# Patient Record
Sex: Male | Born: 1935 | Race: White | Hispanic: No | Marital: Married | Smoking: Former smoker
Health system: Southern US, Community
[De-identification: ages and names within clinical notes are randomized; demographics above are authoritative.]

## PROBLEM LIST (undated history)

## (undated) DIAGNOSIS — Z951 Presence of aortocoronary bypass graft: Secondary | ICD-10-CM

## (undated) DIAGNOSIS — I10 Essential (primary) hypertension: Secondary | ICD-10-CM

## (undated) DIAGNOSIS — E785 Hyperlipidemia, unspecified: Secondary | ICD-10-CM

## (undated) DIAGNOSIS — I2510 Atherosclerotic heart disease of native coronary artery without angina pectoris: Secondary | ICD-10-CM

## (undated) DIAGNOSIS — I4821 Permanent atrial fibrillation: Secondary | ICD-10-CM

## (undated) DIAGNOSIS — I350 Nonrheumatic aortic (valve) stenosis: Secondary | ICD-10-CM

## (undated) DIAGNOSIS — Z953 Presence of xenogenic heart valve: Principal | ICD-10-CM

## (undated) HISTORY — DX: Presence of aortocoronary bypass graft: Z95.1

## (undated) HISTORY — DX: Nonrheumatic aortic (valve) stenosis: I35.0

## (undated) HISTORY — DX: Atherosclerotic heart disease of native coronary artery without angina pectoris: I25.10

## (undated) HISTORY — DX: Permanent atrial fibrillation: I48.21

## (undated) HISTORY — DX: Hyperlipidemia, unspecified: E78.5

## (undated) HISTORY — DX: Essential (primary) hypertension: I10

## (undated) HISTORY — DX: Presence of xenogenic heart valve: Z95.3

---

## 2002-04-17 DIAGNOSIS — Z951 Presence of aortocoronary bypass graft: Secondary | ICD-10-CM

## 2002-04-17 DIAGNOSIS — I2510 Atherosclerotic heart disease of native coronary artery without angina pectoris: Secondary | ICD-10-CM

## 2002-04-17 HISTORY — DX: Atherosclerotic heart disease of native coronary artery without angina pectoris: I25.10

## 2002-04-17 HISTORY — DX: Presence of aortocoronary bypass graft: Z95.1

## 2002-12-11 ENCOUNTER — Encounter (INDEPENDENT_AMBULATORY_CARE_PROVIDER_SITE_OTHER): Payer: Self-pay | Admitting: Specialist

## 2002-12-11 ENCOUNTER — Encounter: Payer: Self-pay | Admitting: Emergency Medicine

## 2002-12-11 ENCOUNTER — Inpatient Hospital Stay (HOSPITAL_COMMUNITY): Admission: EM | Admit: 2002-12-11 | Discharge: 2002-12-26 | Payer: Self-pay | Admitting: *Deleted

## 2002-12-11 HISTORY — PX: CARDIAC CATHETERIZATION: SHX172

## 2002-12-15 ENCOUNTER — Encounter: Payer: Self-pay | Admitting: Cardiothoracic Surgery

## 2002-12-15 HISTORY — PX: CORONARY ARTERY BYPASS GRAFT: SHX141

## 2002-12-16 ENCOUNTER — Encounter: Payer: Self-pay | Admitting: Cardiothoracic Surgery

## 2002-12-17 ENCOUNTER — Encounter: Payer: Self-pay | Admitting: Cardiothoracic Surgery

## 2002-12-18 ENCOUNTER — Encounter: Payer: Self-pay | Admitting: Cardiothoracic Surgery

## 2003-02-16 ENCOUNTER — Encounter (HOSPITAL_COMMUNITY): Admission: RE | Admit: 2003-02-16 | Discharge: 2003-05-17 | Payer: Self-pay | Admitting: *Deleted

## 2003-03-14 ENCOUNTER — Emergency Department (HOSPITAL_COMMUNITY): Admission: EM | Admit: 2003-03-14 | Discharge: 2003-03-15 | Payer: Self-pay | Admitting: *Deleted

## 2004-12-12 ENCOUNTER — Emergency Department (HOSPITAL_COMMUNITY): Admission: EM | Admit: 2004-12-12 | Discharge: 2004-12-12 | Payer: Self-pay | Admitting: Emergency Medicine

## 2005-02-07 ENCOUNTER — Emergency Department (HOSPITAL_COMMUNITY): Admission: EM | Admit: 2005-02-07 | Discharge: 2005-02-07 | Payer: Self-pay | Admitting: Emergency Medicine

## 2009-01-03 ENCOUNTER — Emergency Department (HOSPITAL_COMMUNITY): Admission: EM | Admit: 2009-01-03 | Discharge: 2009-01-03 | Payer: Self-pay | Admitting: Emergency Medicine

## 2009-09-30 ENCOUNTER — Encounter: Admission: RE | Admit: 2009-09-30 | Discharge: 2009-09-30 | Payer: Self-pay | Admitting: Internal Medicine

## 2010-03-24 ENCOUNTER — Emergency Department (HOSPITAL_COMMUNITY): Admission: EM | Admit: 2010-03-24 | Discharge: 2009-08-25 | Payer: Self-pay | Admitting: Emergency Medicine

## 2010-07-05 LAB — POCT CARDIAC MARKERS
CKMB, poc: 1.8 ng/mL (ref 1.0–8.0)
CKMB, poc: 2.4 ng/mL (ref 1.0–8.0)
Myoglobin, poc: 75.8 ng/mL (ref 12–200)
Myoglobin, poc: 76.3 ng/mL (ref 12–200)
Troponin i, poc: <0.05 ng/mL (ref 0.00–0.09)
Troponin i, poc: <0.05 ng/mL (ref 0.00–0.09)

## 2010-07-05 LAB — DIFFERENTIAL
Basophils Absolute: 0 10*3/uL (ref 0.0–0.1)
Basophils Relative: 1 % (ref 0–1)
Eosinophils Absolute: 0.3 10*3/uL (ref 0.0–0.7)
Eosinophils Relative: 4 % (ref 0–5)
Lymphocytes Relative: 31 % (ref 12–46)
Lymphs Abs: 1.9 10*3/uL (ref 0.7–4.0)
Monocytes Absolute: 0.6 10*3/uL (ref 0.1–1.0)
Monocytes Relative: 9 % (ref 3–12)
Neutro Abs: 3.4 10*3/uL (ref 1.7–7.7)
Neutrophils Relative %: 55 % (ref 43–77)

## 2010-07-05 LAB — BASIC METABOLIC PANEL
BUN: 11 mg/dL (ref 6–23)
CO2: 27 mEq/L (ref 19–32)
Calcium: 9.3 mg/dL (ref 8.4–10.5)
Chloride: 91 mEq/L — ABNORMAL LOW (ref 96–112)
Creatinine, Ser: 0.89 mg/dL (ref 0.4–1.5)
GFR calc Af Amer: >60 mL/min (ref >60)
GFR calc non Af Amer: >60 mL/min (ref >60)
Glucose, Bld: 122 mg/dL — ABNORMAL HIGH (ref 70–99)
Potassium: 4.3 mEq/L (ref 3.5–5.1)
Sodium: 125 mEq/L — ABNORMAL LOW (ref 135–145)

## 2010-07-05 LAB — CBC
HCT: 38.5 % — ABNORMAL LOW (ref 39.0–52.0)
Hemoglobin: 13.1 g/dL (ref 13.0–17.0)
MCHC: 34.2 g/dL (ref 30.0–36.0)
MCV: 98.1 fL (ref 78.0–100.0)
Platelets: 160 10*3/uL (ref 150–400)
RBC: 3.93 MIL/uL — ABNORMAL LOW (ref 4.22–5.81)
RDW: 12.2 % (ref 11.5–15.5)
WBC: 6.3 10*3/uL (ref 4.0–10.5)

## 2010-07-05 LAB — PROTIME-INR
INR: 0.99 (ref 0.00–1.49)
Prothrombin Time: 13 seconds (ref 11.6–15.2)

## 2010-07-22 LAB — COMPREHENSIVE METABOLIC PANEL
ALT: 23 U/L (ref 0–53)
AST: 34 U/L (ref 0–37)
Albumin: 4.1 g/dL (ref 3.5–5.2)
Alkaline Phosphatase: 69 U/L (ref 39–117)
BUN: 8 mg/dL (ref 6–23)
CO2: 26 mEq/L (ref 19–32)
Calcium: 9.6 mg/dL (ref 8.4–10.5)
Chloride: 94 mEq/L — ABNORMAL LOW (ref 96–112)
Creatinine, Ser: 0.75 mg/dL (ref 0.4–1.5)
GFR calc Af Amer: >60 mL/min (ref >60)
GFR calc non Af Amer: >60 mL/min (ref >60)
Glucose, Bld: 113 mg/dL — ABNORMAL HIGH (ref 70–99)
Potassium: 4.1 mEq/L (ref 3.5–5.1)
Sodium: 128 mEq/L — ABNORMAL LOW (ref 135–145)
Total Bilirubin: 1.1 mg/dL (ref 0.3–1.2)
Total Protein: 7.1 g/dL (ref 6.0–8.3)

## 2010-07-22 LAB — CBC
HCT: 40.7 % (ref 39.0–52.0)
Hemoglobin: 14 g/dL (ref 13.0–17.0)
MCHC: 34.5 g/dL (ref 30.0–36.0)
MCV: 97.8 fL (ref 78.0–100.0)
Platelets: 189 10*3/uL (ref 150–400)
RBC: 4.16 MIL/uL — ABNORMAL LOW (ref 4.22–5.81)
RDW: 12.4 % (ref 11.5–15.5)
WBC: 8.8 10*3/uL (ref 4.0–10.5)

## 2010-07-22 LAB — POCT CARDIAC MARKERS
CKMB, poc: 4.8 ng/mL (ref 1.0–8.0)
CKMB, poc: 6.3 ng/mL (ref 1.0–8.0)
Myoglobin, poc: 114 ng/mL (ref 12–200)
Myoglobin, poc: 133 ng/mL (ref 12–200)
Troponin i, poc: <0.05 ng/mL (ref 0.00–0.09)
Troponin i, poc: <0.05 ng/mL (ref 0.00–0.09)

## 2010-07-22 LAB — URINALYSIS, ROUTINE W REFLEX MICROSCOPIC
Bilirubin Urine: NEGATIVE
Glucose, UA: NEGATIVE mg/dL
Hgb urine dipstick: NEGATIVE
Ketones, ur: NEGATIVE mg/dL
Nitrite: NEGATIVE
Protein, ur: NEGATIVE mg/dL
Specific Gravity, Urine: 1.004 — ABNORMAL LOW (ref 1.005–1.030)
Urobilinogen, UA: 0.2 mg/dL (ref 0.0–1.0)
pH: 7 (ref 5.0–8.0)

## 2010-07-22 LAB — DIFFERENTIAL
Basophils Absolute: 0 10*3/uL (ref 0.0–0.1)
Basophils Relative: 1 % (ref 0–1)
Eosinophils Absolute: 0.1 10*3/uL (ref 0.0–0.7)
Eosinophils Relative: 1 % (ref 0–5)
Lymphocytes Relative: 15 % (ref 12–46)
Lymphs Abs: 1.3 10*3/uL (ref 0.7–4.0)
Monocytes Absolute: 0.5 10*3/uL (ref 0.1–1.0)
Monocytes Relative: 6 % (ref 3–12)
Neutro Abs: 6.9 10*3/uL (ref 1.7–7.7)
Neutrophils Relative %: 78 % — ABNORMAL HIGH (ref 43–77)

## 2010-07-22 LAB — BRAIN NATRIURETIC PEPTIDE: Pro B Natriuretic peptide (BNP): 67 pg/mL (ref 0.0–100.0)

## 2010-09-02 NOTE — Op Note (Signed)
NAME:  Richard Simon, Richard Simon                              ACCOUNT NO.:  [id number]   MEDICAL RECORD NO.:  [id number]                   PATIENT TYPE:  INP   LOCATION:  2001                                 FACILITY:  [facility]   PHYSICIAN:  Gwenith Daily. Tyrone Sage, M.D.            DATE OF BIRTH:  January 12, 1930   DATE OF PROCEDURE:  12/15/2002  DATE OF DISCHARGE:                                 OPERATIVE REPORT   PREOPERATIVE DIAGNOSES:  Coronary occlusive disease.   POSTOPERATIVE DIAGNOSES:  Coronary occlusive disease.   OPERATION PERFORMED:  Coronary artery bypass grafting times four with a left  internal mammary artery to the left anterior descending coronary artery,  reversed saphenous vein graft to the diagonal coronary artery, sequential  reversed saphenous vein graft to the first obtuse marginal and distal  circumflex with right Endo vein harvesting.   SURGEON:  Gwenith Daily. Tyrone Sage, M.D.   ASSISTANT:  1. Salvatore Decent. Cornelius Moras, M.D.  2. Rowe Clack, P.A.-C.   ANESTHESIA:  General.   INDICATIONS FOR PROCEDURE:  The patient is a 77 year old male who has been  healthy until he had the onset of chest discomfort after heavy physical work  with pain that subsided and then returned. He sought medical attention.  EKG  showed ischemic changes and he was admitted through the emergency room.  Troponins were elevated.  The patient underwent cardiac catheterization by  Aram Candela. Tysinger, M.D., which demonstrated significant three-vessel disease  with a small nondominant right system that was totally occluded, dominant  left system with 70 to 80% stenosis of the first obtuse marginal and disease  in the distal circumflex which also supplied a small posterior descending  branch.  In addition, he had a complex lesion involving the LAD and diagonal  of greater than 80%.  Because of the patient's significant three-vessel  disease, coronary artery bypass grafting was recommended.  The patient  agreed and signed  informed consent.   DESCRIPTION OF PROCEDURE:  With Swann-Ganz and arterial line monitors in  place, the patient underwent general endotracheal anesthesia without  incident.  The skin of the chest and legs was prepped with Betadine and  draped in the usual sterile manner.  Using a [employer] Endo vein harvesting  system, vein was harvested from the right thigh and was of good quality and  caliber.  A median sternotomy was performed.  The left internal mammary  artery was dissected down as a pedicle graft.  The distal artery was divided  and had good free flow.  The pericardium was opened.  Overall ventricular  function appeared preserved.  The patient was systemically heparinized.  The  ascending aorta and the right atrium were cannulated in the aortic root.  A  bent cardioplegia needle was introduced into the ascending aorta.  The  patient was placed on cardiopulmonary bypass at 2.4L per minute per meter  squared.  Sites for  anastomosis were selected and dissected out of the  epicardium.  The patient's body temperature was cooled to 30 degrees.  An  aortic crossclamp was applied.  of cold blood potassium cardioplegia  was administered with rapid diastolic arrest of the heart.  Myocardial  septal temperature was monitored throughout the crossclamp period.   Attention was turned first to the obtuse marginal vessel which was opened  and admitted a 1.5 mm probe. The vessel was just barely 1.5 mm in size.  Using a diamond type side-to-side anastomosis was carried out.  The distal  extent of the same vein was then carried to the distal circumflex branch  which was the larger of the more distal branches. The posterior descending  was identified but was smaller.  Using a running 7-0 Prolene, distal  anastomosis was performed.  Attention was then turned to the diagonal  coronary artery which was a 1.5 mm vessel.  Using a running 7-0 Prolene,  distal anastomosis was performed with a segment of  reversed saphenous vein  graft.  Attention was then turned to the left anterior descending coronary  artery, which was approximately 1.3 to 1.4 mm size distally.  Using a  running 8-0 Prolene, the left internal mammary artery was anastomosed to a  left anterior descending coronary artery.  With release of the Edwards  bulldog on the mammary artery, there was appropriate rise in myocardial  septal temperature.  Aortic cross-clamp was removed.  Total cross-clamp time  was 64 minutes.  The patient required electrical defibrillation to return to  a sinus rhythm.  A partial occlusion clamp was placed on the ascending  aorta.  Two punch aortotomies were performed and each of the two vein grafts  were anastomosed to the ascending aorta.  Air was evacuated from the grafts  and the partial occlusion clamp was removed.  The sites of anastomosis were  inspected and were free of bleeding.  The patient was then ventilated and  weaned from cardiopulmonary bypass without difficulty.  He remained  hemodynamically stable, was decannulated in the usual fashion.  Protamine  sulfate was administered.  With the operative field hemostatic, two atrial  and two ventricular pacing wires were applied.  Graft markers were applied.  A left pleural tube and two mediastinal tubes were left in place.  Sternum  was closed with #6 stainless steel wire.  Fascia closed with interrupted 0  Vicryl, running 3-0 Vicryl in the subcutaneous tissues and 4-0 subcuticular  stitch in the skin edges.  Dry dressings were applied.  Sponge and needle  counts were reported as correct at the completion of the procedure.  The  patient tolerated the procedure without obvious complication and was  transferred to the surgical intensive care unit for further postoperative  care.                                                 Gwenith Daily Tyrone Sage, M.D.    Tyson Babinski  D:  12/16/2002  T:  12/17/2002  Job:  [id number]  cc:   Aram Candela. Aleen Campi,  M.D.  [address]  [place]  [place] [address]  Fax: [phone]

## 2010-09-02 NOTE — Discharge Summary (Signed)
NAME:  Richard Simon, Richard Simon                              ACCOUNT NO.:  [id number]   MEDICAL RECORD NO.:  [id number]                   PATIENT TYPE:  INP   LOCATION:  2001                                 FACILITY:  [facility]   PHYSICIAN:  Gwenith Daily. Tyrone Sage, M.D.            DATE OF BIRTH:  Jan 28, 1930   DATE OF ADMISSION:  12/11/2002  DATE OF DISCHARGE:  12/26/2002                                 DISCHARGE SUMMARY   HISTORY OF PRESENT ILLNESS:  This is a 77 year old male with a history of  hypertension and hypercholesterolemia, but no prior history of coronary  artery disease, who presented to his primary care physician's office after  having experienced and episode of midsternal chest tightness.  This event  followed a late evening heavy meal.  He has stated that it was accompanied  with shortness of breath, some diaphoresis, and lasted about an hour and  subsided.  After approximately one hour, the patient then felt back to  normal.  He presented to his primary care physician and was found to have  atrial fibrillation with a rapid rate and was subsequently transferred to  [facility]  He was pain-free, hemodynamically stable, and in no  acute distress on arrival.  EKG showed atrial fibrillation with rapid  ventricular response, no evidence of acute ST segment elevations, and no  evidence of a prior infarction.  The patient was found to have elevation in  his troponin I to 4.23 and CK to 819 with an MB band fraction of 42.7.  He  was started on a Cardizem drip and a cardiology consultation was obtained  with Dr. Aleen Campi.  He was felt to require admission for further evaluation  and treatment.   PAST MEDICAL HISTORY:  1. Hypertension.  2. Hypercholesterolemia.  3. No prior surgical history.   MEDICATIONS ON ADMISSION:  1. Atenolol 50 mg daily.  2. Prinivil 40 mg daily.  3. Lipitor 20 mg daily.  4. Aspirin 81 mg daily.  5. Hydrochlorothiazide 25 mg daily.  6. Vitamin E 400  international units daily.  7. B complex vitamins one daily.   ALLERGIES:  None known.   FAMILY HISTORY:  Please see the history and physical done at the time of  admission.   SOCIAL HISTORY:  Please see the history and physical done at the time of  admission.   REVIEW OF SYSTEMS:  Please see the history and physical done at the time of  admission.   PHYSICAL EXAMINATION:  Please see the history and physical done at the time  of admission.   HOSPITAL COURSE:  The patient was admitted on December 11, 2002.  He was taken  to the catheterization laboratory by Dr. Aleen Campi where he was found to have  severe three-vessel coronary artery disease, including a 99% right coronary  artery stenosis, a 99% obtuse marginal, a 90% mid circumflex, a 90% distal  circumflex, an 80% mid LAD, and a 50% distal LAD lesion.  The patient had a  left renal artery stenosis also of 70-80%.  The ejection fraction was noted  to be 70%.  Due to these findings, a surgical consultation was obtained with  Ramon Dredge B. Tyrone Sage, M.D., who evaluated the patient and studies and agreed  with recommendations to proceed with surgical revascularization.   PROCEDURE:  On December 15, 2002, the patient was taken to the operating room  where he underwent the following procedure:  Coronary artery bypass grafting  x 4.  The patient tolerated the procedure well and was taken to the surgical  intensive care unit in stable condition.   POSTOPERATIVE HOSPITAL COURSE:  The patient has done well.  He was extubated  neurologically intact.  All routine lines, monitors, and drainage devices  were discontinued in a standard fashion.  The patient did have a mild  postoperative anemia.  The primary difficulty postoperatively the patient  had was postoperative atrial fibrillation.  The patient did chemically  cardiovert to a normal sinus rhythm for a short time, however, did  eventually return to intermittent atrial fibrillation and this  prompted the  initiation of anticoagulation therapy.  The patient did subsequently  maintain an normal sinus rhythm at the time of discharge.  The patient  tolerated routine advancement in activities commiserative for level of  postoperative convalescence using cardiac rehabilitation phase 1 modalities.  The patient tolerated a gentle diuresis.  The patient's incisions were  healing well without signs of infection.  The patient was tolerating diet.  Oxygen had been weaned and maintained good saturations on room air.  Overall  the patient was deemed to be an acceptable candidate for discharge on  December 26, 2002.   CONDITION ON DISCHARGE:  Stable and improving.   MEDICATIONS ON DISCHARGE:  1. Coumadin 5 mg daily.  2. Coated aspirin 81 mg daily.  3. Amiodarone 200 mg twice daily.  4. Prinivil as preoperatively.  5. Lanoxin 0.25 mg daily.  6. Cardizem CD 360 mg daily.  7. Lipitor 20 mg daily.  8. Atenolol 50 mg daily for pain.  9. Tylox one or two every four to six hours as needed for pain.   INSTRUCTIONS:  The patient received written instructions in regard to  medications, activity, diet, wound care, and followup.   FOLLOWUP:  Dr. Aleen Campi in two weeks.  PT INR drawn on December 29, 2002,  at Dr. Adelene Idler office.  Additionally, Dr. Tyrone Sage will see the patient  on Thursday, January 16, 2003, at 11:15 a.m.   FINAL DIAGNOSES:  1. Severe coronary artery disease as described above.  2. Postoperative atrial fibrillation.  3. Hypertension.  4. Hyperlipidemia.      Rowe Clack, P.A.-C.                    Gwenith Daily Tyrone Sage, M.D.    Sherryll Burger  D:  03/24/2003  T:  03/25/2003  Job:  [id number]   cc:   Georgianne Fick, M.D.  [address]  [place]  [place] [address]  Fax: [phone]

## 2010-09-02 NOTE — H&P (Signed)
NAME:  Richard Simon, Richard Simon                              ACCOUNT NO.:  [id number]   MEDICAL RECORD NO.:  [id number]                   PATIENT TYPE:  INP   LOCATION:  2926                                 FACILITY:  [facility]   PHYSICIAN:  Hettie Holstein, D.O.                 DATE OF BIRTH:  12/17/1929   DATE OF ADMISSION:  12/11/2002  DATE OF DISCHARGE:                                HISTORY & PHYSICAL   ENCOMPASS HOSPITALIST:  Hettie Holstein, D.O.   PRIMARY CARE PHYSICIAN:  Georgianne Fick, M.D. with [facility]   CHIEF COMPLAINT:  Atrial fibrillation/chest pain.   HISTORY OF PRESENT ILLNESS:  This is a 77 year old pleasant Caucasian male  with a history of hypertension and hypercholesterolemia, with no prior known  history of coronary artery disease who presented to his primary care  physician's office today after having experienced midsternal chest tightness  this prior evening approximately 10 p.m. following an late evening heavy  meal.  He states that this was accompanied with some shortness of breath, a  little diaphoresis, lasted about an hour in duration, and subsided.  He  stated that after an hour he felt back to normal.  He had checked his blood  pressure secondary to some irregularity with regard to the blood pressure  reading.  His son prompted him to see his primary care physician this  morning.  At his primary care physician's office he was found to have atrial  fibrillation with a rapid rate and was subsequently transferred to [facility].  He was pain free, hemodynamically stable, and in no acute  distress on arrival.  EKG was performed, and in fact it was atrial  fibrillation with rapid ventricular rate, no evidence of acute ST segment  elevations, and no evidence of prior infarct was noted on the EKG.  In any  event, in the emergency department he was evaluated, cardiac enzymes  returned elevated troponin.  This was drawn at approximately 1 p.m.,  and  troponin-I was 4.23, and CK total was 819, and MB fraction was 42.7.  He was  found to have a rapid heart rate and he was started on a Cardizem drip by  the emergency department as well as heparin, and Dr. Aleen Campi from  [facility] was notified of the lab findings as well as  the EKG, and the patient was said to be transferred to a telemetry floor.   PAST MEDICAL HISTORY:  1. Hypertension.  2. Hypercholesterolemia.  3. States that he had not followed with a physician for many years up until     2000, when he began to see Dr. Nicholos Johns.   PAST SURGICAL HISTORY:  The patient denies.   MEDICATIONS:  1. Atenolol 50 mg p.o. q.d.  2. Prinivil 40 mg p.o. q.d.  3. Lipitor 20 mg p.o. q.d.  4. Aspirin  81 mg p.o. q.d.  5. Hydrochlorothiazide 25 mg p.o. q.d.  6. Vitamin E 400 IU q. day.  7. B complex vitamin q. day.   ALLERGIES:  No known drug allergies.   SOCIAL HISTORY:  He quit smoking over 20 years ago.  Lives with his wife.  Is very active and travels regularly.  Exercises.   FAMILY HISTORY:  Significant for a father with severe rheumatoid arthritis,  otherwise, no other family history that was pertinent.   REVIEW OF SYSTEMS:  GENERAL:  The patient denied any recent weight loss.  Denied fevers, chills, night sweats, or change in appetite.  HEENT:  Denied  any visual, auditory, olfactory, or visual disturbances.  CHEST:  The  patient denies any further palpitations.  CARDIOVASCULAR:  Noted in the  history of presenting illness, however, denied any dyspnea on exertion,  orthopnea, PND, lower extremity edema.  PULMONARY:  The patient denied any  history of asthma, history of lung disease, history of hemoptysis or cough  productive of sputum.  GASTROINTESTINAL:  The patient denied any nausea,  vomiting, diarrhea, hematemesis, hematochezia, melena, coffee ground emesis,  or abdominal complaints.  GENITOURINARY:  The patient denied any dysuria,  discharge.   Denied any history of prostatic hypertrophy.  HEMATOLOGIC:  The  patient denies any prior history of anemia or bleeding disorders.  INFECTIOUS DISEASE:  No fevers, no chills, no night sweats.  MUSCULOSKELETAL:  The patient denies joint aches, joint pains, joint  swelling, lower extremity swelling or pain, or back pain.   PHYSICAL EXAMINATION:  VITAL SIGNS:  The patient's blood pressure was 135/87  supine, his heart rate was irregular at 100 to 120.  Telemetry revealed  occasional runs of VT, though the patient was asymptomatic with these  episodes with blood pressure stable.  The patient was afebrile with an oral  temperature of 98.5.  GENERAL:  He appeared comfortable.  In no acute distress.  Well-developed  Caucasian male in no respiratory distress.  HEENT:  His head was normocephalic, atraumatic.  Extraocular movements were  intact.  Pupils equal and reactive to light.  No pallor.  The patient was  nonicteric.  NECK:  Supple, no tenderness, no palpable thyromegaly, no tracheal  deviation.  CARDIOVASCULAR:  Heart was irregular, no murmurs could be appreciated to  auscultation.  PULMONARY:  Faint crackles noted in the bases bilaterally.  No wheeze noted.  ABDOMEN:  Soft, nontender, no palpable organomegaly, no palpable tenderness.  EXTREMITIES:  No edema.  Peripheral pulses were strong.  NEUROLOGIC:  No focal neurologic deficits noted.   LABORATORY DATA:  WBC 9.9, hemoglobin 14.5, hematocrit 41.9, platelet count  of 199.  Sodium of 131, potassium 3.8, chloride 97, CO2 26, glucose 127, BUN  10, creatinine 0.9, calcium 9.5, albumin 3.9, AST 56, ALT 31, alk phos 71,  bilirubin total 1.1.  CK total of 819, CK-MB 42.7, troponin-I of 4.23.  EKG  once again revealed rapid atrial fibrillation with no evidence of ST segment  elevation.  Chest x-ray revealed no evidence of mediastinal widening and no  evidence of pulmonary edema.   IMPRESSION AND PLAN: 1. Acute coronary syndrome, perhaps  rate related with underlying coronary     artery disease.  Perhaps acute ischemic event.  We will place Mr. Gladish     on heparin.  Cardiac nomogram.  Start him on aspirin, beta blocker,     continue his ACE inhibitor, continue his Lipitor, and consult cardiology,     and monitor  him on a telemetry floor.  We are going to attempt to control     his rate with beta blockers, and if needed, a Cardizem drip until his     rate can be better controlled.  We will watch him closely     hemodynamically, and follow his serial cardiac enzymes.  2. Rapid atrial fibrillation.  As above.  We will attempt rate control and     anticoagulation for now.  We will increase his daily atenolol.  3. Hypertension.  The patient's blood pressure is controlled at this time.     We will continue his current regimen from home.  4. Hypercholesterolemia.  We will continue his statin.  5. Hyponatremia.  Repeat BMP in the morning, follow him clinically, and     perhaps fluid restrict if this continues to be persistent.  At present,     he is hemodynamically stable and chest pain free.  I have notified Dr.     Adelene Idler office and awaiting his input.                                                Hettie Holstein, D.O.    ESS/MEDQ  D:  12/11/2002  T:  12/12/2002  Job:  [id number]

## 2010-12-27 ENCOUNTER — Inpatient Hospital Stay (HOSPITAL_COMMUNITY)
Admission: EM | Admit: 2010-12-27 | Discharge: 2010-12-29 | DRG: 310 | Disposition: A | Discharge status: Home / Self Care | Payer: Medicare Other | Source: Ambulatory Visit | Attending: Cardiovascular Disease | Admitting: Cardiovascular Disease

## 2010-12-27 ENCOUNTER — Emergency Department (HOSPITAL_COMMUNITY): Payer: Medicare Other

## 2010-12-27 DIAGNOSIS — Z87891 Personal history of nicotine dependence: Secondary | ICD-10-CM

## 2010-12-27 DIAGNOSIS — I080 Rheumatic disorders of both mitral and aortic valves: Secondary | ICD-10-CM | POA: Diagnosis present

## 2010-12-27 DIAGNOSIS — Z7982 Long term (current) use of aspirin: Secondary | ICD-10-CM

## 2010-12-27 DIAGNOSIS — E785 Hyperlipidemia, unspecified: Secondary | ICD-10-CM | POA: Diagnosis present

## 2010-12-27 DIAGNOSIS — I2510 Atherosclerotic heart disease of native coronary artery without angina pectoris: Secondary | ICD-10-CM | POA: Diagnosis present

## 2010-12-27 DIAGNOSIS — I4891 Unspecified atrial fibrillation: Principal | ICD-10-CM | POA: Diagnosis present

## 2010-12-27 DIAGNOSIS — Z951 Presence of aortocoronary bypass graft: Secondary | ICD-10-CM

## 2010-12-27 DIAGNOSIS — I10 Essential (primary) hypertension: Secondary | ICD-10-CM | POA: Diagnosis present

## 2010-12-27 LAB — CBC
HCT: 40.2 % (ref 39.0–52.0)
Hemoglobin: 14.4 g/dL (ref 13.0–17.0)
MCH: 32.8 pg (ref 26.0–34.0)
MCHC: 35.8 g/dL (ref 30.0–36.0)
MCV: 91.6 fL (ref 78.0–100.0)
Platelets: 192 10*3/uL (ref 150–400)
RBC: 4.39 MIL/uL (ref 4.22–5.81)
RDW: 12.4 % (ref 11.5–15.5)
WBC: 8.4 10*3/uL (ref 4.0–10.5)

## 2010-12-27 LAB — DIFFERENTIAL
Basophils Absolute: 0.1 10*3/uL (ref 0.0–0.1)
Basophils Relative: 1 % (ref 0–1)
Eosinophils Absolute: 0.3 10*3/uL (ref 0.0–0.7)
Eosinophils Relative: 4 % (ref 0–5)
Lymphocytes Relative: 36 % (ref 12–46)
Lymphs Abs: 3 10*3/uL (ref 0.7–4.0)
Monocytes Absolute: 0.8 10*3/uL (ref 0.1–1.0)
Monocytes Relative: 9 % (ref 3–12)
Neutro Abs: 4.2 10*3/uL (ref 1.7–7.7)
Neutrophils Relative %: 51 % (ref 43–77)

## 2010-12-27 LAB — COMPREHENSIVE METABOLIC PANEL
ALT: 15 U/L (ref 0–53)
AST: 19 U/L (ref 0–37)
Albumin: 3.9 g/dL (ref 3.5–5.2)
Alkaline Phosphatase: 65 U/L (ref 39–117)
CO2: 26 mEq/L (ref 19–32)
Chloride: 94 mEq/L — ABNORMAL LOW (ref 96–112)
GFR calc non Af Amer: >60 mL/min (ref >60)
Potassium: 4.1 mEq/L (ref 3.5–5.1)
Total Bilirubin: 0.5 mg/dL (ref 0.3–1.2)

## 2010-12-27 LAB — APTT: aPTT: 35 seconds (ref 24–37)

## 2010-12-27 LAB — PROTIME-INR: INR: 1.05 (ref 0.00–1.49)

## 2010-12-28 LAB — URINALYSIS, ROUTINE W REFLEX MICROSCOPIC
Glucose, UA: NEGATIVE mg/dL
Hgb urine dipstick: NEGATIVE
Leukocytes, UA: NEGATIVE
Protein, ur: NEGATIVE mg/dL
Specific Gravity, Urine: 1.008 (ref 1.005–1.030)
pH: 7 (ref 5.0–8.0)

## 2010-12-28 LAB — LIPID PANEL
Cholesterol: 150 mg/dL (ref 0–200)
Total CHOL/HDL Ratio: 2.7 RATIO
Triglycerides: 45 mg/dL (ref <150)

## 2010-12-28 LAB — CBC
Hemoglobin: 13.6 g/dL (ref 13.0–17.0)
MCHC: 35.3 g/dL (ref 30.0–36.0)
RBC: 4.23 MIL/uL (ref 4.22–5.81)
WBC: 9.3 10*3/uL (ref 4.0–10.5)

## 2010-12-28 LAB — TSH: TSH: 1.997 u[IU]/mL (ref 0.350–4.500)

## 2010-12-28 LAB — MRSA PCR SCREENING: MRSA by PCR: NEGATIVE

## 2010-12-28 LAB — HEPARIN LEVEL (UNFRACTIONATED): Heparin Unfractionated: 0.35 IU/mL (ref 0.30–0.70)

## 2010-12-29 LAB — CBC
Hemoglobin: 12.7 g/dL — ABNORMAL LOW (ref 13.0–17.0)
MCV: 92 fL (ref 78.0–100.0)
Platelets: 179 10*3/uL (ref 150–400)
RBC: 4.01 MIL/uL — ABNORMAL LOW (ref 4.22–5.81)
WBC: 6.8 10*3/uL (ref 4.0–10.5)

## 2010-12-29 LAB — HEPARIN LEVEL (UNFRACTIONATED): Heparin Unfractionated: 0.42 IU/mL (ref 0.30–0.70)

## 2010-12-29 LAB — BASIC METABOLIC PANEL
BUN: 11 mg/dL (ref 6–23)
Chloride: 100 mEq/L (ref 96–112)
GFR calc Af Amer: >60 mL/min (ref >60)
Potassium: 4.1 mEq/L (ref 3.5–5.1)

## 2011-01-15 NOTE — Discharge Summary (Signed)
NAME:  Richard Simon, Richard Simon                  ACCOUNT NO.:  [id number]  MEDICAL RECORD NO.:  [id number]  LOCATION:  3702                         FACILITY:  [facility]  PHYSICIAN:  Nanetta Batty, M.D.   DATE OF BIRTH:  21-Nov-1929  DATE OF ADMISSION:  12/27/2010 DATE OF DISCHARGE:  12/29/2010                              DISCHARGE SUMMARY   DISCHARGE DIAGNOSES: 1. Atrial fibrillation with rapid ventricular response, now rate     controlled, atrial fibrillation with medications,and the patient     prefers not to undergo cardioversion or ablation at this time. 2. History of coronary artery disease with bypass grafting x4 in 2004. 3. Remote paroxysmal atrial fibrillation after Richard Simon bypass grafting. 4. Anticoagulation with Pradaxa. 5. Hypertension treated. 6. Dyslipidemia treated. 7. Grade 1 diastolic dysfunction on 2D echocardiogram. 8. Moderate aortic stenosis and moderate mitral regurgitation.  DISCHARGE CONDITION:  Improved and stable.  DISCHARGE MEDICATIONS:  See medication reconciliation sheet.  We added Pradaxa and Cardizem.  DISCHARGE INSTRUCTIONS: 1. Activity as tolerated.  Increase activity slowly. 2. Low-sodium heart-healthy diet. 3. Call if any bleeding or feeling faint. 4. Follow up with Dr. Herbie Baltimore at [facility], [phone] on January 16, 2011 and 4:15 p.m.  HOSPITAL COURSE:  An 77 year old gentleman who had previously been followed by Dr. Aleen Campi with bypass grafting in 2004 with LIMA to the LAD, vein graft to the diagonal, vein graft to the OM and circumflex sequentially.  He did have postop AFib at that time and he had been on Coumadin for a while, but that has been discontinued some time ago.  The patient came into the emergency room on December 27, 2010, because Richard Simon heart rate was high at times up to 130-140 that was intermittent and had been going on 4-5 days prior to admission.  In the emergency room, he was found to be in AFib with heart rate of  139.  IV diltiazem was given. He denied chest pain or shortness of breath.  Other history includes hypertension, dyslipidemia.  He was admitted to [facility].  Negative myocardial infarction.  Rate was controlled with IV Cardizem and by December 29, 2010, Richard Simon IV Cardizem was changed to p.o. Cardizem.  He will ambulate in the hallway if heart rate stable.  He can be discharged home.  He is having some slower heart rate to the combination of beta- blocker and Cardizem and the AFib.  Please note, the patient did not wish to be on Coumadin again.  The patient will need a stress test as an outpatient.  LABORATORY DATA:  Chemistry at discharge, sodium 135, potassium 4.1, chloride 100, CO2 of 25, glucose 115, BUN 11, creatinine 0.82. Hemoglobin 12.7, hematocrit 36.9, WBC 6.8, platelets 179.  Hemoglobin A1c 5.7.  TSH 1.997.  Magnesium level is 2.  Total cholesterol 150, triglycerides 45, HDL 56, LDL 85.  UA was clear and cardiac enzymes were negative with a CK 193, MB 5.3, and troponin I less than 0.30.  Also please note that Richard Simon LFTs were normal.  RADIOLOGY:  Chest x-ray, mild peribronchial thickening noted.  Lungs otherwise clear.  2D echo as previously reported.  EF  was 55-60% with a grade 1 diastolic dysfunction, moderate aortic stenosis with a valve area of 1.180 cm2.  By VTI, mitral valve had mild-to-moderate regurgitation.  Left atrium was moderately to severely dilated, and the right atrium was mildly to moderately dilated.  EKGs revealed AFib with rapid ventricular response, now rate controlled. Initial EKG actually has a heart rate of 157.  The patient will follow up as instructed.     Darcella Gasman. Annie Paras, N.P.   ______________________________ Nanetta Batty, M.D.    LRI/MEDQ  D:  12/29/2010  T:  12/29/2010  Job:  [id number]  cc:   Georgianne Fick, M.D.  Electronically Signed by Nada Boozer N.P. on 12/30/2010 04:13:55 PM Electronically Signed by Nanetta Batty M.D. on  01/15/2011 11:44:12 AM

## 2011-01-15 NOTE — H&P (Signed)
NAME:  Richard Simon, Richard Simon                  ACCOUNT NO.:  [id number]  MEDICAL RECORD NO.:  [id number]  LOCATION:  [facility]                         FACILITY:  [facility]  PHYSICIAN:  Nanetta Batty, M.D.   DATE OF BIRTH:  [birth year]  DATE OF ADMISSION:  12/27/2010 DATE OF DISCHARGE:                             HISTORY & PHYSICAL   CHIEF COMPLAINT:  Rapid heart rate.  HISTORY OF PRESENT ILLNESS:  Richard Simon is a 77 year old male who used to be followed by Dr. Aleen Campi.  He had bypass grafting in August 2004 x4 with LIMA to the LAD and SVG to diagonal, and SVG to the OM and circumflex sequentially.  He had postoperative paroxysmal atrial fibrillation.  He was on Coumadin for a while.  He was followed by Dr. Aleen Campi, for years.  He did see Dr. Allyson Sabal, last year and was set up to have a Myoview in the office, but never got this done.  He has not had chest pain or unusual shortness of breath.  He is noted his heart rate has been high at times, as high as 130-140.  This has been intermittent. This has been going on for the last 4-5 days.  Today, he noted his heart rate again was 139.  He came to the emergency room and is noted to be in rapid atrial fibrillation.  This improved with diltiazem.  He is not having chest pain or shortness of breath.  PAST MEDICAL HISTORY:  Remarkable for coronary artery disease as noted above.  He has treated hypertension..  He has a history of PAF after his bypass.  He has treated dyslipidemia.  He has had no major surgery besides this bypass.  CURRENT MEDICATIONS: 1. Amlodipine 5 mg a day, although he admits he has not been taking     this because of headaches. 2. Fish oil 1200 mg 3 times a day. 3. Crestor 5 mg daily. 4. Over-the-counter vitamins. 5. Atenolol 50 mg b.i.d. 6. Aspirin 81 mg a day. 7. Lisinopril 40 mg a day. 8. Lasix 20 mg a day.  ALLERGIES:  He has no known drug allergies.  SOCIAL HISTORY:  He is married, lives with wife and son, and they  are currently at the [place].  He quit smoking 30 years ago.  He does drink at least two glasses of wine at night.  FAMILY HISTORY:  Unremarkable for coronary artery disease, both his parents lived well into their 7s.  REVIEW OF SYSTEMS:  Essentially unremarkable except as noted above.  He had an ulcer as a teenager, but has never had GI bleeding or melena.  He has no history of thyroid disease.  PHYSICAL EXAMINATION:  VITAL SIGNS:  Blood pressure 129/86, pulse 113, and respirations 16. GENERAL:  He is a well-developed, well-nourished male in no acute distress. HEENT:  Normocephalic and atraumatic.  Extraocular movements intact. Sclerae nonicteric.  Lids and conjunctivae within normal limits. NECK:  Without JVD or bruit. CHEST:  Clear to auscultation and percussion. CARDIAC:  Regular rate and rhythm without obvious murmur, rub, or gallop.  Normal S1 and S2. ABDOMEN:  Nontender.  No hepatosplenomegaly. EXTREMITIES:  Without edema.  Distal pulses are intact  and no bruits noted. NEURO:  Grossly intact.  He is awake, alert and oriented, cooperative. Moves all extremities without obvious deficit. SKIN:  Cool and dry.  LABORATORY DATA:  Sodium 130, potassium 4.1, BUN 9, creatinine 0.84, INR 1.05.  CK is 193 with an MB of 5.3, troponin is negative.  Chest x-ray shows mild peribronchial thickening, otherwise clear lung fields.  EKG shows atrial fibrillation with rapid ventricular response and interventricular conduction delay, incomplete right bundle branch block.  IMPRESSION: 1. Atrial fibrillation with rapid ventricular response of unclear     duration. 2. Coronary artery disease with coronary artery bypass grafting x4 in     2004. 3. Remote PAF after his bypass. 4. Treated hypertension. 5. Treated dyslipidemia.  PLAN:  The patient will be admitted to telemetry, started on IV diltiazem.  It will be set up for a [employer].  We will also put him on heparin.     Abelino Derrick, P.A.   ______________________________ Nanetta Batty, M.D.    Lenard Lance  D:  12/27/2010  T:  12/27/2010  Job:  [id number]  cc:   Georgianne Fick, M.D.  Electronically Signed by Corine Shelter P.A. on 01/04/2011 02:10:44 PM Electronically Signed by Nanetta Batty M.D. on 01/15/2011 11:44:15 AM

## 2011-03-18 HISTORY — PX: OTHER SURGICAL HISTORY: SHX169

## 2012-09-11 ENCOUNTER — Telehealth: Payer: Self-pay | Admitting: Cardiology

## 2012-09-11 NOTE — Telephone Encounter (Signed)
Returned call and spoke w/ Carney Bern, pt's wife.  Informed samples at front desk.  Agreed to inform pt.

## 2012-09-11 NOTE — Telephone Encounter (Signed)
Would like some samples of Prodaxa 150mg  please!

## 2012-09-12 ENCOUNTER — Other Ambulatory Visit: Payer: Self-pay | Admitting: Cardiology

## 2012-09-12 NOTE — Telephone Encounter (Signed)
Refills sent to pharmacy. 

## 2012-11-05 ENCOUNTER — Other Ambulatory Visit: Payer: Self-pay | Admitting: Cardiology

## 2012-11-25 ENCOUNTER — Telehealth (HOSPITAL_COMMUNITY): Payer: Self-pay | Admitting: Cardiology

## 2012-11-25 DIAGNOSIS — I350 Nonrheumatic aortic (valve) stenosis: Secondary | ICD-10-CM

## 2012-11-25 NOTE — Telephone Encounter (Signed)
Per Dr Elissa Hefty office visit note from 04/25/12, Mr Truex needs an echo prior to next office visit in Sept or Oct.  I placed an order for an echo.  Samples of Pradaxa at front desk - 4 packs [id number] 6/15

## 2012-11-25 NOTE — Telephone Encounter (Signed)
Pt called and stated that Dr. Herbie Baltimore wanted to have an echo before his next visit. A recall is in the system for October. Should this patient have his echo before his visit or wait until Dr. Herbie Baltimore orders another one. Patient would also like some samples of Pradaxil to be left at the front desk. Please advise

## 2013-01-08 ENCOUNTER — Telehealth (HOSPITAL_COMMUNITY): Payer: Self-pay | Admitting: *Deleted

## 2013-01-13 ENCOUNTER — Ambulatory Visit (HOSPITAL_COMMUNITY): Payer: Medicare Other

## 2013-01-14 ENCOUNTER — Ambulatory Visit (HOSPITAL_COMMUNITY): Payer: Medicare Other

## 2013-01-17 ENCOUNTER — Telehealth: Payer: Self-pay | Admitting: Cardiology

## 2013-01-17 MED ORDER — DABIGATRAN ETEXILATE MESYLATE 150 MG PO CAPS: 150.0000 mg | ORAL_CAPSULE | Freq: Two times a day (BID) | ORAL | Status: DC

## 2013-01-17 NOTE — Telephone Encounter (Signed)
Called patient to inform samples will be left at front desk.  

## 2013-01-17 NOTE — Telephone Encounter (Signed)
Pt needs to get samples of Pradaxa if we have them. He said he is waiting for pt assistance and he is just about out of them.

## 2013-01-28 ENCOUNTER — Ambulatory Visit (HOSPITAL_COMMUNITY)
Admission: RE | Admit: 2013-01-28 | Discharge: 2013-01-28 | Disposition: A | Discharge status: Home / Self Care | Payer: Medicare Other | Source: Ambulatory Visit | Attending: Cardiology | Admitting: Cardiology

## 2013-01-28 DIAGNOSIS — I359 Nonrheumatic aortic valve disorder, unspecified: Secondary | ICD-10-CM

## 2013-01-28 DIAGNOSIS — I350 Nonrheumatic aortic (valve) stenosis: Secondary | ICD-10-CM

## 2013-01-28 NOTE — Progress Notes (Signed)
2D Echo Performed 01/28/2013    Sitara Cashwell, RCS  

## 2013-02-11 ENCOUNTER — Encounter: Payer: Self-pay | Admitting: Cardiology

## 2013-02-11 ENCOUNTER — Ambulatory Visit (INDEPENDENT_AMBULATORY_CARE_PROVIDER_SITE_OTHER): Payer: Medicare Other | Admitting: Cardiology

## 2013-02-11 VITALS — BP 138/78 | HR 86 | Ht 71.0 in | Wt 194.5 lb

## 2013-02-11 DIAGNOSIS — Z7901 Long term (current) use of anticoagulants: Secondary | ICD-10-CM

## 2013-02-11 DIAGNOSIS — I2510 Atherosclerotic heart disease of native coronary artery without angina pectoris: Secondary | ICD-10-CM

## 2013-02-11 DIAGNOSIS — I4891 Unspecified atrial fibrillation: Secondary | ICD-10-CM

## 2013-02-11 DIAGNOSIS — I359 Nonrheumatic aortic valve disorder, unspecified: Secondary | ICD-10-CM

## 2013-02-11 DIAGNOSIS — I350 Nonrheumatic aortic (valve) stenosis: Secondary | ICD-10-CM

## 2013-02-11 DIAGNOSIS — E785 Hyperlipidemia, unspecified: Secondary | ICD-10-CM

## 2013-02-11 DIAGNOSIS — Z951 Presence of aortocoronary bypass graft: Secondary | ICD-10-CM

## 2013-02-11 DIAGNOSIS — I10 Essential (primary) hypertension: Secondary | ICD-10-CM

## 2013-02-11 DIAGNOSIS — I4821 Permanent atrial fibrillation: Secondary | ICD-10-CM

## 2013-02-11 MED ORDER — RIVAROXABAN 20 MG PO TABS: 20.0000 mg | ORAL_TABLET | Freq: Every day | ORAL | Status: DC

## 2013-02-11 NOTE — Patient Instructions (Signed)
CHANGES PRADAXA TO XARELTO after you complete the bottle.  CONTACT 1 [phone] PATIENT ASSISTANCE XARELTO  Your physician wants you to follow-up in 6 MONTH DR Herbie Baltimore.  You will receive a reminder letter in the mail two months in advance. If you don't receive a letter, please call our office to schedule the follow-up appointment.

## 2013-02-11 NOTE — Progress Notes (Signed)
PATIENT: Richard Simon MRN: [id number]  DOB: May 30, 1929   DOV:02/13/2013 PCP: Georgianne Fick, MD  Clinic Note: Chief Complaint  Patient presents with  . Annual Exam    C/o occas. shortness of breath-thinks it is due to medications,    HPI: Richard Simon is a 79 y.o. male with a PMH below who presents today for 9 month followup. He is a history of coronary disease status post CABG in 2004 with no further evaluation other than a Myoview done in 2012 negative for ischemia. He does have aortic stenosis with regurgitation and noted to be on the severe side of moderate during last evaluation but no significant change from 2013 to 2014. He also is essentially persistent chronic atrial fibrillation is well rate controlled and is on Pradaxa.  Interval History: He presents today, again feeling quite well without any major complaints. He says he is very active walking around doing things in the yard doing other chores. He does walk but not routinely. He really denies any sensation of chest tightness or pressure with rest or exertion. He says occasionally it with exertion he will get a little short of breath, but nothing that concerns him enough to mention. He denies any PND, orthopnea or edema. He also denies any sensation of palpitations or rapid heartbeats. He says sometimes when he walks or does some exercise he will note his heart is "going faster than usual and then he will noted being irregular ". However he denies any sensation of lightheadedness, dizziness or wooziness. No syncope/near-syncope. No TIA or amaurosis fugax symptoms. He does have mild nausea and GI discomfort, but denies any melena, hematochezia or hematuria. No nosebleeds. He also denies any claudication symptoms.  Past Medical History  Diagnosis Date  . CAD (coronary artery disease) 2004    S/P 4-vessel CABG in 2004  . S/P CABG x 4 2004    Post CABG  . Aortic stenosis, moderate     Echo 01/28/2013: Moderately calcified aortic valve  with reduced mobility of right cusp. Moderate to severe stenosis. Mean gradient 25 mm recon peak gradient 39 mmHg. Valve area estimated at 0.79-0.83 cm. Moderate MR. Normal LV size and function EF 55-60%. Elevated LAP. Mild pulmonary attention. Mild moderately dilated left atrium.  . Atrial fibrillation, permanent     Rate controlled. Anticoagulation with Pradaxa.  . Hypertension      very well controlled  . Dyslipidemia, goal LDL below 70     Prior Cardiac Evaluation and Past Surgical History: Past Surgical History  Procedure Laterality Date  . Cardiac catheterization  12/11/2002    Recommendation-CABG; 80% mid LAD, 70% distal LAD. Mid circumflex 95%, OM1 95%. AV groove circumflex 95%. Proximal RCA 100%  . Coronary artery bypass graft  12/15/2002    LIMA-LAD, SVG-diagonal, SVG-OM1-OM2 sequential  . Steffanie Dunn  December 2012    Fixed basal inferior diaphragmatic attenuation versusinfarct, no ischemia  . Transthoracic echocardiogram  September 2013    Normal EF. > 55%; moderate aortic valve stenosis with mild AI. Gradient: Peak 40 mmHg, mean 24 military. Moderate LA dilation. Moderate TR with RV pressure 30-40 mmHg. Mild MR.  . Transthoracic eho  01/28/2013    Moderate-severe left ear with mild AI. Peak and mean gradients no significant change: 39 mmHg and 25 mmHg;; mild LV dilation with EF 5560%. Elevated LAP. Moderate MR. Mild to moderate LA dilation. Mildly elevated RV pressure.    No Known Allergies  Current Outpatient Prescriptions  Medication Sig Dispense Refill  . aspirin  EC 81 MG tablet Take 81 mg by mouth daily.      Marland Kitchen atenolol (TENORMIN) 50 MG tablet Take 50 mg by mouth daily.      Marland Kitchen diltiazem (CARDIZEM CD) 240 MG 24 hr capsule take 1 capsule by mouth once daily  30 capsule  6  . lisinopril (PRINIVIL,ZESTRIL) 40 MG tablet Take 20 mg by mouth daily.      . Omega-3 Fatty Acids (FISH OIL) 1200 MG CAPS Take 1,200 mg by mouth 2 (two) times daily.      . rosuvastatin (CRESTOR)  5 MG tablet Take 5 mg by mouth daily.      . Rivaroxaban (XARELTO) 20 MG TABS tablet Take 1 tablet (20 mg total) by mouth daily.  30 tablet  11   No current facility-administered medications for this visit.   At the time of this is a the patient was on Pradaxa as opposed to Xarelto.   History   Social History Narrative   Married. Father of 5, grandfather 73, great-grandfather 3.   Very active, but without routine exercise. Does yard work and intermittent walking, but nothing routine.   Does not smoke. Occasional alcohol.   ROS: A comprehensive Review of Systems - Negative except Any pertinent symptoms noted above as well as noncardiac symptoms noted below. Gastrointestinal ROS: positive for - Nausea without true vomiting. He does have some mild constipation but is stable. Musculoskeletal ROS: Diffuse musculoskeletal joint pains from arthritis.  PHYSICAL EXAM BP 138/78  Pulse 86  Ht 5\' 11"  (1.803 m)  Wt 194 lb 8 oz (88.225 kg)  BMI 27.14 kg/m2 General appearance: alert, cooperative, appears stated age, no distress and Very well-nourished and well-groomed. Answers questions appropriately. Normal mood and affect. Neck: no adenopathy, soft right carotid bruit -- versus referred aortic murmur., no JVD and supple, symmetrical, trachea midline Lungs: clear to auscultation bilaterally, normal percussion bilaterally and Nonlabored temperature movement Heart: Irregularly irregular rate/rhythm, normal S1 with a soft S2. No S4 gallop. 2-3/6 crescendo-decrescendo mid peaking SEM at RUSB radiating to carotids. 1/6 DM heard at RUSB. Also a 1-to/6 HSM at apex. Nondisplaced PMI. Abdomen: soft, non-tender; bowel sounds normal; no masses,  no organomegaly Extremities: extremities normal, atraumatic, no cyanosis or edema Pulses: 2+ and symmetric Neurologic: Grossly normal  YQM:VHQIONGEX today: Yes Rate: 86 , Rhythm: A. fib, nonspecific ST-T changes,  Recent Labs: Recently checked by PCP who is  following the dyslipidemia.  ASSESSMENT / PLAN: Aortic stenosis, moderate On reviewing his echo D. gradients do not appear to be that much changed. There is a apparent slight increased amount of calcification of the valve leaflets comment however the gradients have not changed.  Would continue to follow every year or so to ensure that is not progressive in faster. He is not having any real symptoms to suggest symptomatic aortic stenosis. The aortic root is also calcified.  Plan: Continue to follow annual echocardiogram as there is a change in symptoms. I discussed the signs and  Symptoms that she should be on lookout for related to severe aortic stenosis.  CAD (coronary artery disease) Status post CABG roughly 10 years ago with no active anginal type symptoms. Most recent Myoview was in the setting of chest discomfort and was negative for ischemia and fixed diaphragmatic attenuation. Plan: Continue aspirin plus beta blocker and ACE inhibitor and statin.  Atrial fibrillation, permanent Relatively well controlled. He has a chart that shows the blood pressures and heart rates over the last couple months, and pharynx  are all ranging in the 70s to 90s at home. He is on calcium channel blockers beta blocker.  He is noting some symptoms of nausea, that he thinks may be related to his Pradaxa. He is quite happy not having any warfarin, but was wondering if there is another option. This he is hurt Xarelto, from family member and is happy with the constant once daily dosing as opposed to twice a day dosing.  Plan: Continue current rate control agents with beta blocker and calcium channel blocker. Convert from Pradaxa to Xarelto.  Current use of long term anticoagulation We did discuss the options of a NOAC. He would like to go with once daily to Xarelto. We will provide him samples and the co-pay chart. Hopefully this will qualify him for this year. If not we have given him the contact number for the  pharmaceutical company to obtain Eliquis via patient assistance.  Hypertension Very well-controlled. He brings with him a chart showing his blood pressure ranges anywhere from the high 110s to maybe occasionally in the 140 systolic with most with diastolic things 70-90. This is over last several months.  Dyslipidemia, goal LDL below 70 Currently on Crestor 5 mg. At this point we do not have any routine labs have been checked recently. Would continue Crestor current dose now. This is being followed by his primary physician.   Orders Placed This Encounter  Procedures  . EKG 12-Lead   Meds ordered this encounter  Medications  . atenolol (TENORMIN) 50 MG tablet    Sig: Take 50 mg by mouth daily.  Marland Kitchen lisinopril (PRINIVIL,ZESTRIL) 40 MG tablet    Sig: Take 20 mg by mouth daily.  . rosuvastatin (CRESTOR) 5 MG tablet    Sig: Take 5 mg by mouth daily.  Marland Kitchen aspirin EC 81 MG tablet    Sig: Take 81 mg by mouth daily.  . Omega-3 Fatty Acids (FISH OIL) 1200 MG CAPS    Sig: Take 1,200 mg by mouth 2 (two) times daily.  . Rivaroxaban (XARELTO) 20 MG TABS tablet    Sig: Take 1 tablet (20 mg total) by mouth daily.    Dispense:  30 tablet    Refill:  11    Followup: 6 months; and order echo at that time   DAVID W. Herbie Baltimore, M.D., M.S. [facility] [address] [place], [place]  [address]  [phone] Pager # [phone]

## 2013-02-12 ENCOUNTER — Telehealth: Payer: Self-pay | Admitting: Cardiology

## 2013-02-12 NOTE — Telephone Encounter (Signed)
Please call-need to give Richard Simon some information that she needs to relay to Dr Herbie Baltimore.

## 2013-02-12 NOTE — Telephone Encounter (Signed)
LMTCB

## 2013-02-13 ENCOUNTER — Encounter: Payer: Self-pay | Admitting: Cardiology

## 2013-02-13 DIAGNOSIS — Z7901 Long term (current) use of anticoagulants: Secondary | ICD-10-CM | POA: Insufficient documentation

## 2013-02-13 DIAGNOSIS — E785 Hyperlipidemia, unspecified: Secondary | ICD-10-CM | POA: Insufficient documentation

## 2013-02-13 DIAGNOSIS — I350 Nonrheumatic aortic (valve) stenosis: Secondary | ICD-10-CM | POA: Insufficient documentation

## 2013-02-13 DIAGNOSIS — I10 Essential (primary) hypertension: Secondary | ICD-10-CM | POA: Insufficient documentation

## 2013-02-13 DIAGNOSIS — I4821 Permanent atrial fibrillation: Secondary | ICD-10-CM | POA: Insufficient documentation

## 2013-02-13 NOTE — Assessment & Plan Note (Signed)
Currently on Crestor 5 mg. At this point we do not have any routine labs have been checked recently. Would continue Crestor current dose now. This is being followed by his primary physician.

## 2013-02-13 NOTE — Assessment & Plan Note (Signed)
Very well-controlled. He brings with him a chart showing his blood pressure ranges anywhere from the high 110s to maybe occasionally in the 140 systolic with most with diastolic things 70-90. This is over last several months.

## 2013-02-13 NOTE — Assessment & Plan Note (Signed)
Status post CABG roughly 10 years ago with no active anginal type symptoms. Most recent Myoview was in the setting of chest discomfort and was negative for ischemia and fixed diaphragmatic attenuation. Plan: Continue aspirin plus beta blocker and ACE inhibitor and statin.

## 2013-02-13 NOTE — Assessment & Plan Note (Signed)
We did discuss the options of a NOAC. He would like to go with once daily to Xarelto. We will provide him samples and the co-pay chart. Hopefully this will qualify him for this year. If not we have given him the contact number for the pharmaceutical company to obtain Eliquis via patient assistance.

## 2013-02-13 NOTE — Assessment & Plan Note (Signed)
Relatively well controlled. He has a chart that shows the blood pressures and heart rates over the last couple months, and pharynx are all ranging in the 70s to 90s at home. He is on calcium channel blockers beta blocker.  He is noting some symptoms of nausea, that he thinks may be related to his Pradaxa. He is quite happy not having any warfarin, but was wondering if there is another option. This he is hurt Xarelto, from family member and is happy with the constant once daily dosing as opposed to twice a day dosing.  Plan: Continue current rate control agents with beta blocker and calcium channel blocker. Convert from Pradaxa to Xarelto.

## 2013-02-13 NOTE — Assessment & Plan Note (Addendum)
On reviewing his echo D. gradients do not appear to be that much changed. There is a apparent slight increased amount of calcification of the valve leaflets comment however the gradients have not changed.  Would continue to follow every year or so to ensure that is not progressive in faster. He is not having any real symptoms to suggest symptomatic aortic stenosis. The aortic root is also calcified.  Plan: Continue to follow annual echocardiogram as there is a change in symptoms. I discussed the signs and  Symptoms that she should be on lookout for related to severe aortic stenosis.

## 2013-02-18 ENCOUNTER — Telehealth: Payer: Self-pay | Admitting: *Deleted

## 2013-02-18 NOTE — Telephone Encounter (Signed)
RN called [employer]  for prior authorization request for  Xarelto. Spoke to representative- question answered - DX- afib 427.1 -no valvular issues.  Medication was approved. Notified Pharmacist at [facility] on [address]

## 2013-02-20 ENCOUNTER — Telehealth: Payer: Self-pay | Admitting: Cardiology

## 2013-02-20 NOTE — Telephone Encounter (Signed)
Pt called and said [employer] said they need Dr Herbie Baltimore dea number for his Crestor prescription  please. Please fax [phone].

## 2013-02-20 NOTE — Telephone Encounter (Signed)
DEA license update faxed to [facility]

## 2013-02-20 NOTE — Telephone Encounter (Signed)
Message forwarded to Jasmine December, RN/Kristin, PharmD.

## 2013-02-24 ENCOUNTER — Telehealth: Payer: Self-pay | Admitting: Pharmacist Clinician (PhC)/ Clinical Pharmacy Specialist

## 2013-02-24 NOTE — Telephone Encounter (Signed)
Pt came into office last week to get MD signature for Patient Assistance program for Xarelto.  Dr. Herbie Baltimore was not in office at that time.  Form was filled out and Dr. Herbie Baltimore signed on 11-7.  Pt notified onn 11/10 that paperwork was ready to be picked up.

## 2013-02-25 ENCOUNTER — Encounter: Payer: Self-pay | Admitting: Cardiology

## 2013-02-27 ENCOUNTER — Telehealth: Payer: Self-pay | Admitting: Cardiology

## 2013-02-27 NOTE — Telephone Encounter (Signed)
Message forwarded to K. Alvstad, PharmD/Sharon, RN.

## 2013-02-27 NOTE — Telephone Encounter (Signed)
[employer] said they never received Dr Herbie Baltimore DEA number. They said to fax it to this number [phone]>please do this today this is for his Crestor.

## 2013-02-27 NOTE — Telephone Encounter (Signed)
DEA license re-faxed to [facility]

## 2013-03-03 ENCOUNTER — Telehealth: Payer: Self-pay | Admitting: Pharmacist Clinician (PhC)/ Clinical Pharmacy Specialist

## 2013-03-03 NOTE — Telephone Encounter (Signed)
Pt dropped off form for [employer] patient assistance for Crestor 5mg .  Form signed by Dr. Herbie Baltimore.  Pt called to pick up form.

## 2013-04-14 ENCOUNTER — Telehealth: Payer: Self-pay | Admitting: Cardiology

## 2013-04-14 NOTE — Telephone Encounter (Signed)
Would like to speak to [facility] about his pradaxa and xarelto .Marland Kitchen Please call    Thanks

## 2013-04-15 MED ORDER — DABIGATRAN ETEXILATE MESYLATE 150 MG PO CAPS: 150.0000 mg | ORAL_CAPSULE | Freq: Two times a day (BID) | ORAL | Status: DC

## 2013-04-15 NOTE — Telephone Encounter (Signed)
Samples are placed at front desk

## 2013-04-15 NOTE — Telephone Encounter (Signed)
Spoke to patient. Informed him of the instruction of taking the samples of XARELTO then restart Pradaxa . Patient verbalized understanding. He is also  aware that samples of Pradaxa are available for him to pick up from the office

## 2013-04-15 NOTE — Telephone Encounter (Signed)
Spoke to patient.  Richard Simon states he received a letter from the patient assistance program for Xarelto stating that he was not approved for the program. Patient states he wants to continue with Pradaxa. Patient states he has been taking Pradaxa along, he never started with samples of Xarelto because he was awaitng on  the decision from the medication assistance program. Richard Crunkleton wanted to bring back the samples of Xarelto to the office.  RN informed patient that samples could not be returned,but will discuss with Dr Wendi Snipes ,Pharmacist.about using the samples of XARELTO then restarting  Pradaxa again.Patient verbalized understanding

## 2013-04-15 NOTE — Telephone Encounter (Signed)
Spoke to F[place]. Informed her of patient decision to continue with PRADAXA. Patient states it will not help him monetary to switch medication.RN asked if patient could use samples of Xarelto.  Per Belenda Cruise , patient can use Pradaxa that evening and then next day start Xarelto the next evening,after completing the samples restart the Pradaxa the next morning.

## 2013-07-01 ENCOUNTER — Other Ambulatory Visit: Payer: Self-pay | Admitting: *Deleted

## 2013-07-01 MED ORDER — DABIGATRAN ETEXILATE MESYLATE 150 MG PO CAPS: 150.0000 mg | ORAL_CAPSULE | Freq: Two times a day (BID) | ORAL | Status: DC

## 2013-07-01 NOTE — Telephone Encounter (Signed)
Per Donovan Kail Requesting samples of Pradaxa.  Samples given to Laretta Alstrom at front desk to give to pt.

## 2013-07-10 ENCOUNTER — Telehealth: Payer: Self-pay | Admitting: Cardiology

## 2013-07-10 NOTE — Telephone Encounter (Signed)
Richard Simon stopped by today inquiring as to the status of his Pradaxa application to be completed by Dr. Ellyn Hack.  Please call him back at [phone] on the status. ST

## 2013-07-28 ENCOUNTER — Telehealth: Payer: Self-pay | Admitting: Cardiology

## 2013-07-28 MED ORDER — DABIGATRAN ETEXILATE MESYLATE 150 MG PO CAPS: 150.0000 mg | ORAL_CAPSULE | Freq: Two times a day (BID) | ORAL | Status: DC

## 2013-07-28 NOTE — Telephone Encounter (Signed)
Pt is herein waiting room requesting a prescription and an application rx assist.

## 2013-07-28 NOTE — Telephone Encounter (Signed)
Pt given rx for pradaxa 150mg  # 180 +3 refills

## 2013-07-28 NOTE — Telephone Encounter (Signed)
Please call regarding a prescription and an application for prescription asst. Please call  Thanks

## 2013-07-28 NOTE — Telephone Encounter (Signed)
Message forwarded to Tommy Medal, PharmD.

## 2013-07-31 ENCOUNTER — Other Ambulatory Visit: Payer: Self-pay | Admitting: Cardiology

## 2013-07-31 NOTE — Telephone Encounter (Signed)
Rx was sent to pharmacy electronically. 

## 2013-08-01 ENCOUNTER — Telehealth: Payer: Self-pay | Admitting: Cardiology

## 2013-08-01 NOTE — Telephone Encounter (Signed)
Wants to know if it is all right to process his Pradaxa 180 Mg #90 Prescription? The cover sheet did not come through with your name or phone number on it.

## 2013-08-04 NOTE — Telephone Encounter (Signed)
LMTCB

## 2013-08-04 NOTE — Telephone Encounter (Signed)
[facility] called back. Clarified medication is OK to fill. They think patient (?) may have faxed script in.Marland Kitchen Unsure if came from our office. RN authorized refill/script per documentation of refill on 4.13

## 2013-12-15 ENCOUNTER — Telehealth: Payer: Self-pay | Admitting: Cardiology

## 2013-12-15 MED ORDER — DABIGATRAN ETEXILATE MESYLATE 150 MG PO CAPS: 150.0000 mg | ORAL_CAPSULE | Freq: Two times a day (BID) | ORAL | Status: DC

## 2013-12-15 NOTE — Telephone Encounter (Signed)
Pt is in [place] his Pradaxa. Would you please call in #60 for his Pradaxa,you can only get it with #60.

## 2013-12-15 NOTE — Telephone Encounter (Signed)
Rx was sent to pharmacy electronically. 

## 2014-01-12 ENCOUNTER — Telehealth: Payer: Self-pay | Admitting: Cardiology

## 2014-01-12 NOTE — Telephone Encounter (Signed)
Pt called in stating that his medication will be switch to [facility] mail order pharmacy...FYI . Pt was told that he could not have any medications refilled until Dr. Martinique gives the ok. Please call to verify information  Thanks

## 2014-01-13 ENCOUNTER — Other Ambulatory Visit: Payer: Self-pay

## 2014-01-13 MED ORDER — DILTIAZEM HCL ER COATED BEADS 240 MG PO CP24: 240.0000 mg | ORAL_CAPSULE | Freq: Every day | ORAL | Status: DC

## 2014-01-13 NOTE — Telephone Encounter (Signed)
Rx was sent to pharmacy electronically. 

## 2014-01-14 NOTE — Telephone Encounter (Signed)
Spoke to patient . He states that he would like to switch to [facility] mail order  patient request  RN contact company - [phone]    --[employer] [id number]  Spoke to representative - just need a RX for DILTIAZEM (CARTIA XT) 240 MG QD #90 x 0 refills Information given OTHER MEDICATIONS  WERE FILLED BY PCP  Appointment made 03/16/14 with Dr Ellyn Hack.   PATIENT AWARE

## 2014-01-15 ENCOUNTER — Other Ambulatory Visit: Payer: Self-pay | Admitting: Cardiology

## 2014-01-15 NOTE — Telephone Encounter (Signed)
Rx was sent to pharmacy electronically. 

## 2014-03-10 ENCOUNTER — Other Ambulatory Visit: Payer: Self-pay | Admitting: *Deleted

## 2014-03-10 MED ORDER — DILTIAZEM HCL ER COATED BEADS 240 MG PO CP24: 240.0000 mg | ORAL_CAPSULE | Freq: Every day | ORAL | Status: DC

## 2014-03-10 NOTE — Telephone Encounter (Signed)
Rx was sent to pharmacy electronically. 

## 2014-03-16 ENCOUNTER — Encounter: Payer: Self-pay | Admitting: Cardiology

## 2014-03-16 ENCOUNTER — Ambulatory Visit (INDEPENDENT_AMBULATORY_CARE_PROVIDER_SITE_OTHER): Payer: 59 | Admitting: Cardiology

## 2014-03-16 VITALS — BP 138/70 | HR 81 | Ht 71.0 in | Wt 193.3 lb

## 2014-03-16 DIAGNOSIS — I4821 Permanent atrial fibrillation: Secondary | ICD-10-CM

## 2014-03-16 DIAGNOSIS — Z7901 Long term (current) use of anticoagulants: Secondary | ICD-10-CM

## 2014-03-16 DIAGNOSIS — I482 Chronic atrial fibrillation: Secondary | ICD-10-CM

## 2014-03-16 DIAGNOSIS — I2510 Atherosclerotic heart disease of native coronary artery without angina pectoris: Secondary | ICD-10-CM

## 2014-03-16 DIAGNOSIS — I10 Essential (primary) hypertension: Secondary | ICD-10-CM

## 2014-03-16 DIAGNOSIS — I350 Nonrheumatic aortic (valve) stenosis: Secondary | ICD-10-CM

## 2014-03-16 DIAGNOSIS — E785 Hyperlipidemia, unspecified: Secondary | ICD-10-CM

## 2014-03-16 MED ORDER — ROSUVASTATIN CALCIUM 10 MG PO TABS: 10.0000 mg | ORAL_TABLET | Freq: Every day | ORAL | Status: DC

## 2014-03-16 MED ORDER — DILTIAZEM HCL ER COATED BEADS 240 MG PO CP24: 240.0000 mg | ORAL_CAPSULE | Freq: Every day | ORAL | Status: DC

## 2014-03-16 NOTE — Patient Instructions (Addendum)
MEDICATIONS WERE REORDERED DILTIAZEM AND CRESTOR  Start taking lisinopril at bedtime.    Your physician has requested that you have an echocardiogram. Echocardiography is a painless test that uses sound waves to create images of your heart. It provides your doctor with information about the size and shape of your heart and how well your heart's chambers and valves are working. This procedure takes approximately one hour. There are no restrictions for this procedure.  Your physician wants you to follow-up in 3 month Dr Ellyn Hack.  You will receive a reminder letter in the mail two months in advance. If you don't receive a letter, please call our office to schedule the follow-up appointment.

## 2014-03-18 ENCOUNTER — Encounter: Payer: Self-pay | Admitting: Cardiology

## 2014-03-18 NOTE — Assessment & Plan Note (Signed)
He has had a couple stable Echo's in 2013 & 2014.  No real change in the murmur. He should have an echocardiogram prior to his upcoming followup. The plan was for her to be done before this visit. I will see him back in 3 months we can discuss the results of his echo. He is still relatively asymptomatic.  We discussed concerning symptoms for aortic stenosis.

## 2014-03-18 NOTE — Assessment & Plan Note (Addendum)
We plan to switch him to Xarelto, but he continued to stay on Pradaxa. He apparently had financial issues with Xarelto.

## 2014-03-18 NOTE — Assessment & Plan Note (Addendum)
No activating the symptoms he should be due for a Myoview followup next year, since her last one was in 2012. Continue aspirin, beta blocker, ACE inhibitor and statin. He is also on calcium channel blocker for antianginal effect as well as rate control.  Continue daily exercise -- Walking at least a mile a day. He also does lots of yard work.

## 2014-03-18 NOTE — Progress Notes (Signed)
PATIENT: Richard Simon MRN: [id number]  DOB: 1930/02/02   DOV:03/18/2014 PCP: Merrilee Seashore, MD  Clinic Note: Chief Complaint  Patient presents with  . Follow-up     13 mth visit. pt denies chest pain and swelling. pt states that he does experience sob at times.   HPI: Richard Simon is a 79 y.o. male with a PMH below who presents today for 12 month followup. He is a history of coronary disease status post CABG in 2004 & negative Myoview in 2012, ~ Moderate Aortic Stenosis, and chronic/peristent rate controlled atrial fibrillation on Pradaxa.   Interval History:  Richard Simon presents feeling relatively well without any major cardiac complaints. He has occasional dizziness is positional in nature today time after taking his morning medications. In the past we had reduced his ACE inhibitor dose from 40 mg 20 mg to allow for some mild is hypertension. He has not had any syncope or near-syncope, TIA or amaurosis fugax symptoms. He continues to be relatively asymptomatic with his atrial fibrillation with the exception of occasionally when he lies down at night and his heart rate is fast, he will feel it. He doesn't have any resting or exertional chest tightness or pressure. He walks about a mile a day and average pace and denies any exertional chest tightness/pressure or dyspnea.  He doesn't occasional mild lower extremity edema but denies any PND or orthopnea. He is doing well the Pradaxa, maybe has occasional mild nausea associated with it, but no significant symptoms. No melena, hematochezia, hematuria or epistaxis. No myalgias or arthralgias on his Crestor.  Past Medical History  Diagnosis Date  . CAD (coronary artery disease) 2004    S/P 4-vessel CABG in 2004  . S/P CABG x 4 2004    Post CABG  . Aortic stenosis, moderate     Echo 01/28/2013: Moderately calcified aortic valve with reduced mobility of right cusp. Moderate to severe stenosis. Mean gradient 25 mm recon peak gradient 39 mmHg. Valve area  estimated at 0.79-0.83 cm. Moderate MR. Normal LV size and function EF 55-60%. Elevated LAP. Mild pulmonary attention. Mild moderately dilated left atrium.  . Atrial fibrillation, permanent     Rate controlled. Anticoagulation with Pradaxa.  . Hypertension      very well controlled  . Dyslipidemia, goal LDL below 70     Prior Cardiac Evaluation and Past Surgical History: Past Surgical History  Procedure Laterality Date  . Cardiac catheterization  12/11/2002    Recommendation-CABG; 80% mid LAD, 70% distal LAD. Mid circumflex 95%, OM1 95%. AV groove circumflex 95%. Proximal RCA 100%  . Coronary artery bypass graft  12/15/2002    LIMA-LAD, SVG-diagonal, SVG-OM1-OM2 sequential  . Leane Call  December 2012    Fixed basal inferior diaphragmatic attenuation versusinfarct, no ischemia  . Transthoracic echocardiogram  September 2013    Normal EF. > 55%; moderate aortic valve stenosis with mild AI. Gradient: Peak 40 mmHg, mean 24 military. Moderate LA dilation. Moderate TR with RV pressure 30-40 mmHg. Mild MR.  . Transthoracic eho  01/28/2013    Moderate-severe left ear with mild AI. Peak and mean gradients no significant change: 39 mmHg and 25 mmHg;; mild LV dilation with EF 5560%. Elevated LAP. Moderate MR. Mild to moderate LA dilation. Mildly elevated RV pressure.   No Known Allergies  Current Outpatient Prescriptions  Medication Sig Dispense Refill  . aspirin EC 81 MG tablet Take 81 mg by mouth daily.    Marland Kitchen atenolol (TENORMIN) 50 MG tablet Take 50 mg by  mouth daily.    . dabigatran (PRADAXA) 150 MG CAPS capsule Take 1 capsule (150 mg total) by mouth 2 (two) times daily. 60 capsule 0  . diltiazem (CARDIZEM CD) 240 MG 24 hr capsule Take 1 capsule (240 mg total) by mouth daily. 90 capsule 3  . lisinopril (PRINIVIL,ZESTRIL) 40 MG tablet Take 20 mg by mouth daily.    . Omega-3 Fatty Acids (FISH OIL) 1200 MG CAPS Take 1,200 mg by mouth 2 (two) times daily.    . rosuvastatin (CRESTOR) 10 MG  tablet Take 1 tablet (10 mg total) by mouth daily. 90 tablet 3   No current facility-administered medications for this visit.   At the time of this is a the patient was on Pradaxa as opposed to Xarelto.   History   Social History Narrative   Married. Father of 26, grandfather 42, great-grandfather 3.   Very active, but without routine exercise. Does yard work and intermittent walking, but nothing routine.   Does not smoke. Occasional alcohol.   ROS: A comprehensive Review of Systems - was performed Review of Systems  HENT: Negative for congestion and nosebleeds.   Respiratory: Negative for shortness of breath and wheezing.   Cardiovascular: Negative for claudication.  Gastrointestinal: Positive for nausea and constipation. Negative for vomiting, blood in stool and melena.  Genitourinary: Negative.  Negative for hematuria.  Musculoskeletal: Positive for joint pain.       Right hip and knee pain that occasionally hurts and if he is walking up an incline.  Neurological: Positive for dizziness. Negative for seizures and weakness.  Endo/Heme/Allergies: Does not bruise/bleed easily.  Psychiatric/Behavioral: Negative for depression and memory loss. The patient is not nervous/anxious and does not have insomnia.   All other systems reviewed and are negative.   PHYSICAL EXAM BP 138/70 mmHg  Pulse 81  Ht 5\' 11"  (1.803 m)  Wt 193 lb 4.8 oz (87.68 kg)  BMI 26.97 kg/m2 General appearance: alert, cooperative, appears stated age, no distress and Very well-nourished and well-groomed. Answers questions appropriately. Normal mood and affect. Neck: no adenopathy, soft right carotid bruit -- versus referred aortic murmur., no JVD and supple, symmetrical, trachea midline Lungs:CTAB, normal percussion bilaterally and Nonlabored temperature movement Heart: Irregularly irregular rate/rhythm, normal S1 with a soft S2. No S4 gallop. 2-3/6 crescendo-decrescendo mid peaking SEM at RUSB --> Carotids, 1/6 DM  heard at RUSB & 1-to/6 HSM at apex. Nondisplaced PMI. Abdomen: soft, non-tender; bowel sounds normal; no masses,  no organomegaly Extremities: extremities normal, atraumatic, no cyanosis or edema Pulses: 2+ and symmetric Neurologic: Grossly normal  KDT:OIZTIWPYK today: Yes Rate: 86 , Rhythm: A. fib, nonspecific ST-T changes,  Recent Labs: Recently checked by PCP who is following the dyslipidemia.  ASSESSMENT / PLAN: Multivessel Native CAD - s/p CABGx4: LIMA to LAD, SVG-Diag, seqSVG-OM1-OM2 No activating the symptoms he should be due for a Myoview followup next year, since her last one was in 2012. Continue aspirin, beta blocker, ACE inhibitor and statin. He is also on calcium channel blocker for antianginal effect as well as rate control.  Continue daily exercise -- Walking at least a mile a day. He also does lots of yard work.  Aortic stenosis, moderate He has had a couple stable Echo's in 2013 & 2014.  No real change in the murmur. He should have an echocardiogram prior to his upcoming followup. The plan was for her to be done before this visit. I will see him back in 3 months we can discuss the results  of his echo. He is still relatively asymptomatic.  We discussed concerning symptoms for aortic stenosis.  Atrial fibrillation, permanent Rate controlled on beta blocker and calcium channel blocker. Despite his mild nausea but Pradaxa, he seems to be doing quite well and no bleeding issues. For now I will simply continue with Pradaxa unless his nausea gets worse. In that case I would probably switch him to either Xarelto or Eliquis.  Current use of long term anticoagulation We plan to switch him to Xarelto, but he continued to stay on Pradaxa. He apparently had financial issues with Xarelto.  Dyslipidemia, goal LDL below 70 On Crestor. Labs are monitored by PCP. He reports that the levels seem to be well controlled.  Essential hypertension Stable blood pressure on beta blocker,  calcium channel blocker and low-dose ACE inhibitor. He says that his blood pressure usually averages about what we have today.  The morning, I asked him to take his lisinopril dose at bedtime.    Orders Placed This Encounter  Procedures  . EKG 12-Lead  . 2D Echocardiogram without contrast    Standing Status: Future     Number of Occurrences:      Standing Expiration Date: 03/16/2015    Order Specific Question:  Type of Echo    Answer:  Complete    Order Specific Question:  Where should this test be performed    Answer:  [facility]    Order Specific Question:  Reason for exam-Echo    Answer:  Aortic Valve Disorder 424.1 / I35.9   Meds ordered this encounter  Medications  . diltiazem (CARDIZEM CD) 240 MG 24 hr capsule    Sig: Take 1 capsule (240 mg total) by mouth daily.    Dispense:  90 capsule    Refill:  3  . rosuvastatin (CRESTOR) 10 MG tablet    Sig: Take 1 tablet (10 mg total) by mouth daily.    Dispense:  90 tablet    Refill:  3   Followup: 3 months; with Echo prior to f/u    Leonie Man, M.D., M.S. Interventional Cardiologist   Pager # [phone]

## 2014-03-18 NOTE — Assessment & Plan Note (Addendum)
Stable blood pressure on beta blocker, calcium channel blocker and low-dose ACE inhibitor. He says that his blood pressure usually averages about what we have today.  The morning, I asked him to take his lisinopril dose at bedtime.

## 2014-03-18 NOTE — Assessment & Plan Note (Signed)
Rate controlled on beta blocker and calcium channel blocker. Despite his mild nausea but Pradaxa, he seems to be doing quite well and no bleeding issues. For now I will simply continue with Pradaxa unless his nausea gets worse. In that case I would probably switch him to either Xarelto or Eliquis.

## 2014-03-18 NOTE — Assessment & Plan Note (Signed)
On Crestor. Labs are monitored by PCP. He reports that the levels seem to be well controlled.

## 2014-03-20 ENCOUNTER — Other Ambulatory Visit: Payer: Self-pay | Admitting: *Deleted

## 2014-03-20 MED ORDER — ROSUVASTATIN CALCIUM 10 MG PO TABS: 10.0000 mg | ORAL_TABLET | Freq: Every day | ORAL | Status: DC

## 2014-03-20 NOTE — Telephone Encounter (Signed)
Need prescription for free medication from [facility] and [facility]

## 2014-03-23 ENCOUNTER — Telehealth: Payer: Self-pay

## 2014-03-23 ENCOUNTER — Other Ambulatory Visit: Payer: Self-pay | Admitting: *Deleted

## 2014-03-23 DIAGNOSIS — I10 Essential (primary) hypertension: Secondary | ICD-10-CM

## 2014-03-23 DIAGNOSIS — I4821 Permanent atrial fibrillation: Secondary | ICD-10-CM

## 2014-03-23 MED ORDER — DILTIAZEM HCL ER COATED BEADS 240 MG PO CP24: 240.0000 mg | ORAL_CAPSULE | Freq: Every day | ORAL | Status: DC

## 2014-03-23 NOTE — Telephone Encounter (Signed)
Prescription for Crestor 10 mg faxed to [facility] at [place].

## 2014-04-21 ENCOUNTER — Ambulatory Visit (HOSPITAL_COMMUNITY): Payer: 59

## 2014-05-18 ENCOUNTER — Other Ambulatory Visit: Payer: Self-pay | Admitting: Cardiology

## 2014-05-18 ENCOUNTER — Telehealth: Payer: Self-pay | Admitting: Cardiology

## 2014-05-18 DIAGNOSIS — I10 Essential (primary) hypertension: Secondary | ICD-10-CM

## 2014-05-18 DIAGNOSIS — I4821 Permanent atrial fibrillation: Secondary | ICD-10-CM

## 2014-05-18 MED ORDER — LISINOPRIL 40 MG PO TABS: 20.0000 mg | ORAL_TABLET | Freq: Every day | ORAL | Status: DC

## 2014-05-18 MED ORDER — DILTIAZEM HCL ER COATED BEADS 240 MG PO CP24: 240.0000 mg | ORAL_CAPSULE | Freq: Every day | ORAL | Status: DC

## 2014-05-18 NOTE — Telephone Encounter (Signed)
Rx refill sent to patient pharmacy   

## 2014-05-18 NOTE — Telephone Encounter (Signed)
°  1. Which medications need to be refilled? Lisinopril 40mg   2. Which pharmacy is medication to be sent to?[facility]   3. Do they need a 30 day or 90 day supply? 90 day  4. Would they like a call back once the medication has been sent to the pharmacy? NO  *THE ID# IS [id number]

## 2014-05-18 NOTE — Telephone Encounter (Signed)
Rx(s) sent to pharmacy electronically.  

## 2014-05-18 NOTE — Telephone Encounter (Signed)
°  1. Which medications need to be refilled? Diltizem   2. Which pharmacy is medication to be sent to? [facility]  3. Do they need a 30 day or 90 day supply? 90  4. Would they like a call back once the medication has been sent to the pharmacy? No  *A NEW PRESCRIPTION IS NEEDING TO BE SENT IN* PT STATED THAT THE PHARMACY NEEDED HIS NAME, SHIPPING ADDRESS,AND ID#[id number]

## 2014-05-27 ENCOUNTER — Ambulatory Visit (HOSPITAL_COMMUNITY): Payer: 59

## 2014-06-16 HISTORY — PX: TRANSTHORACIC ECHOCARDIOGRAM: SHX275

## 2014-06-18 ENCOUNTER — Telehealth: Payer: Self-pay | Admitting: Cardiology

## 2014-06-18 NOTE — Telephone Encounter (Signed)
PATIENT CALLED STATES,HE SPOKE TO  MEDICATION COMPANY FOR PRADAXA- his annual medication form is needed to be completed He was letting us know to expect paperwork was being faxed. RN informed patient has no received faxed as of yet. Will contact him when received. Patient verbalized understanding.

## 2014-06-19 ENCOUNTER — Telehealth: Payer: Self-pay | Admitting: *Deleted

## 2014-06-19 MED ORDER — DABIGATRAN ETEXILATE MESYLATE 150 MG PO CAPS: 150.0000 mg | ORAL_CAPSULE | Freq: Two times a day (BID) | ORAL | Status: DC

## 2014-06-19 NOTE — Telephone Encounter (Signed)
Patient walked in to office   he wanted to know if fax has come in from patient assistance for pradaxa RN informed patient - no Patient wanted to know if samples are available RN gave patient (4) samples pak Patient states he will contact the company.

## 2014-06-19 NOTE — Telephone Encounter (Signed)
Pt calling to see if you received the fax?

## 2014-06-19 NOTE — Telephone Encounter (Signed)
Spoke to wife  Please informed patient , RN has not received patient's drug assistance program for Pradaxa. She states she will give him this message.

## 2014-06-24 ENCOUNTER — Encounter: Payer: Self-pay | Admitting: Cardiology

## 2014-06-24 ENCOUNTER — Ambulatory Visit (HOSPITAL_COMMUNITY)
Admission: RE | Admit: 2014-06-24 | Discharge: 2014-06-24 | Disposition: A | Discharge status: Home / Self Care | Payer: Commercial Managed Care - HMO | Source: Ambulatory Visit | Attending: Internal Medicine | Admitting: Internal Medicine

## 2014-06-24 DIAGNOSIS — I350 Nonrheumatic aortic (valve) stenosis: Secondary | ICD-10-CM | POA: Diagnosis not present

## 2014-06-24 DIAGNOSIS — I2510 Atherosclerotic heart disease of native coronary artery without angina pectoris: Secondary | ICD-10-CM | POA: Insufficient documentation

## 2014-06-24 DIAGNOSIS — I10 Essential (primary) hypertension: Secondary | ICD-10-CM | POA: Diagnosis not present

## 2014-06-24 DIAGNOSIS — I4821 Permanent atrial fibrillation: Secondary | ICD-10-CM

## 2014-06-24 DIAGNOSIS — I482 Chronic atrial fibrillation: Secondary | ICD-10-CM | POA: Diagnosis not present

## 2014-06-24 NOTE — Progress Notes (Signed)
2D Echocardiogram Complete.  06/24/2014   Deliah Boston, RDCS   Preliminary Technician Findings:  Aortic Valve Stenosis as seen prior.  Aortic Valve velocities are difficult to obtain with Pedoff probe, due to body habitus.

## 2014-06-25 ENCOUNTER — Telehealth: Payer: Self-pay | Admitting: *Deleted

## 2014-06-25 MED ORDER — DABIGATRAN ETEXILATE MESYLATE 150 MG PO CAPS: 150.0000 mg | ORAL_CAPSULE | Freq: Two times a day (BID) | ORAL | Status: DC

## 2014-06-25 NOTE — Telephone Encounter (Signed)
Spoke to patient. Result given . Verbalized understanding Also informed patient , have not received patient assistance form from [employer]--Pradaxa Patient states he will contact the company again.

## 2014-06-25 NOTE — Telephone Encounter (Signed)
-----   Message from Leonie Man, MD sent at 06/24/2014  6:32 PM EST ----- Overall, still remains is moderate-severe aortic stenosis. There has been a slight progression, but not yet to the point Of Severe. Normal left ventricular function.  Other findings are stable.  Leonie Man, MD  Please forward to PCP

## 2014-06-25 NOTE — Telephone Encounter (Signed)
INFORMED PATIENT RECEIVED PATIENT ASSISTANCE FORM.  PRESCRIPTION PRINTED FOR SIGNATURE FOR MAILING TO [facility]

## 2014-06-29 ENCOUNTER — Telehealth: Payer: Self-pay | Admitting: *Deleted

## 2014-06-29 NOTE — Telephone Encounter (Signed)
Spoke to patient Informed patient patient assistance form for Pradaxa is ready for pick up. Information filled out from office standpoint, awaiting for patient's information. Patient states he will pick up information.

## 2014-06-30 DIAGNOSIS — Z1389 Encounter for screening for other disorder: Secondary | ICD-10-CM | POA: Diagnosis not present

## 2014-06-30 DIAGNOSIS — Z0000 Encounter for general adult medical examination without abnormal findings: Secondary | ICD-10-CM | POA: Diagnosis not present

## 2014-06-30 DIAGNOSIS — E782 Mixed hyperlipidemia: Secondary | ICD-10-CM | POA: Diagnosis not present

## 2014-06-30 DIAGNOSIS — I10 Essential (primary) hypertension: Secondary | ICD-10-CM | POA: Diagnosis not present

## 2014-07-03 ENCOUNTER — Telehealth: Payer: Self-pay | Admitting: *Deleted

## 2014-07-03 NOTE — Telephone Encounter (Signed)
OPEN ERROR

## 2014-07-06 ENCOUNTER — Ambulatory Visit: Payer: 59 | Admitting: Cardiology

## 2014-07-07 DIAGNOSIS — I481 Persistent atrial fibrillation: Secondary | ICD-10-CM | POA: Diagnosis not present

## 2014-07-07 DIAGNOSIS — I2510 Atherosclerotic heart disease of native coronary artery without angina pectoris: Secondary | ICD-10-CM | POA: Diagnosis not present

## 2014-07-07 DIAGNOSIS — M150 Primary generalized (osteo)arthritis: Secondary | ICD-10-CM | POA: Diagnosis not present

## 2014-07-07 DIAGNOSIS — Z0000 Encounter for general adult medical examination without abnormal findings: Secondary | ICD-10-CM | POA: Diagnosis not present

## 2014-07-07 DIAGNOSIS — E782 Mixed hyperlipidemia: Secondary | ICD-10-CM | POA: Diagnosis not present

## 2014-07-07 DIAGNOSIS — R42 Dizziness and giddiness: Secondary | ICD-10-CM | POA: Diagnosis not present

## 2014-07-07 DIAGNOSIS — I10 Essential (primary) hypertension: Secondary | ICD-10-CM | POA: Diagnosis not present

## 2014-07-28 DIAGNOSIS — H5210 Myopia, unspecified eye: Secondary | ICD-10-CM | POA: Diagnosis not present

## 2014-07-28 DIAGNOSIS — H1852 Epithelial (juvenile) corneal dystrophy: Secondary | ICD-10-CM | POA: Diagnosis not present

## 2014-07-28 DIAGNOSIS — Z0100 Encounter for examination of eyes and vision without abnormal findings: Secondary | ICD-10-CM | POA: Diagnosis not present

## 2014-08-17 ENCOUNTER — Telehealth: Payer: Self-pay | Admitting: Cardiology

## 2014-08-18 NOTE — Telephone Encounter (Signed)
Close encounter 

## 2014-08-20 ENCOUNTER — Ambulatory Visit (INDEPENDENT_AMBULATORY_CARE_PROVIDER_SITE_OTHER): Payer: Commercial Managed Care - HMO | Admitting: Cardiology

## 2014-08-20 ENCOUNTER — Encounter: Payer: Self-pay | Admitting: Cardiology

## 2014-08-20 VITALS — BP 126/72 | HR 72 | Ht 71.0 in | Wt 195.5 lb

## 2014-08-20 DIAGNOSIS — R5383 Other fatigue: Secondary | ICD-10-CM

## 2014-08-20 DIAGNOSIS — I4821 Permanent atrial fibrillation: Secondary | ICD-10-CM

## 2014-08-20 DIAGNOSIS — I350 Nonrheumatic aortic (valve) stenosis: Secondary | ICD-10-CM | POA: Diagnosis not present

## 2014-08-20 DIAGNOSIS — I482 Chronic atrial fibrillation: Secondary | ICD-10-CM

## 2014-08-20 DIAGNOSIS — I2510 Atherosclerotic heart disease of native coronary artery without angina pectoris: Secondary | ICD-10-CM | POA: Diagnosis not present

## 2014-08-20 DIAGNOSIS — Z7901 Long term (current) use of anticoagulants: Secondary | ICD-10-CM

## 2014-08-20 DIAGNOSIS — E785 Hyperlipidemia, unspecified: Secondary | ICD-10-CM

## 2014-08-20 DIAGNOSIS — I10 Essential (primary) hypertension: Secondary | ICD-10-CM

## 2014-08-20 NOTE — Progress Notes (Signed)
PATIENT: Richard Simon MRN: [id number]  DOB: 1930-02-03   DOV:08/22/2014 PCP: Merrilee Seashore, MD  Clinic Note: Chief Complaint  Patient presents with  . Follow-up    dizzy after taking medication the morning, would like to know if dosage can be lowered.no chest discomfort or leg pain or swelling  . Coronary Artery Disease    Status post four-vessel CABG.  Marland Kitchen Atrial Fibrillation  . Aortic Stenosis    Moderate to severe   HPI: Richard Simon is a 81 y.o. male with a PMH below who presents today for 12 month followup. He is a history of coronary disease status post CABG in 2004 & negative Myoview in 2012, ~ Moderate Aortic Stenosis, and chronic/peristent rate controlled atrial fibrillation on Pradaxa.   Last clinic visit was November 2015, he has had an echocardiogram to reassess aortic stenosis since then (reviewed below) - slight progression. He noted positional/orthostatic dizziness -reduced ACE inhibitor to 20 mg.  Interval History:  Richard Simon presents feeling relatively well without any major cardiac complaints. No Symptoms of rapid or irregular heart beats.  Every so often notes a "nervousness" sensation - especially in his hands, but denies any rapid irregular heartbeat sensations. He still does have occasional positional/orthostatic dizziness, but denies any syncope or near syncope. He tries to walk about a mile a day for exercise and continues to be asymptomatic from an atrial fibrillation standpoint. He denies any significant episodes of rapid heartbeats or palpitations. Mild occasional lower extremity edema that improves with leg elevation.    the remainder of his Cardiovascular ROS is as follows : no chest pain or dyspnea on exertion positive for - Orthostatic dizziness and exercise fatigue. negative for - edema, loss of consciousness, orthopnea, palpitations, paroxysmal nocturnal dyspnea, rapid heart rate, shortness of breath or Near-syncope, TIA/amaurosis fugax. Melena, hematochezia, hematuria,  epistaxis. Myalgias/arthralgias from statin. He tolerating Pradaxa without any nausea or significant bleeding. He brings an extensive blood pressure diary that shows most low pressures range from 110 to the 130s with some ranging into the 100-110 range and some intermittent 140 range. The most part very well controlled. Average blood pressure is 127/76 with a heart rate of 83 bpm.   Past Medical History  Diagnosis Date  . CAD (coronary artery disease) 2004    S/P 4-vessel CABG in 2004  . S/P CABG x 4 2004    Post CABG  . Moderate to severe aortic stenosis 01/2013; 06/2014    Echo Eval: a) 01/2013: Mod calcified AoV w/ reduced mobility of R cusp. Mod-Severe stenosis. Mn-Pk gradient 25 mmHg--39 mmHg. AVA ~ 0.79-0.83 cm. Moderate MR. Normal LV size and function EF 55-60%. Elevated LAP. Mild pulmonary attention. Mild moderately dilated left atrium.;; b) 06/2014: EF 60-65%. Mod-Severe AS, AVA~0.69, Mn-Pk gradient 32 mmHg -- 55 mmHg, severe LA dilation, moderate-severe RA d  . Atrial fibrillation, permanent     Rate controlled. Anticoagulation with Pradaxa.  . Hypertension      very well controlled  . Dyslipidemia, goal LDL below 70     Prior Cardiac Evaluation and Past Surgical History: Past Surgical History  Procedure Laterality Date  . Cardiac catheterization  12/11/2002    Recommendation-CABG; 80% mid LAD, 70% distal LAD. Mid circumflex 95%, OM1 95%. AV groove circumflex 95%. Proximal RCA 100%  . Coronary artery bypass graft  12/15/2002    LIMA-LAD, SVG-diagonal, SVG-OM1-OM2 sequential  . Leane Call  December 2012    Fixed basal inferior diaphragmatic attenuation versusinfarct, no ischemia  . Transthoracic echocardiogram  September 2013    Normal EF. > 55%; moderate aortic valve stenosis with mild AI. Gradient: Peak 40 mmHg, mean 24 military. Moderate LA dilation. Moderate TR with RV pressure 30-40 mmHg. Mild MR.  . Transthoracic echocardiogram  October 2014    Moderate-severe AS with  mild AI. Peak and mean gradients no significant change: 39 mmHg and 25 mmHg;; mild LV dilation with EF 55-60%. Elevated LAP. Moderate MR. Mild to moderate LA dilation. Mildly elevated RV pressure.  . Transthoracic echocardiogram  March 2016    EF 60-65%. Mod-Severe AS, AVA~0.69, Mn-Pk gradient 32 mmHg -- 55 mmHg, severe LA dilation, moderate-severe RA dilation: Progression of disease   No Known Allergies  Current Outpatient Prescriptions  Medication Sig Dispense Refill  . aspirin EC 81 MG tablet Take 81 mg by mouth daily.    Marland Kitchen atenolol (TENORMIN) 50 MG tablet Take 50 mg by mouth daily.    . dabigatran (PRADAXA) 150 MG CAPS capsule Take 1 capsule (150 mg total) by mouth 2 (two) times daily. 180 capsule 3  . lisinopril (PRINIVIL,ZESTRIL) 40 MG tablet Take 0.5 tablets (20 mg total) by mouth daily. 45 tablet 2  . Omega-3 Fatty Acids (FISH OIL) 1200 MG CAPS Take 1,200 mg by mouth 2 (two) times daily.    . rosuvastatin (CRESTOR) 10 MG tablet Take 1 tablet (10 mg total) by mouth daily. 90 tablet 3  . Acetaminophen 500 MG coapsule Take 1 tablet by mouth daily as needed.    . diltiazem (CARDIZEM CD) 240 MG 24 hr capsule Take 1 capsule (240 mg total) by mouth daily. 90 capsule 3  . Incontinence Supply Disposable (BLADDER CONTROL PADS EXTRA) MISC      No current facility-administered medications for this visit.    History   Social History Narrative   Married. Father of 58, grandfather 91, great-grandfather 3.   Very active, but without routine exercise. Does yard work and intermittent walking, but nothing routine.   Does not smoke. Occasional alcohol.   ROS: A comprehensive Review of Systems - was performed Review of Systems  Constitutional: Positive for malaise/fatigue (At the end of the day feels tired).  Respiratory: Positive for shortness of breath (every now & then has spells.  also with allergies).   Cardiovascular: Negative for claudication and leg swelling.  Gastrointestinal: Positive for  nausea (some AM nausea - improved with Ginger root). Negative for blood in stool and melena.  Genitourinary: Negative for flank pain.  Musculoskeletal: Positive for joint pain (tired or stretched sensation behind knees).  All other systems reviewed and are negative.  PHYSICAL EXAM BP 126/72 mmHg  Pulse 72  Ht 5\' 11"  (1.803 m)  Wt 88.678 kg (195 lb 8 oz)  BMI 27.28 kg/m2 General appearance: alert, cooperative, appears stated age, no distress and Very well-nourished and well-groomed. Answers questions appropriately. Normal mood and affect. Neck: no adenopathy, soft right carotid bruit -- versus referred aortic murmur., no JVD and supple, symmetrical, trachea midline Lungs:CTAB, normal percussion bilaterally and Nonlabored temperature movement Heart: Irregularly irregular rate/rhythm, normal S1 with a soft S2. No S4 gallop. 2-3/6 crescendo-decrescendo high pitch mid peaking SEM at RUSB --> Carotids, 1/6 DM heard at RUSB & 1-to/6 HSM at apex. Nondisplaced PMI. Abdomen: soft, non-tender; bowel sounds normal; no masses,  no organomegaly Extremities: extremities normal, atraumatic, no cyanosis or edema Pulses: 2+ and symmetric Neurologic: Grossly normal  YSA:[id number] today: Yes Rate: 72, Rhythm: A. fib, nonspecific ST-T changes,  Recent Labs: Recently checked by PCP who is  following the dyslipidemia.  ASSESSMENT / PLAN Problem List Items Addressed This Visit    Aortic stenosis, moderate (Chronic)    After being stable for 2 years, we waited little bit longer and there has been progression of disease. Will recheck prior to the second follow-up visit next year. The murmur doesn't seem that much different but there is a slight more delay in carotid upstroke and later peak. Remains asymptomatic - continue to follow for now. May be a difficult patient to consider open aortic valve replacement with his history of CABG. Discussed concerning symptoms for aortic stenosis including angina, heart  failure and syncope/near syncope. I will recheck him for symptoms of 6 months and recheck echocardiogram in the spring of next year unless symptoms worsen. By that time I think we may be approaching severe AS.      Relevant Orders   EKG 12-Lead (Completed)   Atrial fibrillation, permanent (Chronic)    Rate control with beta blocker and calcium channel blocker. Anticoagulated with Pradaxa. No bleeding issues. No significant nausea.      Relevant Orders   EKG 12-Lead (Completed)   Current use of long term anticoagulation (Chronic)    On Pradaxa. Tolerating well.      Dyslipidemia, goal LDL below 70 (Chronic)    On Crestor, labs monitored by PCP.      Relevant Orders   EKG 12-Lead (Completed)   Essential hypertension (Chronic)   Relevant Orders   EKG 12-Lead (Completed)   Fatigue due to treatment (Chronic)    He takes his beta blocker in the morning and think that may be leading to some fatigue. We'll switch calcium channel blocker to evening a beta blocker the morning hopefully this will take advantage of the natural sedation effect of the beta blocker at nighttime as opposed to the day. Some of this may just be related to age. We discussed sleep etiquette      Multivessel Native CAD - s/p CABGx4: LIMA to LAD, SVG-Diag, seqSVG-OM1-OM2 - Primary (Chronic)    No active symptoms with daily walking and yard work. Last Myoview was in 2012. With it being as asymptomatic as he is, I will hold off on rechecking a Myoview until next year's echo, since it didn't valve has become worse we may be looking to do right and left heart catheterization with grafts and therefore Myoview would not be necessary.  He is on a beta blocker, and ACE inhibitor along with calcium channel blocker. Taking aspirin and statin.      Relevant Orders   EKG 12-Lead (Completed)      Followup: 6 months Echo in 1 yr.   Leonie Man, M.D., M.S. Interventional Cardiologist   Pager #  [phone]

## 2014-08-20 NOTE — Patient Instructions (Signed)
SWITCH TAKING ATENOLOL AT BEDTIME AND TAKE DILTIAZEM IN THE MORNING. START TOMORROW WITH MEDICATIONS - HOLD DILTIAZEM NIGHT.  CHECK BLOOD PRESSURE IN AFTERNOON IF BLOOD PRESSURE IS LESS THAN 110  ( TOP NUMBER)-DO NOT TAKE LISINOPRIL THAT EVENING.  Your physician wants you to follow-up in [place].  You will receive a reminder letter in the mail two months in advance. If you don't receive a letter, please call our office to schedule the follow-up appointment.

## 2014-08-22 ENCOUNTER — Encounter: Payer: Self-pay | Admitting: Cardiology

## 2014-08-22 DIAGNOSIS — R5383 Other fatigue: Secondary | ICD-10-CM | POA: Insufficient documentation

## 2014-08-22 NOTE — Assessment & Plan Note (Signed)
After being stable for 2 years, we waited little bit longer and there has been progression of disease. Will recheck prior to the second follow-up visit next year. The murmur doesn't seem that much different but there is a slight more delay in carotid upstroke and later peak. Remains asymptomatic - continue to follow for now. May be a difficult patient to consider open aortic valve replacement with his history of CABG. Discussed concerning symptoms for aortic stenosis including angina, heart failure and syncope/near syncope. I will recheck him for symptoms of 6 months and recheck echocardiogram in the spring of next year unless symptoms worsen. By that time I think we may be approaching severe AS.

## 2014-08-22 NOTE — Assessment & Plan Note (Addendum)
No active symptoms with daily walking and yard work. Last Myoview was in 2012. With it being as asymptomatic as he is, I will hold off on rechecking a Myoview until next year's echo, since it didn't valve has become worse we may be looking to do right and left heart catheterization with grafts and therefore Myoview would not be necessary.  He is on a beta blocker, and ACE inhibitor along with calcium channel blocker. Taking aspirin and statin.

## 2014-08-22 NOTE — Assessment & Plan Note (Signed)
He takes his beta blocker in the morning and think that may be leading to some fatigue. We'll switch calcium channel blocker to evening a beta blocker the morning hopefully this will take advantage of the natural sedation effect of the beta blocker at nighttime as opposed to the day. Some of this may just be related to age. We discussed sleep etiquette

## 2014-08-22 NOTE — Assessment & Plan Note (Signed)
On Pradaxa. Tolerating well.

## 2014-08-22 NOTE — Assessment & Plan Note (Signed)
On Crestor, labs monitored by PCP.

## 2014-08-22 NOTE — Assessment & Plan Note (Signed)
Rate control with beta blocker and calcium channel blocker. Anticoagulated with Pradaxa. No bleeding issues. No significant nausea.

## 2015-03-25 ENCOUNTER — Other Ambulatory Visit: Payer: Self-pay | Admitting: *Deleted

## 2015-03-25 MED ORDER — DABIGATRAN ETEXILATE MESYLATE 150 MG PO CAPS
150.0000 mg | ORAL_CAPSULE | Freq: Two times a day (BID) | ORAL | Status: DC
Start: 2015-03-25 — End: 2015-04-20

## 2015-03-29 ENCOUNTER — Telehealth: Payer: Self-pay | Admitting: Cardiology

## 2015-03-29 NOTE — Telephone Encounter (Signed)
Please call, concerning his Pradaxa.

## 2015-03-29 NOTE — Telephone Encounter (Signed)
spoke with patient Patient would like to bring patient assistance form to be filled out for [facility] patient aware will be about 2 weeks for information to be completed  Patient states he will stop office this week.

## 2015-04-20 ENCOUNTER — Telehealth: Payer: Self-pay | Admitting: Cardiology

## 2015-04-20 MED ORDER — DABIGATRAN ETEXILATE MESYLATE 150 MG PO CAPS: 150.0000 mg | ORAL_CAPSULE | Freq: Two times a day (BID) | ORAL | Status: DC

## 2015-04-20 NOTE — Telephone Encounter (Signed)
Returned call to patient.He stated he needed a 90 day pradaxa prescription to mail with his application.Advised Dr.Jordan in office 04/21/15, I will leave prescription at front desk.

## 2015-04-20 NOTE — Telephone Encounter (Signed)
Pt called in wanting to speak with Richard Simon about his application that needed to be filled out for his Pradaxa. He says that he received the application but did not receive the prescription. Please f/u with him  Thanks

## 2015-04-20 NOTE — Addendum Note (Signed)
Addended by: Golden Hurter D on: 04/20/2015 10:21 AM   Modules accepted: Orders

## 2015-04-20 NOTE — Telephone Encounter (Signed)
Returned call to patient.Advised he is Dr.Harding's patient.Dr.Harding will be in office 04/22/15.I will give pradaxa prescription to [facility] for a signature.Advised will leave prescription at front desk for pickup.

## 2015-04-23 ENCOUNTER — Telehealth: Payer: Self-pay | Admitting: Cardiology

## 2015-04-23 NOTE — Telephone Encounter (Signed)
Richard Simon at 04/20/2015 9:41 AM     Status: Signed       Expand All Collapse All   Pt called in wanting to speak with [name] about his application that needed to be filled out for his Pradaxa. He says that he received the application but did not receive the prescription. Please f/u with him       According to previous telephone note patient needs pradaxa prescription.  I am forwarding this to Dr Allison Quarry nurse to follow up.

## 2015-04-23 NOTE — Telephone Encounter (Signed)
Pt wants to know if his prescription for his Pradaxa is ready?

## 2015-04-23 NOTE — Telephone Encounter (Signed)
Notified patient ,prescription is ready for pick-up

## 2015-05-25 ENCOUNTER — Encounter: Payer: Self-pay | Admitting: Cardiology

## 2015-05-25 ENCOUNTER — Ambulatory Visit (INDEPENDENT_AMBULATORY_CARE_PROVIDER_SITE_OTHER): Payer: Commercial Managed Care - HMO | Admitting: Cardiology

## 2015-05-25 VITALS — BP 132/76 | HR 96 | Ht 71.0 in | Wt 188.7 lb

## 2015-05-25 DIAGNOSIS — I482 Chronic atrial fibrillation: Secondary | ICD-10-CM | POA: Diagnosis not present

## 2015-05-25 DIAGNOSIS — E785 Hyperlipidemia, unspecified: Secondary | ICD-10-CM | POA: Diagnosis not present

## 2015-05-25 DIAGNOSIS — I4821 Permanent atrial fibrillation: Secondary | ICD-10-CM

## 2015-05-25 DIAGNOSIS — I350 Nonrheumatic aortic (valve) stenosis: Secondary | ICD-10-CM | POA: Diagnosis not present

## 2015-05-25 DIAGNOSIS — I2510 Atherosclerotic heart disease of native coronary artery without angina pectoris: Secondary | ICD-10-CM | POA: Diagnosis not present

## 2015-05-25 DIAGNOSIS — R5383 Other fatigue: Secondary | ICD-10-CM

## 2015-05-25 DIAGNOSIS — I10 Essential (primary) hypertension: Secondary | ICD-10-CM

## 2015-05-25 NOTE — Progress Notes (Signed)
PCP: Merrilee Seashore, MD  Clinic Note: Chief Complaint  Patient presents with  . Follow-up    10 month// pt c/o occasional tingling in chest, lightheadedness in the afternoon after taking meds  . Coronary Artery Disease  . Aortic Stenosis  . Atrial Fibrillation    Chronic/persistent    HPI: Richard Simon is a 82 y.o. male with a PMH below who presents today for 9 month f/u of CAD-CABG (2004, Negative Myoview 2012), Mod-Severe AS, Chronic/persistent Afib - rate controlled & on Pradaxa.Richard Simon was last seen in May 2016-- was doing well.    Recent Hospitalizations: None  Studies Reviewed: Last Echo was March 2016 -- plan was to f/u this year & hold off oc Myoview until we determine progression of stenosis.  Interval History: Richard Simon presents today doing relatively well. He still walks about a mile every day. He says he has an occasional "stinging sensation in his chest that is, tingling feeling ". It usually when he is tired in the afternoon that he notes this. But is not associated with exertion. Was does not notice having rapid or irregular heartbeat/palpitations. He still has some lightheadedness and with a dizziness at the end of the day when he is tired, has not had syncope or near syncope. With his walking, he denies any exertional dyspnea or chest tightness/pressure as compared to his baseline. No PND, orthopnea or edema.  He notes his heart rate is usually relatively well-controlled in the 82-100 range at rest. He can increase it in the 130s with exertion. No bleeding complications from Pradaxa. Tolerating relatively well without difficulty. No fatigue or significant myalgias.  He is due to have his labs checked next month by his PCP.  No syncope/near syncope, or TIA/amaurosis fugax symptoms. No melena, hematochezia, hematuria, or epstaxis. No claudication.  ROS: A comprehensive was performed. Review of Systems  Constitutional: Negative for malaise/fatigue.  HENT:  Negative for congestion.   Eyes: Negative for blurred vision.  Respiratory: Negative for cough and shortness of breath.   Cardiovascular: Negative.        Per history of present illness.  Gastrointestinal: Negative for heartburn, abdominal pain and constipation.  Genitourinary: Negative for dysuria.  Musculoskeletal: Positive for joint pain (Mild arthralgias.). Negative for myalgias.  Neurological: Positive for dizziness (Positional). Negative for headaches.  Endo/Heme/Allergies: Does not bruise/bleed easily.  Psychiatric/Behavioral: Negative.   All other systems reviewed and are negative.    Past Medical History  Diagnosis Date  . CAD (coronary artery disease) 2004    S/P 4-vessel CABG in 2004  . S/P CABG x 4 2004    Post CABG  . Moderate to severe aortic stenosis 01/2013; 06/2014    Echo Eval: a) 01/2013: Mod calcified AoV w/ reduced mobility of R cusp. Mod-Severe stenosis. Mn-Pk gradient 25 mmHg--39 mmHg. AVA ~ 0.79-0.83 cm. Moderate MR. Normal LV size and function EF 55-60%. Elevated LAP. Mild pulmonary attention. Mild moderately dilated left atrium.;; b) 06/2014: EF 60-65%. Mod-Severe AS, AVA~0.69, Mn-Pk gradient 32 mmHg -- 55 mmHg, severe LA dilation, moderate-severe RA d  . Atrial fibrillation, permanent ([facility])     Rate controlled. Anticoagulation with Pradaxa.  . Hypertension      very well controlled  . Dyslipidemia, goal LDL below 70     Past Surgical History  Procedure Laterality Date  . Cardiac catheterization  12/11/2002    Recommendation-CABG; 80% mid LAD, 70% distal LAD. Mid circumflex 95%, OM1 95%. AV groove circumflex 95%. Proximal RCA 100%  .  Coronary artery bypass graft  12/15/2002    LIMA-LAD, SVG-diagonal, SVG-OM1-OM2 sequential  . Richard Simon  December 2012    Fixed basal inferior diaphragmatic attenuation versusinfarct, no ischemia  . Transthoracic echocardiogram  September 2013    Normal EF. > 55%; moderate aortic valve stenosis with mild AI. Gradient:  Peak 40 mmHg, mean 24 military. Moderate LA dilation. Moderate TR with RV pressure 30-40 mmHg. Mild MR.  . Transthoracic echocardiogram  October 2014    Moderate-severe AS with mild AI. Peak and mean gradients no significant change: 39 mmHg and 25 mmHg;; mild LV dilation with EF 55-60%. Elevated LAP. Moderate MR. Mild to moderate LA dilation. Mildly elevated RV pressure.  . Transthoracic echocardiogram  March 2016    EF 60-65%. Mod-Severe AS, AVA~0.69, Mn-Pk gradient 32 mmHg -- 55 mmHg, severe LA dilation, moderate-severe RA dilation: Progression of disease   Prior to Admission medications   Medication Sig Start Date End Date Taking? Authorizing Provider  Acetaminophen 500 MG coapsule Take 1 tablet by mouth daily as needed. 05/18/14  Yes Historical Provider, MD  aspirin EC 81 MG tablet Take 81 mg by mouth daily.   Yes Historical Provider, MD  atenolol (TENORMIN) 50 MG tablet Take 50 mg by mouth daily. 01/08/13  Yes Historical Provider, MD  co-enzyme Q-10 30 MG capsule Take 1 capsule by mouth daily. 04/28/15  Yes Historical Provider, MD  dabigatran (PRADAXA) 150 MG CAPS capsule Take 1 capsule (150 mg total) by mouth 2 (two) times daily. 04/20/15  Yes Leonie Man, MD  diltiazem (CARDIZEM CD) 240 MG 24 hr capsule Take 1 capsule (240 mg total) by mouth daily. 05/18/14  Yes Leonie Man, MD  folic acid (FOLVITE) Q000111Q MCG tablet Take 1 tablet by mouth daily. 03/02/15  Yes Historical Provider, MD  Incontinence Supply Disposable (BLADDER CONTROL PADS EXTRA) [place]  08/03/14  Yes Historical Provider, MD  lisinopril (PRINIVIL,ZESTRIL) 10 MG tablet Take 1 tablet by mouth at bedtime. 04/24/15  Yes Historical Provider, MD  Omega-3 Fatty Acids (FISH OIL) 1200 MG CAPS Take 1,200 mg by mouth 2 (two) times daily.   Yes Historical Provider, MD  rosuvastatin (CRESTOR) 10 MG tablet Take 1 tablet (10 mg total) by mouth daily. 03/20/14  Yes Leonie Man, MD    No Known Allergies   Social History   Social History  .  Marital Status: Married    Spouse Name: N/A  . Number of Children: N/A  . Years of Education: N/A   Social History Main Topics  . Smoking status: Former Smoker    Quit date: 02/12/1975  . Smokeless tobacco: Never Used  . Alcohol Use: 8.4 oz/week    14 Glasses of wine per week  . Drug Use: None  . Sexual Activity: Not Asked   Other Topics Concern  . None   Social History Narrative   Married. Father of 61, grandfather 74, great-grandfather 3.   Very active, but without routine exercise. Does yard work and intermittent walking, but nothing routine.   Does not smoke. Occasional alcohol.   History reviewed. No pertinent family history.   Wt Readings from Last 3 Encounters:  05/25/15 188 lb 11.2 oz (85.594 kg)  08/20/14 195 lb 8 oz (88.678 kg)  03/16/14 193 lb 4.8 oz (87.68 kg)    PHYSICAL EXAM BP 132/76 mmHg  Pulse 96  Ht 5\' 11"  (1.803 m)  Wt 188 lb 11.2 oz (85.594 kg)  BMI 26.33 kg/m2 General appearance: alert, cooperative, appears stated  age, no distress and Very well-nourished and well-groomed. Answers questions appropriately. Normal mood and affect. Neck: no adenopathy, soft right carotid bruit -- versus referred aortic murmur., no JVD and supple, symmetrical, trachea midline Lungs:CTAB, normal percussion bilaterally and Nonlabored temperature movement Heart: Irregularly irregular rate/rhythm, normal S1 with a soft S2. No S4 gallop. 2-3/6 crescendo-decrescendo high pitch mid peaking SEM at RUSB --> Carotids, 1/6 DM heard at RUSB & 1-2/6 HSM at apex. Nondisplaced PMI. Abdomen: soft, non-tender; bowel sounds normal; no masses, no organomegaly Extremities: extremities normal, atraumatic, no cyanosis or edema Pulses: 2+ and symmetric Neurologic: Grossly normal    Adult ECG Report  Rate: 96 ;  Rhythm: atrial fibrillation and Somewhat acute T waves noted. Otherwise normal axis, durations.;   Narrative Interpretation: Rate is increased but otherwise stable EKG.   Other  studies Reviewed: Additional studies/ records that were reviewed today include:  Recent Labs:  No recent labs.   ASSESSMENT / PLAN: Problem List Items Addressed This Visit    Multivessel Native CAD - s/p CABGx4: LIMA to LAD, SVG-Diag, seqSVG-OM1-OM2 (Chronic)    He continues to do well without any active anginal symptoms. He is on diltiazem and beta blocker for rate control. Also deriving benefit from an antianginal standpoint. He is on low-dose statin along with aspirin.  The original plan was to hold off on stress test, however it appears it is valve is not progressing that rapidly, and no symptoms. I would like to get a screening Myoview prior to his next follow-up visit in November. That'll be 5 years from his last stress test. There was a fixed basal inferior defect that was thought to be either infarct versus diaphragmatic attenuation but no ischemia.      Relevant Medications   lisinopril (PRINIVIL,ZESTRIL) 10 MG tablet   Other Relevant Orders   EKG 12-Lead   ECHOCARDIOGRAM COMPLETE   Myocardial Perfusion Imaging   Fatigue due to treatment (Chronic)    Less notable. We discussed having switched his beta blocker to the evening leaving calcium channel blocker in the morning.       Essential hypertension (Chronic)    Blood pressure well controlled today on current medications. Taking lisinopril at bedtime. Calcium channel blocker and beta blocker at opposite times of the day.      Relevant Medications   lisinopril (PRINIVIL,ZESTRIL) 10 MG tablet   Other Relevant Orders   EKG 12-Lead   ECHOCARDIOGRAM COMPLETE   Myocardial Perfusion Imaging   Dyslipidemia, goal LDL below 70 (Chronic)    Labs are monitored by PCP for recent check. I have not seen any recent labs, and when asked this is PCP please send them. He is on stable dose of Crestor.      Relevant Medications   lisinopril (PRINIVIL,ZESTRIL) 10 MG tablet   Other Relevant Orders   EKG 12-Lead   ECHOCARDIOGRAM COMPLETE     Myocardial Perfusion Imaging   Atrial fibrillation, permanent ([facility]) (Chronic)    Remains on beta blocker and calcium channel blocker for rate control. Rate is not as well-controlled today as it was last time. We can potentially increase diltiazem if necessary. This patients CHA2DS2-VASc Score and unadjusted Ischemic Stroke Rate (% per year) is equal to 4.8 % stroke rate/year from a score of 4 (age 82, hypertension, CAD) -- anticoagulated with Pradaxa. No bleeding.      Relevant Medications   lisinopril (PRINIVIL,ZESTRIL) 10 MG tablet   Other Relevant Orders   EKG 12-Lead   ECHOCARDIOGRAM COMPLETE  Myocardial Perfusion Imaging   Aortic stenosis, moderate - Primary (Chronic)    Does appear that his echo has gotten steadily worse. At this point. Follow-up annually. He will be due in March of this year. The murmur does not seem to be that mild and the carotid upstroke is not significantly delayed.  Again we reviewed concerning symptoms for AS including angina, heart failure and syncope/near syncope. With his prior CABG, he may be a candidate for TAVR versus repeat open procedure.      Relevant Medications   lisinopril (PRINIVIL,ZESTRIL) 10 MG tablet   Other Relevant Orders   EKG 12-Lead   ECHOCARDIOGRAM COMPLETE   Myocardial Perfusion Imaging      Current medicines are reviewed at length with the patient today. (+/- concerns) none  The following changes have been made: none   Studies Ordered:   Orders Placed This Encounter  Procedures  . Myocardial Perfusion Imaging  . EKG 12-Lead  . ECHOCARDIOGRAM COMPLETE    ROV Nov 2017 -- after Screening Myoview.    Leonie Man, M.D., M.S. Interventional Cardiologist   Pager # [phone] Phone # [phone] [address]. [place], [place] [address]

## 2015-05-25 NOTE — Patient Instructions (Signed)
SCHEDULE AFTER 06/24/15--Your physician has requested that you have an echocardiogram [facility]. Echocardiography is a painless test that uses sound waves to create images of your heart. It provides your doctor with information about the size and shape of your heart and how well your heart's chambers and valves are working. This procedure takes approximately one hour. There are no restrictions for this procedure.   SCHEDULE FOR NOV 2017--Your physician has requested that you have en exercise stress myoview. For further information please visit [email or web address]. Please follow instruction sheet, as given.  NO CHANGE WITH CURRENT MEDICATIONS    Your physician wants you to follow-up in  NOV 2017 WITH DR HARDING (AFTER STRESS TEST) - 30 MIN APPT.   You will receive a reminder letter in the mail two months in advance. If you don't receive a letter, please call our office to schedule the follow-up appointment.   If you need a refill on your cardiac medications before your next appointment, please call your pharmacy.

## 2015-05-27 ENCOUNTER — Encounter: Payer: Self-pay | Admitting: Cardiology

## 2015-05-27 NOTE — Assessment & Plan Note (Signed)
He continues to do well without any active anginal symptoms. He is on diltiazem and beta blocker for rate control. Also deriving benefit from an antianginal standpoint. He is on low-dose statin along with aspirin.  The original plan was to hold off on stress test, however it appears it is valve is not progressing that rapidly, and no symptoms. I would like to get a screening Myoview prior to his next follow-up visit in November. That'll be 5 years from his last stress test. There was a fixed basal inferior defect that was thought to be either infarct versus diaphragmatic attenuation but no ischemia.

## 2015-05-27 NOTE — Assessment & Plan Note (Addendum)
Less notable. We discussed having switched his beta blocker to the evening leaving calcium channel blocker in the morning.

## 2015-05-27 NOTE — Assessment & Plan Note (Signed)
Labs are monitored by PCP for recent check. I have not seen any recent labs, and when asked this is PCP please send them. He is on stable dose of Crestor.

## 2015-05-27 NOTE — Assessment & Plan Note (Signed)
Blood pressure well controlled today on current medications. Taking lisinopril at bedtime. Calcium channel blocker and beta blocker at opposite times of the day.

## 2015-05-27 NOTE — Assessment & Plan Note (Signed)
Does appear that his echo has gotten steadily worse. At this point. Follow-up annually. He will be due in March of this year. The murmur does not seem to be that mild and the carotid upstroke is not significantly delayed.  Again we reviewed concerning symptoms for AS including angina, heart failure and syncope/near syncope. With his prior CABG, he may be a candidate for TAVR versus repeat open procedure.

## 2015-05-27 NOTE — Assessment & Plan Note (Addendum)
Remains on beta blocker and calcium channel blocker for rate control. Rate is not as well-controlled today as it was last time. We can potentially increase diltiazem if necessary. This patients CHA2DS2-VASc Score and unadjusted Ischemic Stroke Rate (% per year) is equal to 4.8 % stroke rate/year from a score of 4 (age 82, hypertension, CAD) -- anticoagulated with Pradaxa. No bleeding.

## 2015-05-31 ENCOUNTER — Telehealth: Payer: Self-pay | Admitting: Cardiology

## 2015-05-31 ENCOUNTER — Other Ambulatory Visit: Payer: Self-pay | Admitting: *Deleted

## 2015-05-31 MED ORDER — ATORVASTATIN CALCIUM 10 MG PO TABS: 10.0000 mg | ORAL_TABLET | Freq: Every day | ORAL | Status: DC

## 2015-05-31 NOTE — Telephone Encounter (Signed)
Pt calling wanting to inform Sharon,RN,  Dr. Allison Quarry assistant, that pt's pharmacy is faxing a request for a refill of rosuvastatin 10 mg tablet, from [facility] and pt would like for Ivin Booty to give him a call back. Please advise

## 2015-05-31 NOTE — Telephone Encounter (Signed)
SPOKE TO WIFE  SHE STATES PATIENT WILL NOT BE BACK UNTIL AFTER 6 PM TODAY. RN INFORMED HER WILL CALL BACK, on Wednesday, will be out of office toorrow SHE VERBALIZED UNDERSTANDING

## 2015-06-02 NOTE — Telephone Encounter (Signed)
Left message to call back  

## 2015-06-04 MED ORDER — ROSUVASTATIN CALCIUM 10 MG PO TABS: 10.0000 mg | ORAL_TABLET | Freq: Every day | ORAL | Status: DC

## 2015-06-04 NOTE — Telephone Encounter (Signed)
Patient request refill of generic crestor be sent to [facility]  E-SENT NEW PRESCRIPTION  90 SUPPLY WITH X 3 REFILLS PATIENT AWARE.

## 2015-06-10 ENCOUNTER — Telehealth: Payer: Self-pay | Admitting: *Deleted

## 2015-06-10 NOTE — Telephone Encounter (Signed)
[facility] pharmacy left a voicemail on the refill line stating that the patient has an active rx for atorvastatin 10mg  and rosuvastatin 10mg . They need a call back at [phone] or a fax to [phone] to give clarification.

## 2015-06-14 NOTE — Telephone Encounter (Signed)
Spoke to representative [employer] tech Discontinue atorvastatin 10 mg . Patient is taking Rosuvastatin 10 mg  Caryl Pina states information has been taken care of 07/08/15.

## 2015-06-16 ENCOUNTER — Other Ambulatory Visit: Payer: Self-pay | Admitting: Cardiology

## 2015-06-16 NOTE — Telephone Encounter (Signed)
REFILL 

## 2015-07-20 ENCOUNTER — Ambulatory Visit (HOSPITAL_COMMUNITY): Payer: Commercial Managed Care - HMO | Attending: Cardiology

## 2015-07-20 DIAGNOSIS — I350 Nonrheumatic aortic (valve) stenosis: Secondary | ICD-10-CM | POA: Diagnosis not present

## 2015-07-20 DIAGNOSIS — E785 Hyperlipidemia, unspecified: Secondary | ICD-10-CM | POA: Diagnosis not present

## 2015-07-20 DIAGNOSIS — I352 Nonrheumatic aortic (valve) stenosis with insufficiency: Secondary | ICD-10-CM | POA: Insufficient documentation

## 2015-07-20 DIAGNOSIS — I2510 Atherosclerotic heart disease of native coronary artery without angina pectoris: Secondary | ICD-10-CM | POA: Insufficient documentation

## 2015-07-20 DIAGNOSIS — I340 Nonrheumatic mitral (valve) insufficiency: Secondary | ICD-10-CM | POA: Diagnosis not present

## 2015-07-20 DIAGNOSIS — I119 Hypertensive heart disease without heart failure: Secondary | ICD-10-CM | POA: Insufficient documentation

## 2015-07-20 DIAGNOSIS — I482 Chronic atrial fibrillation: Secondary | ICD-10-CM

## 2015-07-20 DIAGNOSIS — I10 Essential (primary) hypertension: Secondary | ICD-10-CM

## 2015-07-21 NOTE — Progress Notes (Signed)
Quick Note:  Echo results showed that the EF has stayed the same. The pump function looks the same. However the aortic valve has become more stenotic than before. He has gone from moderate-severe stenosis to severe stenosis. The peak gradient is gone from 32 up to 43 mmHg pre-disposed in the severe category.  At this point, we need to be monitoring for symptoms that are consistent with possible angina symptoms, heart failure symptoms or near syncope/syncope type symptoms.  I think he is still having follow-up in November. Is not having no symptoms. Status screening Myoview at that time. However maybe we can hold off on the Myoview until after I see him back. The reason for this is that if he is having some symptoms, I would prefer to go with a heart catheterization for pre-aVR evaluation.  Leonie Man, MD    ______

## 2015-07-26 ENCOUNTER — Telehealth: Payer: Self-pay | Admitting: *Deleted

## 2015-07-26 NOTE — Telephone Encounter (Signed)
Spoke to patient. Result given . Verbalized understanding Aware to look out for any reoccurring symptoms before Nov APPOINTMENT - will call for an appointment sooner if needed.

## 2015-07-26 NOTE — Telephone Encounter (Signed)
-----   Message from Leonie Man, MD sent at 07/21/2015 11:02 PM EDT ----- Echo results showed that the EF has stayed the same. The pump function looks the same. However the aortic valve has become more stenotic than before. He has gone from moderate-severe stenosis to severe stenosis. The peak gradient is gone from 32 up to 43 mmHg pre-disposed in the severe category.  At this point, we need to be monitoring for symptoms that are consistent with possible angina symptoms, heart failure symptoms or near syncope/syncope type symptoms.   I think he is still having follow-up in November. Is not having no symptoms. Status screening Myoview at that time. However maybe we can hold off on the Myoview until after I see him back. The reason for this is that if he is having some symptoms, I would prefer to go with a heart catheterization for pre-aVR evaluation.  Leonie Man, MD

## 2015-08-04 DIAGNOSIS — I10 Essential (primary) hypertension: Secondary | ICD-10-CM | POA: Diagnosis not present

## 2015-08-04 DIAGNOSIS — E782 Mixed hyperlipidemia: Secondary | ICD-10-CM | POA: Diagnosis not present

## 2015-08-11 DIAGNOSIS — I481 Persistent atrial fibrillation: Secondary | ICD-10-CM | POA: Diagnosis not present

## 2015-08-11 DIAGNOSIS — I10 Essential (primary) hypertension: Secondary | ICD-10-CM | POA: Diagnosis not present

## 2015-08-11 DIAGNOSIS — I2510 Atherosclerotic heart disease of native coronary artery without angina pectoris: Secondary | ICD-10-CM | POA: Diagnosis not present

## 2015-08-11 DIAGNOSIS — E782 Mixed hyperlipidemia: Secondary | ICD-10-CM | POA: Diagnosis not present

## 2016-02-07 ENCOUNTER — Other Ambulatory Visit: Payer: Self-pay | Admitting: Cardiology

## 2016-02-07 NOTE — Telephone Encounter (Signed)
Rx request sent to pharmacy.  

## 2016-03-02 ENCOUNTER — Inpatient Hospital Stay (HOSPITAL_COMMUNITY): Admission: RE | Admit: 2016-03-02 | Payer: Commercial Managed Care - HMO | Source: Ambulatory Visit

## 2016-03-15 ENCOUNTER — Telehealth: Payer: Self-pay | Admitting: Cardiology

## 2016-03-15 ENCOUNTER — Other Ambulatory Visit: Payer: Self-pay | Admitting: Cardiology

## 2016-03-15 NOTE — Telephone Encounter (Signed)
New message   Pt is returning call to [facility]  Please call back

## 2016-04-27 ENCOUNTER — Telehealth: Payer: Self-pay | Admitting: Cardiology

## 2016-04-27 NOTE — Telephone Encounter (Signed)
Attempt to return call, unable to reach, continues to ring without going to VM.

## 2016-04-27 NOTE — Telephone Encounter (Signed)
New Message  Pt voiced he was wanting to speak to nurse and wondering if nurse can give him a call.  Pt voiced no SOB or CP.  Please f/u

## 2016-04-28 ENCOUNTER — Encounter (HOSPITAL_COMMUNITY): Payer: Self-pay | Admitting: Cardiology

## 2016-04-28 ENCOUNTER — Telehealth (HOSPITAL_COMMUNITY): Payer: Self-pay | Admitting: Cardiology

## 2016-04-28 MED ORDER — DABIGATRAN ETEXILATE MESYLATE 150 MG PO CAPS: 150.0000 mg | ORAL_CAPSULE | Freq: Two times a day (BID) | ORAL | 0 refills | Status: DC

## 2016-04-28 NOTE — Telephone Encounter (Signed)
Returning your call. °

## 2016-04-28 NOTE — Telephone Encounter (Signed)
SPOKE TO PATIENT  INFORMED PATIENT PATIENT ASSISTANCE HAS NOT BEEN COMPLETED - DUE TO THE DOCTOR NOT IN OFFICE UNTIL NEXT WEEK.   PATIENT STATES HE ONLY HAS 2 WEEKS LEFT OF MEDICATIONS  OFFERED SAVING CARD - ONE MONTH FREE - -INFORMATION GIVEN TO PATIENT TO CONTACT SAVING CARD  1-800 # TO ACTIVATE CARD. WILL CONTACT  PHARMACY LATER TO SEE IF IT IS AVAILABLE PATIENT VERBALIZED UNDERSTANDING.

## 2016-04-28 NOTE — Telephone Encounter (Signed)
Spoke to patient -  Patient states he activated card. If needed will come an pick up card before [facility] obtain id # for pradaxa  patient verbalized understanding. Will contact when patient assistance is mailed

## 2016-04-28 NOTE — Telephone Encounter (Signed)
Pt was calling back to speak with the nurse about his patient assistance for his Pradaxa. Please f/u with pt.

## 2016-04-28 NOTE — Telephone Encounter (Signed)
FOR OUR INFORMATION  THANKS

## 2016-04-28 NOTE — Telephone Encounter (Signed)
Pt cancelled his appt on 11/16 due to a scheduling conflict.  04/28/16 I called the patient and spoke with his and he voiced that he has been tied up with his wife because she has been sick and he voiced that he would call back later in February to get rescheduled. I will go ahead and reschedule his in early March and send him a letter notifying him of this appt and he needs to reschedule he can cal back to reschedule at that time.

## 2016-05-02 MED ORDER — DABIGATRAN ETEXILATE MESYLATE 150 MG PO CAPS: 150.0000 mg | ORAL_CAPSULE | Freq: Two times a day (BID) | ORAL | 3 refills | Status: DC

## 2016-05-05 ENCOUNTER — Other Ambulatory Visit: Payer: Self-pay | Admitting: *Deleted

## 2016-05-05 DIAGNOSIS — I2510 Atherosclerotic heart disease of native coronary artery without angina pectoris: Secondary | ICD-10-CM

## 2016-05-05 DIAGNOSIS — E785 Hyperlipidemia, unspecified: Secondary | ICD-10-CM

## 2016-05-05 DIAGNOSIS — I350 Nonrheumatic aortic (valve) stenosis: Secondary | ICD-10-CM

## 2016-05-05 DIAGNOSIS — I4821 Permanent atrial fibrillation: Secondary | ICD-10-CM

## 2016-05-05 DIAGNOSIS — I10 Essential (primary) hypertension: Secondary | ICD-10-CM

## 2016-05-05 NOTE — Progress Notes (Unsigned)
NEW ORDER  , THE PREVIOUS EXPIRED

## 2016-05-10 NOTE — Telephone Encounter (Signed)
patient aware

## 2016-05-10 NOTE — Telephone Encounter (Signed)
Patient assistance information for Pradaxa faxed to company on 05/09/16

## 2016-05-31 ENCOUNTER — Telehealth: Payer: Self-pay | Admitting: Cardiology

## 2016-05-31 NOTE — Telephone Encounter (Signed)
Spoke to patient Late entry   patient brought form to be faxed to patient drug  assistance program for pradaxa.  Informed patient information was faxed and confirmed received.

## 2016-06-07 ENCOUNTER — Encounter (HOSPITAL_COMMUNITY): Payer: Self-pay

## 2016-06-07 ENCOUNTER — Inpatient Hospital Stay (HOSPITAL_COMMUNITY)
Admission: EM | Admit: 2016-06-07 | Discharge: 2016-06-27 | DRG: 266 | Disposition: A | Discharge status: Skilled Nursing Facility | Payer: Medicare HMO | Attending: Cardiovascular Disease | Admitting: Cardiovascular Disease

## 2016-06-07 ENCOUNTER — Emergency Department (HOSPITAL_COMMUNITY): Payer: Medicare HMO

## 2016-06-07 DIAGNOSIS — Z23 Encounter for immunization: Secondary | ICD-10-CM | POA: Diagnosis not present

## 2016-06-07 DIAGNOSIS — I10 Essential (primary) hypertension: Secondary | ICD-10-CM | POA: Diagnosis present

## 2016-06-07 DIAGNOSIS — G9340 Encephalopathy, unspecified: Secondary | ICD-10-CM | POA: Diagnosis present

## 2016-06-07 DIAGNOSIS — I2729 Other secondary pulmonary hypertension: Secondary | ICD-10-CM | POA: Diagnosis not present

## 2016-06-07 DIAGNOSIS — Z01818 Encounter for other preprocedural examination: Secondary | ICD-10-CM | POA: Diagnosis not present

## 2016-06-07 DIAGNOSIS — K5900 Constipation, unspecified: Secondary | ICD-10-CM | POA: Diagnosis not present

## 2016-06-07 DIAGNOSIS — K029 Dental caries, unspecified: Secondary | ICD-10-CM | POA: Diagnosis present

## 2016-06-07 DIAGNOSIS — I110 Hypertensive heart disease with heart failure: Secondary | ICD-10-CM | POA: Diagnosis present

## 2016-06-07 DIAGNOSIS — K083 Retained dental root: Secondary | ICD-10-CM | POA: Diagnosis not present

## 2016-06-07 DIAGNOSIS — Z006 Encounter for examination for normal comparison and control in clinical research program: Secondary | ICD-10-CM | POA: Diagnosis not present

## 2016-06-07 DIAGNOSIS — Z951 Presence of aortocoronary bypass graft: Secondary | ICD-10-CM | POA: Diagnosis not present

## 2016-06-07 DIAGNOSIS — M6281 Muscle weakness (generalized): Secondary | ICD-10-CM | POA: Diagnosis not present

## 2016-06-07 DIAGNOSIS — I2510 Atherosclerotic heart disease of native coronary artery without angina pectoris: Secondary | ICD-10-CM | POA: Diagnosis present

## 2016-06-07 DIAGNOSIS — J189 Pneumonia, unspecified organism: Secondary | ICD-10-CM | POA: Diagnosis not present

## 2016-06-07 DIAGNOSIS — I509 Heart failure, unspecified: Secondary | ICD-10-CM

## 2016-06-07 DIAGNOSIS — Z7982 Long term (current) use of aspirin: Secondary | ICD-10-CM | POA: Diagnosis not present

## 2016-06-07 DIAGNOSIS — R0600 Dyspnea, unspecified: Secondary | ICD-10-CM

## 2016-06-07 DIAGNOSIS — I5032 Chronic diastolic (congestive) heart failure: Secondary | ICD-10-CM | POA: Diagnosis not present

## 2016-06-07 DIAGNOSIS — I350 Nonrheumatic aortic (valve) stenosis: Secondary | ICD-10-CM | POA: Diagnosis not present

## 2016-06-07 DIAGNOSIS — Z954 Presence of other heart-valve replacement: Secondary | ICD-10-CM | POA: Diagnosis not present

## 2016-06-07 DIAGNOSIS — K0530 Chronic periodontitis, unspecified: Secondary | ICD-10-CM | POA: Diagnosis present

## 2016-06-07 DIAGNOSIS — I5040 Unspecified combined systolic (congestive) and diastolic (congestive) heart failure: Secondary | ICD-10-CM | POA: Diagnosis not present

## 2016-06-07 DIAGNOSIS — R278 Other lack of coordination: Secondary | ICD-10-CM | POA: Diagnosis not present

## 2016-06-07 DIAGNOSIS — I5043 Acute on chronic combined systolic (congestive) and diastolic (congestive) heart failure: Secondary | ICD-10-CM | POA: Diagnosis not present

## 2016-06-07 DIAGNOSIS — Z79899 Other long term (current) drug therapy: Secondary | ICD-10-CM | POA: Diagnosis not present

## 2016-06-07 DIAGNOSIS — I2583 Coronary atherosclerosis due to lipid rich plaque: Secondary | ICD-10-CM | POA: Diagnosis not present

## 2016-06-07 DIAGNOSIS — Z6824 Body mass index (BMI) 24.0-24.9, adult: Secondary | ICD-10-CM | POA: Diagnosis not present

## 2016-06-07 DIAGNOSIS — E43 Unspecified severe protein-calorie malnutrition: Secondary | ICD-10-CM | POA: Diagnosis not present

## 2016-06-07 DIAGNOSIS — R079 Chest pain, unspecified: Secondary | ICD-10-CM | POA: Diagnosis present

## 2016-06-07 DIAGNOSIS — J90 Pleural effusion, not elsewhere classified: Secondary | ICD-10-CM | POA: Diagnosis not present

## 2016-06-07 DIAGNOSIS — E861 Hypovolemia: Secondary | ICD-10-CM | POA: Diagnosis present

## 2016-06-07 DIAGNOSIS — R0601 Orthopnea: Secondary | ICD-10-CM | POA: Diagnosis not present

## 2016-06-07 DIAGNOSIS — I442 Atrioventricular block, complete: Secondary | ICD-10-CM | POA: Diagnosis not present

## 2016-06-07 DIAGNOSIS — R0602 Shortness of breath: Secondary | ICD-10-CM | POA: Diagnosis not present

## 2016-06-07 DIAGNOSIS — I4891 Unspecified atrial fibrillation: Secondary | ICD-10-CM | POA: Diagnosis not present

## 2016-06-07 DIAGNOSIS — E785 Hyperlipidemia, unspecified: Secondary | ICD-10-CM | POA: Diagnosis present

## 2016-06-07 DIAGNOSIS — K036 Deposits [accretions] on teeth: Secondary | ICD-10-CM | POA: Diagnosis not present

## 2016-06-07 DIAGNOSIS — I480 Paroxysmal atrial fibrillation: Secondary | ICD-10-CM | POA: Diagnosis not present

## 2016-06-07 DIAGNOSIS — Z01810 Encounter for preprocedural cardiovascular examination: Secondary | ICD-10-CM | POA: Diagnosis not present

## 2016-06-07 DIAGNOSIS — Z87891 Personal history of nicotine dependence: Secondary | ICD-10-CM

## 2016-06-07 DIAGNOSIS — Z592 Discord with neighbors, lodgers and landlord: Secondary | ICD-10-CM | POA: Diagnosis not present

## 2016-06-07 DIAGNOSIS — E871 Hypo-osmolality and hyponatremia: Secondary | ICD-10-CM | POA: Diagnosis present

## 2016-06-07 DIAGNOSIS — I499 Cardiac arrhythmia, unspecified: Secondary | ICD-10-CM | POA: Diagnosis not present

## 2016-06-07 DIAGNOSIS — I482 Chronic atrial fibrillation: Secondary | ICD-10-CM | POA: Diagnosis not present

## 2016-06-07 DIAGNOSIS — D62 Acute posthemorrhagic anemia: Secondary | ICD-10-CM | POA: Diagnosis not present

## 2016-06-07 DIAGNOSIS — I481 Persistent atrial fibrillation: Secondary | ICD-10-CM | POA: Diagnosis present

## 2016-06-07 DIAGNOSIS — R2689 Other abnormalities of gait and mobility: Secondary | ICD-10-CM | POA: Diagnosis not present

## 2016-06-07 DIAGNOSIS — E860 Dehydration: Secondary | ICD-10-CM | POA: Diagnosis present

## 2016-06-07 DIAGNOSIS — I4821 Permanent atrial fibrillation: Secondary | ICD-10-CM | POA: Diagnosis present

## 2016-06-07 DIAGNOSIS — R0902 Hypoxemia: Secondary | ICD-10-CM | POA: Diagnosis not present

## 2016-06-07 LAB — BASIC METABOLIC PANEL
ANION GAP: 7 (ref 5–15)
Anion gap: 10 (ref 5–15)
Anion gap: 9 (ref 5–15)
Anion gap: 7 (ref 5–15)
BUN: 10 mg/dL (ref 6–20)
BUN: 14 mg/dL (ref 6–20)
BUN: 8 mg/dL (ref 6–20)
BUN: 9 mg/dL (ref 6–20)
CALCIUM: 8.8 mg/dL — AB (ref 8.9–10.3)
CALCIUM: 8.7 mg/dL — AB (ref 8.9–10.3)
CALCIUM: 8.8 mg/dL — AB (ref 8.9–10.3)
CHLORIDE: 89 mmol/L — AB (ref 101–111)
CO2: 20 mmol/L — AB (ref 22–32)
CO2: 21 mmol/L — AB (ref 22–32)
CO2: 23 mmol/L (ref 22–32)
CO2: 25 mmol/L (ref 22–32)
CREATININE: 0.64 mg/dL (ref 0.61–1.24)
CREATININE: 0.7 mg/dL (ref 0.61–1.24)
CREATININE: 0.73 mg/dL (ref 0.61–1.24)
Calcium: 9 mg/dL (ref 8.9–10.3)
Chloride: 88 mmol/L — ABNORMAL LOW (ref 101–111)
Chloride: 93 mmol/L — ABNORMAL LOW (ref 101–111)
Chloride: 92 mmol/L — ABNORMAL LOW (ref 101–111)
Creatinine, Ser: 0.71 mg/dL (ref 0.61–1.24)
GFR calc Af Amer: >60 mL/min (ref >60)
GFR calc Af Amer: >60 mL/min (ref >60)
GFR calc Af Amer: >60 mL/min (ref >60)
GFR calc non Af Amer: >60 mL/min (ref >60)
GLUCOSE: 134 mg/dL — AB (ref 65–99)
GLUCOSE: 146 mg/dL — AB (ref 65–99)
GLUCOSE: 169 mg/dL — AB (ref 65–99)
Glucose, Bld: 113 mg/dL — ABNORMAL HIGH (ref 65–99)
POTASSIUM: 4.4 mmol/L (ref 3.5–5.1)
POTASSIUM: 4.6 mmol/L (ref 3.5–5.1)
Potassium: 4.3 mmol/L (ref 3.5–5.1)
Potassium: 4.1 mmol/L (ref 3.5–5.1)
SODIUM: 119 mmol/L — AB (ref 135–145)
SODIUM: 122 mmol/L — AB (ref 135–145)
Sodium: 120 mmol/L — ABNORMAL LOW (ref 135–145)
Sodium: 123 mmol/L — ABNORMAL LOW (ref 135–145)

## 2016-06-07 LAB — EXPECTORATED SPUTUM ASSESSMENT W GRAM STAIN, RFLX TO RESP C

## 2016-06-07 LAB — CBC
HEMATOCRIT: 32.5 % — AB (ref 39.0–52.0)
Hemoglobin: 11.8 g/dL — ABNORMAL LOW (ref 13.0–17.0)
MCH: 33.3 pg (ref 26.0–34.0)
MCHC: 36.3 g/dL — ABNORMAL HIGH (ref 30.0–36.0)
MCV: 91.8 fL (ref 78.0–100.0)
Platelets: 187 10*3/uL (ref 150–400)
RBC: 3.54 MIL/uL — ABNORMAL LOW (ref 4.22–5.81)
RDW: 12 % (ref 11.5–15.5)
WBC: 11.6 10*3/uL — AB (ref 4.0–10.5)

## 2016-06-07 LAB — I-STAT TROPONIN, ED: Troponin i, poc: 0.01 ng/mL (ref 0.00–0.08)

## 2016-06-07 LAB — OSMOLALITY: Osmolality: 258 mOsm/kg — ABNORMAL LOW (ref 275–295)

## 2016-06-07 LAB — SODIUM, URINE, RANDOM: Sodium, Ur: <10 mmol/L

## 2016-06-07 LAB — OSMOLALITY, URINE: Osmolality, Ur: 220 mOsm/kg — ABNORMAL LOW (ref 300–900)

## 2016-06-07 LAB — STREP PNEUMONIAE URINARY ANTIGEN: STREP PNEUMO URINARY ANTIGEN: NEGATIVE

## 2016-06-07 LAB — INFLUENZA PANEL BY PCR (TYPE A & B)
INFLAPCR: NEGATIVE
INFLBPCR: NEGATIVE

## 2016-06-07 LAB — BRAIN NATRIURETIC PEPTIDE: B NATRIURETIC PEPTIDE 5: 277.1 pg/mL — AB (ref 0.0–100.0)

## 2016-06-07 LAB — EXPECTORATED SPUTUM ASSESSMENT W REFEX TO RESP CULTURE

## 2016-06-07 LAB — MRSA PCR SCREENING: MRSA by PCR: NEGATIVE

## 2016-06-07 LAB — TROPONIN I
Troponin I: <0.03 ng/mL (ref <0.03)
Troponin I: <0.03 ng/mL (ref <0.03)

## 2016-06-07 MED ORDER — DEXTROSE 5 % IV SOLN
500.0000 mg | INTRAVENOUS | Status: AC
  Administered 2016-06-08 – 2016-06-14 (×7): 500 mg via INTRAVENOUS
  Filled 2016-06-07 (×7): qty 500

## 2016-06-07 MED ORDER — SODIUM CHLORIDE 0.9% FLUSH
3.0000 mL | Freq: Two times a day (BID) | INTRAVENOUS | Status: DC
  Administered 2016-06-08 – 2016-06-21 (×21): 3 mL via INTRAVENOUS

## 2016-06-07 MED ORDER — ROSUVASTATIN CALCIUM 10 MG PO TABS
10.0000 mg | ORAL_TABLET | Freq: Every day | ORAL | Status: DC
  Administered 2016-06-07 – 2016-06-27 (×19): 10 mg via ORAL
  Filled 2016-06-07 (×19): qty 1

## 2016-06-07 MED ORDER — DEXTROSE 5 % IV SOLN
1.0000 g | INTRAVENOUS | Status: AC
  Administered 2016-06-08 – 2016-06-14 (×7): 1 g via INTRAVENOUS
  Filled 2016-06-07 (×7): qty 10

## 2016-06-07 MED ORDER — ONDANSETRON HCL 4 MG PO TABS: 4.0000 mg | ORAL_TABLET | Freq: Four times a day (QID) | ORAL | Status: DC | PRN

## 2016-06-07 MED ORDER — LISINOPRIL 10 MG PO TABS
10.0000 mg | ORAL_TABLET | Freq: Every day | ORAL | Status: DC
  Administered 2016-06-07 – 2016-06-08 (×2): 10 mg via ORAL
  Filled 2016-06-07 (×2): qty 1

## 2016-06-07 MED ORDER — ENOXAPARIN SODIUM 40 MG/0.4ML ~~LOC~~ SOLN: 40.0000 mg | SUBCUTANEOUS | Status: DC

## 2016-06-07 MED ORDER — ACETAMINOPHEN 325 MG PO TABS
650.0000 mg | ORAL_TABLET | Freq: Four times a day (QID) | ORAL | Status: DC | PRN
  Administered 2016-06-07 – 2016-06-19 (×10): 650 mg via ORAL
  Administered 2016-06-20: 325 mg via ORAL
  Administered 2016-06-21 – 2016-06-23 (×4): 650 mg via ORAL
  Administered 2016-06-24: 325 mg via ORAL
  Administered 2016-06-24 – 2016-06-27 (×7): 650 mg via ORAL
  Filled 2016-06-07 (×22): qty 2

## 2016-06-07 MED ORDER — ASPIRIN EC 81 MG PO TBEC
81.0000 mg | DELAYED_RELEASE_TABLET | Freq: Every day | ORAL | Status: DC
  Administered 2016-06-07 – 2016-06-27 (×19): 81 mg via ORAL
  Filled 2016-06-07 (×19): qty 1

## 2016-06-07 MED ORDER — HYDROCODONE-ACETAMINOPHEN 5-325 MG PO TABS
1.0000 | ORAL_TABLET | ORAL | Status: DC | PRN
  Administered 2016-06-08 (×2): 2 via ORAL
  Administered 2016-06-09 – 2016-06-10 (×3): 1 via ORAL
  Administered 2016-06-11 – 2016-06-23 (×9): 2 via ORAL
  Administered 2016-06-23: 1 via ORAL
  Filled 2016-06-07: qty 1
  Filled 2016-06-07 (×5): qty 2
  Filled 2016-06-07: qty 1
  Filled 2016-06-07 (×6): qty 2
  Filled 2016-06-07 (×2): qty 1

## 2016-06-07 MED ORDER — ATENOLOL 25 MG PO TABS
50.0000 mg | ORAL_TABLET | Freq: Every day | ORAL | Status: DC
  Administered 2016-06-07 – 2016-06-15 (×9): 50 mg via ORAL
  Filled 2016-06-07: qty 2
  Filled 2016-06-07: qty 1
  Filled 2016-06-07 (×8): qty 2

## 2016-06-07 MED ORDER — DABIGATRAN ETEXILATE MESYLATE 150 MG PO CAPS
150.0000 mg | ORAL_CAPSULE | Freq: Two times a day (BID) | ORAL | Status: AC
  Administered 2016-06-07 – 2016-06-09 (×6): 150 mg via ORAL
  Filled 2016-06-07 (×6): qty 1

## 2016-06-07 MED ORDER — PNEUMOCOCCAL VAC POLYVALENT 25 MCG/0.5ML IJ INJ
0.5000 mL | INJECTION | INTRAMUSCULAR | Status: AC
  Administered 2016-06-09: 0.5 mL via INTRAMUSCULAR
  Filled 2016-06-07 (×2): qty 0.5

## 2016-06-07 MED ORDER — INFLUENZA VAC SPLIT QUAD 0.5 ML IM SUSY
0.5000 mL | PREFILLED_SYRINGE | INTRAMUSCULAR | Status: AC
  Administered 2016-06-09: 0.5 mL via INTRAMUSCULAR
  Filled 2016-06-07: qty 0.5

## 2016-06-07 MED ORDER — DILTIAZEM HCL ER COATED BEADS 240 MG PO CP24
240.0000 mg | ORAL_CAPSULE | Freq: Every day | ORAL | Status: DC
  Administered 2016-06-07 – 2016-06-27 (×19): 240 mg via ORAL
  Filled 2016-06-07 (×19): qty 1

## 2016-06-07 MED ORDER — SODIUM CHLORIDE 0.9 % IV BOLUS (SEPSIS)
500.0000 mL | Freq: Once | INTRAVENOUS | Status: AC
  Administered 2016-06-07: 500 mL via INTRAVENOUS

## 2016-06-07 MED ORDER — DEXTROSE 5 % IV SOLN
500.0000 mg | Freq: Once | INTRAVENOUS | Status: AC
  Administered 2016-06-07: 500 mg via INTRAVENOUS
  Filled 2016-06-07: qty 500

## 2016-06-07 MED ORDER — DEXTROSE 5 % IV SOLN
1.0000 g | Freq: Once | INTRAVENOUS | Status: AC
  Administered 2016-06-07: 1 g via INTRAVENOUS
  Filled 2016-06-07: qty 10

## 2016-06-07 MED ORDER — SODIUM CHLORIDE 0.9 % IV SOLN: INTRAVENOUS | Status: DC

## 2016-06-07 MED ORDER — KETOROLAC TROMETHAMINE 15 MG/ML IJ SOLN
15.0000 mg | Freq: Once | INTRAMUSCULAR | Status: AC
  Administered 2016-06-08: 15 mg via INTRAVENOUS
  Filled 2016-06-07: qty 1

## 2016-06-07 MED ORDER — SODIUM CHLORIDE 0.9 % IV SOLN
INTRAVENOUS | Status: AC
  Administered 2016-06-07: 50 mL/h via INTRAVENOUS

## 2016-06-07 MED ORDER — ONDANSETRON HCL 4 MG/2ML IJ SOLN
4.0000 mg | Freq: Four times a day (QID) | INTRAMUSCULAR | Status: DC | PRN
  Administered 2016-06-10: 4 mg via INTRAVENOUS
  Filled 2016-06-07: qty 2

## 2016-06-07 MED ORDER — ONDANSETRON HCL 4 MG/2ML IJ SOLN
4.0000 mg | Freq: Once | INTRAMUSCULAR | Status: AC
  Administered 2016-06-07: 4 mg via INTRAVENOUS
  Filled 2016-06-07: qty 2

## 2016-06-07 NOTE — ED Triage Notes (Signed)
Pt comes from home via [facility] for CP/SOB, pain has went on and off for the past week, radiation to back, 2/10 pain, SOB with crackles in lower lobes. Afib on EKG, some nausea, PTA received 324 ASA, 4 mg zofran, one nitro

## 2016-06-07 NOTE — Consult Note (Signed)
Cardiology Consult    Patient ID: Richard Simon MRN: [id number], DOB/AGE: 17-Feb-1930   Admit date: 06/07/2016 Date of Consult: 06/07/2016  Primary Physician: Merrilee Seashore, MD Primary Cardiologist: Ellyn Hack Requesting Provider: Cruzita Lederer Reason for Consultation: Chest pain/Dyspnea  Patient Profile    83 yo male with PMH of CAD s/p CABG. Mod-Severe AS, per AFib, HTN, and HL who presented with chest pain, and dyspnea.   Past Medical History   Past Medical History:  Diagnosis Date  . Atrial fibrillation, permanent ([place])    Rate controlled. Anticoagulation with Pradaxa.  Marland Kitchen CAD (coronary artery disease) 2004   S/P 4-vessel CABG in 2004  . Dyslipidemia, goal LDL below 70   . Hypertension     very well controlled  . Moderate to severe aortic stenosis 01/2013; 06/2014   Echo Eval: a) 01/2013: Mod calcified AoV w/ reduced mobility of R cusp. Mod-Severe stenosis. Mn-Pk gradient 25 mmHg--39 mmHg. AVA ~ 0.79-0.83 cm. Moderate MR. Normal LV size and function EF 55-60%. Elevated LAP. Mild pulmonary attention. Mild moderately dilated left atrium.;; b) 06/2014: EF 60-65%. Mod-Severe AS, AVA~0.69, Mn-Pk gradient 32 mmHg -- 55 mmHg, severe LA dilation, moderate-severe RA d  . S/P CABG x 4 2004   Post CABG    Past Surgical History:  Procedure Laterality Date  . CARDIAC CATHETERIZATION  12/11/2002   Recommendation-CABG; 80% mid LAD, 70% distal LAD. Mid circumflex 95%, OM1 95%. AV groove circumflex 95%. Proximal RCA 100%  . CORONARY ARTERY BYPASS GRAFT  12/15/2002   LIMA-LAD, SVG-diagonal, SVG-OM1-OM2 sequential  Carlton Adam MYOVIEW  December 2012   Fixed basal inferior diaphragmatic attenuation versusinfarct, no ischemia  . TRANSTHORACIC ECHOCARDIOGRAM  September 2013   Normal EF. > 55%; moderate aortic valve stenosis with mild AI. Gradient: Peak 40 mmHg, mean 24 military. Moderate LA dilation. Moderate TR with RV pressure 30-40 mmHg. Mild MR.  . TRANSTHORACIC ECHOCARDIOGRAM  October 2014   Moderate-severe AS with mild AI. Peak and mean gradients no significant change: 39 mmHg and 25 mmHg;; mild LV dilation with EF 55-60%. Elevated LAP. Moderate MR. Mild to moderate LA dilation. Mildly elevated RV pressure.  . TRANSTHORACIC ECHOCARDIOGRAM  March 2016   EF 60-65%. Mod-Severe AS, AVA~0.69, Mn-Pk gradient 32 mmHg -- 55 mmHg, severe LA dilation, moderate-severe RA dilation: Progression of disease     Allergies  No Known Allergies  History of Present Illness    [name] is a 83 yo male with PMH of CAD s/p CABG. Mod-Severe AS, per AFib, HTN, and HL. He had CABG in 2004, and negative myoview in 2012. Currently followed by Dr. Ellyn Hack and was last seen in the office on 2/17. At this visit he was doing well without any active anginal symptoms. His AF was continued on dilt and atenolol, with Pradaxa for [place]. There was concern that his AS had worsened, and plan was to obtain a screening myoview prior to his routine follow up as he was asymptomatic. Reports he planned to follow through with this but his wife fell recently and broke her hip. He has been caring for her.  Has been in his usual state of health until about a week ago. Reports progressive dyspnea along with orthopnea and generalized fatigue. Last night developed some chest tightness with radiation to the upper back but felt more so like he could not catch his breath. Denied any palpitations, swelling, dizziness, nausea or vomiting.  In the ED CXR was concerning for infectious process in the upper lobes, no edema. Labs showed  Na+ 119, BNP 277, Hgb 11.8, Trop negx3. He was started on antibiotics for CAP. Also given gentle IV hydration for hyponatremia. Cardiology was consulted in regards to his reports of chest pain and AS.   Inpatient Medications    . aspirin EC  81 mg Oral Daily  . atenolol  50 mg Oral Daily  . [START ON 06/08/2016] azithromycin  500 mg Intravenous Q24H  . [START ON 06/08/2016] cefTRIAXone (ROCEPHIN)  IV  1 g  Intravenous Q24H  . dabigatran  150 mg Oral BID  . diltiazem  240 mg Oral Daily  . lisinopril  10 mg Oral QHS  . rosuvastatin  10 mg Oral Daily  . sodium chloride flush  3 mL Intravenous Q12H    Family History    Family History  Problem Relation Age of Onset  . Hip fracture Mother     Social History    Social History   Social History  . Marital status: Married    Spouse name: N/A  . Number of children: N/A  . Years of education: N/A   Occupational History  . Not on file.   Social History Main Topics  . Smoking status: Former Smoker    Quit date: 02/12/1975  . Smokeless tobacco: Never Used  . Alcohol use 8.4 oz/week    14 Glasses of wine per week  . Drug use: Unknown  . Sexual activity: Not on file   Other Topics Concern  . Not on file   Social History Narrative   Married. Father of 86, grandfather 21, great-grandfather 3.   Very active, but without routine exercise. Does yard work and intermittent walking, but nothing routine.   Does not smoke. Occasional alcohol.     Review of Systems    General:  No chills, fever, night sweats or weight changes.  Cardiovascular:  ++ chest pain, dyspnea on exertion, edema, ++ orthopnea, palpitations, paroxysmal nocturnal dyspnea. Dermatological: No rash, lesions/masses Respiratory: ++ cough,  ++dyspnea Urologic: No hematuria, dysuria Abdominal:   No nausea, vomiting, diarrhea, bright red blood per rectum, melena, or hematemesis Neurologic:  No visual changes, ++ wkns, changes in mental status. All other systems reviewed and are otherwise negative except as noted above.  Physical Exam    Blood pressure 119/76, pulse 66, temperature 98.5 F (36.9 C), temperature source Oral, resp. rate (!) 37, height 5\' 9"  (1.753 m), weight 189 lb (85.7 kg), SpO2 96 %.  General: Pleasant older WM, NAD, wearing [place].  Psych: Normal affect. Neuro: Alert and oriented X 3. Moves all extremities spontaneously. HEENT: Normal  Neck: Supple without  bruits or JVD. Lungs:  Resp regular and unlabored, CTA. Heart: Irreg Irreg no s3, s4, 3/4 systolic murmur. Abdomen: Soft, non-tender, non-distended, BS + x 4.  Extremities: No clubbing, cyanosis or edema. DP/PT/Radials 2+ and equal bilaterally.  Labs    Troponin [facility] of Care Test)  Recent Labs  06/07/16 0049  TROPIPOC 0.01    Recent Labs  06/07/16 0723 06/07/16 1357  TROPONINI <0.03 <0.03   Lab Results  Component Value Date   WBC 11.6 (H) 06/07/2016   HGB 11.8 (L) 06/07/2016   HCT 32.5 (L) 06/07/2016   MCV 91.8 06/07/2016   PLT 187 06/07/2016    Recent Labs Lab 06/07/16 1357  NA 123*  K 4.1  CL 93*  CO2 21*  BUN 8  CREATININE 0.64  CALCIUM 8.7*  GLUCOSE 169*   Lab Results  Component Value Date   CHOL 150 12/28/2010  HDL 56 12/28/2010   LDLCALC 85 12/28/2010   TRIG 45 12/28/2010   No results found for: [facility]   Radiology Studies    Dg Chest 2 View  Result Date: 06/07/2016 CLINICAL DATA:  Chest pain with shortness of breath EXAM: CHEST  2 VIEW COMPARISON:  12/27/2010 FINDINGS: Post sternotomy changes. Mild hyperinflation. Diffuse coarsening of the lung interstitium, suspect chronic change. Hazy opacity within the bilateral upper lobes suspicious for superimposed infection or inflammatory process. Tiny bilateral effusions. Mild cardiomegaly. No pneumothorax. Degenerative changes of the spine. IMPRESSION: 1. Diffuse coarsening of the lung interstitium, suspect chronic interstitial changes. Hazy opacities are present within the bilateral upper lobes and are suspicious for superimposed infectious or inflammatory process. 2. Mild cardiomegaly Electronically Signed   By: Donavan Foil M.D.   On: 06/07/2016 01:16    ECG & Cardiac Imaging    EKG: AF rate 94  Echo: 4/17  Study Conclusions  - Left ventricle: The cavity size was normal. Wall thickness was   increased in a pattern of mild LVH. Systolic function was normal.   The estimated ejection fraction  was in the range of 55% to 60%.   Wall motion was normal; there were no regional wall motion   abnormalities. - Aortic valve: Valve mobility was restricted. There was severe   stenosis. There was moderate regurgitation. Peak velocity (S):   451 cm/s. Mean gradient (S): 43 mm Hg. - Mitral valve: Moderately calcified annulus. There was mild   regurgitation. - Left atrium: The atrium was moderately dilated. - Pulmonary arteries: Systolic pressure was mildly increased. PA   peak pressure: 32 mm Hg (S).  Impressions:  - Mean gradient 94mmHg from prior echocardiogram. Aortic stenosis   has increased.  Assessment & Plan    83 yo male with PMH of CAD s/p CABG. Mod-Severe AS, per AFib, HTN, and HL who presented with chest pain, and dyspnea.  1. Atypical Chest pain: States he had some tightness in his chest last evening while at home, but described this more as he could not catch his breath. Also had some upper back pain. Symptoms not similar to those he experienced with previous cardiac events. EKG nonacute, showing AF rate controlled. Denies any anginal episodes with usual activity. He has ruled out with enzymes.   2. Mod-Severe AS: Last echo showed a mean gradient of 67mmHg, EF 55-60%. Reportedly asymptomatic at home prior to this admission. He is currently being followed by Dr. Ellyn Hack regarding timing of possible AVR. -- repeat echo pending.   3. Persistent AF: Rate controlled on BB and Dilt. On Pradaxa for [place].  -- This patients CHA2DS2-VASc Score and unadjusted Ischemic Stroke Rate (% per year) is equal to 2.2 % stroke rate/year from a score of 3  4. HTN: Controlled  5. Dyspnea: Being treated for possible CAP. BNP mildly elevated but does not appear volume overloaded, and no edema on CXR.   Barnet Pall, NP-C Pager [phone] 06/07/2016, 2:53 PM   Attending Note:   The patient was seen and examined.  Agree with assessment and plan as noted above.  Changes made to  the above note as needed.  Patient seen and independently examined with Reino Bellis, NP.   We discussed all aspects of the encounter. I agree with the assessment and plan as stated above.  1. Chest tightness / dyspnea: Related to his pneumonia , troponins are negative   would not pursue any further eval at this time He will need a  cath piror to his AV surgery anyway ( which will be done in the next several months   2. Aortic stenosis:   Was severe by last echo Repeat echo is ordered.  No further work up here in the hospital  He needs to get over this pneumonia and then be seen By Dr Ellyn Hack to arrange for cath and AV surgery as OP    I have spent a total of 40 minutes with patient reviewing hospital  notes , telemetry, EKGs, labs and examining patient as well as establishing an assessment and plan that was discussed with the patient. > 50% of time was spent in direct patient care.   Thayer Headings, Brooke Bonito., MD, [facility] 06/07/2016, 4:05 PM [address]  [place] Pager [phone]

## 2016-06-07 NOTE — ED Notes (Signed)
119 sodium level called by lab

## 2016-06-07 NOTE — H&P (Signed)
History and Physical    Richard Simon [id number] DOB: [birth year] DOA: 06/07/2016  PCP: Merrilee Seashore, MD   Patient coming from: Home.  Chief Complaint: Chest pressure and of breath.  HPI: Richard Simon is a 83 y.o. male with medical history significant of atrial fibrillation on Pradaxa, CAD, CABG, hyperlipidemia, hypertension, moderate to severe aortic stenosis is coming to the emergency department with complaints of on/off chest pressure, radiates to his back, improved by rest for the past week associated with dyspnea, orthopnea and fatigue. He also complains of nausea, no emesis, positive diarrhea, cough of productive yellowish sputum, decreased sleep and appetite. He denies rhinorrhea, sore throat, pleuritic chest pain, abdominal pain, melena, hematochezia, dysuria or frequency.  ED Course: The patient received supplemental oxygen, ceftriaxone and azithromycin. EKG was atrial fibrillation with ventricular rate at 94 BPM. First troponin was normal, BNP 277 pg/mL. WBC 11.6, hemoglobin 11.8 g/dL and platelets 187. Sodium was 119, potassium 4.6, chloride 89, and bicarbonate 20 mmol/L. BUN 14, creatinine 0.73 and glucose 146 mg/dL.  Imaging: Chest radiographs of interstitial changes suspicious for infectious or inflammatory etiology. There were bilateral upper lobes hazy opacities. Mild cardiomegaly. Please see full radiology report for further detail.  Review of Systems: As per HPI otherwise 10 point review of systems negative.    Past Medical History:  Diagnosis Date  . Atrial fibrillation, permanent ([place])    Rate controlled. Anticoagulation with Pradaxa.  Marland Kitchen CAD (coronary artery disease) 2004   S/P 4-vessel CABG in 2004  . Dyslipidemia, goal LDL below 70   . Hypertension     very well controlled  . Moderate to severe aortic stenosis 01/2013; 06/2014   Echo Eval: a) 01/2013: Mod calcified AoV w/ reduced mobility of R cusp. Mod-Severe stenosis. Mn-Pk gradient 25 mmHg--39 mmHg. AVA ~  0.79-0.83 cm. Moderate MR. Normal LV size and function EF 55-60%. Elevated LAP. Mild pulmonary attention. Mild moderately dilated left atrium.;; b) 06/2014: EF 60-65%. Mod-Severe AS, AVA~0.69, Mn-Pk gradient 32 mmHg -- 55 mmHg, severe LA dilation, moderate-severe RA d  . S/P CABG x 4 2004   Post CABG    Past Surgical History:  Procedure Laterality Date  . CARDIAC CATHETERIZATION  12/11/2002   Recommendation-CABG; 80% mid LAD, 70% distal LAD. Mid circumflex 95%, OM1 95%. AV groove circumflex 95%. Proximal RCA 100%  . CORONARY ARTERY BYPASS GRAFT  12/15/2002   LIMA-LAD, SVG-diagonal, SVG-OM1-OM2 sequential  Carlton Adam MYOVIEW  December 2012   Fixed basal inferior diaphragmatic attenuation versusinfarct, no ischemia  . TRANSTHORACIC ECHOCARDIOGRAM  September 2013   Normal EF. > 55%; moderate aortic valve stenosis with mild AI. Gradient: Peak 40 mmHg, mean 24 military. Moderate LA dilation. Moderate TR with RV pressure 30-40 mmHg. Mild MR.  . TRANSTHORACIC ECHOCARDIOGRAM  October 2014   Moderate-severe AS with mild AI. Peak and mean gradients no significant change: 39 mmHg and 25 mmHg;; mild LV dilation with EF 55-60%. Elevated LAP. Moderate MR. Mild to moderate LA dilation. Mildly elevated RV pressure.  . TRANSTHORACIC ECHOCARDIOGRAM  March 2016   EF 60-65%. Mod-Severe AS, AVA~0.69, Mn-Pk gradient 32 mmHg -- 55 mmHg, severe LA dilation, moderate-severe RA dilation: Progression of disease     reports that he quit smoking about 41 years ago. He has never used smokeless tobacco. He reports that he drinks about 8.4 oz of alcohol per week . His drug history is not on file.  No Known Allergies  Family History  Problem Relation Age of Onset  . Hip fracture Mother  Prior to Admission medications   Medication Sig Start Date End Date Taking? Authorizing Provider  Acetaminophen 500 MG coapsule Take 1 tablet by mouth every 6 (six) hours as needed for pain.  05/18/14  Yes Historical Provider, MD    aspirin EC 81 MG tablet Take 81 mg by mouth daily.   Yes Historical Provider, MD  atenolol (TENORMIN) 50 MG tablet Take 50 mg by mouth daily. 01/08/13  Yes Historical Provider, MD  CARTIA XT 240 MG 24 hr capsule TAKE 1 CAPSULE EVERY DAY 02/07/16  Yes Leonie Man, MD  co-enzyme Q-10 30 MG capsule Take 1 capsule by mouth daily. 04/28/15  Yes Historical Provider, MD  dabigatran (PRADAXA) 150 MG CAPS capsule Take 1 capsule (150 mg total) by mouth 2 (two) times daily. 05/02/16  Yes Leonie Man, MD  lisinopril (PRINIVIL,ZESTRIL) 10 MG tablet Take 1 tablet by mouth at bedtime. 04/24/15  Yes Historical Provider, MD  Omega-3 Fatty Acids (FISH OIL) 1200 MG CAPS Take 1,200 mg by mouth 2 (two) times daily.   Yes Historical Provider, MD  rosuvastatin (CRESTOR) 10 MG tablet TAKE 1 TABLET (10 MG TOTAL) BY MOUTH DAILY. 03/16/16  Yes Leonie Man, MD    Physical Exam:  Constitutional: NAD, calm, comfortable Vitals:   06/07/16 0045 06/07/16 0130 06/07/16 0200 06/07/16 0230  BP: 124/81 113/79 113/76 117/67  Pulse: 72 109 76 73  Resp: 21 22 23 18   Temp:      TempSrc:      SpO2: 93% 94% 97% 95%  Weight:      Height:       Eyes: PERRL, lids and conjunctivae normal ENMT: Mucous membranes are moist. Posterior pharynx clear of any exudate or lesions. Neck: normal, supple, no masses, no thyromegaly Respiratory: Bibasilar rales. No wheezing or rhonchi.. Normal respiratory effort. No accessory muscle use.  Cardiovascular: Irregularly irregular, positive SEM, no rubs / gallops. Trace extremity edema. 2+ pedal pulses. No carotid bruits.  Abdomen: Bowel sounds positive. Soft, no tenderness, no masses palpated. No hepatosplenomegaly.  Musculoskeletal: no clubbing / cyanosis. No joint deformity upper and lower extremities. Good ROM, no contractures. Normal muscle tone.  Skin: no rashes, lesions, ulcers on limited skin exam. Neurologic: CN 2-12 grossly intact. Sensation intact, DTR normal. Strength 5/5 in all  4.  Psychiatric: Normal judgment and insight. Alert and oriented x 3. Normal mood.    Labs on Admission: I have personally reviewed following labs and imaging studies  CBC:  Recent Labs Lab 06/07/16 0044  WBC 11.6*  HGB 11.8*  HCT 32.5*  MCV 91.8  PLT [id number]   Basic Metabolic Panel:  Recent Labs Lab 06/07/16 0044  NA 119*  K 4.6  CL 89*  CO2 20*  GLUCOSE 146*  BUN 14  CREATININE 0.73  CALCIUM 9.0   GFR: Estimated Creatinine Clearance: 74.6 mL/min (by C-G formula based on SCr of 0.73 mg/dL). Liver Function Tests: No results for input(s): AST, ALT, ALKPHOS, BILITOT, PROT, ALBUMIN in the last 168 hours. No results for input(s): LIPASE, AMYLASE in the last 168 hours. No results for input(s): AMMONIA in the last 168 hours. Coagulation Profile: No results for input(s): INR, PROTIME in the last 168 hours. Cardiac Enzymes: No results for input(s): CKTOTAL, CKMB, CKMBINDEX, TROPONINI in the last 168 hours. BNP (last 3 results) No results for input(s): PROBNP in the last 8760 hours. HbA1C: No results for input(s): HGBA1C in the last 72 hours. CBG: No results for input(s): GLUCAP in the last 168  hours. Lipid Profile: No results for input(s): CHOL, HDL, LDLCALC, TRIG, CHOLHDL, LDLDIRECT in the last 72 hours. Thyroid Function Tests: No results for input(s): TSH, T4TOTAL, FREET4, T3FREE, THYROIDAB in the last 72 hours. Anemia Panel: No results for input(s): VITAMINB12, FOLATE, FERRITIN, TIBC, IRON, RETICCTPCT in the last 72 hours. Urine analysis:    Component Value Date/Time   COLORURINE YELLOW 12/28/2010 0050   APPEARANCEUR CLEAR 12/28/2010 0050   LABSPEC 1.008 12/28/2010 0050   PHURINE 7.0 12/28/2010 0050   GLUCOSEU NEGATIVE 12/28/2010 0050   HGBUR NEGATIVE 12/28/2010 0050   BILIRUBINUR NEGATIVE 12/28/2010 0050   KETONESUR NEGATIVE 12/28/2010 0050   PROTEINUR NEGATIVE 12/28/2010 0050   UROBILINOGEN 0.2 12/28/2010 0050   NITRITE NEGATIVE 12/28/2010 0050    LEUKOCYTESUR NEGATIVE 12/28/2010 0050    Radiological Exams on Admission: Dg Chest 2 View  Result Date: 06/07/2016 CLINICAL DATA:  Chest pain with shortness of breath EXAM: CHEST  2 VIEW COMPARISON:  12/27/2010 FINDINGS: Post sternotomy changes. Mild hyperinflation. Diffuse coarsening of the lung interstitium, suspect chronic change. Hazy opacity within the bilateral upper lobes suspicious for superimposed infection or inflammatory process. Tiny bilateral effusions. Mild cardiomegaly. No pneumothorax. Degenerative changes of the spine. IMPRESSION: 1. Diffuse coarsening of the lung interstitium, suspect chronic interstitial changes. Hazy opacities are present within the bilateral upper lobes and are suspicious for superimposed infectious or inflammatory process. 2. Mild cardiomegaly Electronically Signed   By: Donavan Foil M.D.   On: 06/07/2016 01:16   Echocardiogram 07/20/2015 ------------------------------------------------------------------- LV EF: 55% -   60%  ------------------------------------------------------------------- Indications:      (I35.0).  ------------------------------------------------------------------- History:   PMH:  Acquired from the patient and from the patient&'s chart.  Atrial fibrillation.  Coronary artery disease.  Aortic stenosis.  Risk factors:  Hypertension. Dyslipidemia.  ------------------------------------------------------------------- Study Conclusions  - Left ventricle: The cavity size was normal. Wall thickness was   increased in a pattern of mild LVH. Systolic function was normal.   The estimated ejection fraction was in the range of 55% to 60%.   Wall motion was normal; there were no regional wall motion   abnormalities. - Aortic valve: Valve mobility was restricted. There was severe   stenosis. There was moderate regurgitation. Peak velocity (S):   451 cm/s. Mean gradient (S): 43 mm Hg. - Mitral valve: Moderately calcified annulus. There  was mild   regurgitation. - Left atrium: The atrium was moderately dilated. - Pulmonary arteries: Systolic pressure was mildly increased. PA   peak pressure: 32 mm Hg (S).  Impressions:  - Mean gradient 58mmHg from prior echocardiogram. Aortic stenosis   has increased.  EKG: Independently reviewed Vent. rate 94 BPM PR interval * ms QRS duration 108 ms QT/QTc 372/466 ms P-R-T axes * 92 60 Atrial fibrillation Right axis deviation Baseline wander in lead(s) V3  Assessment/Plan Principal Problem:   Hyponatremia Admit to telemetry unit/inpatient. Per patient, he has a previous history of hyponatremia as well as his mother. He is not taking diuretics. He denies potomania. Gentle and time limited IV hydration with NS. Check serum and urine osmolalities. Consider nephrology evaluation.  Active Problems:   Chest pain Cardiac monitoring.. Trend troponin levels. Check echocardiogram in a.m.    CAD (coronary artery disease) Continue atenolol 50 mg by mouth daily. Continue Crestor 10 mg by mouth daily. Continue aspirin 81 mg by mouth daily.    Aortic stenosis, moderate May be the cause of dyspnea. Echocardiogram in a.m. Consider cardiology evaluation.    Essential hypertension Continue atenolol  50 mg by mouth daily. Continue lisinopril 10 mg by mouth daily. Monitor blood pressure.    Dyslipidemia, goal LDL below 70 Continue Crestor 10 mg by mouth daily. Monitor LFTs and fasting lipid as an outpatient.    Atrial fibrillation, permanent (HCC) CHA2DS2-VASc Score of at least 4. Continue Pradaxa. Continue atenolol for rate control.       DVT prophylaxis: On chronic anticoagulation. Code Status: Full code. Family Communication:  Disposition Plan: Admit for IV antibiotic therapy. Consults called:  Admission status: Inpatient/telemetry.   Reubin Milan MD [facility] Pager [phone].  If 7PM-7AM, please contact  night-coverage [email or web address] Password [email or web address]  06/07/2016, 4:20 AM

## 2016-06-07 NOTE — Progress Notes (Addendum)
Patient seen and examined this morning, admitted overnight by Dr. Olevia Bowens.  H&P reviewed, agree with the A/P  In brief, pleasant 83 year old male with history of A. fib on chronic anticoagulation with Pradaxa, coronary artery disease status post CABG, hyperlipidemia, hypertension, severe AS here in the hospital with chest pressure.    Hyponatremia -He is not taking diuretics. He denies potomania. -Probably mildly dehydrated, continue gentle fluids  Chest pain -2D echo is pending this morning, consulted cardiology given concern for progressive aortic stenosis  ?CAP -Continue antibiotics today  CAD (coronary artery disease) -Continue home medications  Aortic stenosis, moderate >> severe -Updated 2D echo pending, cards to evaluate  Essential hypertension -Medications  Dyslipidemia, goal LDL below 70 - Continue Crestor  Atrial fibrillation, permanent (HCC) - CHA2DS2-VASc Score of at least 4. - Continue Pradaxa. - Continue atenolol for rate control    Costin M. Cruzita Lederer, MD [facility] [phone]  If 7PM-7AM, please contact night-coverage [email or web address] Password [facility]  05/26/2016, 5:37 AM

## 2016-06-07 NOTE — Progress Notes (Signed)
Patient complaining of 8/10 chronic low back pain.  Tried tylenol earlier, didn't really help per patient.  Patient has been using heating pads throughout this shift.  Patient would like to try something else for pain if able to.  [facility] p[facility].

## 2016-06-07 NOTE — ED Notes (Signed)
Attempted report 

## 2016-06-07 NOTE — ED Notes (Signed)
Lunch ordered 

## 2016-06-07 NOTE — ED Provider Notes (Signed)
[facility] DEPT Provider Note   CSN: [id number] Arrival date & time: 06/07/16  0030   By signing my name below, I, Arianna Nassar and Margit Banda, attest that this documentation has been prepared under the direction and in the presence of Merryl Hacker, MD.  Electronically Signed: Julien Nordmann, ED Scribe. 06/07/16. 1:07 AM.   History   Chief Complaint Chief Complaint  Patient presents with  . Chest Pain  . Shortness of Breath    HPI HPI Comments: Richard Simon is a 83 y.o. male who has a PMHx of Afib, CAD, CABG x 4, HTN and DLD presents to the Emergency Department by EMS complaining of intermittent, moderate SOB for the past week. He reports associated nausea, diarrhea, cough and lack of appetite. He expresses that his shortness of breath is worse when laying down and he expresses that he has to prop himself up with pillows for mild relief. Pt has hx of cardiac catheterization in 2004. He is not on any home O2 at baseline. Pt is taking Pradaxa and is compliant. He is followed by his Cardiologist, Dr. Ellyn Hack. Pt denies CP, leg swelling, and vomiting.   Of note, last echocardiogram with an EF of 55%. He was noted to have worsening aortic stenosis.    The history is provided by the patient. No language interpreter was used.    Past Medical History:  Diagnosis Date  . Atrial fibrillation, permanent ([place])    Rate controlled. Anticoagulation with Pradaxa.  Marland Kitchen CAD (coronary artery disease) 2004   S/P 4-vessel CABG in 2004  . Dyslipidemia, goal LDL below 70   . Hypertension     very well controlled  . Moderate to severe aortic stenosis 01/2013; 06/2014   Echo Eval: a) 01/2013: Mod calcified AoV w/ reduced mobility of R cusp. Mod-Severe stenosis. Mn-Pk gradient 25 mmHg--39 mmHg. AVA ~ 0.79-0.83 cm. Moderate MR. Normal LV size and function EF 55-60%. Elevated LAP. Mild pulmonary attention. Mild moderately dilated left atrium.;; b) 06/2014: EF 60-65%. Mod-Severe AS, AVA~0.69,  Mn-Pk gradient 32 mmHg -- 55 mmHg, severe LA dilation, moderate-severe RA d  . S/P CABG x 4 2004   Post CABG    Patient Active Problem List   Diagnosis Date Noted  . Fatigue due to treatment 08/22/2014  . Current use of long term anticoagulation 02/13/2013  . Aortic stenosis, moderate   . Essential hypertension   . Dyslipidemia, goal LDL below 70   . Atrial fibrillation, permanent ([place])   . Multivessel Native CAD - s/p CABGx4: LIMA to LAD, SVG-Diag, seqSVG-OM1-OM2 12/11/2002    Past Surgical History:  Procedure Laterality Date  . CARDIAC CATHETERIZATION  12/11/2002   Recommendation-CABG; 80% mid LAD, 70% distal LAD. Mid circumflex 95%, OM1 95%. AV groove circumflex 95%. Proximal RCA 100%  . CORONARY ARTERY BYPASS GRAFT  12/15/2002   LIMA-LAD, SVG-diagonal, SVG-OM1-OM2 sequential  Carlton Adam MYOVIEW  December 2012   Fixed basal inferior diaphragmatic attenuation versusinfarct, no ischemia  . TRANSTHORACIC ECHOCARDIOGRAM  September 2013   Normal EF. > 55%; moderate aortic valve stenosis with mild AI. Gradient: Peak 40 mmHg, mean 24 military. Moderate LA dilation. Moderate TR with RV pressure 30-40 mmHg. Mild MR.  . TRANSTHORACIC ECHOCARDIOGRAM  October 2014   Moderate-severe AS with mild AI. Peak and mean gradients no significant change: 39 mmHg and 25 mmHg;; mild LV dilation with EF 55-60%. Elevated LAP. Moderate MR. Mild to moderate LA dilation. Mildly elevated RV pressure.  Domingo Dimes ECHOCARDIOGRAM  March 2016  EF 60-65%. Mod-Severe AS, AVA~0.69, Mn-Pk gradient 32 mmHg -- 55 mmHg, severe LA dilation, moderate-severe RA dilation: Progression of disease       Home Medications    Prior to Admission medications   Medication Sig Start Date End Date Taking? Authorizing Provider  Acetaminophen 500 MG coapsule Take 1 tablet by mouth daily as needed. 05/18/14   Historical Provider, MD  aspirin EC 81 MG tablet Take 81 mg by mouth daily.    Historical Provider, MD  atenolol  (TENORMIN) 50 MG tablet Take 50 mg by mouth daily. 01/08/13   Historical Provider, MD  CARTIA XT 240 MG 24 hr capsule TAKE 1 CAPSULE EVERY DAY 02/07/16   Leonie Man, MD  co-enzyme Q-10 30 MG capsule Take 1 capsule by mouth daily. 04/28/15   Historical Provider, MD  dabigatran (PRADAXA) 150 MG CAPS capsule Take 1 capsule (150 mg total) by mouth 2 (two) times daily. 04/28/16   Leonie Man, MD  dabigatran (PRADAXA) 150 MG CAPS capsule Take 1 capsule (150 mg total) by mouth 2 (two) times daily. 05/02/16   Leonie Man, MD  folic acid (FOLVITE) [id number] MCG tablet Take 1 tablet by mouth daily. 03/02/15   Historical Provider, MD  Incontinence Supply Disposable (BLADDER CONTROL PADS EXTRA) [place]  08/03/14   Historical Provider, MD  lisinopril (PRINIVIL,ZESTRIL) 10 MG tablet Take 1 tablet by mouth at bedtime. 04/24/15   Historical Provider, MD  Omega-3 Fatty Acids (FISH OIL) 1200 MG CAPS Take 1,200 mg by mouth 2 (two) times daily.    Historical Provider, MD  rosuvastatin (CRESTOR) 10 MG tablet TAKE 1 TABLET (10 MG TOTAL) BY MOUTH DAILY. 03/16/16   Leonie Man, MD    Family History No family history on file.  Social History Social History  Substance Use Topics  . Smoking status: Former Smoker    Quit date: 02/12/1975  . Smokeless tobacco: Never Used  . Alcohol use 8.4 oz/week    14 Glasses of wine per week     Allergies   Patient has no known allergies.   Review of Systems Review of Systems  Constitutional: Positive for appetite change. Negative for fever.  Respiratory: Positive for cough and shortness of breath.   Cardiovascular: Negative for chest pain and leg swelling.  Gastrointestinal: Positive for diarrhea and nausea.  All other systems reviewed and are negative.    Physical Exam Updated Vital Signs BP 124/75   Pulse 89   Temp 99 F (37.2 C) (Oral)   Resp 21   Ht 5\' 9"  (1.753 m)   Wt 189 lb (85.7 kg)   SpO2 94%   BMI 27.91 kg/m   Physical Exam  Constitutional: He  is oriented to person, place, and time. No distress.  Elderly  HENT:  Head: Normocephalic and atraumatic.  Cardiovascular: Normal rate.   Murmur heard. Systolic ejection murmur, irregular rhythm  Pulmonary/Chest: Effort normal and breath sounds normal. No respiratory distress. He has no wheezes.  Fine crackles bilateral bases  Abdominal: Soft. Bowel sounds are normal. There is no tenderness. There is no rebound.  Musculoskeletal: He exhibits edema.  Trace bilateral lower extremity edema  Neurological: He is alert and oriented to person, place, and time.  Skin: Skin is warm and dry.  Psychiatric: He has a normal mood and affect.  Nursing note and vitals reviewed.    ED Treatments / Results  DIAGNOSTIC STUDIES: Oxygen Saturation is 94% on 4 L/min [place], low by my interpretation.  COORDINATION OF CARE: 1:07 AM Discussed treatment plan with pt at bedside and pt agreed to plan.   Labs (all labs ordered are listed, but only abnormal results are displayed) Labs Reviewed  BASIC METABOLIC PANEL - Abnormal; Notable for the following:       Result Value   Sodium 119 (*)    Chloride 89 (*)    CO2 20 (*)    Glucose, Bld 146 (*)    All other components within normal limits  CBC - Abnormal; Notable for the following:    WBC 11.6 (*)    RBC 3.54 (*)    Hemoglobin 11.8 (*)    HCT 32.5 (*)    MCHC 36.3 (*)    All other components within normal limits  BRAIN NATRIURETIC PEPTIDE - Abnormal; Notable for the following:    B Natriuretic Peptide 277.1 (*)    All other components within normal limits  CULTURE, BLOOD (ROUTINE X 2)  CULTURE, BLOOD (ROUTINE X 2)  I-STAT TROPOININ, ED    EKG  EKG Interpretation  Date/Time:  Wednesday June 07 2016 00:39:53 EST Ventricular Rate:  94 PR Interval:    QRS Duration: 108 QT Interval:  372 QTC Calculation: 466 R Axis:   92 Text Interpretation:  Atrial fibrillation Right axis deviation Baseline wander in lead(s) V3 Confirmed by Zyshawn Bohnenkamp  MD,  Genna Casimir ([id number]) on 06/07/2016 1:44:58 AM       Radiology Dg Chest 2 View  Result Date: 06/07/2016 CLINICAL DATA:  Chest pain with shortness of breath EXAM: CHEST  2 VIEW COMPARISON:  12/27/2010 FINDINGS: Post sternotomy changes. Mild hyperinflation. Diffuse coarsening of the lung interstitium, suspect chronic change. Hazy opacity within the bilateral upper lobes suspicious for superimposed infection or inflammatory process. Tiny bilateral effusions. Mild cardiomegaly. No pneumothorax. Degenerative changes of the spine. IMPRESSION: 1. Diffuse coarsening of the lung interstitium, suspect chronic interstitial changes. Hazy opacities are present within the bilateral upper lobes and are suspicious for superimposed infectious or inflammatory process. 2. Mild cardiomegaly Electronically Signed   By: Donavan Foil M.D.   On: 06/07/2016 01:16    Procedures Procedures (including critical care time)  Medications Ordered in ED Medications  sodium chloride 0.9 % bolus 500 mL (not administered)  cefTRIAXone (ROCEPHIN) 1 g in dextrose 5 % 50 mL IVPB (not administered)  azithromycin (ZITHROMAX) 500 mg in dextrose 5 % 250 mL IVPB (not administered)  ondansetron (ZOFRAN) injection 4 mg (4 mg Intravenous Given 06/07/16 0116)     Initial Impression / Assessment and Plan / ED Course  I have reviewed the triage vital signs and the nursing notes.  Pertinent labs & imaging results that were available during my care of the patient were reviewed by me and considered in my medical decision making (see chart for details).    Patient presents with shortness of breath worsening over last week. Does report cough and nausea. He is nontoxic. Afebrile. Denies fever at home. Initial vital signs are reassuring. He is in no acute distress. His symptoms are most suggestive of heart failure; however, infectious etiology is also consideration. Workup notable for significant hyponatremia with a sodium of 119. Has reported  diarrhea but denies vomiting. He is not on any diuretic. Patient was given 500 mL of fluid. He has mild leukocytosis. EKG shows A. fib and no evidence of ischemia. Chest x-ray shows hazy opacities in the bilateral upper lobes suspicious for infection. Blood culture sent patient given antibiotics. No evidence of pleural effusions. Final diagnosis not  totally clear. Patient will be covered with antibiotics for infectious etiology; however, it would likely also be prudent to have cardiology reevaluate him as he has worsening aortic stenosis and is at risk for heart failure.   Final Clinical Impressions(s) / ED Diagnoses   Final diagnoses:  Hyponatremia  SOB (shortness of breath)    New Prescriptions New Prescriptions   No medications on file   I personally performed the services described in this documentation, which was scribed in my presence. The recorded information has been reviewed and is accurate.     Merryl Hacker, MD 06/07/16 [phone]

## 2016-06-07 NOTE — Care Management Note (Signed)
Case Management Note  Patient Details  Name: Richard Simon MRN: [id number] Date of Birth: 08/22/1929  Subjective/Objective:                  From home with spouse. /83 y.o. male who has a PMHx of Afib, CAD, CABG x 4, HTN and DLD presents to the Emergency Department by EMS complaining of intermittent, moderate SOB for the past week.   Action/Plan: Admit status INPATIENT (Hyponatremia); anticipate discharge [place].   Expected Discharge Date:   (unsure)               Expected Discharge Plan:  [place]  In-House Referral:  NA  Discharge planning Services  CM Consult  Post Acute Care Choice:    Choice offered to:     DME Arranged:    DME Agency:     HH Arranged:    HH Agency:     Status of Service:  In process, will continue to follow  If discussed at Long Length of Stay Meetings, dates discussed:    Additional Comments:  Fuller Mandril, RN 06/07/2016, 2:05 PM

## 2016-06-08 ENCOUNTER — Inpatient Hospital Stay (HOSPITAL_COMMUNITY): Payer: Medicare HMO

## 2016-06-08 DIAGNOSIS — R079 Chest pain, unspecified: Secondary | ICD-10-CM

## 2016-06-08 LAB — CBC WITH DIFFERENTIAL/PLATELET
Basophils Absolute: 0 10*3/uL (ref 0.0–0.1)
Basophils Relative: 0 %
EOS ABS: 0 10*3/uL (ref 0.0–0.7)
Eosinophils Relative: 0 %
HEMATOCRIT: 33.1 % — AB (ref 39.0–52.0)
HEMOGLOBIN: 11.7 g/dL — AB (ref 13.0–17.0)
LYMPHS ABS: 0.7 10*3/uL (ref 0.7–4.0)
LYMPHS PCT: 6 %
MCH: 32.6 pg (ref 26.0–34.0)
MCHC: 35.3 g/dL (ref 30.0–36.0)
MCV: 92.2 fL (ref 78.0–100.0)
MONOS PCT: 11 %
Monocytes Absolute: 1.3 10*3/uL — ABNORMAL HIGH (ref 0.1–1.0)
NEUTROS ABS: 10 10*3/uL — AB (ref 1.7–7.7)
NEUTROS PCT: 83 %
Platelets: 192 10*3/uL (ref 150–400)
RBC: 3.59 MIL/uL — ABNORMAL LOW (ref 4.22–5.81)
RDW: 12 % (ref 11.5–15.5)
WBC: 12 10*3/uL — ABNORMAL HIGH (ref 4.0–10.5)

## 2016-06-08 LAB — BASIC METABOLIC PANEL
ANION GAP: 12 (ref 5–15)
Anion gap: 8 (ref 5–15)
Anion gap: 10 (ref 5–15)
Anion gap: 11 (ref 5–15)
BUN: 9 mg/dL (ref 6–20)
BUN: 11 mg/dL (ref 6–20)
BUN: 14 mg/dL (ref 6–20)
BUN: 16 mg/dL (ref 6–20)
CHLORIDE: 88 mmol/L — AB (ref 101–111)
CHLORIDE: 91 mmol/L — AB (ref 101–111)
CHLORIDE: 88 mmol/L — AB (ref 101–111)
CHLORIDE: 88 mmol/L — AB (ref 101–111)
CO2: 21 mmol/L — AB (ref 22–32)
CO2: 21 mmol/L — AB (ref 22–32)
CO2: 23 mmol/L (ref 22–32)
CO2: 20 mmol/L — AB (ref 22–32)
CREATININE: 0.77 mg/dL (ref 0.61–1.24)
Calcium: 9.1 mg/dL (ref 8.9–10.3)
Calcium: 8.6 mg/dL — ABNORMAL LOW (ref 8.9–10.3)
Calcium: 8.9 mg/dL (ref 8.9–10.3)
Calcium: 9.1 mg/dL (ref 8.9–10.3)
Creatinine, Ser: 0.65 mg/dL (ref 0.61–1.24)
Creatinine, Ser: 0.78 mg/dL (ref 0.61–1.24)
Creatinine, Ser: 0.77 mg/dL (ref 0.61–1.24)
GFR calc non Af Amer: >60 mL/min (ref >60)
GFR calc non Af Amer: >60 mL/min (ref >60)
GFR calc non Af Amer: >60 mL/min (ref >60)
GFR calc non Af Amer: >60 mL/min (ref >60)
Glucose, Bld: 129 mg/dL — ABNORMAL HIGH (ref 65–99)
Glucose, Bld: 128 mg/dL — ABNORMAL HIGH (ref 65–99)
Glucose, Bld: 151 mg/dL — ABNORMAL HIGH (ref 65–99)
Glucose, Bld: 140 mg/dL — ABNORMAL HIGH (ref 65–99)
POTASSIUM: 4.3 mmol/L (ref 3.5–5.1)
POTASSIUM: 4.2 mmol/L (ref 3.5–5.1)
POTASSIUM: 4.6 mmol/L (ref 3.5–5.1)
POTASSIUM: 4.6 mmol/L (ref 3.5–5.1)
SODIUM: 121 mmol/L — AB (ref 135–145)
SODIUM: 120 mmol/L — AB (ref 135–145)
SODIUM: 121 mmol/L — AB (ref 135–145)
SODIUM: 119 mmol/L — AB (ref 135–145)

## 2016-06-08 LAB — ECHOCARDIOGRAM COMPLETE
Height: 69 in
Weight: 3030.4 oz

## 2016-06-08 LAB — LEGIONELLA PNEUMOPHILA SEROGP 1 UR AG: L. pneumophila Serogp 1 Ur Ag: NEGATIVE

## 2016-06-08 MED ORDER — SODIUM CHLORIDE 0.9 % IV SOLN
INTRAVENOUS | Status: DC
Start: 2016-06-08 — End: 2016-06-09
  Administered 2016-06-08: 22:00:00 via INTRAVENOUS

## 2016-06-08 NOTE — Progress Notes (Signed)
CRITICAL VALUE ALERT  Critical value received:  Sodium 119   Date of notification:  06/08/2016   Time of notification:  2031  Critical value read back: Yes  Nurse who received alert:  Barbie Haggis, RN  MD notified (1st page):  [facility] NP K. Schorr (face to face)  Time of first page:  2040  Awaiting further instructions.

## 2016-06-08 NOTE — Progress Notes (Signed)
Echocardiogram 2D Echocardiogram has been performed.  Richard Simon 06/08/2016, 4:21 PM

## 2016-06-08 NOTE — Progress Notes (Signed)
PROGRESS NOTE  Richard Simon [id number] DOB: Nov 15, 1929 DOA: 06/07/2016 PCP: Merrilee Seashore, MD   LOS: 1 day   Brief Narrative: Richard Simon is a 83 y.o. male with medical history significant of atrial fibrillation on Pradaxa, CAD, CABG, hyperlipidemia, hypertension, moderate to severe aortic stenosis is coming to the emergency department with complaints of on/off chest pressure, radiates to his back, improved by rest for the past week associated with dyspnea, orthopnea and fatigue.  Assessment & Plan: Principal Problem:   Hyponatremia Active Problems:   Aortic stenosis, moderate   Essential hypertension   Dyslipidemia, goal LDL below 70   Atrial fibrillation, permanent (HCC)   CAD (coronary artery disease)   Chest pain   Hyponatremia -He is not taking diuretics. He denied potomania initially however tells me today that he has been drinking "a lot more water" recently.  He has a history of chronic hyponatremia, states that he has strong family history of hyponatremia as well.  On chart review, his sodium has been 125-130 at least dating back since 2010.  His urine and serum osmolalities are low, place patient on fluid restriction.  Discussed about restricting free water with patient as well at bedside.  Sodium still remains low at 120.  Would like to see greater than 125 prior to stability for discharge  Chest pain -2D echo is pending, consulted cardiology given concern for progressive aortic stenosis  CAP -Continue antibiotics today, clinically improving  CAD (coronary artery disease) -Continue home medications, currently without any chest pain  Aortic stenosis, moderate >> severe -Updated 2D echo pending, cards following  Essential hypertension -Medications  Dyslipidemia, goal LDL below 70 - Continue Crestor  Atrial fibrillation, permanent (HCC) - CHA2DS2-VASc Score of at least 4. - Continue Pradaxa. - Continue atenolol for rate control   DVT prophylaxis:  Pradaxa Code Status: Full code Family Communication: no family at bedside Disposition Plan: remain inpatient  Consultants:   Cardiology   Procedures:   2D echo: pending  Antimicrobials:  Ceftriaxone 2/21 >>   azithromycin 2/21 >>  Subjective: - no chest pain, shortness of breath, no abdominal pain, nausea or vomiting.   Objective: Vitals:   06/07/16 1938 06/08/16 0016 06/08/16 0518 06/08/16 0813  BP: (!) 128/92 (!) 134/98 103/77 117/71  Pulse: 87 (!) 110 61 87  Resp: (!) 25 (!) 30 (!) 26 (!) 25  Temp: 98.2 F (36.8 C) 99.2 F (37.3 C) 98.1 F (36.7 C) 98.5 F (36.9 C)  TempSrc: Oral Oral Oral Oral  SpO2: 95% 94% 98% 98%  Weight:   85.9 kg (189 lb 6.4 oz)   Height:        Intake/Output Summary (Last 24 hours) at 06/08/16 0932 Last data filed at 06/08/16 0308  Gross per 24 hour  Intake           474.17 ml  Output             1675 ml  Net         -1200.83 ml   Filed Weights   06/07/16 0034 06/08/16 0518  Weight: 85.7 kg (189 lb) 85.9 kg (189 lb 6.4 oz)    Examination: Constitutional: NAD Vitals:   06/07/16 1938 06/08/16 0016 06/08/16 0518 06/08/16 0813  BP: (!) 128/92 (!) 134/98 103/77 117/71  Pulse: 87 (!) 110 61 87  Resp: (!) 25 (!) 30 (!) 26 (!) 25  Temp: 98.2 F (36.8 C) 99.2 F (37.3 C) 98.1 F (36.7 C) 98.5 F (36.9 C)  TempSrc: Oral Oral  Oral Oral  SpO2: 95% 94% 98% 98%  Weight:   85.9 kg (189 lb 6.4 oz)   Height:       Eyes: PERRL, lids and conjunctivae normal Respiratory: clear to auscultation bilaterally, no wheezing, no crackles.  Cardiovascular: Regular rate and rhythm, no murmurs / rubs / gallops. No LE edema. 3/6 SEM Abdomen: no tenderness. Bowel sounds positive.  Musculoskeletal: no clubbing / cyanosis. No joint deformity upper and lower extremities. No contractures. Normal muscle tone.  Neurologic: CN 2-12 grossly intact. Strength 5/5 in all 4.  Psychiatric: Normal judgment and insight. Alert and oriented x 3. Normal mood.     Data Reviewed: I have personally reviewed following labs and imaging studies  CBC:  Recent Labs Lab 06/07/16 0044 06/08/16 0236  WBC 11.6* 12.0*  NEUTROABS  --  10.0*  HGB 11.8* 11.7*  HCT 32.5* 33.1*  MCV 91.8 92.2  PLT 187 [id number]   Basic Metabolic Panel:  Recent Labs Lab 06/07/16 0730 06/07/16 1357 06/07/16 2003 06/08/16 0235 06/08/16 0656  NA 120* 123* 122* 121* 120*  K 4.3 4.1 4.4 4.3 4.2  CL 88* 93* 92* 88* 91*  CO2 25 21* 23 21* 21*  GLUCOSE 113* 169* 134* 129* 128*  BUN 10 8 9 9 11   CREATININE 0.70 0.64 0.71 0.77 0.65  CALCIUM 8.8* 8.7* 8.8* 9.1 8.6*   GFR: Estimated Creatinine Clearance: 74.7 mL/min (by C-G formula based on SCr of 0.65 mg/dL). Liver Function Tests: No results for input(s): AST, ALT, ALKPHOS, BILITOT, PROT, ALBUMIN in the last 168 hours. No results for input(s): LIPASE, AMYLASE in the last 168 hours. No results for input(s): AMMONIA in the last 168 hours. Coagulation Profile: No results for input(s): INR, PROTIME in the last 168 hours. Cardiac Enzymes:  Recent Labs Lab 06/07/16 0723 06/07/16 1357  TROPONINI <0.03 <0.03   BNP (last 3 results) No results for input(s): PROBNP in the last 8760 hours. HbA1C: No results for input(s): HGBA1C in the last 72 hours. CBG: No results for input(s): GLUCAP in the last 168 hours. Lipid Profile: No results for input(s): CHOL, HDL, LDLCALC, TRIG, CHOLHDL, LDLDIRECT in the last 72 hours. Thyroid Function Tests: No results for input(s): TSH, T4TOTAL, FREET4, T3FREE, THYROIDAB in the last 72 hours. Anemia Panel: No results for input(s): VITAMINB12, FOLATE, FERRITIN, TIBC, IRON, RETICCTPCT in the last 72 hours. Urine analysis:    Component Value Date/Time   COLORURINE YELLOW 12/28/2010 0050   APPEARANCEUR CLEAR 12/28/2010 0050   LABSPEC 1.008 12/28/2010 0050   PHURINE 7.0 12/28/2010 0050   GLUCOSEU NEGATIVE 12/28/2010 0050   HGBUR NEGATIVE 12/28/2010 0050   BILIRUBINUR NEGATIVE 12/28/2010  0050   KETONESUR NEGATIVE 12/28/2010 0050   PROTEINUR NEGATIVE 12/28/2010 0050   UROBILINOGEN 0.2 12/28/2010 0050   NITRITE NEGATIVE 12/28/2010 0050   LEUKOCYTESUR NEGATIVE 12/28/2010 0050   Sepsis Labs: Invalid input(s): PROCALCITONIN, LACTICIDVEN  Recent Results (from the past 240 hour(s))  Culture, sputum-assessment     Status: None   Collection Time: 06/07/16 11:21 AM  Result Value Ref Range Status   Specimen Description SPUTUM  Final   Special Requests Immunocompromised  Final   Sputum evaluation THIS SPECIMEN IS ACCEPTABLE FOR SPUTUM CULTURE  Final   Report Status 06/07/2016 FINAL  Final  Culture, respiratory (NON-Expectorated)     Status: None (Preliminary result)   Collection Time: 06/07/16 11:21 AM  Result Value Ref Range Status   Specimen Description SPUTUM  Final   Special Requests Immunocompromised Reflexed from [phone]  Final   Gram Stain   Final    MODERATE WBC PRESENT, PREDOMINANTLY PMN FEW SQUAMOUS EPITHELIAL CELLS PRESENT RARE GRAM POSITIVE COCCOBACILLUS RARE GRAM POSITIVE COCCI IN PAIRS RARE GRAM NEGATIVE COCCOBACILLI    Culture CULTURE REINCUBATED FOR BETTER GROWTH  Final   Report Status PENDING  Incomplete  MRSA PCR Screening     Status: None   Collection Time: 06/07/16  3:05 PM  Result Value Ref Range Status   MRSA by PCR NEGATIVE NEGATIVE Final    Comment:        The GeneXpert MRSA Assay (FDA approved for NASAL specimens only), is one component of a comprehensive MRSA colonization surveillance program. It is not intended to diagnose MRSA infection nor to guide or monitor treatment for MRSA infections.       Radiology Studies: Dg Chest 2 View  Result Date: 06/07/2016 CLINICAL DATA:  Chest pain with shortness of breath EXAM: CHEST  2 VIEW COMPARISON:  12/27/2010 FINDINGS: Post sternotomy changes. Mild hyperinflation. Diffuse coarsening of the lung interstitium, suspect chronic change. Hazy opacity within the bilateral upper lobes suspicious for  superimposed infection or inflammatory process. Tiny bilateral effusions. Mild cardiomegaly. No pneumothorax. Degenerative changes of the spine. IMPRESSION: 1. Diffuse coarsening of the lung interstitium, suspect chronic interstitial changes. Hazy opacities are present within the bilateral upper lobes and are suspicious for superimposed infectious or inflammatory process. 2. Mild cardiomegaly Electronically Signed   By: Donavan Foil M.D.   On: 06/07/2016 01:16     Scheduled Meds: . aspirin EC  81 mg Oral Daily  . atenolol  50 mg Oral Daily  . azithromycin  500 mg Intravenous Q24H  . cefTRIAXone (ROCEPHIN)  IV  1 g Intravenous Q24H  . dabigatran  150 mg Oral BID  . diltiazem  240 mg Oral Daily  . Influenza vac split quadrivalent PF  0.5 mL Intramuscular Tomorrow-1000  . lisinopril  10 mg Oral QHS  . pneumococcal 23 valent vaccine  0.5 mL Intramuscular Tomorrow-1000  . rosuvastatin  10 mg Oral Daily  . sodium chloride flush  3 mL Intravenous Q12H   Continuous Infusions:   Marzetta Board, MD, PhD [facility] Pager [phone]  If 7PM-7AM, please contact night-coverage [email or web address] Password [facility] 06/08/2016, 9:32 AM

## 2016-06-08 NOTE — Consult Note (Signed)
            [facility] CM Primary Care Navigator  06/08/2016  Richard Simon 1929-06-28 [id number]   Seen patient at the bedside to identify possible discharge needs. Patient reports having difficulty breathing that had led to this admission.  Patient endorses Richard Simon with [facility] as the primary care provider.    Patient shared using Richard Simon Mail Order service to obtain medications without any problem.   Patient states that he manages his medications at home using "pill box" system weekly.   He reports that he drives prior to admission. Son Richard Simon) can provide transportation to his doctors' appointments if needed. Discussed with patient about his [employer] transportation benefits and was grateful for the information.  Patient mentioned that son lives with him and can provide assistance with his care at home as needed. Wife is unable to physically assist him as stated.  Discharge plan is home with home health services.  Patient voiced understanding to call primary care provider's office when he returns home, for a post discharge follow-up appointment within a week or sooner if needs arise. Patient letter (with PCP's contact number) was provided as his reminder.  Patient communicated no health management needs or concerns at present.  For additional questions please contact:  Richard Simon, BSN, RN-BC [facility] PRIMARY CARE Navigator Cell: [phone]

## 2016-06-08 NOTE — Evaluation (Signed)
Physical Therapy Evaluation Patient Details Name: Richard Simon MRN: [id number] DOB: 25-May-1929 Today's Date: 06/08/2016   History of Present Illness  Pt is a 83 y.o. male with medical history significant of atrial fibrillation on Pradaxa, CAD, CABG, hyperlipidemia, hypertension, moderate to severe aortic stenosis is coming to the emergency department with complaints of on/off chest pressure, associated with dyspnea, orthopnea and fatigue. He was diagnosed with hyponatremia  Clinical Impression  Pt appears to be confused as he needs directional cues for pathfinding and while discussing his current mobility concerns.  He expresses that his son is variable in amount of assist he is able to provide to pt and to pt's wife (who has mobility limitations). Will continue to follow patient while on this venue of care to progress mobility    Follow Up Recommendations Home health PT;Supervision for mobility/OOB    Equipment Recommendations  Rolling walker with 5" wheels    Recommendations for Other Services       Precautions / Restrictions Precautions Precautions: Fall Restrictions Weight Bearing Restrictions: No      Mobility  Bed Mobility Overal bed mobility: Independent             General bed mobility comments: sit to/from supine  Transfers Overall transfer level: Needs assistance Equipment used: Rolling walker (2 wheeled) Transfers: Sit to/from [facility] Sit to Stand: Min guard Stand pivot transfers: Min assist       General transfer comment: min cues for safety during pivot transfer in congested area  Ambulation/Gait Ambulation/Gait assistance: Min assist Ambulation Distance (Feet): 150 Feet Assistive device: Rolling walker (2 wheeled) Gait Pattern/deviations: Decreased step length - right;Decreased step length - left;Drifts right/left;Trunk flexed     General Gait Details: bumps into items on both left and right sides of hall, needs verbal cues to  attend to furniture and obstacles in hallway.  Stairs            Wheelchair Mobility    Modified Rankin (Stroke Patients Only)       Balance Overall balance assessment: Needs assistance Sitting-balance support: No upper extremity supported;Feet supported Sitting balance-Leahy Scale: Good     Standing balance support: Bilateral upper extremity supported Standing balance-Leahy Scale: Poor                               Pertinent Vitals/Pain Pain Assessment: No/denies pain    Home Living Family/patient expects to be discharged to:: Private residence Living Arrangements: Spouse/significant other;Children Available Help at Discharge: Family;Available PRN/intermittently Type of Home: House         Home Equipment: None Additional Comments: wife has walker but she is unable to mobilize much per pt, son has anxiety issues and he generally helps with physical chores around house    Prior Function Level of Independence: Independent         Comments: without device     Hand Dominance   Dominant Hand: Right    Extremity/Trunk Assessment   Upper Extremity Assessment Upper Extremity Assessment: Overall WFL for tasks assessed    Lower Extremity Assessment Lower Extremity Assessment: Overall WFL for tasks assessed    Cervical / Trunk Assessment Cervical / Trunk Assessment: Kyphotic  Communication   Communication: HOH  Cognition Arousal/Alertness: Awake/alert Behavior During Therapy: WFL for tasks assessed/performed Overall Cognitive Status: Impaired/Different from baseline Area of Impairment: Orientation;Safety/judgement Orientation Level: Disoriented to;Time;Situation       Safety/Judgement: Decreased awareness of safety;Decreased awareness of  deficits     General Comments: decr anticipatory awareness    General Comments General comments (skin integrity, edema, etc.): oxygen saturation during gait with 3L O2 >94%, wet, non-productive  cough, DUE 3/4    Exercises     Assessment/Plan    PT Assessment Patient needs continued PT services  PT Problem List Decreased activity tolerance;Decreased balance;Decreased mobility;Decreased knowledge of use of DME;Decreased safety awareness;Cardiopulmonary status limiting activity       PT Treatment Interventions DME instruction;Gait training;Functional mobility training;Therapeutic exercise;Balance training;Patient/family education    PT Goals (Current goals can be found in the Care Plan section)  Acute Rehab PT Goals Patient Stated Goal: get my breath PT Goal Formulation: With patient Time For Goal Achievement: 06/16/16 Potential to Achieve Goals: Good    Frequency Min 3X/week   Barriers to discharge Decreased caregiver support unsure how much assist pt will have at home upon discharge (son's schedule), wife will not be able to assist    Co-evaluation               End of Session Equipment Utilized During Treatment: Gait belt Activity Tolerance: Patient limited by fatigue;Patient tolerated treatment well Patient left: in bed;with call bell/phone within reach;with bed alarm set Nurse Communication: Mobility status PT Visit Diagnosis: Unsteadiness on feet (R26.81)         Time: 1326-1405 PT Time Calculation (min) (ACUTE ONLY): 39 min   Charges:   PT Evaluation $PT Eval Moderate Complexity: 1 Procedure PT Treatments $Gait Training: 8-22 mins   PT G CodesSuszanne Finch, PT [phone]  [place] 06/08/2016, 2:18 PM

## 2016-06-09 ENCOUNTER — Inpatient Hospital Stay (HOSPITAL_COMMUNITY): Payer: Medicare HMO

## 2016-06-09 DIAGNOSIS — I482 Chronic atrial fibrillation: Secondary | ICD-10-CM

## 2016-06-09 LAB — BASIC METABOLIC PANEL
Anion gap: 9 (ref 5–15)
Anion gap: 11 (ref 5–15)
Anion gap: 11 (ref 5–15)
BUN: 17 mg/dL (ref 6–20)
BUN: 17 mg/dL (ref 6–20)
BUN: 17 mg/dL (ref 6–20)
CO2: 21 mmol/L — ABNORMAL LOW (ref 22–32)
CO2: 20 mmol/L — ABNORMAL LOW (ref 22–32)
CO2: 23 mmol/L (ref 22–32)
Calcium: 8.8 mg/dL — ABNORMAL LOW (ref 8.9–10.3)
Calcium: 8.8 mg/dL — ABNORMAL LOW (ref 8.9–10.3)
Calcium: 9.2 mg/dL (ref 8.9–10.3)
Chloride: 87 mmol/L — ABNORMAL LOW (ref 101–111)
Chloride: 87 mmol/L — ABNORMAL LOW (ref 101–111)
Chloride: 86 mmol/L — ABNORMAL LOW (ref 101–111)
Creatinine, Ser: 0.62 mg/dL (ref 0.61–1.24)
Creatinine, Ser: 0.7 mg/dL (ref 0.61–1.24)
Creatinine, Ser: 0.78 mg/dL (ref 0.61–1.24)
GFR calc Af Amer: >60 mL/min (ref >60)
GFR calc Af Amer: >60 mL/min (ref >60)
GFR calc Af Amer: >60 mL/min (ref >60)
GFR calc non Af Amer: >60 mL/min (ref >60)
GFR calc non Af Amer: >60 mL/min (ref >60)
GLUCOSE: 180 mg/dL — AB (ref 65–99)
Glucose, Bld: 128 mg/dL — ABNORMAL HIGH (ref 65–99)
Glucose, Bld: 139 mg/dL — ABNORMAL HIGH (ref 65–99)
Potassium: 4.6 mmol/L (ref 3.5–5.1)
Potassium: 4.1 mmol/L (ref 3.5–5.1)
Potassium: 4.5 mmol/L (ref 3.5–5.1)
Sodium: 117 mmol/L — CL (ref 135–145)
Sodium: 118 mmol/L — CL (ref 135–145)
Sodium: 120 mmol/L — ABNORMAL LOW (ref 135–145)

## 2016-06-09 LAB — CULTURE, RESPIRATORY: CULTURE: NORMAL

## 2016-06-09 LAB — CULTURE, RESPIRATORY W GRAM STAIN

## 2016-06-09 LAB — CBC WITH DIFFERENTIAL/PLATELET
Basophils Absolute: 0 10*3/uL (ref 0.0–0.1)
Basophils Relative: 0 %
EOS ABS: 0 10*3/uL (ref 0.0–0.7)
EOS PCT: 0 %
HCT: 30.9 % — ABNORMAL LOW (ref 39.0–52.0)
Hemoglobin: 10.9 g/dL — ABNORMAL LOW (ref 13.0–17.0)
LYMPHS ABS: 0.7 10*3/uL (ref 0.7–4.0)
LYMPHS PCT: 5 %
MCH: 32.2 pg (ref 26.0–34.0)
MCHC: 35.3 g/dL (ref 30.0–36.0)
MCV: 91.4 fL (ref 78.0–100.0)
MONOS PCT: 11 %
Monocytes Absolute: 1.5 10*3/uL — ABNORMAL HIGH (ref 0.1–1.0)
Neutro Abs: 11.6 10*3/uL — ABNORMAL HIGH (ref 1.7–7.7)
Neutrophils Relative %: 84 %
PLATELETS: 214 10*3/uL (ref 150–400)
RBC: 3.38 MIL/uL — ABNORMAL LOW (ref 4.22–5.81)
RDW: 12.1 % (ref 11.5–15.5)
WBC: 13.9 10*3/uL — ABNORMAL HIGH (ref 4.0–10.5)

## 2016-06-09 LAB — OSMOLALITY, URINE: Osmolality, Ur: 758 mOsm/kg (ref 300–900)

## 2016-06-09 LAB — CORTISOL: Cortisol, Plasma: 55.4 ug/dL

## 2016-06-09 LAB — SODIUM, URINE, RANDOM: Sodium, Ur: <10 mmol/L

## 2016-06-09 LAB — TSH: TSH: 1.319 u[IU]/mL (ref 0.350–4.500)

## 2016-06-09 MED ORDER — FUROSEMIDE 10 MG/ML IJ SOLN
20.0000 mg | Freq: Once | INTRAMUSCULAR | Status: AC
  Administered 2016-06-09: 20 mg via INTRAVENOUS

## 2016-06-09 MED ORDER — FUROSEMIDE 10 MG/ML IJ SOLN
40.0000 mg | Freq: Once | INTRAMUSCULAR | Status: AC
  Administered 2016-06-09: 40 mg via INTRAVENOUS
  Filled 2016-06-09: qty 4

## 2016-06-09 MED ORDER — FUROSEMIDE 10 MG/ML IJ SOLN
INTRAMUSCULAR | Status: AC
  Filled 2016-06-09: qty 2

## 2016-06-09 MED ORDER — LEVALBUTEROL HCL 1.25 MG/0.5ML IN NEBU
1.2500 mg | INHALATION_SOLUTION | Freq: Once | RESPIRATORY_TRACT | Status: AC
  Administered 2016-06-09: 1.25 mg via RESPIRATORY_TRACT
  Filled 2016-06-09: qty 0.5

## 2016-06-09 NOTE — Care Management (Addendum)
Case Management Note Initial Note Started BY Fuller Mandril, RN,BSN  Patient Details  Name: Richard Simon MRN: [id number] Date of Birth: 1929/07/21  Subjective/Objective:                  From home with spouse. /83 y.o.malewho has a PMHx of Afib, CAD, CABG x 4, HTN and DLD presents to the Emergency Department by EMS complaining of intermittent, moderate SOB for the past week.   Action/Plan: Admit status INPATIENT (Hyponatremia); anticipate discharge [place].   Expected Discharge Date:   (unsure)                     Expected Discharge Plan:  [place]  In-House Referral:  NA  Discharge planning Services  CM Consult  Post Acute Care Choice:  N/A  Choice offered to:   N/A  DME Arranged:   N/A DME Agency:   N/A  HH Arranged: N/A   HH Agency:   N/A  Status of Service:  COMPLETED  If discussed at [place] of Stay Meetings, dates discussed:  06-13-16, 06-20-16, 06-22-16  Additional Comments: 06-22-16 1144 [name], RN,BSN [phone] TAVR planned for 06-22-16. Plan will be for SNF once stable for d/c. CSW is following. CM will continue to monitor.   06-20-16 [address], [place] [phone] Diuresing with IV lasix. Aortic Stenosis- Dental consulted and pt had multiple extractions 06-20-16. Plan for TAVR- post will be SNF once stable. CSW monitoring.    1031 06-13-16 [name], RN,BSN [phone] PT recommendations for SNF. CSW is assisting with disposition needs. CM will continue to follow the patient as well.    1617 06-12-16 [name], RN,BSN [phone] CM did speak with son Holzheimer,[name] @ [phone] (cell phone). Son to visit today around 6pm. Son [name] stated the house phone has been out over the weekend and he is providing care to the patient's wife. CM will f/u for home needs.     1036 06-09-16 [name], RN,BSN [phone] CM did speak with pt in regards to disposition  needs. Pt is from home with wife and son. CM was able to speak with son and husband has been the caregiver to wife with Parkinson's Disease. Son was a little overwhelmed with the care that is being needed for his mother. Pt sleepy at time of visit-Na levels low. Unable to make a decision on home Health Agency at this time. [place] Agency List left at the bedside. CM will continue to follow discharge plan of care with patient.

## 2016-06-09 NOTE — Progress Notes (Signed)
Follow-up:  Notified last evening of persistent decline in serum Na. At 2000 Na 119 from 121. Had been on fluid restriction > 24 hrs. Urine osmol 220 and urine Na <10. Started on IV NS at 75/hr. This am Na 117. RN reports pt has been pleasantly confused but is more confused this am.Discussed pt w/ Dr Florene Glen w/ renal service who has recommended increasing fluid restriction to 800cc q24h. Will repeat urine Na and osmol. Renal service will see pt this am in consultation. Appreciate renal service input. Will continue to monitor closely on SDU.   Jeryl Columbia, NP-C [facility] Pager [phone]

## 2016-06-09 NOTE — Progress Notes (Signed)
  CRITICAL VALUE ALERT  Critical value received: Na 118  Date of notification:  06/09/16  Time of notification:  [id number]  Critical value read back:Yes.    Nurse who received alert:  Edward Qualia RN  MD notified (1st page): DR Cruzita Lederer  Time of first page:  [phone]  Responding MD: DR Cruzita Lederer  Time MD responded:  [phone]

## 2016-06-09 NOTE — Progress Notes (Signed)
CRITICAL VALUE ALERT  Critical value received:  Sodium 117  Date of notification:  06/09/2016  Time of notification:  0538  Critical value read back: yes  Nurse who received alert:  Barbie Haggis, RN  MD notified (1st page):  [facility] p[facility]   Time of first page:  [phone]  Awaiting order or call back.

## 2016-06-09 NOTE — Consult Note (Signed)
Reason for Consult: Hyponatremia Referring Physician:  Cruzita Lederer, MD  Richard Simon is an 83 y.o. male.  HPI: Pt has a PMH significant for CAD s/p CABG, moderate to severe AS, paroxysmal AFib, HTN, and hyperlipidemia who presented to [facility] ED on 06/07/16 with a 1 week history of progressive dyspnea, orthopnea, generalizzed fatigue and a 1 day history of chest tightness with radiation to the upper back.  He was admitted for possible pneumonia and hyponatremia (sodium level of 119).  Cardiology was consulted and ECHO revealed critical AS and need for further evaluation.  His Sodium levels initially improved but have been dropping over the last 24 hours and we were consulted to further evaluate the etiology of his hyponatremia.  The trend in Scr is seen below and he has had some chronic hyponatremia in the past.  His Uosm was initially appropriately low (but not maximally) at 220 Sosm 258 and UNa was <10.  He was initially placed on IVF's with NS and sodium improved but then started to drop overnight and was placed on fluid restriction.  Repeat Uosm was inappropriately high at 758 and UNa remained low at <10.   His CXR revealed pulmonary edema despite only receiving 1534ml of IV NS.  He was given lasix x 1 dose this morning and we were consulted.    Trend in Serum Sodium Levels:  Sodium  Date/Time Value Ref Range Status  06/09/2016 04:48 AM 117 (LL) 135 - 145 mmol/L Final  06/08/2016 07:27 PM 119 (LL) 135 - 145 mmol/L Final  06/08/2016 01:34 PM 121 (L) 135 - 145 mmol/L Final  06/08/2016 06:56 AM 120 (L) 135 - 145 mmol/L Final  06/08/2016 02:35 AM 121 (L) 135 - 145 mmol/L Final  06/07/2016 08:03 PM 122 (L) 135 - 145 mmol/L Final  06/07/2016 01:57 PM 123 (L) 135 - 145 mmol/L Final  06/07/2016 07:30 AM 120 (L) 135 - 145 mmol/L Final  06/07/2016 12:44 AM 119 (LL) 135 - 145 mmol/L Final  12/29/2010 05:20 AM 135 135 - 145 mEq/L Final  12/27/2010 09:50 PM 130 (L) 135 - 145 mEq/L Final  08/25/2009 01:02 AM 125 (L)  135 - 145 mEq/L Final  01/03/2009 05:10 PM 128 (L) 135 - 145 mEq/L Final   PMH:   Past Medical History:  Diagnosis Date  . Atrial fibrillation, permanent ([place])    Rate controlled. Anticoagulation with Pradaxa.  Mar[place] Kitchen CAD (coronary artery disease) 2004   S/P 4-vessel CABG in 2004  . Dyslipidemia, goal LDL below 70   . Hypertension     very well controlled  . Moderate to severe aortic stenosis 01/2013; 06/2014   Echo Eval: a) 01/2013: Mod calcified AoV w/ reduced mobility of R cusp. Mod-Severe stenosis. Mn-Pk gradient 25 mmHg--39 mmHg. AVA ~ 0.79-0.83 cm. Moderate MR. Normal LV size and function EF 55-60%. Elevated LAP. Mild pulmonary attention. Mild moderately dilated left atrium.;; b) 06/2014: EF 60-65%. Mod-Severe AS, AVA~0.69, Mn-Pk gradient 32 mmHg -- 55 mmHg, severe LA dilation, moderate-severe RA d  . S/P CABG x 4 2004   Post CABG    PSH:   Past Surgical History:  Procedure Laterality Date  . CARDIAC CATHETERIZATION  12/11/2002   Recommendation-CABG; 80% mid LAD, 70% distal LAD. Mid circumflex 95%, OM1 95%. AV groove circumflex 95%. Proximal RCA 100%  . CORONARY ARTERY BYPASS GRAFT  12/15/2002   LIMA-LAD, SVG-diagonal, SVG-OM1-OM2 sequential  Carlton Adam MYOVIEW  December 2012   Fixed basal inferior diaphragmatic attenuation versusinfarct, no ischemia  . TRANSTHORACIC  ECHOCARDIOGRAM  September 2013   Normal EF. > 55%; moderate aortic valve stenosis with mild AI. Gradient: Peak 40 mmHg, mean 24 military. Moderate LA dilation. Moderate TR with RV pressure 30-40 mmHg. Mild MR.  . TRANSTHORACIC ECHOCARDIOGRAM  October 2014   Moderate-severe AS with mild AI. Peak and mean gradients no significant change: 39 mmHg and 25 mmHg;; mild LV dilation with EF 55-60%. Elevated LAP. Moderate MR. Mild to moderate LA dilation. Mildly elevated RV pressure.  . TRANSTHORACIC ECHOCARDIOGRAM  March 2016   EF 60-65%. Mod-Severe AS, AVA~0.69, Mn-Pk gradient 32 mmHg -- 55 mmHg, severe LA dilation,  moderate-severe RA dilation: Progression of disease    Allergies: No Known Allergies  Medications:   Prior to Admission medications   Medication Sig Start Date End Date Taking? Authorizing Provider  Acetaminophen 500 MG coapsule Take 1 tablet by mouth every 6 (six) hours as needed for pain.  05/18/14  Yes Historical Provider, MD  aspirin EC 81 MG tablet Take 81 mg by mouth daily.   Yes Historical Provider, MD  atenolol (TENORMIN) 50 MG tablet Take 50 mg by mouth daily. 01/08/13  Yes Historical Provider, MD  CARTIA XT 240 MG 24 hr capsule TAKE 1 CAPSULE EVERY DAY 02/07/16  Yes Leonie Man, MD  co-enzyme Q-10 30 MG capsule Take 1 capsule by mouth daily. 04/28/15  Yes Historical Provider, MD  dabigatran (PRADAXA) 150 MG CAPS capsule Take 1 capsule (150 mg total) by mouth 2 (two) times daily. 05/02/16  Yes Leonie Man, MD  lisinopril (PRINIVIL,ZESTRIL) 10 MG tablet Take 1 tablet by mouth at bedtime. 04/24/15  Yes Historical Provider, MD  Omega-3 Fatty Acids (FISH OIL) 1200 MG CAPS Take 1,200 mg by mouth 2 (two) times daily.   Yes Historical Provider, MD  rosuvastatin (CRESTOR) 10 MG tablet TAKE 1 TABLET (10 MG TOTAL) BY MOUTH DAILY. 03/16/16  Yes Leonie Man, MD    Discontinued Meds:   Medications Discontinued During This Encounter  Medication Reason  . dabigatran (PRADAXA) 150 MG CAPS capsule Duplicate  . Incontinence Supply Disposable (BLADDER CONTROL PADS EXTRA) MISC Inpatient Standard  . folic acid (FOLVITE) [id number] MCG tablet Completed Course  . 0.9 %  sodium chloride infusion   . enoxaparin (LOVENOX) injection 40 mg   . 0.9 %  sodium chloride infusion     Social History:  reports that he quit smoking about 41 years ago. He has never used smokeless tobacco. He reports that he drinks about 8.4 oz of alcohol per week . His drug history is not on file.  Family History:   Family History  Problem Relation Age of Onset  . Hip fracture Mother     Pertinent items are noted in  HPI.  Blood pressure 132/79, pulse 91, temperature 98 F (36.7 C), temperature source Oral, resp. rate (!) 24, height 5\' 9"  (1.753 m), weight 85.9 kg (189 lb 6.4 oz), SpO2 92 %. General appearance: appears stated age, fatigued, no distress and slowed mentation Head: Normocephalic, without obvious abnormality, atraumatic Resp: rales bilaterally Cardio: irregularly irregular rhythm, no rub and III/VI SM at LUSB GI: soft, non-tender; bowel sounds normal; no masses,  no organomegaly Extremities: extremities normal, atraumatic, no cyanosis or edema  Labs: Basic Metabolic Panel:  Recent Labs Lab 06/07/16 1357 06/07/16 2003 06/08/16 0235 06/08/16 0656 06/08/16 1334 06/08/16 1927 06/09/16 0448  NA 123* 122* 121* 120* 121* 119* 117*  K 4.1 4.4 4.3 4.2 4.6 4.6 4.6  CL 93* 92* 88* 91*  88* 88* 87*  CO2 21* 23 21* 21* 23 20* 21*  GLUCOSE 169* 134* 129* 128* 151* 140* 128*  BUN 8 9 9 11 14 16 17   CREATININE 0.64 0.71 0.77 0.65 0.78 0.77 0.62  CALCIUM 8.7* 8.8* 9.1 8.6* 8.9 9.1 8.8*   Liver Function Tests: No results for input(s): AST, ALT, ALKPHOS, BILITOT, PROT, ALBUMIN in the last 168 hours. No results for input(s): LIPASE, AMYLASE in the last 168 hours. No results for input(s): AMMONIA in the last 168 hours. CBC:  Recent Labs Lab 06/07/16 0044 06/08/16 0236 06/09/16 0448  WBC 11.6* 12.0* 13.9*  NEUTROABS  --  10.0* 11.6*  HGB 11.8* 11.7* 10.9*  HCT 32.5* 33.1* 30.9*  MCV 91.8 92.2 91.4  PLT 187 192 214   PT/INR: @labrcntip (inr:5) Cardiac Enzymes:  Recent Labs Lab 06/07/16 0723 06/07/16 1357  TROPONINI <0.03 <0.03   CBG: No results for input(s): GLUCAP in the last 168 hours.  Iron Studies: No results for input(s): IRON, TIBC, TRANSFERRIN, FERRITIN in the last 168 hours.  Xrays/Other Studies: Dg Chest Port 1 View  Result Date: 06/09/2016 CLINICAL DATA:  Shortness of Breath EXAM: PORTABLE CHEST 1 VIEW COMPARISON:  June 07, 2016 and December 27, 2010  FINDINGS: There is diffuse interstitial prominence, primarily in the upper lobes superimposed on chronic interstitial thickening. There is patchy airspace opacity in the upper lobes and left mid lung region superimposed on interstitial edema. There is cardiomegaly with pulmonary venous hypertension. There are small pleural effusions bilaterally. There is atherosclerotic calcification in the aorta. Patient is status post coronary artery bypass grafting. No bone lesions. No adenopathy. IMPRESSION: Findings felt to represent congestive heart failure superimposed on chronic interstitial thickening. Relative airspace opacity in portions of the upper lobes likely represents alveolar edema. A degree of superimposed pneumonia in the upper lobes cannot be excluded radiographically. There is aortic atherosclerosis. Appearance is essentially stable compared to 1 day prior. Electronically Signed   By: Lowella Grip III M.D.   On: 06/09/2016 08:04     Assessment/Plan: 1.  Hyponatremia- initially consistent with volume depletion (low UNa and low Uosm with some component of reset osmostat as Uosm was not maximally dillute at 222) but sodium worsened with IVF's and now he is up 1 liter with drop in sodium.  CXR consistent with volume overload and agree with IV Lasix today.  Will repeat sodium level and if continues to drop, we may need to tx to icu.  Currently with mild nausea but otherwise asymptomatic 1. Will hold ACE-inhibitor for now as well 2. Critical AS- will need right and left heart cath and evaluation for TAVR 3. HTN- stable 4. CAP- on azithromycin per primary 5. Pulmonary edema/CHF- likely related to critical AS and very sensitive to volume.  Agree with diuresis as well as left and right heart cath.   Governor Rooks Alayja Armas 06/09/2016, 10:41 AM

## 2016-06-09 NOTE — Progress Notes (Signed)
Pt noted to have increasing labored breathing and dyspnea on exertion. Audible inspiratory and expiratory wheezes. O2 sats 91-93% on 2LTriad paged. Order received for breathing tx x1. Will continue to monitor.  Jacqlyn Larsen, RN

## 2016-06-09 NOTE — Progress Notes (Signed)
PROGRESS NOTE  Richard Simon [id number] DOB: 1930-02-11 DOA: 06/07/2016 PCP: Merrilee Seashore, MD   LOS: 2 days   Brief Narrative: Richard Simon is a 83 y.o. male with medical history significant of atrial fibrillation on Pradaxa, CAD, CABG, hyperlipidemia, hypertension, moderate to severe aortic stenosis is coming to the emergency department with complaints of on/off chest pressure, radiates to his back, improved by rest for the past week associated with dyspnea, orthopnea and fatigue.  Assessment & Plan: Principal Problem:   Hyponatremia Active Problems:   Aortic stenosis, moderate   Essential hypertension   Dyslipidemia, goal LDL below 70   Atrial fibrillation, permanent (HCC)   CAD (coronary artery disease)   Chest pain   Hyponatremia -He is not taking diuretics. He denied potomania initially however told me that he has been drinking "a lot more water" recently.  He has a history of chronic hyponatremia, states that he has strong family history of hyponatremia as well.  On chart review, his sodium has been 125-130 at least dating back since 2010.  His urine and serum osmolalities are low, placed patient on fluid restriction on 2/22.  Discussed about restricting free water with patient as well at bedside. -Sodium progressively getting worse last night as well, overnight he was given IV fluids which were discontinued this morning.  Sodium decreased to 117, he appears fluid overloaded on exam, chest x-ray confirms mild pulmonary edema, will give Lasix 1 this morning.  Nephrology consulted overnight, appreciate input given acute on chronic hyponatremia  Chest pain -2D echo done on 2/22 showed an ejection fraction of 45-50%, with mildly reduced systolic function, with diffuse hypokinesis and critical aortic valve stenosis.  Valve area was calculated at 0.26 cm.  Prior to the echo done in April 2017 showed normal EF of 0000000, normal systolic function, with a valve area of 0.55 -appreciate  cardiology consultation  Acute on probable chronic systolic CHF -Suspect related to his aortic valve stenosis, very sensitive to fluids, chest x-ray with mild fluid overload, will give Lasix this morning  CAP -Continue antibiotics today  CAD (coronary artery disease) -Continue home medications, currently without any chest pain  Aortic stenosis, moderate >> severe  -Updated 2D echo as below, cards following, now echo reads the aortic stenosis is critical  Essential hypertension -Blood pressure controlled, continue current regimen  Dyslipidemia, goal LDL below 70 - Continue Crestor  Atrial fibrillation, permanent (HCC) - CHA2DS2-VASc Score of at least 4. - Continue Pradaxa. - Continue atenolol for rate control   DVT prophylaxis: Pradaxa Code Status: Full code Family Communication: no family at bedside.  Asked patient what family member he would like me to call for communication and he stated "none" Disposition Plan: remain inpatient  Consultants:   Cardiology   Procedures:   2D echo Study Conclusions - Left ventricle: The cavity size was normal. Wall thickness was increased in a pattern of mild LVH. Systolic function was normal. The estimated ejection fraction was in the range of 55% to 60%. Wall motion was normal; there were no regional wall motion abnormalities. - Aortic valve: Valve mobility was restricted. There was severe stenosis. There was moderate regurgitation. Peak velocity (S): 451 cm/s. Mean gradient (S): 43 mm Hg. - Mitral valve: Moderately calcified annulus. There was mild regurgitation. - Left atrium: The atrium was moderately dilated. - Pulmonary arteries: Systolic pressure was mildly increased. PA peak pressure: 32 mm Hg (S).  Impressions: - Mean gradient 16mmHg from prior echocardiogram. Aortic stenosis has increased.  Antimicrobials:  Ceftriaxone  2/21 >>   azithromycin 2/21 >>  Subjective: -No chest pain, complains of mild shortness of  breath  Objective: Vitals:   06/08/16 1951 06/09/16 0011 06/09/16 0258 06/09/16 0740  BP: (!) 127/97 (!) 133/97 135/87 132/79  Pulse: 95 (!) 110 (!) 110 91  Resp: (!) 26 (!) 21 (!) 23 (!) 24  Temp: 98.5 F (36.9 C) 98.6 F (37 C) 98.8 F (37.1 C) 98 F (36.7 C)  TempSrc: Oral   Oral  SpO2: 92% 96% 93% 92%  Weight:      Height:        Intake/Output Summary (Last 24 hours) at 06/09/16 0915 Last data filed at 06/09/16 0900  Gross per 24 hour  Intake             1510 ml  Output              575 ml  Net              935 ml   Filed Weights   06/07/16 0034 06/08/16 0518  Weight: 85.7 kg (189 lb) 85.9 kg (189 lb 6.4 oz)    Examination: Constitutional: NAD Vitals:   06/08/16 1951 06/09/16 0011 06/09/16 0258 06/09/16 0740  BP: (!) 127/97 (!) 133/97 135/87 132/79  Pulse: 95 (!) 110 (!) 110 91  Resp: (!) 26 (!) 21 (!) 23 (!) 24  Temp: 98.5 F (36.9 C) 98.6 F (37 C) 98.8 F (37.1 C) 98 F (36.7 C)  TempSrc: Oral   Oral  SpO2: 92% 96% 93% 92%  Weight:      Height:       Eyes: PERRL, lids and conjunctivae normal Respiratory: Scattered wheezing, no crackles.  Cardiovascular: Irregular. No LE edema. 3/6 SEM Abdomen: no tenderness. Bowel sounds positive.  Musculoskeletal: no clubbing / cyanosis.  Neurologic: CN 2-12 grossly intact. Strength 5/5 in all 4.  Psychiatric: Normal judgment and insight. Alert and oriented x 3. Normal mood.    Data Reviewed: I have personally reviewed following labs and imaging studies  CBC:  Recent Labs Lab 06/07/16 0044 06/08/16 0236 06/09/16 0448  WBC 11.6* 12.0* 13.9*  NEUTROABS  --  10.0* 11.6*  HGB 11.8* 11.7* 10.9*  HCT 32.5* 33.1* 30.9*  MCV 91.8 92.2 91.4  PLT 187 192 [id number]   Basic Metabolic Panel:  Recent Labs Lab 06/08/16 0235 06/08/16 0656 06/08/16 1334 06/08/16 1927 06/09/16 0448  NA 121* 120* 121* 119* 117*  K 4.3 4.2 4.6 4.6 4.6  CL 88* 91* 88* 88* 87*  CO2 21* 21* 23 20* 21*  GLUCOSE 129* 128* 151* 140* 128*    BUN 9 11 14 16 17   CREATININE 0.77 0.65 0.78 0.77 0.62  CALCIUM 9.1 8.6* 8.9 9.1 8.8*   GFR: Estimated Creatinine Clearance: 74.7 mL/min (by C-G formula based on SCr of 0.62 mg/dL). Liver Function Tests: No results for input(s): AST, ALT, ALKPHOS, BILITOT, PROT, ALBUMIN in the last 168 hours. No results for input(s): LIPASE, AMYLASE in the last 168 hours. No results for input(s): AMMONIA in the last 168 hours. Coagulation Profile: No results for input(s): INR, PROTIME in the last 168 hours. Cardiac Enzymes:  Recent Labs Lab 06/07/16 0723 06/07/16 1357  TROPONINI <0.03 <0.03   BNP (last 3 results) No results for input(s): PROBNP in the last 8760 hours. HbA1C: No results for input(s): HGBA1C in the last 72 hours. CBG: No results for input(s): GLUCAP in the last 168 hours. Lipid Profile: No results for input(s): CHOL, HDL,  LDLCALC, TRIG, CHOLHDL, LDLDIRECT in the last 72 hours. Thyroid Function Tests: No results for input(s): TSH, T4TOTAL, FREET4, T3FREE, THYROIDAB in the last 72 hours. Anemia Panel: No results for input(s): VITAMINB12, FOLATE, FERRITIN, TIBC, IRON, RETICCTPCT in the last 72 hours. Urine analysis:    Component Value Date/Time   COLORURINE YELLOW 12/28/2010 0050   APPEARANCEUR CLEAR 12/28/2010 0050   LABSPEC 1.008 12/28/2010 0050   PHURINE 7.0 12/28/2010 0050   GLUCOSEU NEGATIVE 12/28/2010 0050   HGBUR NEGATIVE 12/28/2010 0050   BILIRUBINUR NEGATIVE 12/28/2010 0050   KETONESUR NEGATIVE 12/28/2010 0050   PROTEINUR NEGATIVE 12/28/2010 0050   UROBILINOGEN 0.2 12/28/2010 0050   NITRITE NEGATIVE 12/28/2010 0050   LEUKOCYTESUR NEGATIVE 12/28/2010 0050   Sepsis Labs: Invalid input(s): PROCALCITONIN, LACTICIDVEN  Recent Results (from the past 240 hour(s))  Blood culture (routine x 2)     Status: None (Preliminary result)   Collection Time: 06/07/16  2:18 AM  Result Value Ref Range Status   Specimen Description BLOOD RIGHT HAND  Final   Special Requests  BOTTLES DRAWN AEROBIC AND ANAEROBIC 5ML  Final   Culture NO GROWTH 1 DAY  Final   Report Status PENDING  Incomplete  Blood culture (routine x 2)     Status: None (Preliminary result)   Collection Time: 06/07/16  2:35 AM  Result Value Ref Range Status   Specimen Description BLOOD LEFT ARM  Final   Special Requests BOTTLES DRAWN AEROBIC AND ANAEROBIC 5ML  Final   Culture NO GROWTH 1 DAY  Final   Report Status PENDING  Incomplete  Culture, sputum-assessment     Status: None   Collection Time: 06/07/16 11:21 AM  Result Value Ref Range Status   Specimen Description SPUTUM  Final   Special Requests Immunocompromised  Final   Sputum evaluation THIS SPECIMEN IS ACCEPTABLE FOR SPUTUM CULTURE  Final   Report Status 06/07/2016 FINAL  Final  Culture, respiratory (NON-Expectorated)     Status: None (Preliminary result)   Collection Time: 06/07/16 11:21 AM  Result Value Ref Range Status   Specimen Description SPUTUM  Final   Special Requests Immunocompromised Reflexed from [id number]  Final   Gram Stain   Final    MODERATE WBC PRESENT, PREDOMINANTLY PMN FEW SQUAMOUS EPITHELIAL CELLS PRESENT RARE GRAM POSITIVE COCCOBACILLUS RARE GRAM POSITIVE COCCI IN PAIRS RARE GRAM NEGATIVE COCCOBACILLI    Culture CULTURE REINCUBATED FOR BETTER GROWTH  Final   Report Status PENDING  Incomplete  MRSA PCR Screening     Status: None   Collection Time: 06/07/16  3:05 PM  Result Value Ref Range Status   MRSA by PCR NEGATIVE NEGATIVE Final    Comment:        The GeneXpert MRSA Assay (FDA approved for NASAL specimens only), is one component of a comprehensive MRSA colonization surveillance program. It is not intended to diagnose MRSA infection nor to guide or monitor treatment for MRSA infections.       Radiology Studies: Dg Chest Port 1 View  Result Date: 06/09/2016 CLINICAL DATA:  Shortness of Breath EXAM: PORTABLE CHEST 1 VIEW COMPARISON:  June 07, 2016 and December 27, 2010 FINDINGS: There is  diffuse interstitial prominence, primarily in the upper lobes superimposed on chronic interstitial thickening. There is patchy airspace opacity in the upper lobes and left mid lung region superimposed on interstitial edema. There is cardiomegaly with pulmonary venous hypertension. There are small pleural effusions bilaterally. There is atherosclerotic calcification in the aorta. Patient is status post coronary artery bypass  grafting. No bone lesions. No adenopathy. IMPRESSION: Findings felt to represent congestive heart failure superimposed on chronic interstitial thickening. Relative airspace opacity in portions of the upper lobes likely represents alveolar edema. A degree of superimposed pneumonia in the upper lobes cannot be excluded radiographically. There is aortic atherosclerosis. Appearance is essentially stable compared to 1 day prior. Electronically Signed   By: Lowella Grip III M.D.   On: 06/09/2016 08:04     Scheduled Meds: . aspirin EC  81 mg Oral Daily  . atenolol  50 mg Oral Daily  . azithromycin  500 mg Intravenous Q24H  . cefTRIAXone (ROCEPHIN)  IV  1 g Intravenous Q24H  . dabigatran  150 mg Oral BID  . diltiazem  240 mg Oral Daily  . furosemide  40 mg Intravenous Once  . lisinopril  10 mg Oral QHS  . pneumococcal 23 valent vaccine  0.5 mL Intramuscular Tomorrow-1000  . rosuvastatin  10 mg Oral Daily  . sodium chloride flush  3 mL Intravenous Q12H   Continuous Infusions:   Marzetta Board, MD, PhD [facility] Pager [phone]  If 7PM-7AM, please contact night-coverage [email or web address] Password [email or web address] 06/09/2016, 9:15 AM

## 2016-06-09 NOTE — Progress Notes (Signed)
Progress Note  Patient Name: Moriah Huffines Date of Encounter: 06/09/2016  Primary Cardiologist: Ellyn Hack   Subjective   83 yo male with PMH of CAD s/p CABG. Mod-Severe AS, per AFib, HTN, and HL who presented with chest pain, and dyspnea  Inpatient Medications    Scheduled Meds: . aspirin EC  81 mg Oral Daily  . atenolol  50 mg Oral Daily  . azithromycin  500 mg Intravenous Q24H  . cefTRIAXone (ROCEPHIN)  IV  1 g Intravenous Q24H  . dabigatran  150 mg Oral BID  . diltiazem  240 mg Oral Daily  . furosemide  40 mg Intravenous Once  . lisinopril  10 mg Oral QHS  . pneumococcal 23 valent vaccine  0.5 mL Intramuscular Tomorrow-1000  . rosuvastatin  10 mg Oral Daily  . sodium chloride flush  3 mL Intravenous Q12H   Continuous Infusions:  PRN Meds: acetaminophen, HYDROcodone-acetaminophen, ondansetron **OR** ondansetron (ZOFRAN) IV   Vital Signs    Vitals:   06/08/16 1951 06/09/16 0011 06/09/16 0258 06/09/16 0740  BP: (!) 127/97 (!) 133/97 135/87 132/79  Pulse: 95 (!) 110 (!) 110 91  Resp: (!) 26 (!) 21 (!) 23 (!) 24  Temp: 98.5 F (36.9 C) 98.6 F (37 C) 98.8 F (37.1 C) 98 F (36.7 C)  TempSrc: Oral   Oral  SpO2: 92% 96% 93% 92%  Weight:      Height:        Intake/Output Summary (Last 24 hours) at 06/09/16 1033 Last data filed at 06/09/16 0900  Gross per 24 hour  Intake             1510 ml  Output              575 ml  Net              935 ml   Filed Weights   06/07/16 0034 06/08/16 0518  Weight: 189 lb (85.7 kg) 189 lb 6.4 oz (85.9 kg)    Telemetry    Irreg. Irreg.  - Personally Reviewed  ECG      Physical Exam   GEN: No acute distress.   Neck: No JVD Cardiac: Irreg. Irreg. , [id number] systlic murmur  Respiratory:  faint wheezing  GI: Soft, nontender, non-distended  MS: No edema; No deformity. Neuro:  Nonfocal  Psych: Normal affect   Labs    Chemistry Recent Labs Lab 06/08/16 1334 06/08/16 1927 06/09/16 0448  NA 121* 119* 117*  K 4.6 4.6  4.6  CL 88* 88* 87*  CO2 23 20* 21*  GLUCOSE 151* 140* 128*  BUN 14 16 17   CREATININE 0.78 0.77 0.62  CALCIUM 8.9 9.1 8.8*  GFRNONAA >60 >60 >60  GFRAA >60 >60 >60  ANIONGAP 10 11 9      Hematology Recent Labs Lab 06/07/16 0044 06/08/16 0236 06/09/16 0448  WBC 11.6* 12.0* 13.9*  RBC 3.54* 3.59* 3.38*  HGB 11.8* 11.7* 10.9*  HCT 32.5* 33.1* 30.9*  MCV 91.8 92.2 91.4  MCH 33.3 32.6 32.2  MCHC 36.3* 35.3 35.3  RDW 12.0 12.0 12.1  PLT 187 192 214    Cardiac Enzymes Recent Labs Lab 06/07/16 0723 06/07/16 1357  TROPONINI <0.03 <0.03    Recent Labs Lab 06/07/16 0049  TROPIPOC 0.01     BNP Recent Labs Lab 06/07/16 0044  BNP 277.1*     DDimer No results for input(s): DDIMER in the last 168 hours.   Radiology    Dg Chest [facility]  1 View  Result Date: 06/09/2016 CLINICAL DATA:  Shortness of Breath EXAM: PORTABLE CHEST 1 VIEW COMPARISON:  June 07, 2016 and December 27, 2010 FINDINGS: There is diffuse interstitial prominence, primarily in the upper lobes superimposed on chronic interstitial thickening. There is patchy airspace opacity in the upper lobes and left mid lung region superimposed on interstitial edema. There is cardiomegaly with pulmonary venous hypertension. There are small pleural effusions bilaterally. There is atherosclerotic calcification in the aorta. Patient is status post coronary artery bypass grafting. No bone lesions. No adenopathy. IMPRESSION: Findings felt to represent congestive heart failure superimposed on chronic interstitial thickening. Relative airspace opacity in portions of the upper lobes likely represents alveolar edema. A degree of superimposed pneumonia in the upper lobes cannot be excluded radiographically. There is aortic atherosclerosis. Appearance is essentially stable compared to 1 day prior. Electronically Signed   By: Lowella Grip III M.D.   On: 06/09/2016 08:04    Cardiac Studies      Patient Profile     83 yo male  with PMH of CAD s/p CABG. Mod-Severe AS, per AFib, HTN, and HL who presented with chest pain, and dyspnea  Assessment & Plan    1.  Aortic stenosis:   Echo from yesterday shows critical AS. This is likely the cause of his symptoms. Will schedule him for R and L heart cath for Monday  He would be a candidate for TAVR - likely not a candidate for standard AVR  2. Atrial fib:   Holding pradaxa after tonights dose.  3. HTN:   Well controlled.   4. CAD - s/p CABG  For cath on Monday    Signed, Mertie Moores, MD  06/09/2016, 10:33 AM

## 2016-06-09 NOTE — Progress Notes (Signed)
PT Cancellation Note  Patient Details Name: Mauel Corgan MRN: [id number] DOB: 16-Dec-1929   Cancelled Treatment:    Reason Eval/Treat Not Completed: Medical issues which prohibited therapy. Low Na+ 117. Nephrology considering xfer to ICU if levels continued to drop. Will hold PT on today and check back another day.    Weston Anna, MPT Pager: [phone]

## 2016-06-10 DIAGNOSIS — I10 Essential (primary) hypertension: Secondary | ICD-10-CM

## 2016-06-10 DIAGNOSIS — R0600 Dyspnea, unspecified: Secondary | ICD-10-CM

## 2016-06-10 DIAGNOSIS — E785 Hyperlipidemia, unspecified: Secondary | ICD-10-CM

## 2016-06-10 DIAGNOSIS — R0602 Shortness of breath: Secondary | ICD-10-CM

## 2016-06-10 DIAGNOSIS — G9340 Encephalopathy, unspecified: Secondary | ICD-10-CM

## 2016-06-10 DIAGNOSIS — E871 Hypo-osmolality and hyponatremia: Secondary | ICD-10-CM

## 2016-06-10 LAB — BASIC METABOLIC PANEL
Anion gap: 10 (ref 5–15)
Anion gap: 7 (ref 5–15)
BUN: 17 mg/dL (ref 6–20)
BUN: 17 mg/dL (ref 6–20)
CO2: 22 mmol/L (ref 22–32)
CO2: 26 mmol/L (ref 22–32)
Calcium: 8.8 mg/dL — ABNORMAL LOW (ref 8.9–10.3)
Calcium: 8.9 mg/dL (ref 8.9–10.3)
Chloride: 89 mmol/L — ABNORMAL LOW (ref 101–111)
Chloride: 89 mmol/L — ABNORMAL LOW (ref 101–111)
Creatinine, Ser: 0.68 mg/dL (ref 0.61–1.24)
Creatinine, Ser: 0.71 mg/dL (ref 0.61–1.24)
GFR calc Af Amer: >60 mL/min (ref >60)
GFR calc Af Amer: >60 mL/min (ref >60)
GFR calc non Af Amer: >60 mL/min (ref >60)
GFR calc non Af Amer: >60 mL/min (ref >60)
Glucose, Bld: 119 mg/dL — ABNORMAL HIGH (ref 65–99)
Glucose, Bld: 131 mg/dL — ABNORMAL HIGH (ref 65–99)
Potassium: 4.3 mmol/L (ref 3.5–5.1)
Potassium: 4.2 mmol/L (ref 3.5–5.1)
Sodium: 121 mmol/L — ABNORMAL LOW (ref 135–145)
Sodium: 122 mmol/L — ABNORMAL LOW (ref 135–145)

## 2016-06-10 LAB — CBC WITH DIFFERENTIAL/PLATELET
BASOS ABS: 0 10*3/uL (ref 0.0–0.1)
Basophils Relative: 0 %
EOS ABS: 0 10*3/uL (ref 0.0–0.7)
Eosinophils Relative: 0 %
HCT: 28.3 % — ABNORMAL LOW (ref 39.0–52.0)
HEMOGLOBIN: 10.3 g/dL — AB (ref 13.0–17.0)
LYMPHS ABS: 0.9 10*3/uL (ref 0.7–4.0)
Lymphocytes Relative: 6 %
MCH: 33 pg (ref 26.0–34.0)
MCHC: 36.4 g/dL — ABNORMAL HIGH (ref 30.0–36.0)
MCV: 90.7 fL (ref 78.0–100.0)
MONO ABS: 0.9 10*3/uL (ref 0.1–1.0)
Monocytes Relative: 6 %
NEUTROS PCT: 88 %
Neutro Abs: 13.5 10*3/uL — ABNORMAL HIGH (ref 1.7–7.7)
PLATELETS: 246 10*3/uL (ref 150–400)
RBC: 3.12 MIL/uL — AB (ref 4.22–5.81)
RDW: 12.4 % (ref 11.5–15.5)
WBC: 15.3 10*3/uL — AB (ref 4.0–10.5)

## 2016-06-10 MED ORDER — ALPRAZOLAM 0.25 MG PO TABS
0.2500 mg | ORAL_TABLET | Freq: Every evening | ORAL | Status: DC | PRN
  Administered 2016-06-10 – 2016-06-23 (×12): 0.25 mg via ORAL
  Filled 2016-06-10 (×12): qty 1

## 2016-06-10 MED ORDER — LEVALBUTEROL HCL 0.63 MG/3ML IN NEBU
0.6300 mg | INHALATION_SOLUTION | Freq: Four times a day (QID) | RESPIRATORY_TRACT | Status: DC | PRN
  Administered 2016-06-10 – 2016-06-11 (×3): 0.63 mg via RESPIRATORY_TRACT
  Filled 2016-06-10 (×4): qty 3

## 2016-06-10 MED ORDER — FUROSEMIDE 10 MG/ML IJ SOLN
40.0000 mg | Freq: Two times a day (BID) | INTRAMUSCULAR | Status: AC
  Administered 2016-06-10 – 2016-06-11 (×4): 40 mg via INTRAVENOUS
  Filled 2016-06-10 (×4): qty 4

## 2016-06-10 MED ORDER — GUAIFENESIN-DM 100-10 MG/5ML PO SYRP
5.0000 mL | ORAL_SOLUTION | ORAL | Status: DC | PRN
  Administered 2016-06-10 – 2016-06-15 (×5): 5 mL via ORAL
  Filled 2016-06-10 (×5): qty 5

## 2016-06-10 MED ORDER — LEVALBUTEROL HCL 1.25 MG/0.5ML IN NEBU
1.2500 mg | INHALATION_SOLUTION | Freq: Once | RESPIRATORY_TRACT | Status: AC
  Administered 2016-06-10: 1.25 mg via RESPIRATORY_TRACT
  Filled 2016-06-10: qty 0.5

## 2016-06-10 NOTE — Progress Notes (Signed)
PROGRESS NOTE  Richard Simon [id number] DOB: [birth year] DOA: 06/07/2016 PCP: Merrilee Seashore, MD   LOS: 3 days   Brief Narrative: Richard Simon is a 83 y.o. male with medical history significant of atrial fibrillation on Pradaxa, CAD, CABG, hyperlipidemia, hypertension, moderate to severe aortic stenosis is coming to the emergency department with complaints of on/off chest pressure, radiates to his back, improved by rest for the past week associated with dyspnea, orthopnea and fatigue.  Assessment & Plan: Principal Problem:   Hyponatremia Active Problems:   Aortic stenosis, moderate   Essential hypertension   Dyslipidemia, goal LDL below 70   Atrial fibrillation, permanent (HCC)   CAD (coronary artery disease)   Chest pain   Hyponatremia -He is not taking diuretics. He denied potomania initially however told me that he has been drinking "a lot more water" recently.  He has a history of chronic hyponatremia, states that he has strong family history of hyponatremia as well.  On chart review, his sodium has been 125-130 at least dating back since 2010.   -Patient was initially hypovolemic and his sodium improved with gentle hydration, however he developed fluid overload rather rapidly due to his critical aortic stenosis, with worsening hyponatremia.  He required IV Lasix, with improvement in his respiratory status as well as improvement in his sodium levels.  His fluid status is quite tenuous given critical AS  Chest pain -2D echo done on 2/22 showed an ejection fraction of 45-50%, with mildly reduced systolic function, with diffuse hypokinesis and critical aortic valve stenosis.  Valve area was calculated at 0.26 cm.  Prior to the echo done in April 2017 showed normal EF of 0000000, normal systolic function, with a valve area of 0.55 -appreciate cardiology consultation, it appears that he will undergo left and right cardiac catheterization on Monday, and may need TAVR  Acute on probable  chronic systolic CHF -Suspect related to his aortic valve stenosis, very sensitive to fluids, chest x-ray with mild fluid overload, will continue with Lasix today  CAP -Continue antibiotics today, patient with increased white count, continue to closely monitor, continue antibiotics  Acute encephalopathy -Patient with intermittent confusion, this is likely in the setting of infectious process, as well as probable delirium related to inpatient stay  CAD (coronary artery disease) -Continue home medications, cardiology evaluating, he will undergo cath on Monday  Aortic stenosis, moderate >> severe  -Updated 2D echo as below, cards following, now echo reads the aortic stenosis is critical  Essential hypertension -Blood pressure controlled, continue current regimen  Dyslipidemia, goal LDL below 70 - Continue Crestor  Atrial fibrillation, permanent (HCC) - CHA2DS2-VASc Score of at least 4. - Continue atenolol for rate control, his Pradaxa is now on hold per cardiology given preparation for cath on Monday   DVT prophylaxis: SCDs Code Status: Full code Family Communication: no family at bedside.  Asked patient what family member he would like me to call for communication and he stated "none" Disposition Plan: remain inpatient  Consultants:   Cardiology  Nephrology  Procedures:   2D echo Study Conclusions - Left ventricle: The cavity size was normal. Wall thickness was increased in a pattern of mild LVH. Systolic function was normal. The estimated ejection fraction was in the range of 55% to 60%. Wall motion was normal; there were no regional wall motion abnormalities. - Aortic valve: Valve mobility was restricted. There was severe stenosis. There was moderate regurgitation. Peak velocity (S): 451 cm/s. Mean gradient (S): 43 mm Hg. - Mitral valve: Moderately  calcified annulus. There was mild regurgitation. - Left atrium: The atrium was moderately dilated. - Pulmonary arteries:  Systolic pressure was mildly increased. PA peak pressure: 32 mm Hg (S).  Impressions: - Mean gradient 22mmHg from prior echocardiogram. Aortic stenosis has increased.  Antimicrobials:  Ceftriaxone 2/21 >>   azithromycin 2/21 >>  Subjective: -Confused this morning, trying to climb out of bed, he feels "wiped out".  Denies any chest pain.  Objective: Vitals:   06/10/16 0734 06/10/16 0800 06/10/16 0804 06/10/16 0941  BP: 136/84 120/84 121/85 112/80  Pulse: (!) 107 91 81 84  Resp:   (!) 28   Temp:      TempSrc:      SpO2:   96%   Weight:      Height:        Intake/Output Summary (Last 24 hours) at 06/10/16 0946 Last data filed at 06/10/16 0900  Gross per 24 hour  Intake              478 ml  Output             1050 ml  Net             -572 ml   Filed Weights   06/07/16 0034 06/08/16 0518 06/10/16 0532  Weight: 85.7 kg (189 lb) 85.9 kg (189 lb 6.4 oz) 86.2 kg (190 lb)    Examination: Constitutional: NAD Vitals:   06/10/16 0734 06/10/16 0800 06/10/16 0804 06/10/16 0941  BP: 136/84 120/84 121/85 112/80  Pulse: (!) 107 91 81 84  Resp:   (!) 28   Temp:      TempSrc:      SpO2:   96%   Weight:      Height:       Eyes: PERRL, lids and conjunctivae normal Respiratory: Scattered wheezing, no crackles.  Cardiovascular: Irregular. No LE edema. 3/6 SEM Abdomen: no tenderness. Bowel sounds positive.  Musculoskeletal: no clubbing / cyanosis.  Neurologic: CN 2-12 grossly intact. Strength 5/5 in all 4.  Psychiatric: Confused.  Alert and oriented x 2.  Appears anxious   Data Reviewed: I have personally reviewed following labs and imaging studies  CBC:  Recent Labs Lab 06/07/16 0044 06/08/16 0236 06/09/16 0448 06/10/16 0447  WBC 11.6* 12.0* 13.9* 15.3*  NEUTROABS  --  10.0* 11.6* 13.5*  HGB 11.8* 11.7* 10.9* 10.3*  HCT 32.5* 33.1* 30.9* 28.3*  MCV 91.8 92.2 91.4 90.7  PLT 187 192 214 0000000   Basic Metabolic Panel:  Recent Labs Lab 06/08/16 1927 06/09/16 0448  06/09/16 1249 06/09/16 1709 06/10/16 0447  NA 119* 117* 118* 120* 121*  K 4.6 4.6 4.1 4.5 4.3  CL 88* 87* 87* 86* 89*  CO2 20* 21* 20* 23 22  GLUCOSE 140* 128* 180* 139* 119*  BUN 16 17 17 17 17   CREATININE 0.77 0.62 0.70 0.78 0.68  CALCIUM 9.1 8.8* 8.8* 9.2 8.8*   GFR: Estimated Creatinine Clearance: 74.8 mL/min (by C-G formula based on SCr of 0.68 mg/dL). Liver Function Tests: No results for input(s): AST, ALT, ALKPHOS, BILITOT, PROT, ALBUMIN in the last 168 hours. No results for input(s): LIPASE, AMYLASE in the last 168 hours. No results for input(s): AMMONIA in the last 168 hours. Coagulation Profile: No results for input(s): INR, PROTIME in the last 168 hours. Cardiac Enzymes:  Recent Labs Lab 06/07/16 0723 06/07/16 1357  TROPONINI <0.03 <0.03   BNP (last 3 results) No results for input(s): PROBNP in the last 8760 hours. HbA1C:  No results for input(s): HGBA1C in the last 72 hours. CBG: No results for input(s): GLUCAP in the last 168 hours. Lipid Profile: No results for input(s): CHOL, HDL, LDLCALC, TRIG, CHOLHDL, LDLDIRECT in the last 72 hours. Thyroid Function Tests:  Recent Labs  06/09/16 1324  TSH 1.319   Anemia Panel: No results for input(s): VITAMINB12, FOLATE, FERRITIN, TIBC, IRON, RETICCTPCT in the last 72 hours. Urine analysis:    Component Value Date/Time   COLORURINE YELLOW 12/28/2010 0050   APPEARANCEUR CLEAR 12/28/2010 0050   LABSPEC 1.008 12/28/2010 0050   PHURINE 7.0 12/28/2010 0050   GLUCOSEU NEGATIVE 12/28/2010 0050   HGBUR NEGATIVE 12/28/2010 0050   BILIRUBINUR NEGATIVE 12/28/2010 0050   KETONESUR NEGATIVE 12/28/2010 0050   PROTEINUR NEGATIVE 12/28/2010 0050   UROBILINOGEN 0.2 12/28/2010 0050   NITRITE NEGATIVE 12/28/2010 0050   LEUKOCYTESUR NEGATIVE 12/28/2010 0050   Sepsis Labs: Invalid input(s): PROCALCITONIN, LACTICIDVEN  Recent Results (from the past 240 hour(s))  Blood culture (routine x 2)     Status: None (Preliminary  result)   Collection Time: 06/07/16  2:18 AM  Result Value Ref Range Status   Specimen Description BLOOD RIGHT HAND  Final   Special Requests BOTTLES DRAWN AEROBIC AND ANAEROBIC 5ML  Final   Culture NO GROWTH 2 DAYS  Final   Report Status PENDING  Incomplete  Blood culture (routine x 2)     Status: None (Preliminary result)   Collection Time: 06/07/16  2:35 AM  Result Value Ref Range Status   Specimen Description BLOOD LEFT ARM  Final   Special Requests BOTTLES DRAWN AEROBIC AND ANAEROBIC 5ML  Final   Culture NO GROWTH 2 DAYS  Final   Report Status PENDING  Incomplete  Culture, sputum-assessment     Status: None   Collection Time: 06/07/16 11:21 AM  Result Value Ref Range Status   Specimen Description SPUTUM  Final   Special Requests Immunocompromised  Final   Sputum evaluation THIS SPECIMEN IS ACCEPTABLE FOR SPUTUM CULTURE  Final   Report Status 06/07/2016 FINAL  Final  Culture, respiratory (NON-Expectorated)     Status: None   Collection Time: 06/07/16 11:21 AM  Result Value Ref Range Status   Specimen Description SPUTUM  Final   Special Requests Immunocompromised Reflexed from [id number]  Final   Gram Stain   Final    MODERATE WBC PRESENT, PREDOMINANTLY PMN FEW SQUAMOUS EPITHELIAL CELLS PRESENT RARE GRAM POSITIVE COCCOBACILLUS RARE GRAM POSITIVE COCCI IN PAIRS RARE GRAM NEGATIVE COCCOBACILLI    Culture Consistent with normal respiratory flora.  Final   Report Status 06/09/2016 FINAL  Final  MRSA PCR Screening     Status: None   Collection Time: 06/07/16  3:05 PM  Result Value Ref Range Status   MRSA by PCR NEGATIVE NEGATIVE Final    Comment:        The GeneXpert MRSA Assay (FDA approved for NASAL specimens only), is one component of a comprehensive MRSA colonization surveillance program. It is not intended to diagnose MRSA infection nor to guide or monitor treatment for MRSA infections.       Radiology Studies: Dg Chest Port 1 View  Result Date:  06/09/2016 CLINICAL DATA:  Shortness of breath. EXAM: PORTABLE CHEST 1 VIEW COMPARISON:  06/09/2016 at 7:35 a.m. FINDINGS: Sternotomy wires unchanged. Lungs are adequately inflated with continued airspace consolidation most prominent over the upper lobes right worse than left which is slightly worse over the right mid to upper lung. Minimal patchy bibasilar opacification unchanged. Possible  small amount of bilateral pleural fluid unchanged. Mild stable cardiomegaly. Calcified plaque over the aortic arch. Remainder of the exam is unchanged. IMPRESSION: Multifocal airspace process worse over the upper lobes with possible slight interval worsening over the right upper lobe. Findings are likely due to infection. Possible small amount of bilateral pleural fluid. Mild stable cardiomegaly.  Aortic atherosclerosis. Electronically Signed   By: Marin Olp M.D.   On: 06/09/2016 21:05   Dg Chest Port 1 View  Result Date: 06/09/2016 CLINICAL DATA:  Shortness of Breath EXAM: PORTABLE CHEST 1 VIEW COMPARISON:  June 07, 2016 and December 27, 2010 FINDINGS: There is diffuse interstitial prominence, primarily in the upper lobes superimposed on chronic interstitial thickening. There is patchy airspace opacity in the upper lobes and left mid lung region superimposed on interstitial edema. There is cardiomegaly with pulmonary venous hypertension. There are small pleural effusions bilaterally. There is atherosclerotic calcification in the aorta. Patient is status post coronary artery bypass grafting. No bone lesions. No adenopathy. IMPRESSION: Findings felt to represent congestive heart failure superimposed on chronic interstitial thickening. Relative airspace opacity in portions of the upper lobes likely represents alveolar edema. A degree of superimposed pneumonia in the upper lobes cannot be excluded radiographically. There is aortic atherosclerosis. Appearance is essentially stable compared to 1 day prior. Electronically  Signed   By: Lowella Grip III M.D.   On: 06/09/2016 08:04     Scheduled Meds: . aspirin EC  81 mg Oral Daily  . atenolol  50 mg Oral Daily  . azithromycin  500 mg Intravenous Q24H  . cefTRIAXone (ROCEPHIN)  IV  1 g Intravenous Q24H  . diltiazem  240 mg Oral Daily  . furosemide  40 mg Intravenous BID  . rosuvastatin  10 mg Oral Daily  . sodium chloride flush  3 mL Intravenous Q12H   Continuous Infusions:   Marzetta Board, MD, PhD [facility] Pager [phone]  If 7PM-7AM, please contact night-coverage [email or web address] Password [email or web address] 06/10/2016, 9:46 AM

## 2016-06-10 NOTE — Progress Notes (Signed)
Progress Note  Patient Name: Richard Simon Date of Encounter: 06/10/2016  Primary Cardiologist: Ellyn Hack   Subjective   83 yo male with PMH of CAD s/p CABG. Mod-Severe AS, per AFib, HTN, and HL who presented with chest pain, and dyspnea; FU echo shows severe AS; being evaluated for possible TAVR; also being evaluated for chronic hyponatremia  Mildly lethargic; denies CP or dyspnea  Inpatient Medications    Scheduled Meds: . aspirin EC  81 mg Oral Daily  . atenolol  50 mg Oral Daily  . azithromycin  500 mg Intravenous Q24H  . cefTRIAXone (ROCEPHIN)  IV  1 g Intravenous Q24H  . diltiazem  240 mg Oral Daily  . furosemide  40 mg Intravenous BID  . rosuvastatin  10 mg Oral Daily  . sodium chloride flush  3 mL Intravenous Q12H   Continuous Infusions:  PRN Meds: acetaminophen, ALPRAZolam, HYDROcodone-acetaminophen, levalbuterol, ondansetron **OR** ondansetron (ZOFRAN) IV   Vital Signs    Vitals:   06/10/16 0728 06/10/16 0734 06/10/16 0800 06/10/16 0804  BP: 136/84 136/84 120/84 121/85  Pulse: (!) 112 (!) 107 91 81  Resp: (!) 26   (!) 28  Temp: 98.1 F (36.7 C)     TempSrc: Axillary     SpO2: 94%   96%  Weight:      Height:        Intake/Output Summary (Last 24 hours) at 06/10/16 0929 Last data filed at 06/10/16 0900  Gross per 24 hour  Intake              478 ml  Output             1050 ml  Net             -572 ml   Filed Weights   06/07/16 0034 06/08/16 0518 06/10/16 0532  Weight: 189 lb (85.7 kg) 189 lb 6.4 oz (85.9 kg) 190 lb (86.2 kg)    Telemetry    Atrial fibrillation; rate controlled - Personally Reviewed  Physical Exam   GEN: No acute distress.   Neck: supple Cardiac: Irreg. Irreg. , 3/6 systlic murmur  Respiratory:  Diffuse wheeze GI: Soft, nontender, non-distended  MS: No edema; No deformity. Neuro:  Nonfocal  Psych: Mildly lethargic  Labs    Chemistry  Recent Labs Lab 06/09/16 1249 06/09/16 1709 06/10/16 0447  NA 118* 120* 121*  K  4.1 4.5 4.3  CL 87* 86* 89*  CO2 20* 23 22  GLUCOSE 180* 139* 119*  BUN 17 17 17   CREATININE 0.70 0.78 0.68  CALCIUM 8.8* 9.2 8.8*  GFRNONAA >60 >60 >60  GFRAA >60 >60 >60  ANIONGAP 11 11 10      Hematology  Recent Labs Lab 06/08/16 0236 06/09/16 0448 06/10/16 0447  WBC 12.0* 13.9* 15.3*  RBC 3.59* 3.38* 3.12*  HGB 11.7* 10.9* 10.3*  HCT 33.1* 30.9* 28.3*  MCV 92.2 91.4 90.7  MCH 32.6 32.2 33.0  MCHC 35.3 35.3 36.4*  RDW 12.0 12.1 12.4  PLT 192 214 246    Cardiac Enzymes  Recent Labs Lab 06/07/16 0723 06/07/16 1357  TROPONINI <0.03 <0.03     Recent Labs Lab 06/07/16 0049  TROPIPOC 0.01     BNP  Recent Labs Lab 06/07/16 0044  BNP 277.1*       Radiology    Dg Chest Port 1 View  Result Date: 06/09/2016 CLINICAL DATA:  Shortness of breath. EXAM: PORTABLE CHEST 1 VIEW COMPARISON:  06/09/2016 at 7:35 a.m. FINDINGS:  Sternotomy wires unchanged. Lungs are adequately inflated with continued airspace consolidation most prominent over the upper lobes right worse than left which is slightly worse over the right mid to upper lung. Minimal patchy bibasilar opacification unchanged. Possible small amount of bilateral pleural fluid unchanged. Mild stable cardiomegaly. Calcified plaque over the aortic arch. Remainder of the exam is unchanged. IMPRESSION: Multifocal airspace process worse over the upper lobes with possible slight interval worsening over the right upper lobe. Findings are likely due to infection. Possible small amount of bilateral pleural fluid. Mild stable cardiomegaly.  Aortic atherosclerosis. Electronically Signed   By: Marin Olp M.D.   On: 06/09/2016 21:05   Dg Chest Port 1 View  Result Date: 06/09/2016 CLINICAL DATA:  Shortness of Breath EXAM: PORTABLE CHEST 1 VIEW COMPARISON:  June 07, 2016 and December 27, 2010 FINDINGS: There is diffuse interstitial prominence, primarily in the upper lobes superimposed on chronic interstitial thickening.  There is patchy airspace opacity in the upper lobes and left mid lung region superimposed on interstitial edema. There is cardiomegaly with pulmonary venous hypertension. There are small pleural effusions bilaterally. There is atherosclerotic calcification in the aorta. Patient is status post coronary artery bypass grafting. No bone lesions. No adenopathy. IMPRESSION: Findings felt to represent congestive heart failure superimposed on chronic interstitial thickening. Relative airspace opacity in portions of the upper lobes likely represents alveolar edema. A degree of superimposed pneumonia in the upper lobes cannot be excluded radiographically. There is aortic atherosclerosis. Appearance is essentially stable compared to 1 day prior. Electronically Signed   By: Lowella Grip III M.D.   On: 06/09/2016 08:04    Patient Profile     83 yo male with PMH of CAD s/p CABG. Mod-Severe AS, per AFib, HTN, and HL who presented with chest pain, and dyspnea  Assessment & Plan    1.  Aortic stenosis:   Echo shows critical AS. This is likely the cause of his symptoms. Plan R and L heart cath for Monday  He would be a candidate for TAVR - likely not a candidate for standard AVR  2. Atrial fib:   Holding pradaxa prior to cath.  3. HTN:   Controlled.   4. CAD - s/p CABG   5. Hyponatremia-chronic; Nephrology evaluating; continue present dose of lasix for mild volume excess but would likely hold in AM prior to cath; follow renal function.   Signed, Kirk Ruths, MD  06/10/2016, 9:29 AM

## 2016-06-10 NOTE — Plan of Care (Signed)
Problem: Safety: Goal: Ability to remain free from injury will improve Outcome: Progressing Bed Alarm set for periodic confusion and anxiety. Q 1 hour rounding.

## 2016-06-10 NOTE — Progress Notes (Signed)
Patient ID: Zaedyn Kocourek, male   DOB: 02/11/1930, 83 y.o.   MRN: [id number] S:had respiratory distress last night started on Bipap, sitting in chair wearing bipap O:BP 120/84 (BP Location: Left Arm)   Pulse 91   Temp 98.1 F (36.7 C) (Axillary)   Resp (!) 26   Ht 5\' 9"  (1.753 m)   Wt 86.2 kg (190 lb)   SpO2 94%   BMI 28.06 kg/m   Intake/Output Summary (Last 24 hours) at 06/10/16 0846 Last data filed at 06/10/16 0743  Gross per 24 hour  Intake              440 ml  Output             1050 ml  Net             -610 ml   Intake/Output: I/O last 3 completed shifts: In: [id number] [P.O.:690; I.V.:380; IV Piggyback:300] Out: 1400 [Urine:1400]  Intake/Output this shift:  Total I/O In: 20 [P.O.:20] Out: -  Weight change:  Gen:WD obese WM wearing Bipap CVS:no rub Resp:scattered rhonchi [id number], NT Ext:1+ edema   Recent Labs Lab 06/08/16 0656 06/08/16 1334 06/08/16 1927 06/09/16 0448 06/09/16 1249 06/09/16 1709 06/10/16 0447  NA 120* 121* 119* 117* 118* 120* 121*  K 4.2 4.6 4.6 4.6 4.1 4.5 4.3  CL 91* 88* 88* 87* 87* 86* 89*  CO2 21* 23 20* 21* 20* 23 22  GLUCOSE 128* 151* 140* 128* 180* 139* 119*  BUN 11 14 16 17 17 17 17   CREATININE 0.65 0.78 0.77 0.62 0.70 0.78 0.68  CALCIUM 8.6* 8.9 9.1 8.8* 8.8* 9.2 8.8*   Liver Function Tests: No results for input(s): AST, ALT, ALKPHOS, BILITOT, PROT, ALBUMIN in the last 168 hours. No results for input(s): LIPASE, AMYLASE in the last 168 hours. No results for input(s): AMMONIA in the last 168 hours. CBC:  Recent Labs Lab 06/07/16 0044 06/08/16 0236 06/09/16 0448 06/10/16 0447  WBC 11.6* 12.0* 13.9* 15.3*  NEUTROABS  --  10.0* 11.6* 13.5*  HGB 11.8* 11.7* 10.9* 10.3*  HCT 32.5* 33.1* 30.9* 28.3*  MCV 91.8 92.2 91.4 90.7  PLT 187 192 214 246   Cardiac Enzymes:  Recent Labs Lab 06/07/16 0723 06/07/16 1357  TROPONINI <0.03 <0.03   CBG: No results for input(s): GLUCAP in the last 168 hours.  Iron Studies: No results  for input(s): IRON, TIBC, TRANSFERRIN, FERRITIN in the last 72 hours. Studies/Results: Dg Chest Port 1 View  Result Date: 06/09/2016 CLINICAL DATA:  Shortness of breath. EXAM: PORTABLE CHEST 1 VIEW COMPARISON:  06/09/2016 at 7:35 a.m. FINDINGS: Sternotomy wires unchanged. Lungs are adequately inflated with continued airspace consolidation most prominent over the upper lobes right worse than left which is slightly worse over the right mid to upper lung. Minimal patchy bibasilar opacification unchanged. Possible small amount of bilateral pleural fluid unchanged. Mild stable cardiomegaly. Calcified plaque over the aortic arch. Remainder of the exam is unchanged. IMPRESSION: Multifocal airspace process worse over the upper lobes with possible slight interval worsening over the right upper lobe. Findings are likely due to infection. Possible small amount of bilateral pleural fluid. Mild stable cardiomegaly.  Aortic atherosclerosis. Electronically Signed   By: Marin Olp M.D.   On: 06/09/2016 21:05   Dg Chest Port 1 View  Result Date: 06/09/2016 CLINICAL DATA:  Shortness of Breath EXAM: PORTABLE CHEST 1 VIEW COMPARISON:  June 07, 2016 and December 27, 2010 FINDINGS: There is diffuse interstitial prominence, primarily in the upper  lobes superimposed on chronic interstitial thickening. There is patchy airspace opacity in the upper lobes and left mid lung region superimposed on interstitial edema. There is cardiomegaly with pulmonary venous hypertension. There are small pleural effusions bilaterally. There is atherosclerotic calcification in the aorta. Patient is status post coronary artery bypass grafting. No bone lesions. No adenopathy. IMPRESSION: Findings felt to represent congestive heart failure superimposed on chronic interstitial thickening. Relative airspace opacity in portions of the upper lobes likely represents alveolar edema. A degree of superimposed pneumonia in the upper lobes cannot be  excluded radiographically. There is aortic atherosclerosis. Appearance is essentially stable compared to 1 day prior. Electronically Signed   By: Lowella Grip III M.D.   On: 06/09/2016 08:04   . aspirin EC  81 mg Oral Daily  . atenolol  50 mg Oral Daily  . azithromycin  500 mg Intravenous Q24H  . cefTRIAXone (ROCEPHIN)  IV  1 g Intravenous Q24H  . diltiazem  240 mg Oral Daily  . rosuvastatin  10 mg Oral Daily  . sodium chloride flush  3 mL Intravenous Q12H    BMET    Component Value Date/Time   NA 121 (L) 06/10/2016 0447   K 4.3 06/10/2016 0447   CL 89 (L) 06/10/2016 0447   CO2 22 06/10/2016 0447   GLUCOSE 119 (H) 06/10/2016 0447   BUN 17 06/10/2016 0447   CREATININE 0.68 06/10/2016 0447   CALCIUM 8.8 (L) 06/10/2016 0447   GFRNONAA >60 06/10/2016 0447   GFRAA >60 06/10/2016 0447   CBC    Component Value Date/Time   WBC 15.3 (H) 06/10/2016 0447   RBC 3.12 (L) 06/10/2016 0447   HGB 10.3 (L) 06/10/2016 0447   HCT 28.3 (L) 06/10/2016 0447   PLT 246 06/10/2016 0447   MCV 90.7 06/10/2016 0447   MCH 33.0 06/10/2016 0447   MCHC 36.4 (H) 06/10/2016 0447   RDW 12.4 06/10/2016 0447   LYMPHSABS 0.9 06/10/2016 0447   MONOABS 0.9 06/10/2016 0447   EOSABS 0.0 06/10/2016 0447   BASOSABS 0.0 06/10/2016 0447      Assessment/Plan: 1.  Hyponatremia- initially consistent with volume depletion (low UNa and low Uosm with some component of reset osmostat as Uosm was not maximally dillute at 222) but sodium worsened with IVF's and now he is up 1 liter with drop in sodium.  CXR consistent with volume overload and agree with IV Lasix today.  Will repeat sodium level and if continues to drop, we may need to tx to icu.  Currently with mild nausea but otherwise asymptomatic 1. Will hold ACE-inhibitor for now as well 2. Increase lasix to 40mg  iv bid and follow given respiratory distress last pm and evidence of volume overload on exam today. 3. Consistent with hypervolemia  hyponatremia 4. Improving with diuresis 2. Critical AS- will need right and left heart cath and evaluation for TAVR 3. HTN- stable 4. CAP- on azithromycin per primary 5. Pulmonary edema/CHF- likely related to critical AS and very sensitive to volume.  Agree with diuresis as well as left and right heart cath.   Donetta Potts, MD [employer] [phone]

## 2016-06-10 NOTE — Progress Notes (Signed)
Patient had increased labored breathing, RR >30. Inspiratory and expiratory wheezes, along with rhonchi. upon ausculation. O2 levels 89% on 3 liters, increased to 4 liters on nasal canula. Patient very agitated and restless. Paged MD Hal Hope. Ordered a stat chest X-ray and Lasix IV 20 mg once.  Assessed patient after administration of lasix, no wheezing but still lungs sound wet. Patient in less distress.  18- paged MD Hal Hope to request a breathing treatment for patient and for anxiety. Patient gets agitated and states there is a bear in his room. Re- orientated patient. Will continue to monitor.

## 2016-06-11 DIAGNOSIS — I2583 Coronary atherosclerosis due to lipid rich plaque: Secondary | ICD-10-CM

## 2016-06-11 LAB — BASIC METABOLIC PANEL
Anion gap: 9 (ref 5–15)
BUN: 15 mg/dL (ref 6–20)
CALCIUM: 8.7 mg/dL — AB (ref 8.9–10.3)
CO2: 28 mmol/L (ref 22–32)
Chloride: 87 mmol/L — ABNORMAL LOW (ref 101–111)
Creatinine, Ser: 0.7 mg/dL (ref 0.61–1.24)
GFR calc Af Amer: >60 mL/min (ref >60)
GLUCOSE: 119 mg/dL — AB (ref 65–99)
Potassium: 3.6 mmol/L (ref 3.5–5.1)
Sodium: 124 mmol/L — ABNORMAL LOW (ref 135–145)

## 2016-06-11 LAB — CBC WITH DIFFERENTIAL/PLATELET
BASOS ABS: 0 10*3/uL (ref 0.0–0.1)
Basophils Relative: 0 %
EOS ABS: 0.1 10*3/uL (ref 0.0–0.7)
EOS PCT: 1 %
HCT: 29.3 % — ABNORMAL LOW (ref 39.0–52.0)
Hemoglobin: 10.2 g/dL — ABNORMAL LOW (ref 13.0–17.0)
LYMPHS ABS: 0.7 10*3/uL (ref 0.7–4.0)
LYMPHS PCT: 6 %
MCH: 31.8 pg (ref 26.0–34.0)
MCHC: 34.8 g/dL (ref 30.0–36.0)
MCV: 91.3 fL (ref 78.0–100.0)
MONO ABS: 1.1 10*3/uL — AB (ref 0.1–1.0)
Monocytes Relative: 10 %
Neutro Abs: 10 10*3/uL — ABNORMAL HIGH (ref 1.7–7.7)
Neutrophils Relative %: 83 %
PLATELETS: 239 10*3/uL (ref 150–400)
RBC: 3.21 MIL/uL — ABNORMAL LOW (ref 4.22–5.81)
RDW: 12.1 % (ref 11.5–15.5)
WBC: 12 10*3/uL — ABNORMAL HIGH (ref 4.0–10.5)

## 2016-06-11 LAB — PROTIME-INR
INR: 1.29
Prothrombin Time: 16.2 seconds — ABNORMAL HIGH (ref 11.4–15.2)

## 2016-06-11 MED ORDER — MAGNESIUM HYDROXIDE 400 MG/5ML PO SUSP
30.0000 mL | Freq: Every day | ORAL | Status: DC | PRN
  Administered 2016-06-11 – 2016-06-14 (×4): 30 mL via ORAL
  Filled 2016-06-11 (×4): qty 30

## 2016-06-11 MED ORDER — SODIUM CHLORIDE 0.9% FLUSH: 3.0000 mL | INTRAVENOUS | Status: DC | PRN

## 2016-06-11 MED ORDER — SODIUM CHLORIDE 0.9 % WEIGHT BASED INFUSION: 1.0000 mL/kg/h | INTRAVENOUS | Status: DC

## 2016-06-11 MED ORDER — MAGNESIUM HYDROXIDE NICU ORAL SYRINGE 400 MG/5 ML
30.0000 mL | Freq: Every day | ORAL | Status: DC | PRN
  Filled 2016-06-11: qty 30

## 2016-06-11 MED ORDER — SODIUM CHLORIDE 0.9 % WEIGHT BASED INFUSION
3.0000 mL/kg/h | INTRAVENOUS | Status: AC
  Administered 2016-06-12: 3 mL/kg/h via INTRAVENOUS

## 2016-06-11 MED ORDER — SODIUM CHLORIDE 0.9% FLUSH
3.0000 mL | Freq: Two times a day (BID) | INTRAVENOUS | Status: DC
Start: 2016-06-11 — End: 2016-06-13
  Administered 2016-06-11 – 2016-06-13 (×4): 3 mL via INTRAVENOUS

## 2016-06-11 MED ORDER — SODIUM CHLORIDE 0.9 % IV SOLN: 250.0000 mL | INTRAVENOUS | Status: DC | PRN

## 2016-06-11 MED ORDER — ASPIRIN 81 MG PO CHEW
81.0000 mg | CHEWABLE_TABLET | ORAL | Status: AC
  Administered 2016-06-12: 81 mg via ORAL
  Filled 2016-06-11: qty 1

## 2016-06-11 NOTE — Plan of Care (Signed)
Problem: Activity: Goal: Risk for activity intolerance will decrease Outcome: Progressing OOB in Chair x 2 hours today

## 2016-06-11 NOTE — Plan of Care (Signed)
Problem: Safety: Goal: Ability to remain free from injury will improve Outcome: Progressing Patient does fairly well with using his call light for assistance, call light on his lap, bedside stand within reach with personal items and phone within reach. Patient is aware of the bed alarm being on and is okay with it, will monitor closely.

## 2016-06-11 NOTE — Progress Notes (Signed)
Patient ID: Richard Simon, male   DOB: 1929-11-28, 83 y.o.   MRN: [id number] S:Feeling better, off of Bipap O:BP 121/68 (BP Location: Left Arm)   Pulse (!) 110   Temp (!) 96.7 F (35.9 C) (Oral)   Resp 20   Ht 5\' 9"  (1.753 m)   Wt 85.8 kg (189 lb 3.2 oz)   SpO2 96%   BMI 27.94 kg/m   Intake/Output Summary (Last 24 hours) at 06/11/16 0849 Last data filed at 06/11/16 0644  Gross per 24 hour  Intake              241 ml  Output             1700 ml  Net            -1459 ml   Intake/Output: I/O last 3 completed shifts: In: 261 [P.O.:258; I.V.:3] Out: 2300 [Urine:2300]  Intake/Output this shift:  No intake/output data recorded. Weight change: -0.363 kg (-12.8 oz) Gen:NAD CVS:IRR, no rub, III/VI SEM Resp:scattered rhonchi [id number], +BS, nontender Ext:+pedal and presacral edema   Recent Labs Lab 06/08/16 1927 06/09/16 0448 06/09/16 1249 06/09/16 1709 06/10/16 0447 06/10/16 1639 06/11/16 0435  NA 119* 117* 118* 120* 121* 122* 124*  K 4.6 4.6 4.1 4.5 4.3 4.2 3.6  CL 88* 87* 87* 86* 89* 89* 87*  CO2 20* 21* 20* 23 22 26 28   GLUCOSE 140* 128* 180* 139* 119* 131* 119*  BUN 16 17 17 17 17 17 15   CREATININE 0.77 0.62 0.70 0.78 0.68 0.71 0.70  CALCIUM 9.1 8.8* 8.8* 9.2 8.8* 8.9 8.7*   Liver Function Tests: No results for input(s): AST, ALT, ALKPHOS, BILITOT, PROT, ALBUMIN in the last 168 hours. No results for input(s): LIPASE, AMYLASE in the last 168 hours. No results for input(s): AMMONIA in the last 168 hours. CBC:  Recent Labs Lab 06/07/16 0044  06/08/16 0236 06/09/16 0448 06/10/16 0447 06/11/16 0435  WBC 11.6*  --  12.0* 13.9* 15.3* 12.0*  NEUTROABS  --   < > 10.0* 11.6* 13.5* 10.0*  HGB 11.8*  --  11.7* 10.9* 10.3* 10.2*  HCT 32.5*  --  33.1* 30.9* 28.3* 29.3*  MCV 91.8  --  92.2 91.4 90.7 91.3  PLT 187  --  192 214 246 239  < > = values in this interval not displayed. Cardiac Enzymes:  Recent Labs Lab 06/07/16 0723 06/07/16 1357  TROPONINI <0.03  <0.03   CBG: No results for input(s): GLUCAP in the last 168 hours.  Iron Studies: No results for input(s): IRON, TIBC, TRANSFERRIN, FERRITIN in the last 72 hours. Studies/Results: Dg Chest Port 1 View  Result Date: 06/09/2016 CLINICAL DATA:  Shortness of breath. EXAM: PORTABLE CHEST 1 VIEW COMPARISON:  06/09/2016 at 7:35 a.m. FINDINGS: Sternotomy wires unchanged. Lungs are adequately inflated with continued airspace consolidation most prominent over the upper lobes right worse than left which is slightly worse over the right mid to upper lung. Minimal patchy bibasilar opacification unchanged. Possible small amount of bilateral pleural fluid unchanged. Mild stable cardiomegaly. Calcified plaque over the aortic arch. Remainder of the exam is unchanged. IMPRESSION: Multifocal airspace process worse over the upper lobes with possible slight interval worsening over the right upper lobe. Findings are likely due to infection. Possible small amount of bilateral pleural fluid. Mild stable cardiomegaly.  Aortic atherosclerosis. Electronically Signed   By: Marin Olp M.D.   On: 06/09/2016 21:05   . aspirin EC  81 mg Oral Daily  . atenolol  50 mg Oral Daily  . azithromycin  500 mg Intravenous Q24H  . cefTRIAXone (ROCEPHIN)  IV  1 g Intravenous Q24H  . diltiazem  240 mg Oral Daily  . furosemide  40 mg Intravenous BID  . rosuvastatin  10 mg Oral Daily  . sodium chloride flush  3 mL Intravenous Q12H    BMET    Component Value Date/Time   NA 124 (L) 06/11/2016 0435   K 3.6 06/11/2016 0435   CL 87 (L) 06/11/2016 0435   CO2 28 06/11/2016 0435   GLUCOSE 119 (H) 06/11/2016 0435   BUN 15 06/11/2016 0435   CREATININE 0.70 06/11/2016 0435   CALCIUM 8.7 (L) 06/11/2016 0435   GFRNONAA >60 06/11/2016 0435   GFRAA >60 06/11/2016 0435   CBC    Component Value Date/Time   WBC 12.0 (H) 06/11/2016 0435   RBC 3.21 (L) 06/11/2016 0435   HGB 10.2 (L) 06/11/2016 0435   HCT 29.3 (L) 06/11/2016 0435   PLT  239 06/11/2016 0435   MCV 91.3 06/11/2016 0435   MCH 31.8 06/11/2016 0435   MCHC 34.8 06/11/2016 0435   RDW 12.1 06/11/2016 0435   LYMPHSABS 0.7 06/11/2016 0435   MONOABS 1.1 (H) 06/11/2016 0435   EOSABS 0.1 06/11/2016 0435   BASOSABS 0.0 06/11/2016 0435    Assessment/Plan: 1. Hyponatremia- initially consistent with volume depletion (low UNa and low Uosm with some component of reset osmostat as Uosm was not maximally dillute at 222) but sodium worsened with IVF's and now he is up 1 liter with drop in sodium. CXR consistent with volume overload and agree with IV Lasix today. Will repeat sodium level and if continues to drop, we may need to tx to icu. Currently with mild nausea but otherwise asymptomatic 1. Will hold ACE-inhibitor for now as well 2. Increase lasix to 40mg  iv bid and follow given respiratory distress last pm and evidence of volume overload on exam today. 3. Consistent with hypervolemia hyponatremia 4. Improving with diuresis 5. Hold lasix before cath 06/12/16 2. Critical AS- will need right and left heart cath and evaluation for TAVR 3. HTN- stable 4. CAP- on azithromycin per primary 5. Pulmonary edema/CHF- likely related to critical AS and very sensitive to volume. Agree with diuresis as well as left and right heart cath.   Donetta Potts, MD [employer] [phone]

## 2016-06-11 NOTE — Plan of Care (Signed)
Problem: Safety: Goal: Ability to remain free from injury will improve Outcome: Progressing Patient has call light and phone at his bedside within his reach and explained to him that I was placing hisd bed alarm on for extra protection to help remind him to call for help so that he wouldn't fall and he was in agreement, will need to make more frequent checks on him because he becomes forgetful and will attempt to get OOB to void, will continue to monitor closely.

## 2016-06-11 NOTE — Progress Notes (Signed)
PROGRESS NOTE  Richard Simon [id number] DOB: [birth year] DOA: 06/07/2016 PCP: Merrilee Seashore, MD   LOS: 4 days   Brief Narrative: Richard Simon is a 83 y.o. male with medical history significant of atrial fibrillation on Pradaxa, CAD, CABG, hyperlipidemia, hypertension, moderate to severe aortic stenosis is coming to the emergency department with complaints of on/off chest pressure, radiates to his back, improved by rest for the past week associated with dyspnea, orthopnea and fatigue.  Assessment & Plan: Principal Problem:   Hyponatremia Active Problems:   Aortic stenosis, moderate   Essential hypertension   Dyslipidemia, goal LDL below 70   Atrial fibrillation, permanent (HCC)   CAD (coronary artery disease)   Chest pain   Dyspnea   Acute encephalopathy   Hyponatremia -He is not taking diuretics. He denied potomania initially however told me that he has been drinking "a lot more water" recently.  He has a history of chronic hyponatremia, states that he has strong family history of hyponatremia as well.  On chart review, his sodium has been 125-130 at least dating back since 2010.   -Patient was initially hypovolemic and his sodium improved with gentle hydration, however he developed fluid overload rather rapidly due to his critical aortic stenosis, with worsening hyponatremia.  He required IV Lasix, with improvement in his respiratory status as well as improvement in his sodium levels.  His fluid status is quite tenuous given critical AS -Sodium is improving on diuresis.  Continue.  Chest pain -2D echo done on 2/22 showed an ejection fraction of 45-50%, with mildly reduced systolic function, with diffuse hypokinesis and critical aortic valve stenosis.  Valve area was calculated at 0.26 cm.  Prior to the echo done in April 2017 showed normal EF of 0000000, normal systolic function, with a valve area of 0.55 -appreciate cardiology consultation, it appears that he will undergo left and  right cardiac catheterization on Monday, and may need TAVR  Acute on probable chronic systolic CHF -Suspect related to his aortic valve stenosis, very sensitive to fluids, chest x-ray with mild fluid overload, will continue with Lasix today, renal function has remained stable  CAP -Continue antibiotics today, white count is improving, today day 5/7  Acute encephalopathy -Patient with intermittent confusion, this is likely in the setting of infectious process, as well as probable delirium related to inpatient stay -Improved, alert and oriented 4 this morning  CAD (coronary artery disease) -Continue home medications, cardiology evaluating, he will undergo cath on Monday  Aortic stenosis, moderate >> severe  -Updated 2D echo as below, cards following, now echo reads the aortic stenosis is critical  Essential hypertension -Blood pressure controlled 121/68, continue current regimen  Dyslipidemia, goal LDL below 70 - Continue Crestor  Atrial fibrillation, permanent (HCC) - CHA2DS2-VASc Score of at least 4. - Continue atenolol for rate control, his Pradaxa is now on hold per cardiology given preparation for cath on Monday   DVT prophylaxis: SCDs Code Status: Full code Family Communication: no family at bedside. Initially asked patient what family member he would like me to call for communication and he stated "none".  Tried calling his son [name] today, no answer Disposition Plan: remain inpatient  Consultants:   Cardiology  Nephrology  Procedures:   2D echo Study Conclusions - Left ventricle: The cavity size was normal. Wall thickness was increased in a pattern of mild LVH. Systolic function was normal. The estimated ejection fraction was in the range of 55% to 60%. Wall motion was normal; there were no regional wall  motion abnormalities. - Aortic valve: Valve mobility was restricted. There was severe stenosis. There was moderate regurgitation. Peak velocity (S): 451 cm/s.  Mean gradient (S): 43 mm Hg. - Mitral valve: Moderately calcified annulus. There was mild regurgitation. - Left atrium: The atrium was moderately dilated. - Pulmonary arteries: Systolic pressure was mildly increased. PA peak pressure: 32 mm Hg (S).  Impressions: - Mean gradient 82mmHg from prior echocardiogram. Aortic stenosis has increased.  Antimicrobials:  Ceftriaxone 2/21 >>   azithromycin 2/21 >>  Subjective: -Alert and oriented 4, still mild shortness of breath however feels improved.  Denies any chest pain.  Objective: Vitals:   06/10/16 2024 06/11/16 0006 06/11/16 0334 06/11/16 0820  BP: 100/78 125/72 109/69 121/68  Pulse: (!) 104 99 (!) 107 (!) 110  Resp: 15 20 20 20   Temp: 98.5 F (36.9 C) 98.1 F (36.7 C) 98.5 F (36.9 C) (!) 96.7 F (35.9 C)  TempSrc:    Oral  SpO2: 96% 96% 95% 96%  Weight:   85.8 kg (189 lb 3.2 oz)   Height:        Intake/Output Summary (Last 24 hours) at 06/11/16 0929 Last data filed at 06/11/16 0644  Gross per 24 hour  Intake              123 ml  Output             1650 ml  Net            -1527 ml   Filed Weights   06/08/16 0518 06/10/16 0532 06/11/16 0334  Weight: 85.9 kg (189 lb 6.4 oz) 86.2 kg (190 lb) 85.8 kg (189 lb 3.2 oz)    Examination: Constitutional: NAD Vitals:   06/10/16 2024 06/11/16 0006 06/11/16 0334 06/11/16 0820  BP: 100/78 125/72 109/69 121/68  Pulse: (!) 104 99 (!) 107 (!) 110  Resp: 15 20 20 20   Temp: 98.5 F (36.9 C) 98.1 F (36.7 C) 98.5 F (36.9 C) (!) 96.7 F (35.9 C)  TempSrc:    Oral  SpO2: 96% 96% 95% 96%  Weight:   85.8 kg (189 lb 3.2 oz)   Height:       Eyes: PERRL, lids and conjunctivae normal Respiratory: Scattered wheezing, no crackles.  Cardiovascular: Irregular. No LE edema. 3/6 SEM Abdomen: no tenderness. Bowel sounds positive.  Musculoskeletal: no clubbing / cyanosis.  Neurologic: CN 2-12 grossly intact. Strength 5/5 in all 4.  Psychiatric: Confused.  Alert and oriented x 2.   Appears anxious   Data Reviewed: I have personally reviewed following labs and imaging studies  CBC:  Recent Labs Lab 06/07/16 0044 06/08/16 0236 06/09/16 0448 06/10/16 0447 06/11/16 0435  WBC 11.6* 12.0* 13.9* 15.3* 12.0*  NEUTROABS  --  10.0* 11.6* 13.5* 10.0*  HGB 11.8* 11.7* 10.9* 10.3* 10.2*  HCT 32.5* 33.1* 30.9* 28.3* 29.3*  MCV 91.8 92.2 91.4 90.7 91.3  PLT 187 192 214 246 [id number]   Basic Metabolic Panel:  Recent Labs Lab 06/09/16 1249 06/09/16 1709 06/10/16 0447 06/10/16 1639 06/11/16 0435  NA 118* 120* 121* 122* 124*  K 4.1 4.5 4.3 4.2 3.6  CL 87* 86* 89* 89* 87*  CO2 20* 23 22 26 28   GLUCOSE 180* 139* 119* 131* 119*  BUN 17 17 17 17 15   CREATININE 0.70 0.78 0.68 0.71 0.70  CALCIUM 8.8* 9.2 8.8* 8.9 8.7*   GFR: Estimated Creatinine Clearance: 74.6 mL/min (by C-G formula based on SCr of 0.7 mg/dL). Liver Function Tests: No results  for input(s): AST, ALT, ALKPHOS, BILITOT, PROT, ALBUMIN in the last 168 hours. No results for input(s): LIPASE, AMYLASE in the last 168 hours. No results for input(s): AMMONIA in the last 168 hours. Coagulation Profile:  Recent Labs Lab 06/11/16 0435  INR 1.29   Cardiac Enzymes:  Recent Labs Lab 06/07/16 0723 06/07/16 1357  TROPONINI <0.03 <0.03   BNP (last 3 results) No results for input(s): PROBNP in the last 8760 hours. HbA1C: No results for input(s): HGBA1C in the last 72 hours. CBG: No results for input(s): GLUCAP in the last 168 hours. Lipid Profile: No results for input(s): CHOL, HDL, LDLCALC, TRIG, CHOLHDL, LDLDIRECT in the last 72 hours. Thyroid Function Tests:  Recent Labs  06/09/16 1324  TSH 1.319   Anemia Panel: No results for input(s): VITAMINB12, FOLATE, FERRITIN, TIBC, IRON, RETICCTPCT in the last 72 hours. Urine analysis:    Component Value Date/Time   COLORURINE YELLOW 12/28/2010 0050   APPEARANCEUR CLEAR 12/28/2010 0050   LABSPEC 1.008 12/28/2010 0050   PHURINE 7.0 12/28/2010 0050    GLUCOSEU NEGATIVE 12/28/2010 0050   HGBUR NEGATIVE 12/28/2010 0050   BILIRUBINUR NEGATIVE 12/28/2010 0050   KETONESUR NEGATIVE 12/28/2010 0050   PROTEINUR NEGATIVE 12/28/2010 0050   UROBILINOGEN 0.2 12/28/2010 0050   NITRITE NEGATIVE 12/28/2010 0050   LEUKOCYTESUR NEGATIVE 12/28/2010 0050   Sepsis Labs: Invalid input(s): PROCALCITONIN, LACTICIDVEN  Recent Results (from the past 240 hour(s))  Blood culture (routine x 2)     Status: None (Preliminary result)   Collection Time: 06/07/16  2:18 AM  Result Value Ref Range Status   Specimen Description BLOOD RIGHT HAND  Final   Special Requests BOTTLES DRAWN AEROBIC AND ANAEROBIC 5ML  Final   Culture NO GROWTH 3 DAYS  Final   Report Status PENDING  Incomplete  Blood culture (routine x 2)     Status: None (Preliminary result)   Collection Time: 06/07/16  2:35 AM  Result Value Ref Range Status   Specimen Description BLOOD LEFT ARM  Final   Special Requests BOTTLES DRAWN AEROBIC AND ANAEROBIC 5ML  Final   Culture NO GROWTH 3 DAYS  Final   Report Status PENDING  Incomplete  Culture, sputum-assessment     Status: None   Collection Time: 06/07/16 11:21 AM  Result Value Ref Range Status   Specimen Description SPUTUM  Final   Special Requests Immunocompromised  Final   Sputum evaluation THIS SPECIMEN IS ACCEPTABLE FOR SPUTUM CULTURE  Final   Report Status 06/07/2016 FINAL  Final  Culture, respiratory (NON-Expectorated)     Status: None   Collection Time: 06/07/16 11:21 AM  Result Value Ref Range Status   Specimen Description SPUTUM  Final   Special Requests Immunocompromised Reflexed from [id number]  Final   Gram Stain   Final    MODERATE WBC PRESENT, PREDOMINANTLY PMN FEW SQUAMOUS EPITHELIAL CELLS PRESENT RARE GRAM POSITIVE COCCOBACILLUS RARE GRAM POSITIVE COCCI IN PAIRS RARE GRAM NEGATIVE COCCOBACILLI    Culture Consistent with normal respiratory flora.  Final   Report Status 06/09/2016 FINAL  Final  MRSA PCR Screening     Status: None     Collection Time: 06/07/16  3:05 PM  Result Value Ref Range Status   MRSA by PCR NEGATIVE NEGATIVE Final    Comment:        The GeneXpert MRSA Assay (FDA approved for NASAL specimens only), is one component of a comprehensive MRSA colonization surveillance program. It is not intended to diagnose MRSA infection nor to guide  or monitor treatment for MRSA infections.       Radiology Studies: Dg Chest Port 1 View  Result Date: 06/09/2016 CLINICAL DATA:  Shortness of breath. EXAM: PORTABLE CHEST 1 VIEW COMPARISON:  06/09/2016 at 7:35 a.m. FINDINGS: Sternotomy wires unchanged. Lungs are adequately inflated with continued airspace consolidation most prominent over the upper lobes right worse than left which is slightly worse over the right mid to upper lung. Minimal patchy bibasilar opacification unchanged. Possible small amount of bilateral pleural fluid unchanged. Mild stable cardiomegaly. Calcified plaque over the aortic arch. Remainder of the exam is unchanged. IMPRESSION: Multifocal airspace process worse over the upper lobes with possible slight interval worsening over the right upper lobe. Findings are likely due to infection. Possible small amount of bilateral pleural fluid. Mild stable cardiomegaly.  Aortic atherosclerosis. Electronically Signed   By: Marin Olp M.D.   On: 06/09/2016 21:05     Scheduled Meds: . [START ON 06/12/2016] aspirin  81 mg Oral Pre-Cath  . aspirin EC  81 mg Oral Daily  . atenolol  50 mg Oral Daily  . azithromycin  500 mg Intravenous Q24H  . cefTRIAXone (ROCEPHIN)  IV  1 g Intravenous Q24H  . diltiazem  240 mg Oral Daily  . furosemide  40 mg Intravenous BID  . rosuvastatin  10 mg Oral Daily  . sodium chloride flush  3 mL Intravenous Q12H  . sodium chloride flush  3 mL Intravenous Q12H   Continuous Infusions: . [START ON 06/12/2016] sodium chloride     Followed by  . [START ON 06/12/2016] sodium chloride       Marzetta Board, MD, PhD [facility] Pager [phone]  If 7PM-7AM, please contact night-coverage [email or web address] Password [facility] 06/11/2016, 9:29 AM

## 2016-06-11 NOTE — Progress Notes (Signed)
Progress Note  Patient Name: Richard Simon Date of Encounter: 06/11/2016  Primary Cardiologist: Ellyn Hack   Subjective   83 yo male with PMH of CAD s/p CABG. Mod-Severe AS, per AFib, HTN, and HL who presented with chest pain, and dyspnea; FU echo shows severe AS; being evaluated for possible TAVR; also being evaluated for chronic hyponatremia  Denies CP or dyspnea; sleepy; mildly lethargic  Inpatient Medications    Scheduled Meds: . aspirin EC  81 mg Oral Daily  . atenolol  50 mg Oral Daily  . azithromycin  500 mg Intravenous Q24H  . cefTRIAXone (ROCEPHIN)  IV  1 g Intravenous Q24H  . diltiazem  240 mg Oral Daily  . furosemide  40 mg Intravenous BID  . rosuvastatin  10 mg Oral Daily  . sodium chloride flush  3 mL Intravenous Q12H   Continuous Infusions:  PRN Meds: acetaminophen, ALPRAZolam, guaiFENesin-dextromethorphan, HYDROcodone-acetaminophen, levalbuterol, ondansetron **OR** ondansetron (ZOFRAN) IV   Vital Signs    Vitals:   06/10/16 1632 06/10/16 2024 06/11/16 0006 06/11/16 0334  BP: 116/83 100/78 125/72 109/69  Pulse: 94 (!) 104 99 (!) 107  Resp: (!) 26 15 20 20   Temp: 98.2 F (36.8 C) 98.5 F (36.9 C) 98.1 F (36.7 C) 98.5 F (36.9 C)  TempSrc: Oral     SpO2: 96% 96% 96% 95%  Weight:    189 lb 3.2 oz (85.8 kg)  Height:        Intake/Output Summary (Last 24 hours) at 06/11/16 0747 Last data filed at 06/11/16 0644  Gross per 24 hour  Intake              241 ml  Output             1700 ml  Net            -1459 ml   Filed Weights   06/08/16 0518 06/10/16 0532 06/11/16 0334  Weight: 189 lb 6.4 oz (85.9 kg) 190 lb (86.2 kg) 189 lb 3.2 oz (85.8 kg)    Telemetry    Atrial fibrillation; rate reasonably controlled - Personally Reviewed  Physical Exam   GEN: No acute distress.   Neck: supple Cardiac: Irreg. Irreg. , 3/6 systlic murmur  Respiratory:  Mildly diminished BS bases GI: Soft, nontender, non-distended  MS: No edema; No deformity. Neuro:   Nonfocal  Psych: Mildly lethargic  Labs    Chemistry  Recent Labs Lab 06/10/16 0447 06/10/16 1639 06/11/16 0435  NA 121* 122* 124*  K 4.3 4.2 3.6  CL 89* 89* 87*  CO2 22 26 28   GLUCOSE 119* 131* 119*  BUN 17 17 15   CREATININE 0.68 0.71 0.70  CALCIUM 8.8* 8.9 8.7*  GFRNONAA >60 >60 >60  GFRAA >60 >60 >60  ANIONGAP 10 7 9      Hematology  Recent Labs Lab 06/09/16 0448 06/10/16 0447 06/11/16 0435  WBC 13.9* 15.3* 12.0*  RBC 3.38* 3.12* 3.21*  HGB 10.9* 10.3* 10.2*  HCT 30.9* 28.3* 29.3*  MCV 91.4 90.7 91.3  MCH 32.2 33.0 31.8  MCHC 35.3 36.4* 34.8  RDW 12.1 12.4 12.1  PLT 214 246 239    Cardiac Enzymes  Recent Labs Lab 06/07/16 0723 06/07/16 1357  TROPONINI <0.03 <0.03     Recent Labs Lab 06/07/16 0049  TROPIPOC 0.01     BNP  Recent Labs Lab 06/07/16 0044  BNP 277.1*       Radiology    Dg Chest [facility] 1 View  Result Date:  06/09/2016 CLINICAL DATA:  Shortness of breath. EXAM: PORTABLE CHEST 1 VIEW COMPARISON:  06/09/2016 at 7:35 a.m. FINDINGS: Sternotomy wires unchanged. Lungs are adequately inflated with continued airspace consolidation most prominent over the upper lobes right worse than left which is slightly worse over the right mid to upper lung. Minimal patchy bibasilar opacification unchanged. Possible small amount of bilateral pleural fluid unchanged. Mild stable cardiomegaly. Calcified plaque over the aortic arch. Remainder of the exam is unchanged. IMPRESSION: Multifocal airspace process worse over the upper lobes with possible slight interval worsening over the right upper lobe. Findings are likely due to infection. Possible small amount of bilateral pleural fluid. Mild stable cardiomegaly.  Aortic atherosclerosis. Electronically Signed   By: Marin Olp M.D.   On: 06/09/2016 21:05    Patient Profile     83 yo male with PMH of CAD s/p CABG, Severe AS, per AFib, HTN, and HL who presented with chest pain, and dyspnea  Assessment &  Plan    1.  Aortic stenosis:   Echo shows severe AS. This is likely the cause of his symptoms. Plan R and L heart cath in AM  He would be a candidate for TAVR - likely not a candidate for standard AVR  2. Atrial fib:   Holding pradaxa prior to cath. Continue cardizem and atenolol for rate control.  3. HTN:   Controlled.   4. CAD - s/p CABG; continue ASA and statin.  5. Hyponatremia-chronic; Nephrology evaluating; continue present dose of lasix for mild volume excess but will hold this PM in anticipation of cath in AM.   Signed, Kirk Ruths, MD  06/11/2016, 7:47 AM

## 2016-06-12 DIAGNOSIS — R0601 Orthopnea: Secondary | ICD-10-CM

## 2016-06-12 LAB — CBC WITH DIFFERENTIAL/PLATELET
Basophils Absolute: 0 10*3/uL (ref 0.0–0.1)
Basophils Relative: 0 %
EOS ABS: 0.3 10*3/uL (ref 0.0–0.7)
EOS PCT: 3 %
HCT: 30.8 % — ABNORMAL LOW (ref 39.0–52.0)
Hemoglobin: 10.7 g/dL — ABNORMAL LOW (ref 13.0–17.0)
LYMPHS ABS: 1 10*3/uL (ref 0.7–4.0)
Lymphocytes Relative: 9 %
MCH: 31.9 pg (ref 26.0–34.0)
MCHC: 34.7 g/dL (ref 30.0–36.0)
MCV: 91.9 fL (ref 78.0–100.0)
MONO ABS: 1 10*3/uL (ref 0.1–1.0)
Monocytes Relative: 10 %
Neutro Abs: 8.1 10*3/uL — ABNORMAL HIGH (ref 1.7–7.7)
Neutrophils Relative %: 78 %
PLATELETS: 289 10*3/uL (ref 150–400)
RBC: 3.35 MIL/uL — AB (ref 4.22–5.81)
RDW: 12.4 % (ref 11.5–15.5)
WBC: 10.3 10*3/uL (ref 4.0–10.5)

## 2016-06-12 LAB — CULTURE, BLOOD (ROUTINE X 2)
CULTURE: NO GROWTH
Culture: NO GROWTH

## 2016-06-12 LAB — BASIC METABOLIC PANEL
Anion gap: 9 (ref 5–15)
BUN: 11 mg/dL (ref 6–20)
CO2: 29 mmol/L (ref 22–32)
Calcium: 8.6 mg/dL — ABNORMAL LOW (ref 8.9–10.3)
Chloride: 88 mmol/L — ABNORMAL LOW (ref 101–111)
Creatinine, Ser: 0.65 mg/dL (ref 0.61–1.24)
GFR calc Af Amer: >60 mL/min (ref >60)
GFR calc non Af Amer: >60 mL/min (ref >60)
Glucose, Bld: 115 mg/dL — ABNORMAL HIGH (ref 65–99)
Potassium: 2.9 mmol/L — ABNORMAL LOW (ref 3.5–5.1)
Sodium: 126 mmol/L — ABNORMAL LOW (ref 135–145)

## 2016-06-12 MED ORDER — POTASSIUM CHLORIDE CRYS ER 20 MEQ PO TBCR
20.0000 meq | EXTENDED_RELEASE_TABLET | Freq: Two times a day (BID) | ORAL | Status: AC
  Administered 2016-06-12 – 2016-06-13 (×4): 20 meq via ORAL
  Filled 2016-06-12 (×4): qty 1

## 2016-06-12 MED ORDER — POTASSIUM CHLORIDE CRYS ER 20 MEQ PO TBCR
60.0000 meq | EXTENDED_RELEASE_TABLET | Freq: Once | ORAL | Status: AC
  Administered 2016-06-12: 60 meq via ORAL
  Filled 2016-06-12: qty 3

## 2016-06-12 MED ORDER — FUROSEMIDE 10 MG/ML IJ SOLN
40.0000 mg | Freq: Once | INTRAMUSCULAR | Status: AC
  Administered 2016-06-12: 40 mg via INTRAMUSCULAR
  Filled 2016-06-12: qty 4

## 2016-06-12 NOTE — Progress Notes (Signed)
Talked to pt's son over phone.  Says, "running late. Will still be there tonight."  Attempted to get telephone consent for pt's heart cath, but son would like to speak to his father first.  Consent is filled out and ready to be signed.  Will let oncoming nurse know.

## 2016-06-12 NOTE — Progress Notes (Signed)
PROGRESS NOTE  Richard Simon [id number] DOB: [birth year] DOA: 06/07/2016 PCP: Merrilee Seashore, MD   LOS: 5 days   Brief Narrative: Richard Simon is a 83 y.o. male with medical history significant of atrial fibrillation on Pradaxa, CAD, CABG, hyperlipidemia, hypertension, moderate to severe aortic stenosis is coming to the emergency department with complaints of on/off chest pressure, radiates to his back, improved by rest for the past week associated with dyspnea, orthopnea and fatigue.  Assessment & Plan: Principal Problem:   Hyponatremia Active Problems:   Aortic stenosis, moderate   Essential hypertension   Dyslipidemia, goal LDL below 70   Atrial fibrillation, permanent (HCC)   CAD (coronary artery disease)   Chest pain   Dyspnea   Acute encephalopathy   Hyponatremia -He is not taking diuretics. He denied potomania initially however told me that he has been drinking "a lot more water" recently.  He has a history of chronic hyponatremia, states that he has strong family history of hyponatremia as well.  On chart review, his sodium has been 125-130 at least dating back since 2010.   -Patient was initially hypovolemic and his sodium improved with gentle hydration, however he developed fluid overload rather rapidly due to his critical aortic stenosis, with worsening hyponatremia.  He required IV Lasix, with improvement in his respiratory status as well as improvement in his sodium levels.  His fluid status is quite tenuous given critical AS -Sodium is improving on diuresis, Lasix expired yesterday, he will undergo cardiac catheterization today, resume diuresis afterwards, monitor renal function  Chest pain -2D echo done on 2/22 showed an ejection fraction of 45-50%, with mildly reduced systolic function, with diffuse hypokinesis and critical aortic valve stenosis.  Valve area was calculated at 0.26 cm.  Prior to the echo done in April 2017 showed normal EF of 0000000, normal systolic  function, with a valve area of 0.55 -appreciate cardiology consultation, it appears that he will undergo left and right cardiac catheterization today, and may need TAVR  Acute on probable chronic systolic CHF -Suspect related to his aortic valve stenosis, very sensitive to fluids, chest x-ray with mild fluid overload, renal function has remained stable, Lasix now off  CAP -Continue antibiotics today, white count is improving, today day 6/7  Acute encephalopathy -Patient with intermittent confusion, this is likely in the setting of infectious process, as well as probable delirium related to inpatient stay -Improved, alert and oriented 4 this morning  CAD (coronary artery disease) -Continue home medications, cardiology evaluating  Aortic stenosis, moderate >> severe  -Updated 2D echo as below, cards following, now echo reads the aortic stenosis is critical  Essential hypertension -Blood pressure controlled 125/90, continue current regimen  Dyslipidemia, goal LDL below 70 - Continue Crestor  Atrial fibrillation, permanent (HCC) - CHA2DS2-VASc Score of at least 4. - Continue atenolol for rate control, his Pradaxa is now on hold per cardiology given preparation for cath today   DVT prophylaxis: SCDs Code Status: Full code Family Communication: no family at bedside. Called son [name] several times without answer. Patient confirmed correct telephone number in epic Disposition Plan: remain inpatient  Consultants:   Cardiology  Nephrology  Procedures:   2D echo Study Conclusions - Left ventricle: The cavity size was normal. Wall thickness was increased in a pattern of mild LVH. Systolic function was normal. The estimated ejection fraction was in the range of 55% to 60%. Wall motion was normal; there were no regional wall motion abnormalities. - Aortic valve: Valve mobility was restricted. There  was severe stenosis. There was moderate regurgitation. Peak velocity (S): 451 cm/s.  Mean gradient (S): 43 mm Hg. - Mitral valve: Moderately calcified annulus. There was mild regurgitation. - Left atrium: The atrium was moderately dilated. - Pulmonary arteries: Systolic pressure was mildly increased. PA peak pressure: 32 mm Hg (S).  Impressions: - Mean gradient 39mmHg from prior echocardiogram. Aortic stenosis has increased.  Antimicrobials:  Ceftriaxone 2/21 >>   azithromycin 2/21 >>  Subjective: -Alert and oriented 4, still mild shortness of breath however feels improved.  Denies any chest pain.  Awaiting cath however wants to speak with Dr. Ellyn Hack prior  Objective: Vitals:   06/12/16 0015 06/12/16 0022 06/12/16 0400 06/12/16 0907  BP: 125/82  125/74 125/90  Pulse: (!) 107  100 (!) 111  Resp: (!) 24   20  Temp:  97.6 F (36.4 C) 98.9 F (37.2 C) 97.5 F (36.4 C)  TempSrc:   Oral Oral  SpO2: 94%   97%  Weight:   83.9 kg (184 lb 14.4 oz)   Height:        Intake/Output Summary (Last 24 hours) at 06/12/16 0923 Last data filed at 06/12/16 [id number]  Gross per 24 hour  Intake           1507.8 ml  Output             1950 ml  Net           -442.2 ml   Filed Weights   06/10/16 0532 06/11/16 0334 06/12/16 0400  Weight: 86.2 kg (190 lb) 85.8 kg (189 lb 3.2 oz) 83.9 kg (184 lb 14.4 oz)    Examination: Constitutional: NAD Vitals:   06/12/16 0015 06/12/16 0022 06/12/16 0400 06/12/16 0907  BP: 125/82  125/74 125/90  Pulse: (!) 107  100 (!) 111  Resp: (!) 24   20  Temp:  97.6 F (36.4 C) 98.9 F (37.2 C) 97.5 F (36.4 C)  TempSrc:   Oral Oral  SpO2: 94%   97%  Weight:   83.9 kg (184 lb 14.4 oz)   Height:       Eyes: PERRL, lids and conjunctivae normal Respiratory: Scattered wheezing, no crackles.  Cardiovascular: Irregular. No LE edema. 3/6 SEM Abdomen: no tenderness. Bowel sounds positive.  Musculoskeletal: no clubbing / cyanosis.  Neurologic: CN 2-12 grossly intact. Strength 5/5 in all 4.  Psychiatric: Confused.  Alert and oriented x 2.  Appears  anxious   Data Reviewed: I have personally reviewed following labs and imaging studies  CBC:  Recent Labs Lab 06/08/16 0236 06/09/16 0448 06/10/16 0447 06/11/16 0435 06/12/16 0257  WBC 12.0* 13.9* 15.3* 12.0* 10.3  NEUTROABS 10.0* 11.6* 13.5* 10.0* 8.1*  HGB 11.7* 10.9* 10.3* 10.2* 10.7*  HCT 33.1* 30.9* 28.3* 29.3* 30.8*  MCV 92.2 91.4 90.7 91.3 91.9  PLT 192 214 246 239 [id number]   Basic Metabolic Panel:  Recent Labs Lab 06/09/16 1709 06/10/16 0447 06/10/16 1639 06/11/16 0435 06/12/16 0257  NA 120* 121* 122* 124* 126*  K 4.5 4.3 4.2 3.6 2.9*  CL 86* 89* 89* 87* 88*  CO2 23 22 26 28 29   GLUCOSE 139* 119* 131* 119* 115*  BUN 17 17 17 15 11   CREATININE 0.78 0.68 0.71 0.70 0.65  CALCIUM 9.2 8.8* 8.9 8.7* 8.6*   GFR: Estimated Creatinine Clearance: 68.7 mL/min (by C-G formula based on SCr of 0.65 mg/dL). Liver Function Tests: No results for input(s): AST, ALT, ALKPHOS, BILITOT, PROT, ALBUMIN in the last 168 hours.  No results for input(s): LIPASE, AMYLASE in the last 168 hours. No results for input(s): AMMONIA in the last 168 hours. Coagulation Profile:  Recent Labs Lab 06/11/16 0435  INR 1.29   Cardiac Enzymes:  Recent Labs Lab 06/07/16 0723 06/07/16 1357  TROPONINI <0.03 <0.03   BNP (last 3 results) No results for input(s): PROBNP in the last 8760 hours. HbA1C: No results for input(s): HGBA1C in the last 72 hours. CBG: No results for input(s): GLUCAP in the last 168 hours. Lipid Profile: No results for input(s): CHOL, HDL, LDLCALC, TRIG, CHOLHDL, LDLDIRECT in the last 72 hours. Thyroid Function Tests:  Recent Labs  06/09/16 1324  TSH 1.319   Anemia Panel: No results for input(s): VITAMINB12, FOLATE, FERRITIN, TIBC, IRON, RETICCTPCT in the last 72 hours. Urine analysis:    Component Value Date/Time   COLORURINE YELLOW 12/28/2010 0050   APPEARANCEUR CLEAR 12/28/2010 0050   LABSPEC 1.008 12/28/2010 0050   PHURINE 7.0 12/28/2010 0050   GLUCOSEU  NEGATIVE 12/28/2010 0050   HGBUR NEGATIVE 12/28/2010 0050   BILIRUBINUR NEGATIVE 12/28/2010 0050   KETONESUR NEGATIVE 12/28/2010 0050   PROTEINUR NEGATIVE 12/28/2010 0050   UROBILINOGEN 0.2 12/28/2010 0050   NITRITE NEGATIVE 12/28/2010 0050   LEUKOCYTESUR NEGATIVE 12/28/2010 0050   Sepsis Labs: Invalid input(s): PROCALCITONIN, LACTICIDVEN  Recent Results (from the past 240 hour(s))  Blood culture (routine x 2)     Status: None (Preliminary result)   Collection Time: 06/07/16  2:18 AM  Result Value Ref Range Status   Specimen Description BLOOD RIGHT HAND  Final   Special Requests BOTTLES DRAWN AEROBIC AND ANAEROBIC 5ML  Final   Culture NO GROWTH 4 DAYS  Final   Report Status PENDING  Incomplete  Blood culture (routine x 2)     Status: None (Preliminary result)   Collection Time: 06/07/16  2:35 AM  Result Value Ref Range Status   Specimen Description BLOOD LEFT ARM  Final   Special Requests BOTTLES DRAWN AEROBIC AND ANAEROBIC 5ML  Final   Culture NO GROWTH 4 DAYS  Final   Report Status PENDING  Incomplete  Culture, sputum-assessment     Status: None   Collection Time: 06/07/16 11:21 AM  Result Value Ref Range Status   Specimen Description SPUTUM  Final   Special Requests Immunocompromised  Final   Sputum evaluation THIS SPECIMEN IS ACCEPTABLE FOR SPUTUM CULTURE  Final   Report Status 06/07/2016 FINAL  Final  Culture, respiratory (NON-Expectorated)     Status: None   Collection Time: 06/07/16 11:21 AM  Result Value Ref Range Status   Specimen Description SPUTUM  Final   Special Requests Immunocompromised Reflexed from [id number]  Final   Gram Stain   Final    MODERATE WBC PRESENT, PREDOMINANTLY PMN FEW SQUAMOUS EPITHELIAL CELLS PRESENT RARE GRAM POSITIVE COCCOBACILLUS RARE GRAM POSITIVE COCCI IN PAIRS RARE GRAM NEGATIVE COCCOBACILLI    Culture Consistent with normal respiratory flora.  Final   Report Status 06/09/2016 FINAL  Final  MRSA PCR Screening     Status: None    Collection Time: 06/07/16  3:05 PM  Result Value Ref Range Status   MRSA by PCR NEGATIVE NEGATIVE Final    Comment:        The GeneXpert MRSA Assay (FDA approved for NASAL specimens only), is one component of a comprehensive MRSA colonization surveillance program. It is not intended to diagnose MRSA infection nor to guide or monitor treatment for MRSA infections.       Radiology Studies:  No results found.   Scheduled Meds: . aspirin EC  81 mg Oral Daily  . atenolol  50 mg Oral Daily  . azithromycin  500 mg Intravenous Q24H  . cefTRIAXone (ROCEPHIN)  IV  1 g Intravenous Q24H  . diltiazem  240 mg Oral Daily  . rosuvastatin  10 mg Oral Daily  . sodium chloride flush  3 mL Intravenous Q12H  . sodium chloride flush  3 mL Intravenous Q12H   Continuous Infusions: . sodium chloride 1 mL/kg/hr (06/12/16 0136)     Marzetta Board, MD, PhD [facility] Pager [phone]  If 7PM-7AM, please contact night-coverage [email or web address] Password [facility] 06/12/2016, 9:23 AM

## 2016-06-12 NOTE — Progress Notes (Signed)
[facility] ROUNDING NOTE   Subjective:   Interval History:  Atrial fibrillation , CAD and CABG  With history of aortic stenosis that was admitted with chest pain. He is scheduled for cardiac catheterization today. He was found to have hypovolemic hyponatremia and was given fluids. He developed some volume overload and is now being diuresed with loop diuretic  His sodium is better.   Objective:  Vital signs in last 24 hours:  Temp:  [96.7 F (35.9 C)-98.9 F (37.2 C)] 98.9 F (37.2 C) (02/26 0400) Pulse Rate:  [57-117] 100 (02/26 0400) Resp:  [20-30] 24 (02/26 0015) BP: (110-125)/(68-82) 125/74 (02/26 0400) SpO2:  [92 %-96 %] 94 % (02/26 0015) Weight:  [83.9 kg (184 lb 14.4 oz)] 83.9 kg (184 lb 14.4 oz) (02/26 0400)  Weight change: -1.95 kg (-4 lb 4.8 oz) Filed Weights   06/10/16 0532 06/11/16 0334 06/12/16 0400  Weight: 86.2 kg (190 lb) 85.8 kg (189 lb 3.2 oz) 83.9 kg (184 lb 14.4 oz)    Intake/Output: I/O last 3 completed shifts: In: 1627.8 [P.O.:640; I.V.:687.8; IV Piggyback:300] Out: 2550 [Urine:2550]   Intake/Output this shift:  No intake/output data recorded.  CVS- RRR RS- CTA ABD- BS present soft non-distended EXT- no edema   Basic Metabolic Panel:  Recent Labs Lab 06/09/16 1709 06/10/16 0447 06/10/16 1639 06/11/16 0435 06/12/16 0257  NA 120* 121* 122* 124* 126*  K 4.5 4.3 4.2 3.6 2.9*  CL 86* 89* 89* 87* 88*  CO2 23 22 26 28 29   GLUCOSE 139* 119* 131* 119* 115*  BUN 17 17 17 15 11   CREATININE 0.78 0.68 0.71 0.70 0.65  CALCIUM 9.2 8.8* 8.9 8.7* 8.6*    Liver Function Tests: No results for input(s): AST, ALT, ALKPHOS, BILITOT, PROT, ALBUMIN in the last 168 hours. No results for input(s): LIPASE, AMYLASE in the last 168 hours. No results for input(s): AMMONIA in the last 168 hours.  CBC:  Recent Labs Lab 06/08/16 0236 06/09/16 0448 06/10/16 0447 06/11/16 0435 06/12/16 0257  WBC 12.0* 13.9* 15.3* 12.0* 10.3  NEUTROABS 10.0*  11.6* 13.5* 10.0* 8.1*  HGB 11.7* 10.9* 10.3* 10.2* 10.7*  HCT 33.1* 30.9* 28.3* 29.3* 30.8*  MCV 92.2 91.4 90.7 91.3 91.9  PLT 192 214 246 239 289    Cardiac Enzymes:  Recent Labs Lab 06/07/16 0723 06/07/16 1357  TROPONINI <0.03 <0.03    BNP: Invalid input(s): POCBNP  CBG: No results for input(s): GLUCAP in the last 168 hours.  Microbiology: Results for orders placed or performed during the hospital encounter of 06/07/16  Blood culture (routine x 2)     Status: None (Preliminary result)   Collection Time: 06/07/16  2:18 AM  Result Value Ref Range Status   Specimen Description BLOOD RIGHT HAND  Final   Special Requests BOTTLES DRAWN AEROBIC AND ANAEROBIC 5ML  Final   Culture NO GROWTH 4 DAYS  Final   Report Status PENDING  Incomplete  Blood culture (routine x 2)     Status: None (Preliminary result)   Collection Time: 06/07/16  2:35 AM  Result Value Ref Range Status   Specimen Description BLOOD LEFT ARM  Final   Special Requests BOTTLES DRAWN AEROBIC AND ANAEROBIC 5ML  Final   Culture NO GROWTH 4 DAYS  Final   Report Status PENDING  Incomplete  Culture, sputum-assessment     Status: None   Collection Time: 06/07/16 11:21 AM  Result Value Ref Range Status   Specimen Description SPUTUM  Final  Special Requests Immunocompromised  Final   Sputum evaluation THIS SPECIMEN IS ACCEPTABLE FOR SPUTUM CULTURE  Final   Report Status 06/07/2016 FINAL  Final  Culture, respiratory (NON-Expectorated)     Status: None   Collection Time: 06/07/16 11:21 AM  Result Value Ref Range Status   Specimen Description SPUTUM  Final   Special Requests Immunocompromised Reflexed from [id number]  Final   Gram Stain   Final    MODERATE WBC PRESENT, PREDOMINANTLY PMN FEW SQUAMOUS EPITHELIAL CELLS PRESENT RARE GRAM POSITIVE COCCOBACILLUS RARE GRAM POSITIVE COCCI IN PAIRS RARE GRAM NEGATIVE COCCOBACILLI    Culture Consistent with normal respiratory flora.  Final   Report Status 06/09/2016 FINAL   Final  MRSA PCR Screening     Status: None   Collection Time: 06/07/16  3:05 PM  Result Value Ref Range Status   MRSA by PCR NEGATIVE NEGATIVE Final    Comment:        The GeneXpert MRSA Assay (FDA approved for NASAL specimens only), is one component of a comprehensive MRSA colonization surveillance program. It is not intended to diagnose MRSA infection nor to guide or monitor treatment for MRSA infections.     Coagulation Studies:  Recent Labs  06/11/16 0435  LABPROT 16.2*  INR 1.29    Urinalysis: No results for input(s): COLORURINE, LABSPEC, PHURINE, GLUCOSEU, HGBUR, BILIRUBINUR, KETONESUR, PROTEINUR, UROBILINOGEN, NITRITE, LEUKOCYTESUR in the last 72 hours.  Invalid input(s): APPERANCEUR    Imaging: No results found.   Medications:   . sodium chloride 1 mL/kg/hr (06/12/16 0136)   . aspirin EC  81 mg Oral Daily  . atenolol  50 mg Oral Daily  . azithromycin  500 mg Intravenous Q24H  . cefTRIAXone (ROCEPHIN)  IV  1 g Intravenous Q24H  . diltiazem  240 mg Oral Daily  . rosuvastatin  10 mg Oral Daily  . sodium chloride flush  3 mL Intravenous Q12H  . sodium chloride flush  3 mL Intravenous Q12H   sodium chloride, acetaminophen, ALPRAZolam, guaiFENesin-dextromethorphan, HYDROcodone-acetaminophen, levalbuterol, magnesium hydroxide, ondansetron **OR** ondansetron (ZOFRAN) IV, sodium chloride flush  Assessment/ Plan:   Hyponatremia  Improved and will continue with diuresis and he is having a very good response to diuretics   Will sign off  -- please call if additional help required    LOS: 5 [facility] Simon W @TODAY @7 :53 AM

## 2016-06-12 NOTE — Progress Notes (Signed)
After having a long discussion with pt, pt decides to stay and continue treatment. Will notify Dr. Cruzita Lederer.

## 2016-06-12 NOTE — Progress Notes (Signed)
Physical Therapy Treatment Patient Details Name: Richard Simon MRN: [id number] DOB: 09/19/1929 Today's Date: 06/12/2016    History of Present Illness Pt is a 83 y.o. male with medical history significant of atrial fibrillation on Pradaxa, CAD, CABG, hyperlipidemia, hypertension, moderate to severe aortic stenosis is coming to the emergency department with complaints of on/off chest pressure, associated with dyspnea, orthopnea and fatigue. He was diagnosed with hyponatremia    PT Comments    Pt requiring mod A for sit to stand due to posterior lean and weakness. Ambulated 34' with min A and RW but required to sit at that point due to O2 sats decreased to 85% on RA and decreased verbalization and poor gait pattern. O2 returned to 94% on 3L O2. Pt lethargic throughout session, falling asleep quickly each time he sat down. Do not feel that he would be safe at home in his current state. PT will continue to follow.    Follow Up Recommendations  SNF;Supervision/Assistance - 24 hour     Equipment Recommendations  Rolling walker with 5" wheels    Recommendations for Other Services       Precautions / Restrictions Precautions Precautions: Fall Restrictions Weight Bearing Restrictions: No    Mobility  Bed Mobility Overal bed mobility: Needs Assistance Bed Mobility: Supine to Sit     Supine to sit: Min guard     General bed mobility comments: increased time needed and min A to manage sheets  Transfers Overall transfer level: Needs assistance Equipment used: Rolling walker (2 wheeled) Transfers: Sit to/from Stand Sit to Stand: Mod assist         General transfer comment: mod A from bed for balance and to correct posterior lean. Min A from recliner   Ambulation/Gait Ambulation/Gait assistance: Min assist Ambulation Distance (Feet): 50 Feet Assistive device: Rolling walker (2 wheeled) Gait Pattern/deviations: Trunk flexed;Decreased step length - right;Decreased step length -  left Gait velocity: decreased Gait velocity interpretation: <1.8 ft/sec, indicative of risk for recurrent falls General Gait Details: pt with very short steps and posterior lean. Consistent min A needed. After 50', pt began to have increased trunk flexion, decreased speed and decreased verbalization, O2 sats decreased to 85% and chair brought behind pt to sit. O2 sats returned to 94% on 3L O2   Stairs            Wheelchair Mobility    Modified Rankin (Stroke Patients Only)       Balance Overall balance assessment: Needs assistance Sitting-balance support: Feet supported;Single extremity supported Sitting balance-Leahy Scale: Fair   Postural control: Posterior lean Standing balance support: Bilateral upper extremity supported Standing balance-Leahy Scale: Poor Standing balance comment: very unsteady in standing, poor postual control                    Cognition Arousal/Alertness: Lethargic Behavior During Therapy: WFL for tasks assessed/performed Overall Cognitive Status: Impaired/Different from baseline Area of Impairment: Safety/judgement;Problem solving;Memory Orientation Level: Disoriented to;Situation;Time   Memory: Decreased short-term memory   Safety/Judgement: Decreased awareness of safety   Problem Solving: Slow processing;Decreased initiation;Requires verbal cues General Comments: pt  very slow to process today, in part due to lethargy. Also with some nonsensical speech    Exercises General Exercises - Lower Extremity Hip Flexion/Marching: AROM;Both;10 reps;Standing Heel Raises: AROM;Both;10 reps;Standing    General Comments        Pertinent Vitals/Pain Pain Assessment: No/denies pain    Home Living  Prior Function            PT Goals (current goals can now be found in the care plan section) Acute Rehab PT Goals Patient Stated Goal: get better PT Goal Formulation: With patient Time For Goal Achievement:  06/16/16 Potential to Achieve Goals: Good Progress towards PT goals: Progressing toward goals    Frequency    Min 3X/week      PT Plan Discharge plan needs to be updated    Co-evaluation             End of Session Equipment Utilized During Treatment: Gait belt Activity Tolerance: Patient limited by lethargy Patient left: in chair;with call bell/phone within reach;with chair alarm set Nurse Communication: Mobility status PT Visit Diagnosis: Muscle weakness (generalized) (M62.81);Unsteadiness on feet (R26.81);Difficulty in walking, not elsewhere classified (R26.2)     Time: [id number] PT Time Calculation (min) (ACUTE ONLY): 25 min  Charges:  $Gait Training: 8-22 mins $Therapeutic Exercise: 8-22 mins                    G Codes:     Leighton Roach, PT  Acute Rehab Services  [place] 06/12/2016, 12:24 PM

## 2016-06-12 NOTE — Care Management Important Message (Signed)
Important Message  Patient Details  Name: Richard Simon MRN: [id number] Date of Birth: 09/13/1929   Medicare Important Message Given:  Yes    Nathen May 06/12/2016, 1:34 PM

## 2016-06-12 NOTE — Progress Notes (Signed)
   06/12/16 1415  Clinical Encounter Type  Visited With Patient  Visit Type Other (Comment) ([place] consult)  Spiritual Encounters  Spiritual Needs Prayer  Stress Factors  Patient Stress Factors None identified  Introduction to Pt. Offered prayer. Pt appreciative of visit.

## 2016-06-12 NOTE — Progress Notes (Signed)
Progress Note  Patient Name: Richard Simon Date of Encounter: 06/12/2016  Primary Cardiologist: Dr. Ellyn Hack  Subjective   No chest pain.  Sleeps better sitting up.   Inpatient Medications    Scheduled Meds: . aspirin EC  81 mg Oral Daily  . atenolol  50 mg Oral Daily  . azithromycin  500 mg Intravenous Q24H  . cefTRIAXone (ROCEPHIN)  IV  1 g Intravenous Q24H  . diltiazem  240 mg Oral Daily  . rosuvastatin  10 mg Oral Daily  . sodium chloride flush  3 mL Intravenous Q12H  . sodium chloride flush  3 mL Intravenous Q12H   Continuous Infusions: . sodium chloride 1 mL/kg/hr (06/12/16 1000)   PRN Meds: sodium chloride, acetaminophen, ALPRAZolam, guaiFENesin-dextromethorphan, HYDROcodone-acetaminophen, levalbuterol, magnesium hydroxide, ondansetron **OR** ondansetron (ZOFRAN) IV, sodium chloride flush   Vital Signs    Vitals:   06/12/16 0907 06/12/16 1000 06/12/16 1100 06/12/16 1200  BP: 125/90     Pulse: (!) 111 92 (!) 48 60  Resp: 20 18 16  (!) 24  Temp: 97.5 F (36.4 C)     TempSrc: Oral     SpO2: 97% 98% 97% 98%  Weight:      Height:        Intake/Output Summary (Last 24 hours) at 06/12/16 1239 Last data filed at 06/12/16 1000  Gross per 24 hour  Intake          2066.15 ml  Output             1725 ml  Net           341.15 ml   Filed Weights   06/10/16 0532 06/11/16 0334 06/12/16 0400  Weight: 190 lb (86.2 kg) 189 lb 3.2 oz (85.8 kg) 184 lb 14.4 oz (83.9 kg)    Telemetry    AFib - Personally Reviewed  ECG      Physical Exam   GEN: No acute distress.   Neck: No JVD Cardiac: RRR, 3/6 systolic murmurs, no rubs, or gallops.  Respiratory: Clear to auscultation bilaterally. GI: Soft, nontender, non-distended  MS: No edema; No deformity. Neuro:  Nonfocal  Psych: Normal affect   Labs    Chemistry Recent Labs Lab 06/10/16 1639 06/11/16 0435 06/12/16 0257  NA 122* 124* 126*  K 4.2 3.6 2.9*  CL 89* 87* 88*  CO2 26 28 29   GLUCOSE 131* 119* 115*    BUN 17 15 11   CREATININE 0.71 0.70 0.65  CALCIUM 8.9 8.7* 8.6*  GFRNONAA >60 >60 >60  GFRAA >60 >60 >60  ANIONGAP 7 9 9      Hematology Recent Labs Lab 06/10/16 0447 06/11/16 0435 06/12/16 0257  WBC 15.3* 12.0* 10.3  RBC 3.12* 3.21* 3.35*  HGB 10.3* 10.2* 10.7*  HCT 28.3* 29.3* 30.8*  MCV 90.7 91.3 91.9  MCH 33.0 31.8 31.9  MCHC 36.4* 34.8 34.7  RDW 12.4 12.1 12.4  PLT 246 239 289    Cardiac Enzymes Recent Labs Lab 06/07/16 0723 06/07/16 1357  TROPONINI <0.03 <0.03    Recent Labs Lab 06/07/16 0049  TROPIPOC 0.01     BNP Recent Labs Lab 06/07/16 0044  BNP 277.1*     DDimer No results for input(s): DDIMER in the last 168 hours.   Radiology    No results found.  Cardiac Studies   Severe AS by echo  Patient Profile     83 y.o. male severe AS, CAD  Assessment & Plan    1) AS: needs R  and L heart cath for TAVR eval.  Apparently had some confusion and there is difficulty finding someone to sign consent for him.  He answered me appropriately earlier today.  Cath put off until tomorrow per Dr. Ellyn Hack, his regular cardiologist.    2) Diuresed for volume overload. Will give a dose of Lasix today as well since no cath.   Signed, Larae Grooms, MD  06/12/2016, 12:39 PM

## 2016-06-13 ENCOUNTER — Encounter (HOSPITAL_COMMUNITY)
Admission: EM | Disposition: A | Discharge status: Skilled Nursing Facility | Payer: Self-pay | Source: Home / Self Care | Attending: Internal Medicine

## 2016-06-13 HISTORY — PX: RIGHT HEART CATH AND CORONARY/GRAFT ANGIOGRAPHY: CATH118265

## 2016-06-13 LAB — CBC WITH DIFFERENTIAL/PLATELET
BASOS ABS: 0 10*3/uL (ref 0.0–0.1)
BASOS PCT: 0 %
EOS PCT: 5 %
Eosinophils Absolute: 0.5 10*3/uL (ref 0.0–0.7)
HEMATOCRIT: 32 % — AB (ref 39.0–52.0)
Hemoglobin: 11 g/dL — ABNORMAL LOW (ref 13.0–17.0)
LYMPHS PCT: 11 %
Lymphs Abs: 1 10*3/uL (ref 0.7–4.0)
MCH: 32.2 pg (ref 26.0–34.0)
MCHC: 34.4 g/dL (ref 30.0–36.0)
MCV: 93.6 fL (ref 78.0–100.0)
MONO ABS: 1 10*3/uL (ref 0.1–1.0)
MONOS PCT: 11 %
NEUTROS ABS: 6.9 10*3/uL (ref 1.7–7.7)
Neutrophils Relative %: 73 %
PLATELETS: 310 10*3/uL (ref 150–400)
RBC: 3.42 MIL/uL — ABNORMAL LOW (ref 4.22–5.81)
RDW: 12.3 % (ref 11.5–15.5)
WBC: 9.4 10*3/uL (ref 4.0–10.5)

## 2016-06-13 LAB — POCT I-STAT 3, VENOUS BLOOD GAS (G3P V)
Acid-Base Excess: 3 mmol/L — ABNORMAL HIGH (ref 0.0–2.0)
Acid-Base Excess: 3 mmol/L — ABNORMAL HIGH (ref 0.0–2.0)
Bicarbonate: 28.1 mmol/L — ABNORMAL HIGH (ref 20.0–28.0)
Bicarbonate: 28.2 mmol/L — ABNORMAL HIGH (ref 20.0–28.0)
O2 SAT: 58 %
O2 Saturation: 54 %
PCO2 VEN: 44.2 mmHg (ref 44.0–60.0)
PCO2 VEN: 44.3 mmHg (ref 44.0–60.0)
PH VEN: 7.41 (ref 7.250–7.430)
TCO2: 29 mmol/L (ref 0–100)
TCO2: 29 mmol/L (ref 0–100)
pH, Ven: 7.412 (ref 7.250–7.430)
pO2, Ven: 28 mmHg — CL (ref 32.0–45.0)
pO2, Ven: 30 mmHg — CL (ref 32.0–45.0)

## 2016-06-13 LAB — POCT I-STAT 3, ART BLOOD GAS (G3+)
Acid-Base Excess: 1 mmol/L (ref 0.0–2.0)
BICARBONATE: 25.2 mmol/L (ref 20.0–28.0)
O2 Saturation: 93 %
PH ART: 7.437 (ref 7.350–7.450)
TCO2: 26 mmol/L (ref 0–100)
pCO2 arterial: 37.4 mmHg (ref 32.0–48.0)
pO2, Arterial: 64 mmHg — ABNORMAL LOW (ref 83.0–108.0)

## 2016-06-13 LAB — BASIC METABOLIC PANEL
ANION GAP: 8 (ref 5–15)
BUN: 11 mg/dL (ref 6–20)
CALCIUM: 8.8 mg/dL — AB (ref 8.9–10.3)
CO2: 29 mmol/L (ref 22–32)
CREATININE: 0.69 mg/dL (ref 0.61–1.24)
Chloride: 93 mmol/L — ABNORMAL LOW (ref 101–111)
GFR calc Af Amer: >60 mL/min (ref >60)
GLUCOSE: 109 mg/dL — AB (ref 65–99)
Potassium: 4.1 mmol/L (ref 3.5–5.1)
Sodium: 130 mmol/L — ABNORMAL LOW (ref 135–145)

## 2016-06-13 SURGERY — RIGHT HEART CATH AND CORONARY/GRAFT ANGIOGRAPHY

## 2016-06-13 MED ORDER — MIDAZOLAM HCL 2 MG/2ML IJ SOLN
INTRAMUSCULAR | Status: DC | PRN
  Administered 2016-06-13: 0.5 mg via INTRAVENOUS

## 2016-06-13 MED ORDER — HEPARIN SODIUM (PORCINE) 1000 UNIT/ML IJ SOLN
INTRAMUSCULAR | Status: AC
  Filled 2016-06-13: qty 1

## 2016-06-13 MED ORDER — IOPAMIDOL (ISOVUE-370) INJECTION 76%
INTRAVENOUS | Status: AC
  Filled 2016-06-13: qty 125

## 2016-06-13 MED ORDER — VERAPAMIL HCL 2.5 MG/ML IV SOLN
INTRAVENOUS | Status: DC | PRN
  Administered 2016-06-13: 10 mL via INTRA_ARTERIAL

## 2016-06-13 MED ORDER — LIDOCAINE HCL (PF) 1 % IJ SOLN
INTRAMUSCULAR | Status: DC | PRN
  Administered 2016-06-13: 10 mL via SUBCUTANEOUS
  Administered 2016-06-13: 2 mL via SUBCUTANEOUS

## 2016-06-13 MED ORDER — SODIUM CHLORIDE 0.9 % IV SOLN: 250.0000 mL | INTRAVENOUS | Status: DC | PRN

## 2016-06-13 MED ORDER — IOPAMIDOL (ISOVUE-370) INJECTION 76%
INTRAVENOUS | Status: DC | PRN
  Administered 2016-06-13: 95 mL via INTRA_ARTERIAL

## 2016-06-13 MED ORDER — SODIUM CHLORIDE 0.9% FLUSH
3.0000 mL | INTRAVENOUS | Status: DC | PRN
  Administered 2016-06-17: 3 mL via INTRAVENOUS
  Filled 2016-06-13: qty 3

## 2016-06-13 MED ORDER — LIDOCAINE HCL (PF) 1 % IJ SOLN
INTRAMUSCULAR | Status: AC
  Filled 2016-06-13: qty 30

## 2016-06-13 MED ORDER — FENTANYL CITRATE (PF) 100 MCG/2ML IJ SOLN
INTRAMUSCULAR | Status: DC | PRN
  Administered 2016-06-13: 12.5 ug via INTRAVENOUS

## 2016-06-13 MED ORDER — DABIGATRAN ETEXILATE MESYLATE 150 MG PO CAPS
150.0000 mg | ORAL_CAPSULE | Freq: Two times a day (BID) | ORAL | Status: DC
  Administered 2016-06-13 – 2016-06-16 (×6): 150 mg via ORAL
  Filled 2016-06-13 (×6): qty 1

## 2016-06-13 MED ORDER — HEPARIN SODIUM (PORCINE) 1000 UNIT/ML IJ SOLN
INTRAMUSCULAR | Status: DC | PRN
  Administered 2016-06-13: 4500 [IU] via INTRAVENOUS

## 2016-06-13 MED ORDER — HEPARIN (PORCINE) IN NACL 2-0.9 UNIT/ML-% IJ SOLN
INTRAMUSCULAR | Status: AC
  Filled 2016-06-13: qty 1000

## 2016-06-13 MED ORDER — SODIUM CHLORIDE 0.9 % IV SOLN: INTRAVENOUS | Status: AC

## 2016-06-13 MED ORDER — MIDAZOLAM HCL 2 MG/2ML IJ SOLN
INTRAMUSCULAR | Status: AC
  Filled 2016-06-13: qty 2

## 2016-06-13 MED ORDER — HEPARIN (PORCINE) IN NACL 2-0.9 UNIT/ML-% IJ SOLN
INTRAMUSCULAR | Status: DC | PRN
  Administered 2016-06-13: 1000 mL

## 2016-06-13 MED ORDER — SODIUM CHLORIDE 0.9% FLUSH
3.0000 mL | Freq: Two times a day (BID) | INTRAVENOUS | Status: DC
  Administered 2016-06-13 – 2016-06-26 (×17): 3 mL via INTRAVENOUS

## 2016-06-13 MED ORDER — VERAPAMIL HCL 2.5 MG/ML IV SOLN
INTRAVENOUS | Status: AC
  Filled 2016-06-13: qty 2

## 2016-06-13 MED ORDER — FENTANYL CITRATE (PF) 100 MCG/2ML IJ SOLN
INTRAMUSCULAR | Status: AC
  Filled 2016-06-13: qty 2

## 2016-06-13 SURGICAL SUPPLY — 17 items
CATH EXPO 5FR FL4 (CATHETERS) ×3 IMPLANT
CATH EXPO 5FR FR4 (CATHETERS) ×3 IMPLANT
CATH INFINITI 5FR AL1 (CATHETERS) ×3 IMPLANT
CATH SWAN GANZ 7F STRAIGHT (CATHETERS) ×3 IMPLANT
COVER PRB 48X5XTLSCP FOLD TPE (BAG) ×1 IMPLANT
COVER PROBE 5X48 (BAG) ×2
DEVICE RAD COMP TR BAND LRG (VASCULAR PRODUCTS) ×3 IMPLANT
GLIDESHEATH SLEND A-KIT 6F 22G (SHEATH) ×3 IMPLANT
GUIDEWIRE .025 260CM (WIRE) ×3 IMPLANT
GUIDEWIRE INQWIRE 1.5J.035X260 (WIRE) ×1 IMPLANT
INQWIRE 1.5J .035X260CM (WIRE) ×3
KIT HEART LEFT (KITS) ×3 IMPLANT
PACK CARDIAC CATHETERIZATION (CUSTOM PROCEDURE TRAY) ×3 IMPLANT
SHEATH PINNACLE 6F 10CM (SHEATH) ×3 IMPLANT
SHEATH PINNACLE 7F 10CM (SHEATH) ×3 IMPLANT
TRANSDUCER W/STOPCOCK (MISCELLANEOUS) ×3 IMPLANT
TUBING CIL FLEX 10 FLL-RA (TUBING) ×3 IMPLANT

## 2016-06-13 NOTE — H&P (View-Only) (Signed)
Progress Note  Patient Name: Richard Simon Date of Encounter: 06/13/2016  Primary Cardiologist: Dr. Ellyn Hack  Subjective   No chest pain.  Sleeps better sitting up.   Inpatient Medications    Scheduled Meds: . aspirin EC  81 mg Oral Daily  . atenolol  50 mg Oral Daily  . azithromycin  500 mg Intravenous Q24H  . cefTRIAXone (ROCEPHIN)  IV  1 g Intravenous Q24H  . diltiazem  240 mg Oral Daily  . potassium chloride  20 mEq Oral BID  . rosuvastatin  10 mg Oral Daily  . sodium chloride flush  3 mL Intravenous Q12H  . sodium chloride flush  3 mL Intravenous Q12H   Continuous Infusions: . sodium chloride Stopped (06/12/16 1905)   PRN Meds: sodium chloride, acetaminophen, ALPRAZolam, guaiFENesin-dextromethorphan, HYDROcodone-acetaminophen, levalbuterol, magnesium hydroxide, ondansetron **OR** ondansetron (ZOFRAN) IV, sodium chloride flush   Vital Signs    Vitals:   06/13/16 0020 06/13/16 0425 06/13/16 0551 06/13/16 0847  BP: 117/81 109/67  107/68  Pulse: 71 (!) 104  82  Resp: 20 20  (!) 21  Temp: 98.3 F (36.8 C)  98 F (36.7 C) 98.4 F (36.9 C)  TempSrc: Oral  Oral Oral  SpO2: 96% (!) 86%  97%  Weight:   191 lb 8 oz (86.9 kg)   Height:        Intake/Output Summary (Last 24 hours) at 06/13/16 1030 Last data filed at 06/13/16 0522  Gross per 24 hour  Intake              420 ml  Output              900 ml  Net             -480 ml   Filed Weights   06/11/16 0334 06/12/16 0400 06/13/16 0551  Weight: 189 lb 3.2 oz (85.8 kg) 184 lb 14.4 oz (83.9 kg) 191 lb 8 oz (86.9 kg)    Telemetry    AFib - Personally Reviewed  ECG      Physical Exam   GEN: No acute distress.   Neck: No JVD Cardiac: RRR, 3/6 systolic murmurs, no rubs, or gallops.  Respiratory: Clear to auscultation bilaterally. GI: Soft, nontender, non-distended  MS: No edema; No deformity. Neuro:  Nonfocal  Psych: Normal affect   Labs    Chemistry  Recent Labs Lab 06/11/16 0435 06/12/16 0257  06/13/16 0352  NA 124* 126* 130*  K 3.6 2.9* 4.1  CL 87* 88* 93*  CO2 28 29 29   GLUCOSE 119* 115* 109*  BUN 15 11 11   CREATININE 0.70 0.65 0.69  CALCIUM 8.7* 8.6* 8.8*  GFRNONAA >60 >60 >60  GFRAA >60 >60 >60  ANIONGAP 9 9 8      Hematology  Recent Labs Lab 06/11/16 0435 06/12/16 0257 06/13/16 0352  WBC 12.0* 10.3 9.4  RBC 3.21* 3.35* 3.42*  HGB 10.2* 10.7* 11.0*  HCT 29.3* 30.8* 32.0*  MCV 91.3 91.9 93.6  MCH 31.8 31.9 32.2  MCHC 34.8 34.7 34.4  RDW 12.1 12.4 12.3  PLT 239 289 310    Cardiac Enzymes  Recent Labs Lab 06/07/16 0723 06/07/16 1357  TROPONINI <0.03 <0.03     Recent Labs Lab 06/07/16 0049  TROPIPOC 0.01     BNP  Recent Labs Lab 06/07/16 0044  BNP 277.1*     DDimer No results for input(s): DDIMER in the last 168 hours.   Radiology    No results found.  Cardiac Studies   Severe AS by echo  Patient Profile     83 y.o. male severe AS, CAD  Assessment & Plan    1) AS: needs R and L heart cath for TAVR eval.  He appears able to sign consent this AM.  He answered me appropriately earlier today.  Cath to be done by Dr. Ellyn Hack.    2) Diuresed for volume overload. Able to lie flat this AM.  His O2 sats were decreased while he was asleep but then increased as he woke up and took deep breaths.  Signed, Larae Grooms, MD  06/13/2016, 10:30 AM

## 2016-06-13 NOTE — Clinical Social Work Note (Signed)
Clinical Social Work Assessment  Patient Details  Name: Richard Simon MRN: [id number] Date of Birth: 06-08-1929  Date of referral:  06/13/16               Reason for consult:  Facility Placement, Discharge Planning                Permission sought to share information with:  Facility Sport and exercise psychologist, Family Supports Permission granted to share information::  Yes, Verbal Permission Granted  Name::     Statistician::  SNFs  Relationship::     Contact Information:     Housing/Transportation Living arrangements for the past 2 months:  Single Family Home Source of Information:  Patient Patient Interpreter Needed:  None Criminal Activity/Legal Involvement Pertinent to Current Situation/Hospitalization:  No - Comment as needed Significant Relationships:  Adult Children, Spouse Lives with:  Adult Children, Spouse Do you feel safe going back to the place where you live?  Yes Need for family participation in patient care:  Yes (Comment)  Care giving concerns:  The patient states that he cannot return home in his current condition and feels that he needs to get stronger with physical therapy.   Social Worker assessment / plan:  CSW met with patient at bedside to complete assessment. The patient is slightly confused but is able to answer CSW's questions appropriately. The patient shares that he lives at home with his wife and son. The son Richard Simon helps take care of the patient's wife. The patient states that he would want to discuss SNF placement with his son Richard Simon before making any final decisions but he is open to SNF placement if needed at time of discharge.  Employment status:  Retired Nurse, adult PT Recommendations:  [place] / Referral to community resources:  [place]  Patient/Family's Response to care:  The patient appears to be happy with the care he is receiving. He verbalizes appreciation for CSW  involvement.   Patient/Family's Understanding of and Emotional Response to Diagnosis, Current Treatment, and Prognosis:  The patient appears to have a fair understanding of the reason his admission and his post DC needs. He is able to articulate that he will be going for a right and left cath today.   Emotional Assessment Appearance:  Appears stated age Attitude/Demeanor/Rapport:  Other (The patient is appropriate and welcoming of CSW.) Affect (typically observed):  Accepting, Appropriate, Calm, Pleasant Orientation:  Oriented to Self, Oriented to Place, Oriented to  Time, Oriented to Situation Alcohol / Substance use:  Not Applicable Psych involvement (Current and /or in the community):  No (Comment)  Discharge Needs  Concerns to be addressed:  Care Coordination, Discharge Planning Concerns Readmission within the last 30 days:    Current discharge risk:  Chronically ill, Physical Impairment Barriers to Discharge:  Continued Medical Work up   Richard Noel, LCSW 06/13/2016, 2:26 PM

## 2016-06-13 NOTE — NC FL2 (Signed)
[place] LEVEL OF CARE SCREENING TOOL     IDENTIFICATION  Patient Name: Richard Simon Birthdate: [birth year] Sex: male Admission Date (Current Location): 06/07/2016  [facility] and [place] Number:  Herbalist and Address:  [facility], [address], [place], [place] [address]      Provider Number: [id number]  Attending Physician Name and Address:  Caren Griffins, MD  Relative Name and Phone Number:       Current Level of Care: Hospital Recommended Level of Care: [place] Prior Approval Number:    Date Approved/Denied:   PASRR Number: [id number]  Discharge Plan: SNF    Current Diagnoses: Patient Active Problem List   Diagnosis Date Noted  . Acute encephalopathy 06/10/2016  . Dyspnea   . Hyponatremia 06/07/2016  . Chest pain 06/07/2016  . Fatigue due to treatment 08/22/2014  . Current use of long term anticoagulation 02/13/2013  . Aortic stenosis, severe   . Essential hypertension   . Dyslipidemia, goal LDL below 70   . Atrial fibrillation, permanent ([place])   . Multivessel Native CAD - s/p CABGx4: LIMA to LAD, SVG-Diag, seqSVG-OM1-OM2 12/11/2002  . CAD (coronary artery disease) 04/17/2002    Orientation RESPIRATION BLADDER Height & Weight     Self, Place  O2 (3L) Continent Weight: 86.9 kg (191 lb 8 oz) Height:  5\' 9"  (175.3 cm)  BEHAVIORAL SYMPTOMS/MOOD NEUROLOGICAL BOWEL NUTRITION STATUS   (NONE)  (NONE) Continent Diet  AMBULATORY STATUS COMMUNICATION OF NEEDS Skin   Limited Assist Verbally Normal                       Personal Care Assistance Level of Assistance  Bathing, Feeding, Dressing Bathing Assistance: Limited assistance Feeding assistance: Independent Dressing Assistance: Limited assistance     Functional Limitations Info  Sight, Hearing, Speech Sight Info: Adequate Hearing Info: Adequate Speech Info: Adequate    SPECIAL CARE FACTORS FREQUENCY  PT (By licensed PT), OT (By  licensed OT)     PT Frequency: 5/week OT Frequency: 5/week            Contractures Contractures Info: Not present    Additional Factors Info  Code Status, Allergies Code Status Info: Full Code Allergies Info: NKDA           Current Medications (06/13/2016):  This is the current hospital active medication list Current Facility-Administered Medications  Medication Dose Route Frequency Provider Last Rate Last Dose  . 0.9 %  sodium chloride infusion  250 mL Intravenous PRN Thayer Headings, MD      . 0.9% sodium chloride infusion  1 mL/kg/hr Intravenous Continuous Thayer Headings, MD   Stopped at 06/12/16 1905  . acetaminophen (TYLENOL) tablet 650 mg  650 mg Oral Q6H PRN Reubin Milan, MD   650 mg at 06/11/16 0908  . ALPRAZolam Duanne Moron) tablet 0.25 mg  0.25 mg Oral QHS PRN Rise Patience, MD   0.25 mg at 06/12/16 2239  . aspirin EC tablet 81 mg  81 mg Oral Daily Reubin Milan, MD   81 mg at 06/13/16 1216  . atenolol (TENORMIN) tablet 50 mg  50 mg Oral Daily Reubin Milan, MD   50 mg at 06/13/16 1217  . azithromycin (ZITHROMAX) 500 mg in dextrose 5 % 250 mL IVPB  500 mg Intravenous Q24H Reubin Milan, MD   500 mg at 06/13/16 0522  . cefTRIAXone (ROCEPHIN) 1 g in dextrose  5 % 50 mL IVPB  1 g Intravenous Q24H Reubin Milan, MD   1 g at 06/13/16 0228  . diltiazem (CARDIZEM CD) 24 hr capsule 240 mg  240 mg Oral Daily Reubin Milan, MD   240 mg at 06/13/16 1215  . guaiFENesin-dextromethorphan (ROBITUSSIN DM) 100-10 MG/5ML syrup 5 mL  5 mL Oral Q4H PRN Caren Griffins, MD   5 mL at 06/11/16 1733  . HYDROcodone-acetaminophen (NORCO/VICODIN) 5-325 MG per tablet 1-2 tablet  1-2 tablet Oral Q4H PRN Jeryl Columbia, NP   2 tablet at 06/13/16 0111  . levalbuterol (XOPENEX) nebulizer solution 0.63 mg  0.63 mg Nebulization Q6H PRN Caren Griffins, MD   0.63 mg at 06/11/16 2223  . magnesium hydroxide (MILK OF MAGNESIA) suspension 30 mL  30 mL Oral Daily PRN Caren Griffins, MD   30 mL at 06/12/16 2239  . ondansetron (ZOFRAN) tablet 4 mg  4 mg Oral Q6H PRN Reubin Milan, MD       Or  . ondansetron [facility]) injection 4 mg  4 mg Intravenous Q6H PRN Reubin Milan, MD   4 mg at 06/10/16 1043  . potassium chloride SA (K-DUR,KLOR-CON) CR tablet 20 mEq  20 mEq Oral BID Jettie Booze, MD   20 mEq at 06/13/16 1216  . rosuvastatin (CRESTOR) tablet 10 mg  10 mg Oral Daily Reubin Milan, MD   10 mg at 06/13/16 1217  . sodium chloride flush (NS) 0.9 % injection 3 mL  3 mL Intravenous Q12H Reubin Milan, MD   3 mL at 06/13/16 1219  . sodium chloride flush (NS) 0.9 % injection 3 mL  3 mL Intravenous Q12H Thayer Headings, MD   3 mL at 06/13/16 1218  . sodium chloride flush (NS) 0.9 % injection 3 mL  3 mL Intravenous PRN Thayer Headings, MD         Discharge Medications: Please see discharge summary for a list of discharge medications.  Relevant Imaging Results:  Relevant Lab Results:   Additional Information SSN: [id number]  Rigoberto Noel, LCSW

## 2016-06-13 NOTE — Clinical Social Work Note (Signed)
Message left with patient's son requesting call back to provide SNF bed offers.  Liz Beach MSW, [place], [place], [id number]

## 2016-06-13 NOTE — Interval H&P Note (Signed)
History and Physical Interval Note:  06/13/2016 2:47 PM  Richard Simon  has presented today for surgery, with the diagnosis of Aortic Stenosis with chest pain and heart failure   The various methods of treatment have been discussed with the patient and family. After consideration of risks, benefits and other options for treatment, the patient has consented to  Procedure(s): Right/Left Heart Cath and Coronary Angiography (N/A) With Possible Percutaneous Coronary Intervention as a surgical intervention .  The patient's history has been reviewed, patient examined, no change in status, stable for surgery.  I have reviewed the patient's chart and labs.  Questions were answered to the patient's satisfaction.     Glenetta Hew

## 2016-06-13 NOTE — Progress Notes (Signed)
Progress Note  Patient Name: Richard Simon Date of Encounter: 06/13/2016  Primary Cardiologist: Dr. Ellyn Hack  Subjective   No chest pain.  Sleeps better sitting up.   Inpatient Medications    Scheduled Meds: . aspirin EC  81 mg Oral Daily  . atenolol  50 mg Oral Daily  . azithromycin  500 mg Intravenous Q24H  . cefTRIAXone (ROCEPHIN)  IV  1 g Intravenous Q24H  . diltiazem  240 mg Oral Daily  . potassium chloride  20 mEq Oral BID  . rosuvastatin  10 mg Oral Daily  . sodium chloride flush  3 mL Intravenous Q12H  . sodium chloride flush  3 mL Intravenous Q12H   Continuous Infusions: . sodium chloride Stopped (06/12/16 1905)   PRN Meds: sodium chloride, acetaminophen, ALPRAZolam, guaiFENesin-dextromethorphan, HYDROcodone-acetaminophen, levalbuterol, magnesium hydroxide, ondansetron **OR** ondansetron (ZOFRAN) IV, sodium chloride flush   Vital Signs    Vitals:   06/13/16 0020 06/13/16 0425 06/13/16 0551 06/13/16 0847  BP: 117/81 109/67  107/68  Pulse: 71 (!) 104  82  Resp: 20 20  (!) 21  Temp: 98.3 F (36.8 C)  98 F (36.7 C) 98.4 F (36.9 C)  TempSrc: Oral  Oral Oral  SpO2: 96% (!) 86%  97%  Weight:   191 lb 8 oz (86.9 kg)   Height:        Intake/Output Summary (Last 24 hours) at 06/13/16 1030 Last data filed at 06/13/16 0522  Gross per 24 hour  Intake              420 ml  Output              900 ml  Net             -480 ml   Filed Weights   06/11/16 0334 06/12/16 0400 06/13/16 0551  Weight: 189 lb 3.2 oz (85.8 kg) 184 lb 14.4 oz (83.9 kg) 191 lb 8 oz (86.9 kg)    Telemetry    AFib - Personally Reviewed  ECG      Physical Exam   GEN: No acute distress.   Neck: No JVD Cardiac: RRR, 3/6 systolic murmurs, no rubs, or gallops.  Respiratory: Clear to auscultation bilaterally. GI: Soft, nontender, non-distended  MS: No edema; No deformity. Neuro:  Nonfocal  Psych: Normal affect   Labs    Chemistry  Recent Labs Lab 06/11/16 0435 06/12/16 0257  06/13/16 0352  NA 124* 126* 130*  K 3.6 2.9* 4.1  CL 87* 88* 93*  CO2 28 29 29   GLUCOSE 119* 115* 109*  BUN 15 11 11   CREATININE 0.70 0.65 0.69  CALCIUM 8.7* 8.6* 8.8*  GFRNONAA >60 >60 >60  GFRAA >60 >60 >60  ANIONGAP 9 9 8      Hematology  Recent Labs Lab 06/11/16 0435 06/12/16 0257 06/13/16 0352  WBC 12.0* 10.3 9.4  RBC 3.21* 3.35* 3.42*  HGB 10.2* 10.7* 11.0*  HCT 29.3* 30.8* 32.0*  MCV 91.3 91.9 93.6  MCH 31.8 31.9 32.2  MCHC 34.8 34.7 34.4  RDW 12.1 12.4 12.3  PLT 239 289 310    Cardiac Enzymes  Recent Labs Lab 06/07/16 0723 06/07/16 1357  TROPONINI <0.03 <0.03     Recent Labs Lab 06/07/16 0049  TROPIPOC 0.01     BNP  Recent Labs Lab 06/07/16 0044  BNP 277.1*     DDimer No results for input(s): DDIMER in the last 168 hours.   Radiology    No results found.  Cardiac Studies   Severe AS by echo  Patient Profile     83 y.o. male severe AS, CAD  Assessment & Plan    1) AS: needs R and L heart cath for TAVR eval.  He appears able to sign consent this AM.  He answered me appropriately earlier today.  Cath to be done by Dr. Ellyn Hack.    2) Diuresed for volume overload. Able to lie flat this AM.  His O2 sats were decreased while he was asleep but then increased as he woke up and took deep breaths.  Signed, Larae Grooms, MD  06/13/2016, 10:30 AM

## 2016-06-13 NOTE — Progress Notes (Signed)
PROGRESS NOTE  Breylin Carlyon [id number] DOB: [birth year] DOA: 06/07/2016 PCP: Merrilee Seashore, MD   LOS: 6 days   Brief Narrative: Bishara Rideaux is a 83 y.o. male with medical history significant of atrial fibrillation on Pradaxa, CAD, CABG, hyperlipidemia, hypertension, moderate to severe aortic stenosis is coming to the emergency department with complaints of on/off chest pressure, radiates to his back, improved by rest for the past week associated with dyspnea, orthopnea and fatigue.  He was found to be hyponatremic in the ED.  He was felt to be dehydrated, and received gentle IV hydration, which resulted in improvement in his sodium however he developed fluid overload quite rapidly.  2D echo later showed critical aortic stenosis and cardiology was consulted, patient was started on IV Lasix, his sodium has improved and cardiology is currently evaluating for TAVR.  Assessment & Plan: Principal Problem:   Hyponatremia Active Problems:   Aortic stenosis, severe   Essential hypertension   Dyslipidemia, goal LDL below 70   Atrial fibrillation, permanent (HCC)   CAD (coronary artery disease)   Chest pain   Dyspnea   Acute encephalopathy   Hyponatremia -He is not taking diuretics. He denied potomania initially however told me that he has been drinking "a lot more water" recently.  He has a history of chronic hyponatremia, states that he has strong family history of hyponatremia as well.  On chart review, his sodium has been 125-130 at least dating back since 2010.   -Patient was initially hypovolemic and his sodium improved with gentle hydration, however he developed fluid overload rather rapidly due to his critical aortic stenosis, with worsening hyponatremia.  He required IV Lasix, with improvement in his respiratory status as well as improvement in his sodium levels.  His fluid status is quite tenuous given critical AS -Sodium is improving on diuresis, 130 today.  Diuresis per cardiology.   Renal function has remained stable.  Chest pain -2D echo done on 2/22 showed an ejection fraction of 45-50%, with mildly reduced systolic function, with diffuse hypokinesis and critical aortic valve stenosis.  Valve area was calculated at 0.26 cm.  Prior to the echo done in April 2017 showed normal EF of 0000000, normal systolic function, with a valve area of 0.55 -appreciate cardiology consultation, it appears that he will undergo left and right cardiac catheterization today, and may need TAVR  Acute on probable chronic systolic CHF -Suspect related to his aortic valve stenosis, very sensitive to fluids, chest x-ray with mild fluid overload, renal function has remained stable, Lasix now off given cath and dye   CAP -Continue antibiotics today, white count is improving, today day 7/7  Acute encephalopathy -Patient with intermittent confusion, this is likely in the setting of infectious process, as well as probable delirium related to inpatient stay -Improved, alert and oriented 4 this morning  CAD (coronary artery disease) -Continue home medications, cardiology evaluating  Aortic stenosis, moderate >> severe  -Updated 2D echo as below, cards following, now echo reads the aortic stenosis is critical  Essential hypertension -Blood pressure controlled 107/68, continue current regimen  Dyslipidemia, goal LDL below 70 - Continue Crestor  Atrial fibrillation, permanent (HCC) - CHA2DS2-VASc Score of at least 4. - Continue atenolol for rate control, his Pradaxa is now on hold per cardiology given preparation for cath today   DVT prophylaxis: SCDs Code Status: Full code Family Communication: no family at bedside. Called son [name] several times without answer. Patient confirmed correct telephone number in epic Disposition Plan: remain inpatient  Consultants:   Cardiology  Nephrology  Procedures:   2D echo Study Conclusions - Left ventricle: The cavity size was normal. Wall  thickness was increased in a pattern of mild LVH. Systolic function was normal. The estimated ejection fraction was in the range of 55% to 60%. Wall motion was normal; there were no regional wall motion abnormalities. - Aortic valve: Valve mobility was restricted. There was severe stenosis. There was moderate regurgitation. Peak velocity (S): 451 cm/s. Mean gradient (S): 43 mm Hg. - Mitral valve: Moderately calcified annulus. There was mild regurgitation. - Left atrium: The atrium was moderately dilated. - Pulmonary arteries: Systolic pressure was mildly increased. PA peak pressure: 32 mm Hg (S).  Impressions: - Mean gradient 75mmHg from prior echocardiogram. Aortic stenosis has increased.  Antimicrobials:  Ceftriaxone 2/21 >>   azithromycin 2/21 >>  Subjective: -Alert and oriented 4, no complaints this morning, awaiting cardiac catheterization  Objective: Vitals:   06/13/16 0020 06/13/16 0425 06/13/16 0551 06/13/16 0847  BP: 117/81 109/67  107/68  Pulse: 71 (!) 104  82  Resp: 20 20  (!) 21  Temp: 98.3 F (36.8 C)  98 F (36.7 C) 98.4 F (36.9 C)  TempSrc: Oral  Oral Oral  SpO2: 96% (!) 86%  97%  Weight:   86.9 kg (191 lb 8 oz)   Height:        Intake/Output Summary (Last 24 hours) at 06/13/16 0956 Last data filed at 06/13/16 0522  Gross per 24 hour  Intake              420 ml  Output             1275 ml  Net             -855 ml   Filed Weights   06/11/16 0334 06/12/16 0400 06/13/16 0551  Weight: 85.8 kg (189 lb 3.2 oz) 83.9 kg (184 lb 14.4 oz) 86.9 kg (191 lb 8 oz)    Examination: Constitutional: NAD Vitals:   06/13/16 0020 06/13/16 0425 06/13/16 0551 06/13/16 0847  BP: 117/81 109/67  107/68  Pulse: 71 (!) 104  82  Resp: 20 20  (!) 21  Temp: 98.3 F (36.8 C)  98 F (36.7 C) 98.4 F (36.9 C)  TempSrc: Oral  Oral Oral  SpO2: 96% (!) 86%  97%  Weight:   86.9 kg (191 lb 8 oz)   Height:       Eyes: PERRL, lids and conjunctivae normal Respiratory: Scattered  wheezing, no crackles.  Cardiovascular: Irregular. No LE edema. 3/6 SEM Abdomen: no tenderness. Bowel sounds positive.  Musculoskeletal: no clubbing / cyanosis.  Neurologic: CN 2-12 grossly intact. Strength 5/5 in all 4.  Psychiatric: Alert and oriented 3   Data Reviewed: I have personally reviewed following labs and imaging studies  CBC:  Recent Labs Lab 06/09/16 0448 06/10/16 0447 06/11/16 0435 06/12/16 0257 06/13/16 0352  WBC 13.9* 15.3* 12.0* 10.3 9.4  NEUTROABS 11.6* 13.5* 10.0* 8.1* 6.9  HGB 10.9* 10.3* 10.2* 10.7* 11.0*  HCT 30.9* 28.3* 29.3* 30.8* 32.0*  MCV 91.4 90.7 91.3 91.9 93.6  PLT 214 246 239 289 [id number]   Basic Metabolic Panel:  Recent Labs Lab 06/10/16 0447 06/10/16 1639 06/11/16 0435 06/12/16 0257 06/13/16 0352  NA 121* 122* 124* 126* 130*  K 4.3 4.2 3.6 2.9* 4.1  CL 89* 89* 87* 88* 93*  CO2 22 26 28 29 29   GLUCOSE 119* 131* 119* 115* 109*  BUN 17 17 15  11 11  CREATININE 0.68 0.71 0.70 0.65 0.69  CALCIUM 8.8* 8.9 8.7* 8.6* 8.8*   GFR: Estimated Creatinine Clearance: 75.1 mL/min (by C-G formula based on SCr of 0.69 mg/dL). Liver Function Tests: No results for input(s): AST, ALT, ALKPHOS, BILITOT, PROT, ALBUMIN in the last 168 hours. No results for input(s): LIPASE, AMYLASE in the last 168 hours. No results for input(s): AMMONIA in the last 168 hours. Coagulation Profile:  Recent Labs Lab 06/11/16 0435  INR 1.29   Cardiac Enzymes:  Recent Labs Lab 06/07/16 0723 06/07/16 1357  TROPONINI <0.03 <0.03   BNP (last 3 results) No results for input(s): PROBNP in the last 8760 hours. HbA1C: No results for input(s): HGBA1C in the last 72 hours. CBG: No results for input(s): GLUCAP in the last 168 hours. Lipid Profile: No results for input(s): CHOL, HDL, LDLCALC, TRIG, CHOLHDL, LDLDIRECT in the last 72 hours. Thyroid Function Tests: No results for input(s): TSH, T4TOTAL, FREET4, T3FREE, THYROIDAB in the last 72 hours. Anemia Panel: No  results for input(s): VITAMINB12, FOLATE, FERRITIN, TIBC, IRON, RETICCTPCT in the last 72 hours. Urine analysis:    Component Value Date/Time   COLORURINE YELLOW 12/28/2010 0050   APPEARANCEUR CLEAR 12/28/2010 0050   LABSPEC 1.008 12/28/2010 0050   PHURINE 7.0 12/28/2010 0050   GLUCOSEU NEGATIVE 12/28/2010 0050   HGBUR NEGATIVE 12/28/2010 0050   BILIRUBINUR NEGATIVE 12/28/2010 0050   KETONESUR NEGATIVE 12/28/2010 0050   PROTEINUR NEGATIVE 12/28/2010 0050   UROBILINOGEN 0.2 12/28/2010 0050   NITRITE NEGATIVE 12/28/2010 0050   LEUKOCYTESUR NEGATIVE 12/28/2010 0050   Sepsis Labs: Invalid input(s): PROCALCITONIN, LACTICIDVEN  Recent Results (from the past 240 hour(s))  Blood culture (routine x 2)     Status: None   Collection Time: 06/07/16  2:18 AM  Result Value Ref Range Status   Specimen Description BLOOD RIGHT HAND  Final   Special Requests BOTTLES DRAWN AEROBIC AND ANAEROBIC 5ML  Final   Culture NO GROWTH 5 DAYS  Final   Report Status 06/12/2016 FINAL  Final  Blood culture (routine x 2)     Status: None   Collection Time: 06/07/16  2:35 AM  Result Value Ref Range Status   Specimen Description BLOOD LEFT ARM  Final   Special Requests BOTTLES DRAWN AEROBIC AND ANAEROBIC 5ML  Final   Culture NO GROWTH 5 DAYS  Final   Report Status 06/12/2016 FINAL  Final  Culture, sputum-assessment     Status: None   Collection Time: 06/07/16 11:21 AM  Result Value Ref Range Status   Specimen Description SPUTUM  Final   Special Requests Immunocompromised  Final   Sputum evaluation THIS SPECIMEN IS ACCEPTABLE FOR SPUTUM CULTURE  Final   Report Status 06/07/2016 FINAL  Final  Culture, respiratory (NON-Expectorated)     Status: None   Collection Time: 06/07/16 11:21 AM  Result Value Ref Range Status   Specimen Description SPUTUM  Final   Special Requests Immunocompromised Reflexed from [id number]  Final   Gram Stain   Final    MODERATE WBC PRESENT, PREDOMINANTLY PMN FEW SQUAMOUS EPITHELIAL  CELLS PRESENT RARE GRAM POSITIVE COCCOBACILLUS RARE GRAM POSITIVE COCCI IN PAIRS RARE GRAM NEGATIVE COCCOBACILLI    Culture Consistent with normal respiratory flora.  Final   Report Status 06/09/2016 FINAL  Final  MRSA PCR Screening     Status: None   Collection Time: 06/07/16  3:05 PM  Result Value Ref Range Status   MRSA by PCR NEGATIVE NEGATIVE Final    Comment:  The GeneXpert MRSA Assay (FDA approved for NASAL specimens only), is one component of a comprehensive MRSA colonization surveillance program. It is not intended to diagnose MRSA infection nor to guide or monitor treatment for MRSA infections.       Radiology Studies: No results found.   Scheduled Meds: . aspirin EC  81 mg Oral Daily  . atenolol  50 mg Oral Daily  . azithromycin  500 mg Intravenous Q24H  . cefTRIAXone (ROCEPHIN)  IV  1 g Intravenous Q24H  . diltiazem  240 mg Oral Daily  . potassium chloride  20 mEq Oral BID  . rosuvastatin  10 mg Oral Daily  . sodium chloride flush  3 mL Intravenous Q12H  . sodium chloride flush  3 mL Intravenous Q12H   Continuous Infusions: . sodium chloride Stopped (06/12/16 1905)   Marzetta Board, MD, PhD [facility] Pager [phone]  If 7PM-7AM, please contact night-coverage [email or web address] Password [facility] 06/13/2016, 9:56 AM

## 2016-06-13 NOTE — Clinical Social Work Placement (Signed)
   CLINICAL SOCIAL WORK PLACEMENT  NOTE  Date:  06/13/2016  Patient Details  Name: Richard Simon MRN: [id number] Date of Birth: 12/26/1929  Clinical Social Work is seeking post-discharge placement for this patient at the [place] level of care (*CSW will initial, date and re-position this form in  chart as items are completed):  Yes   Patient/family provided with [place] Work Department's list of facilities offering this level of care within the geographic area requested by the patient (or if unable, by the patient's family).  Yes   Patient/family informed of their freedom to choose among providers that offer the needed level of care, that participate in Medicare, Medicaid or managed care program needed by the patient, have an available bed and are willing to accept the patient.  Yes   Patient/family informed of [facility]'s ownership interest in [facility] and [facility], as well as of the fact that they are under no obligation to receive care at these facilities.  PASRR submitted to EDS on 06/13/16     PASRR number received on 06/13/16     Existing PASRR number confirmed on       FL2 transmitted to all facilities in geographic area requested by pt/family on 06/13/16     FL2 transmitted to all facilities within larger geographic area on       Patient informed that his/her managed care company has contracts with or will negotiate with certain facilities, including the following:            Patient/family informed of bed offers received.  Patient chooses bed at       Physician recommends and patient chooses bed at      Patient to be transferred to   on  .  Patient to be transferred to facility by       Patient family notified on   of transfer.  Name of family member notified:        PHYSICIAN Please prepare priority discharge summary, including medications, Please prepare prescriptions, Please sign FL2     Additional Comment:     _______________________________________________ Rigoberto Noel, LCSW 06/13/2016, 12:51 PM

## 2016-06-13 NOTE — Progress Notes (Signed)
Site area: rt groin Site Prior to Removal:  Level 0 Pressure Applied For: 10 minutes Manual: yes   Patient Status During Pull:  stable Post Pull Site:  Level 0  Post Pull Instructions Given:  yes Post Pull Pulses Present: yes Dressing Applied:  Gauze and tegaderm Bedrest begins @ [id number] Comments:

## 2016-06-14 ENCOUNTER — Encounter (HOSPITAL_COMMUNITY): Payer: Self-pay | Admitting: Cardiology

## 2016-06-14 LAB — CBC WITH DIFFERENTIAL/PLATELET
BASOS ABS: 0 10*3/uL (ref 0.0–0.1)
BASOS PCT: 0 %
EOS PCT: 4 %
Eosinophils Absolute: 0.6 10*3/uL (ref 0.0–0.7)
HCT: 32.7 % — ABNORMAL LOW (ref 39.0–52.0)
Hemoglobin: 11.2 g/dL — ABNORMAL LOW (ref 13.0–17.0)
Lymphocytes Relative: 8 %
Lymphs Abs: 1 10*3/uL (ref 0.7–4.0)
MCH: 32.4 pg (ref 26.0–34.0)
MCHC: 34.3 g/dL (ref 30.0–36.0)
MCV: 94.5 fL (ref 78.0–100.0)
MONO ABS: 1.1 10*3/uL — AB (ref 0.1–1.0)
Monocytes Relative: 9 %
Neutro Abs: 10 10*3/uL — ABNORMAL HIGH (ref 1.7–7.7)
Neutrophils Relative %: 79 %
PLATELETS: 335 10*3/uL (ref 150–400)
RBC: 3.46 MIL/uL — ABNORMAL LOW (ref 4.22–5.81)
RDW: 12.5 % (ref 11.5–15.5)
WBC: 12.6 10*3/uL — AB (ref 4.0–10.5)

## 2016-06-14 MED ORDER — WITCH HAZEL-GLYCERIN EX PADS
MEDICATED_PAD | CUTANEOUS | Status: DC | PRN
  Administered 2016-06-15: 01:00:00 via TOPICAL
  Filled 2016-06-14: qty 100

## 2016-06-14 NOTE — Plan of Care (Signed)
Problem: Pain Managment: Goal: General experience of comfort will improve Outcome: Progressing Patient has had complaints of a headache today, tylenol given, tolerated well,  and headache relieved.

## 2016-06-14 NOTE — Progress Notes (Signed)
Physical Therapy Treatment Patient Details Name: Richard Simon MRN: [id number] DOB: [birth year] Today's Date: 06/14/2016    History of Present Illness Pt is a 83 y.o. male with medical history significant of atrial fibrillation on Pradaxa, CAD, CABG, hyperlipidemia, hypertension, moderate to severe aortic stenosis is coming to the emergency department with complaints of on/off chest pressure, associated with dyspnea, orthopnea and fatigue. He was diagnosed with hyponatremia    PT Comments    Pt continues to be very fatigued with all mobility. Desat from 93% to 79% on RA with 10' ambulation, became more letharfic, required mod A to ambulate back to chair on 4L O2. O2 sats back up to mid 90's but pt continues to be more lethargic end of session than beginning.  Pt very unsteady even with RW and requires mod A to direct RW safely.    Follow Up Recommendations  SNF;Supervision/Assistance - 24 hour     Equipment Recommendations  Rolling walker with 5" wheels    Recommendations for Other Services       Precautions / Restrictions Precautions Precautions: Fall Restrictions Weight Bearing Restrictions: No    Mobility  Bed Mobility               General bed mobility comments: pt received in chair  Transfers Overall transfer level: Needs assistance Equipment used: Rolling walker (2 wheeled) Transfers: Sit to/from Stand Sit to Stand: Min assist         General transfer comment: min A from recliner and toilet, mild posterior lean but not as strong as 2 days ago.   Ambulation/Gait Ambulation/Gait assistance: Mod assist Ambulation Distance (Feet): 10 Feet (2x) Assistive device: Rolling walker (2 wheeled) Gait Pattern/deviations: Trunk flexed;Decreased step length - right;Decreased step length - left Gait velocity: decreased Gait velocity interpretation: <1.8 ft/sec, indicative of risk for recurrent falls General Gait Details: pt ambulated into bathroom from chair, was  fatigued after using bathroom and was only able to return to chair rather than ambulating in hall. Pt required mod A on the way back to chair, for support and direction of RW    Stairs            Wheelchair Mobility    Modified Rankin (Stroke Patients Only)       Balance Overall balance assessment: Needs assistance Sitting-balance support: Feet supported Sitting balance-Leahy Scale: Fair   Postural control: Posterior lean Standing balance support: Bilateral upper extremity supported Standing balance-Leahy Scale: Poor Standing balance comment: very unsteady in standing, poor postual control                    Cognition Arousal/Alertness: Lethargic Behavior During Therapy: WFL for tasks assessed/performed Overall Cognitive Status: Impaired/Different from baseline Area of Impairment: Orientation;Problem solving;Memory Orientation Level: Disoriented to;Time   Memory: Decreased short-term memory   Safety/Judgement: Decreased awareness of safety   Problem Solving: Slow processing;Decreased initiation;Requires verbal cues General Comments: pt appropriate when alert but when he mobilizes and desats, he becomes more lethargic, slower processing    Exercises      General Comments General comments (skin integrity, edema, etc.): Pt 93% on RA in chair, down to 79% with ambulation so 4L O2 replaced, O2 returned to min 90's.       Pertinent Vitals/Pain Pain Assessment: No/denies pain    Home Living                      Prior Function  PT Goals (current goals can now be found in the care plan section) Acute Rehab PT Goals Patient Stated Goal: get better PT Goal Formulation: With patient Time For Goal Achievement: 06/16/16 Potential to Achieve Goals: Fair Progress towards PT goals: Progressing toward goals    Frequency    Min 3X/week      PT Plan Current plan remains appropriate    Co-evaluation             End of Session  Equipment Utilized During Treatment: Gait belt;Oxygen Activity Tolerance: Patient limited by lethargy Patient left: in chair;with call bell/phone within reach;with chair alarm set Nurse Communication: Mobility status PT Visit Diagnosis: Muscle weakness (generalized) (M62.81);Unsteadiness on feet (R26.81);Difficulty in walking, not elsewhere classified (R26.2)     Time: [id number] PT Time Calculation (min) (ACUTE ONLY): 18 min  Charges:  $Gait Training: 8-22 mins                    G Codes:      Leighton Roach, PT  Acute Rehab Services  [place] 06/14/2016, 3:25 PM

## 2016-06-14 NOTE — Consult Note (Signed)
CARDIOLOGY CONSULT NOTE  Patient ID: Richard Simon, MRN: [id number], DOB/AGE: Nov 07, 1929 83 y.o. 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 He has a history of permanent atrial fibrillation, coronary artery disease status post multivessel CABG in 2004, and aortic stenosis. He is chronically anticoagulated with Pradaxa. The patient was admitted February 21 with progressive exertional dyspnea, orthopnea, and generalized fatigue. He also complained of chest tightness radiating to the upper back. Lab and radiographic studies were suggestive of congestive heart failure with hyponatremia, elevated BNP, and bilateral pulmonary infiltrates. An echocardiogram demonstrated critical aortic stenosis with a mean gradient of 52 mmHg, calculated valve area of 0.3 cm, and reduction in LV function with an LVEF of 45-50%. Cardiac catheterization demonstrated severe native vessel CAD with patency of his bypass grafts.  He is interviewed this evening at the bedside. There are no family members present. The patient cares for his wife who is nearly bed bound. He is retired from [employer]. He is active for his age. Reports that he still drives a car and walks regularly. He has become increasingly weak and short of breath over the past 3-4 weeks. States that he became very short of breath on the day of his hospital admission, prompting him to call 911. Breathing now improved after treatment with diuretics and antibiotics. He also has experienced exertional chest discomfort, but no resting symptoms. No lightheadedness or syncope.   Medical History:  Past Medical History:  Diagnosis Date  . Atrial  fibrillation, permanent ([place])    Rate controlled. Anticoagulation with Pradaxa.  Marland Kitchen CAD (coronary artery disease) 2004   S/P 4-vessel CABG in 2004  . Dyslipidemia, goal LDL below 70   . Hypertension     very well controlled  . Moderate to severe aortic stenosis 01/2013; 06/2014   Echo Eval: a) 01/2013: Mod calcified AoV w/ reduced mobility of R cusp. Mod-Severe stenosis. Mn-Pk gradient 25 mmHg--39 mmHg. AVA ~ 0.79-0.83 cm. Moderate MR. Normal LV size and function EF 55-60%. Elevated LAP. Mild pulmonary attention. Mild moderately dilated left atrium.;; b) 06/2014: EF 60-65%. Mod-Severe AS, AVA~0.69, Mn-Pk gradient 32 mmHg -- 55 mmHg, severe LA dilation, moderate-severe RA d  . S/P CABG x 4 2004   Post CABG      Surgical History:  Past Surgical History:  Procedure Laterality Date  . CARDIAC CATHETERIZATION  12/11/2002   Recommendation-CABG; 80% mid LAD, 70% distal LAD. Mid circumflex 95%, OM1 95%. AV groove circumflex 95%. Proximal RCA 100%  . CORONARY ARTERY BYPASS GRAFT  12/15/2002   LIMA-LAD, SVG-diagonal, SVG-OM1-OM2 sequential  Carlton Adam MYOVIEW  December 2012   Fixed basal inferior diaphragmatic attenuation versusinfarct, no ischemia  . RIGHT HEART CATH AND CORONARY/GRAFT ANGIOGRAPHY N/A 06/13/2016   Procedure: Right Heart Cath and Coronary/Graft Angiography;  Surgeon: Leonie Man, MD;  Location: [facility];  Service: Cardiovascular;  Laterality: N/A;  . TRANSTHORACIC ECHOCARDIOGRAM  September 2013   Normal EF. > 55%; moderate aortic valve stenosis with mild AI. Gradient: Peak 40 mmHg, mean 24 military. Moderate LA dilation. Moderate TR with RV pressure 30-40 mmHg. Mild MR.  . TRANSTHORACIC ECHOCARDIOGRAM  October 2014   Moderate-severe AS with mild AI. Peak and mean gradients no significant change: 39 mmHg and 25  mmHg;; mild LV dilation with EF 55-60%. Elevated LAP. Moderate MR. Mild to moderate LA dilation. Mildly elevated RV pressure.  . TRANSTHORACIC ECHOCARDIOGRAM  March  2016   EF 60-65%. Mod-Severe AS, AVA~0.69, Mn-Pk gradient 32 mmHg -- 55 mmHg, severe LA dilation, moderate-severe RA dilation: Progression of disease     Home Meds: Prior to Admission medications   Medication Sig Start Date End Date Taking? Authorizing Provider  Acetaminophen 500 MG coapsule Take 1 tablet by mouth every 6 (six) hours as needed for pain.  05/18/14  Yes Historical Provider, MD  aspirin EC 81 MG tablet Take 81 mg by mouth daily.   Yes Historical Provider, MD  atenolol (TENORMIN) 50 MG tablet Take 50 mg by mouth daily. 01/08/13  Yes Historical Provider, MD  CARTIA XT 240 MG 24 hr capsule TAKE 1 CAPSULE EVERY DAY 02/07/16  Yes Leonie Man, MD  co-enzyme Q-10 30 MG capsule Take 1 capsule by mouth daily. 04/28/15  Yes Historical Provider, MD  dabigatran (PRADAXA) 150 MG CAPS capsule Take 1 capsule (150 mg total) by mouth 2 (two) times daily. 05/02/16  Yes Leonie Man, MD  lisinopril (PRINIVIL,ZESTRIL) 10 MG tablet Take 1 tablet by mouth at bedtime. 04/24/15  Yes Historical Provider, MD  Omega-3 Fatty Acids (FISH OIL) 1200 MG CAPS Take 1,200 mg by mouth 2 (two) times daily.   Yes Historical Provider, MD  rosuvastatin (CRESTOR) 10 MG tablet TAKE 1 TABLET (10 MG TOTAL) BY MOUTH DAILY. 03/16/16  Yes Leonie Man, MD    Inpatient Medications:  . aspirin EC  81 mg Oral Daily  . atenolol  50 mg Oral Daily  . dabigatran  150 mg Oral Q12H  . diltiazem  240 mg Oral Daily  . rosuvastatin  10 mg Oral Daily  . sodium chloride flush  3 mL Intravenous Q12H  . sodium chloride flush  3 mL Intravenous Q12H     Allergies: No Known Allergies  Social History   Social History  . Marital status: Married    Spouse name: N/A  . Number of children: N/A  . Years of education: N/A   Occupational History  . Not on file.   Social History Main Topics  . Smoking status: Former Smoker    Quit date: 02/12/1975  . Smokeless tobacco: Never Used  . Alcohol use 8.4 oz/week    14 Glasses of  wine per week  . Drug use: Unknown  . Sexual activity: Not on file   Other Topics Concern  . Not on file   Social History Narrative   Married. Father of 45, grandfather 34, great-grandfather 3.   Very active, but without routine exercise. Does yard work and intermittent walking, but nothing routine.   Does not smoke. Occasional alcohol.     Family History  Problem Relation Age of Onset  . Hip fracture Mother      Review of Systems: General: negative for chills, fever, night sweats or weight changes. Positive for generalized weakness. ENT: negative for rhinorrhea or epistaxis Cardiovascular: see HPI Dermatological: negative for rash Respiratory: positive for cough GI: negative for nausea, vomiting, diarrhea, bright red blood per rectum, melena, or hematemesis GU: no hematuria, urgency, or frequency Neurologic: negative for visual changes, syncope, headache, or dizziness Heme: no easy bruising or bleeding Endo: negative for excessive thirst, thyroid disorder, or flushing Musculoskeletal: negative for joint pain or swelling, negative for myalgias  All other systems reviewed and are otherwise negative except as noted above.  Physical Exam: Blood pressure 114/77, pulse (!) 109, temperature 99 F (37.2 C), temperature source Oral, resp. rate (!) 24, height 5\' 9"  (1.753 m), weight 175 lb 1.6 oz (79.4 kg), SpO2 100 %. Pt is alert and oriented, WD, WN, pleasant elderly male in no distress. HEENT: normal Neck: JVP elevated. Carotid upstrokes delayed. No thyromegaly. Lungs: equal expansion, diminished in the bases CV: Apex is discrete and nondisplaced, irregularly irregular with a high-pitched, harsh late peaking 3/6 systolic murmur at the RUSB, absent A2, no diastolic murmur Abd: soft, NT, +BS, no bruit, no hepatosplenomegaly Back: no CVA tenderness Ext: no C/C/E        DP/PT pulses intact and = Skin: warm and dry without rash Neuro: CNII-XII intact             Strength intact =  bilaterally    Labs: No results for input(s): CKTOTAL, CKMB, TROPONINI in the last 72 hours. Lab Results  Component Value Date   WBC 12.6 (H) 06/14/2016   HGB 11.2 (L) 06/14/2016   HCT 32.7 (L) 06/14/2016   MCV 94.5 06/14/2016   PLT 335 06/14/2016    Recent Labs Lab 06/13/16 0352  NA 130*  K 4.1  CL 93*  CO2 29  BUN 11  CREATININE 0.69  CALCIUM 8.8*  GLUCOSE 109*   Lab Results  Component Value Date   CHOL 150 12/28/2010   HDL 56 12/28/2010   LDLCALC 85 12/28/2010   TRIG 45 12/28/2010   No results found for: DDIMER  Radiology/Studies:  Dg Chest 2 View  Result Date: 06/07/2016 CLINICAL DATA:  Chest pain with shortness of breath EXAM: CHEST  2 VIEW COMPARISON:  12/27/2010 FINDINGS: Post sternotomy changes. Mild hyperinflation. Diffuse coarsening of the lung interstitium, suspect chronic change. Hazy opacity within the bilateral upper lobes suspicious for superimposed infection or inflammatory process. Tiny bilateral effusions. Mild cardiomegaly. No pneumothorax. Degenerative changes of the spine. IMPRESSION: 1. Diffuse coarsening of the lung interstitium, suspect chronic interstitial changes. Hazy opacities are present within the bilateral upper lobes and are suspicious for superimposed infectious or inflammatory process. 2. Mild cardiomegaly Electronically Signed   By: Donavan Foil M.D.   On: 06/07/2016 01:16   Dg Chest Port 1 View  Result Date: 06/09/2016 CLINICAL DATA:  Shortness of breath. EXAM: PORTABLE CHEST 1 VIEW COMPARISON:  06/09/2016 at 7:35 a.m. FINDINGS: Sternotomy wires unchanged. Lungs are adequately inflated with continued airspace consolidation most prominent over the upper lobes right worse than left which is slightly worse over the right mid to upper lung. Minimal patchy bibasilar opacification unchanged. Possible small amount of bilateral pleural fluid unchanged. Mild stable cardiomegaly. Calcified plaque over the aortic arch. Remainder of the exam is  unchanged. IMPRESSION: Multifocal airspace process worse over the upper lobes with possible slight interval worsening over the right upper lobe. Findings are likely due to infection. Possible small amount of bilateral pleural fluid. Mild stable cardiomegaly.  Aortic atherosclerosis. Electronically Signed   By: Marin Olp M.D.   On: 06/09/2016 21:05   Dg Chest Port 1 View  Result Date: 06/09/2016 CLINICAL DATA:  Shortness of Breath EXAM: PORTABLE CHEST 1 VIEW COMPARISON:  June 07, 2016 and December 27, 2010 FINDINGS: There is diffuse interstitial prominence, primarily in the upper lobes superimposed on chronic interstitial thickening. There is patchy airspace opacity in the upper lobes and left mid lung region superimposed on interstitial edema. There is cardiomegaly with pulmonary venous hypertension. There are small pleural effusions bilaterally. There is atherosclerotic calcification  in the aorta. Patient is status post coronary artery bypass grafting. No bone lesions. No adenopathy. IMPRESSION: Findings felt to represent congestive heart failure superimposed on chronic interstitial thickening. Relative airspace opacity in portions of the upper lobes likely represents alveolar edema. A degree of superimposed pneumonia in the upper lobes cannot be excluded radiographically. There is aortic atherosclerosis. Appearance is essentially stable compared to 1 day prior. Electronically Signed   By: Lowella Grip III M.D.   On: 06/09/2016 08:04    Cardiac Studies: Echo 06-08-2016: Left ventricle:  The cavity size was normal. Systolic function was mildly reduced. The estimated ejection fraction was in the range of 45% to 50%. Diffuse hypokinesis. The study was not technically sufficient to allow evaluation of LV diastolic dysfunction due to atrial fibrillation.  ------------------------------------------------------------------- Aortic valve:  Poorly visualized.  Severely calcified  annulus. Trileaflet; severely thickened, severely calcified leaflets. Valve mobility was restricted.  Doppler:   There was critical stenosis. There was moderate regurgitation.    VTI ratio of LVOT to aortic valve: 0.11. Valve area (VTI): 0.26 cm^2. Indexed valve area (VTI): 0.13 cm^2/m^2. Peak velocity ratio of LVOT to aortic valve: 0.14. Valve area (Vmax): 0.32 cm^2. Indexed valve area (Vmax): 0.16 cm^2/m^2. Mean velocity ratio of LVOT to aortic valve: 0.11. Valve area (Vmean): 0.26 cm^2. Indexed valve area (Vmean): 0.12 cm^2/m^2.    Mean gradient (S): 52 mm Hg. Peak gradient (S): 79 mm Hg.  ------------------------------------------------------------------- Aorta:  Aortic root: The aortic root was normal in size.  ------------------------------------------------------------------- Mitral valve:   Moderately calcified annulus. Mobility was not restricted.  Doppler:  Transvalvular velocity was within the normal range. There was no evidence for stenosis. There was mild regurgitation.  ------------------------------------------------------------------- Left atrium:  The atrium was mildly dilated.  ------------------------------------------------------------------- Atrial septum:  No defect or patent foramen ovale was identified by color flow Doppler.  ------------------------------------------------------------------- Right ventricle:  The cavity size was normal. Wall thickness was normal. Systolic function was normal.  ------------------------------------------------------------------- Pulmonic valve:    Structurally normal valve.   Cusp separation was normal.  Doppler:  Transvalvular velocity was within the normal range. There was no evidence for stenosis. There was no regurgitation.  ------------------------------------------------------------------- Tricuspid valve:   Structurally normal valve.    Doppler: Transvalvular velocity was within the normal range. There  was trivial regurgitation.  ------------------------------------------------------------------- Pulmonary artery:   The main pulmonary artery was normal-sized. Systolic pressure was severely increased.  ------------------------------------------------------------------- Right atrium:  The atrium was mildly dilated.  ------------------------------------------------------------------- Pericardium:  There was no pericardial effusion.  ------------------------------------------------------------------- Systemic veins: Inferior vena cava: The vessel was dilated. The respirophasic diameter changes were blunted (< 50%), consistent with elevated central venous pressure.  ------------------------------------------------------------------- Measurements   Left ventricle                           Value          Reference  LV ID, ED, PLAX chordal                  50    mm       43 - 52  LV ID, ES, PLAX chordal                  37    mm       23 - 38  LV fx shortening, PLAX chordal   (L)     26    %        >=  29  LV PW thickness, ED                      10    mm       ----------  IVS/LV PW ratio, ED                      0.9            <=1.3  Stroke volume, 2D                        30    ml       ----------  Stroke volume/bsa, 2D                    15    ml/m^2   ----------  LV ejection fraction, 1-p A4C            41    %        ----------  LV end-diastolic volume, 2-p             123   ml       ----------  LV end-systolic volume, 2-p              74    ml       ----------  LV ejection fraction, 2-p                40    %        ----------  Stroke volume, 2-p                       49    ml       ----------  LV end-diastolic volume/bsa, 2-p         60    ml/m^2   ----------  LV end-systolic volume/bsa, 2-p          36    ml/m^2   ----------  Stroke volume/bsa, 2-p                   23.8  ml/m^2   ----------    Ventricular septum                       Value          Reference  IVS  thickness, ED                        9     mm       ----------    LVOT                                     Value          Reference  LVOT ID, S                               17    mm       ----------  LVOT area                                2.27  cm^2     ----------  LVOT peak velocity,  S                    62    cm/s     ----------  LVOT mean velocity, S                    39.4  cm/s     ----------  LVOT VTI, S                              13.1  cm       ----------  LVOT peak gradient, S                    2     mm Hg    ----------    Aortic valve                             Value          Reference  Aortic valve peak velocity, S            444   cm/s     ----------  Aortic valve mean velocity, S            349   cm/s     ----------  Aortic valve VTI, S                      114   cm       ----------  Aortic mean gradient, S                  52    mm Hg    ----------  Aortic peak gradient, S                  79    mm Hg    ----------  VTI ratio, LVOT/AV                       0.11           ----------  Aortic valve area, VTI                   0.26  cm^2     ----------  Aortic valve area/bsa, VTI               0.13  cm^2/m^2 ----------  Velocity ratio, peak, LVOT/AV            0.14           ----------  Aortic valve area, peak velocity         0.32  cm^2     ----------  Aortic valve area/bsa, peak              0.16  cm^2/m^2 ----------  velocity  Velocity ratio, mean, LVOT/AV            0.11           ----------  Aortic valve area, mean velocity         0.26  cm^2     ----------  Aortic valve area/bsa, mean              0.12  cm^2/m^2 ----------  velocity  Aortic regurg pressure half-time         230   ms       ----------  Aorta                                    Value          Reference  Aortic root ID, ED                       33    mm       ----------    Left atrium                              Value          Reference  LA ID, A-P, ES                           46    mm        ----------  LA ID/bsa, A-P                   (H)     2.23  cm/m^2   <=2.2  LA volume, S                             68.8  ml       ----------  LA volume/bsa, S                         33.3  ml/m^2   ----------  LA volume, ES, 1-p A4C                   69.8  ml       ----------  LA volume/bsa, ES, 1-p A4C               33.8  ml/m^2   ----------  LA volume, ES, 1-p A2C                   67.5  ml       ----------  LA volume/bsa, ES, 1-p A2C               32.7  ml/m^2   ----------    Mitral valve                             Value          Reference  Mitral deceleration time         (L)     148   ms       150 - 230    Pulmonary arteries                       Value          Reference  PA pressure, S, DP               (H)     63    mm Hg    <=30    Tricuspid valve                          Value          Reference  Tricuspid regurg peak velocity  346   cm/s     ----------  Tricuspid peak RV-RA gradient            48    mm Hg    ----------    Right atrium                             Value          Reference  RA ID, S-I, ES, A4C              (H)     63.7  mm       34 - 49  RA area, ES, A4C                 (H)     22.5  cm^2     8.3 - 19.5  RA volume, ES, A/L                       64.3  ml       ----------  RA volume/bsa, ES, A/L                   31.2  ml/m^2   ----------    Systemic veins                           Value          Reference  Estimated CVP                            15    mm Hg    ----------    Right ventricle                          Value          Reference  TAPSE                                    13.5  mm       ----------  RV pressure, S, DP               (H)     63    mm Hg    <=30  RV s&', lateral, S                        6.64  cm/s     ----------  Cardiac Cath 06-13-2016: Conclusion     Echocardiographic evidence of Severe/Critical Aortic Stenosis - now symptomatic  Hemodynamic findings consistent with mild secondary pulmonary hypertension.  Mid LAD  lesion, 70 %stenosed. Dist LAD lesion, 80 %stenosed.  LIMA-dLAD is normal in caliber and anatomically normal.  Ost 2nd Diag to 2nd Diag lesion, 95 %stenosed.  SVG-Diag2 is normal in caliber and anatomically normal.  Ost 1st Mrg to 1st Mrg lesion, 100 %stenosed.  Mid Cx lesion, 100 %stenosed.  Seq SVG- OM1-LPL(dCx) and is large and anatomically normal.  Small nondominant RCA patent   4/4 widely patent grafts with severe native coronary disease  Mildly elevated PA pressures with elevated PCWP.  Plan:  Return to nursing unit for ongoing care and management per primary service as well as cardiology consultation team.  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 The patient has been previously asymptomatic, but has developed worsening symptoms and progressive now critical aortic stenosis with a calculated aortic valve area of 0.3 square cm and mean gradient 54 mmHg. I have personally reviewed his cath and echo images. He has bulky aortic valve calcification with severe restricted leaflet mobility. There is mild LV systolic dysfunction present. As outlined above, all of his bypass grafts are patent.   I have reviewed the natural history of aortic stenosis with the patient  today. We have discussed the limitations of medical therapy and the poor prognosis associated with symptomatic aortic stenosis. We have reviewed potential treatment options, including palliative medical therapy, conventional  surgical aortic valve replacement, and transcatheter aortic valve replacement. We discussed treatment options in the context of this patient's specific comorbid medical conditions.   Considering his advanced age, permanent atrial fibrillation, and prior cardiac surgery, TAVR seems to be a reasonable treatment option for this elderly but functional gentleman. He will require formal cardiac surgical consultation and further imaging studies to assess whether TAVR is anatomically feasible. Will order a gated cardiac CTA and a CTA of the chest/abdomen/pelvis to assess vascular access.   Will review his case with the multidisciplinary heart valve team. With symptoms of CHF and critical AS, it is probably best to move forward with TAVR in the near futured after his workup is completed.    Deatra James MD, [facility] 06/14/2016, 9:33 AM

## 2016-06-14 NOTE — Clinical Social Work Note (Signed)
CSW attempted to reach the patient's son at home phone and cell phone numbers listed. CSW was unable to get in touch with the patient's son. Messages left. CSW attempted to reach the patient's son yesterday as well. CSW explained in message that decisions need to be made regarding which SNF we want to move forward with so that insurance auth can be started. The patient will need to discharge home with HHPT services if the son does not provide facility choice in a timely manner.  Liz Beach MSW, [place], [place], JI:[id number]

## 2016-06-14 NOTE — Plan of Care (Signed)
Problem: Respiratory: Goal: Respiratory status will improve Outcome: Progressing Patient on 3L O2, sats 95%, will decrease with ambulation. Lung sounds wheeze upper lobes.

## 2016-06-14 NOTE — Clinical Social Work Note (Signed)
CSW made phone call to patient's son's cell phone and home phone number but got no answer. Message left at son's cell phone number stating that CSW needs to speak with the [facility] regarding discharge planning. Police may need to be sent to the home if we do not hear back from the son.   Liz Beach MSW, [place], [place], JI:[id number]

## 2016-06-14 NOTE — Progress Notes (Signed)
PROGRESS NOTE  Richard Simon [id number] DOB: 05/26/1929 DOA: 06/07/2016   PCP: Merrilee Seashore, MD   LOS: 7 days   Brief Narrative: 83 y.o. male with medical history significant of atrial fibrillation on Pradaxa, CAD, CABG, hyperlipidemia, hypertension, moderate to severe aortic stenosis is coming to the emergency department with complaints of on/off chest pressure, radiates to his back, improved by rest for the past week associated with dyspnea, orthopnea and fatigue.  He was found to be hyponatremic in the ED.  He was felt to be dehydrated, and received gentle IV hydration, which resulted in improvement in his sodium however he developed fluid overload quite rapidly.  2D echo later showed critical aortic stenosis and cardiology was consulted, patient was started on IV Lasix, his sodium has improved and cardiology is currently evaluating for TAVR.  Assessment & Plan:  Hyponatremia - Patient was initially hypovolemic and his sodium improved with gentle hydration, however he developed fluid overload rather rapidly due to his critical aortic stenosis, with worsening hyponatremia.   - He required IV Lasix, with improvement in his respiratory status as well as improvement in his sodium levels. - na overall improving  Chest pain - 2D echo done on 2/22 showed an ejection fraction of 45-50%, with mildly reduced systolic function, with diffuse hypokinesis and critical aortic valve stenosis.  Valve area was calculated at 0.26 cm.  Prior to the echo done in April 2017 showed normal EF of 0000000, normal systolic function, with a valve area of 0.55 - per cardiology may need TAVR, will follow up on recommendations   Acute on probable chronic systolic CHF - Suspect related to his aortic valve stenosis, very sensitive to fluids, chest x-ray with mild fluid overload, renal function has remained stable  CAP - Completed 7 days therapy with ABX   Acute encephalopathy - Patient with intermittent  confusion, this is likely in the setting of infectious process, as well as probable delirium related to inpatient stay - still with intermittent confusion   CAD (coronary artery disease) - Continue home medications, cardiology evaluating  Aortic stenosis, moderate >> severe  -Updated 2D echo as below, cards following, now echo reads the aortic stenosis is critical  Essential hypertension -Blood pressure controlled 107/68, continue current regimen  Dyslipidemia, goal LDL below 70 - Continue Crestor  Atrial fibrillation, permanent (HCC) - CHA2DS2-VASc Score of at least 4. - Continue atenolol for rate control, resume Pradaxa per cardiology   DVT prophylaxis: SCDs Code Status: Full code Family Communication: no family at bedside  Disposition Plan: remains inpatient  Consultants:   Cardiology  Nephrology  Procedures:   2D echo Study Conclusions - Left ventricle: The cavity size was normal. Wall thickness was increased in a pattern of mild LVH. Systolic function was normal. The estimated ejection fraction was in the range of 55% to 60%. Wall motion was normal; there were no regional wall motion abnormalities. - Aortic valve: Valve mobility was restricted. There was severe stenosis. There was moderate regurgitation. Peak velocity (S): 451 cm/s. Mean gradient (S): 43 mm Hg. - Mitral valve: Moderately calcified annulus. There was mild regurgitation. - Left atrium: The atrium was moderately dilated. - Pulmonary arteries: Systolic pressure was mildly increased. PA peak pressure: 32 mm Hg (S).  Impressions: - Mean gradient 67mmHg from prior echocardiogram. Aortic stenosis has increased.  Antimicrobials:  Ceftriaxone 2/21 >>   azithromycin 2/21 >>  Subjective: No events overnight.   Objective: Vitals:   06/13/16 2357 06/14/16 0535 06/14/16 0811 06/14/16 0900  BP:  124/82 124/76 114/77 114/77  Pulse: 62 (!) 42 (!) 122 (!) 109  Resp: (!) 24 (!) 25 (!) 24   Temp: 98.5 F  (36.9 C) 99 F (37.2 C) 99 F (37.2 C)   TempSrc: Oral Oral Oral   SpO2: 98% 98% 100%   Weight:  79.4 kg (175 lb 1.6 oz)    Height:        Intake/Output Summary (Last 24 hours) at 06/14/16 1257 Last data filed at 06/14/16 0902  Gross per 24 hour  Intake              513 ml  Output             1000 ml  Net             -487 ml   Filed Weights   06/12/16 0400 06/13/16 0551 06/14/16 0535  Weight: 83.9 kg (184 lb 14.4 oz) 86.9 kg (191 lb 8 oz) 79.4 kg (175 lb 1.6 oz)    Examination: Constitutional: NAD Vitals:   06/13/16 2357 06/14/16 0535 06/14/16 0811 06/14/16 0900  BP: 124/82 124/76 114/77 114/77  Pulse: 62 (!) 42 (!) 122 (!) 109  Resp: (!) 24 (!) 25 (!) 24   Temp: 98.5 F (36.9 C) 99 F (37.2 C) 99 F (37.2 C)   TempSrc: Oral Oral Oral   SpO2: 98% 98% 100%   Weight:  79.4 kg (175 lb 1.6 oz)    Height:       Eyes: PERRL, lids and conjunctivae normal Respiratory: Scattered wheezing, no crackles.  Cardiovascular: Irregular. No LE edema. 3/6 SEM Abdomen: no tenderness. Bowel sounds positive.  Musculoskeletal: no clubbing / cyanosis.  Neurologic: CN 2-12 grossly intact. Strength 5/5 in all 4.  Psychiatric: Alert and oriented 3  Data Reviewed: I have personally reviewed following labs and imaging studies  CBC:  Recent Labs Lab 06/10/16 0447 06/11/16 0435 06/12/16 0257 06/13/16 0352 06/14/16 0654  WBC 15.3* 12.0* 10.3 9.4 12.6*  NEUTROABS 13.5* 10.0* 8.1* 6.9 10.0*  HGB 10.3* 10.2* 10.7* 11.0* 11.2*  HCT 28.3* 29.3* 30.8* 32.0* 32.7*  MCV 90.7 91.3 91.9 93.6 94.5  PLT 246 239 289 310 [id number]   Basic Metabolic Panel:  Recent Labs Lab 06/10/16 0447 06/10/16 1639 06/11/16 0435 06/12/16 0257 06/13/16 0352  NA 121* 122* 124* 126* 130*  K 4.3 4.2 3.6 2.9* 4.1  CL 89* 89* 87* 88* 93*  CO2 22 26 28 29 29   GLUCOSE 119* 131* 119* 115* 109*  BUN 17 17 15 11 11   CREATININE 0.68 0.71 0.70 0.65 0.69  CALCIUM 8.8* 8.9 8.7* 8.6* 8.8*   Coagulation  Profile:  Recent Labs Lab 06/11/16 0435  INR 1.29   Cardiac Enzymes:  Recent Labs Lab 06/07/16 1357  TROPONINI <0.03   Sepsis Labs: Invalid input(s): PROCALCITONIN, LACTICIDVEN  Recent Results (from the past 240 hour(s))  Blood culture (routine x 2)     Status: None   Collection Time: 06/07/16  2:18 AM  Result Value Ref Range Status   Specimen Description BLOOD RIGHT HAND  Final   Special Requests BOTTLES DRAWN AEROBIC AND ANAEROBIC 5ML  Final   Culture NO GROWTH 5 DAYS  Final   Report Status 06/12/2016 FINAL  Final  Blood culture (routine x 2)     Status: None   Collection Time: 06/07/16  2:35 AM  Result Value Ref Range Status   Specimen Description BLOOD LEFT ARM  Final   Special Requests BOTTLES DRAWN AEROBIC AND  ANAEROBIC 5ML  Final   Culture NO GROWTH 5 DAYS  Final   Report Status 06/12/2016 FINAL  Final  Culture, sputum-assessment     Status: None   Collection Time: 06/07/16 11:21 AM  Result Value Ref Range Status   Specimen Description SPUTUM  Final   Special Requests Immunocompromised  Final   Sputum evaluation THIS SPECIMEN IS ACCEPTABLE FOR SPUTUM CULTURE  Final   Report Status 06/07/2016 FINAL  Final  Culture, respiratory (NON-Expectorated)     Status: None   Collection Time: 06/07/16 11:21 AM  Result Value Ref Range Status   Specimen Description SPUTUM  Final   Special Requests Immunocompromised Reflexed from [id number]  Final   Gram Stain   Final    MODERATE WBC PRESENT, PREDOMINANTLY PMN FEW SQUAMOUS EPITHELIAL CELLS PRESENT RARE GRAM POSITIVE COCCOBACILLUS RARE GRAM POSITIVE COCCI IN PAIRS RARE GRAM NEGATIVE COCCOBACILLI    Culture Consistent with normal respiratory flora.  Final   Report Status 06/09/2016 FINAL  Final  MRSA PCR Screening     Status: None   Collection Time: 06/07/16  3:05 PM  Result Value Ref Range Status   MRSA by PCR NEGATIVE NEGATIVE Final    Comment:        The GeneXpert MRSA Assay (FDA approved for NASAL specimens only), is  one component of a comprehensive MRSA colonization surveillance program. It is not intended to diagnose MRSA infection nor to guide or monitor treatment for MRSA infections.     Radiology Studies: No results found.  Scheduled Meds: . aspirin EC  81 mg Oral Daily  . atenolol  50 mg Oral Daily  . dabigatran  150 mg Oral Q12H  . diltiazem  240 mg Oral Daily  . rosuvastatin  10 mg Oral Daily  . sodium chloride flush  3 mL Intravenous Q12H  . sodium chloride flush  3 mL Intravenous Q12H   Continuous Infusions:  Faye Ramsay, MD  [facility] Pager [phone]  If 7PM-7AM, please contact night-coverage [email or web address] Password [facility] 06/14/2016, 12:57 PM

## 2016-06-15 ENCOUNTER — Telehealth (HOSPITAL_COMMUNITY): Payer: Self-pay

## 2016-06-15 ENCOUNTER — Inpatient Hospital Stay (HOSPITAL_COMMUNITY): Payer: Medicare HMO

## 2016-06-15 ENCOUNTER — Other Ambulatory Visit: Payer: Self-pay | Admitting: *Deleted

## 2016-06-15 DIAGNOSIS — I350 Nonrheumatic aortic (valve) stenosis: Secondary | ICD-10-CM

## 2016-06-15 DIAGNOSIS — I482 Chronic atrial fibrillation: Secondary | ICD-10-CM

## 2016-06-15 DIAGNOSIS — I5043 Acute on chronic combined systolic (congestive) and diastolic (congestive) heart failure: Secondary | ICD-10-CM

## 2016-06-15 DIAGNOSIS — Z01810 Encounter for preprocedural cardiovascular examination: Secondary | ICD-10-CM

## 2016-06-15 HISTORY — PX: TRANSTHORACIC ECHOCARDIOGRAM: SHX275

## 2016-06-15 LAB — VAS US CAROTID
LCCADDIAS: 6 cm/s
LCCADSYS: 30 cm/s
LEFT ECA DIAS: -2 cm/s
LEFT VERTEBRAL DIAS: -3 cm/s
LICADDIAS: -12 cm/s
LICAPDIAS: -11 cm/s
LICAPSYS: -38 cm/s
Left CCA prox dias: 11 cm/s
Left CCA prox sys: 49 cm/s
Left ICA dist sys: -48 cm/s
RCCAPSYS: 57 cm/s
RIGHT VERTEBRAL DIAS: 11 cm/s
Right CCA prox dias: 14 cm/s
Right cca dist sys: -84 cm/s

## 2016-06-15 LAB — CBC WITH DIFFERENTIAL/PLATELET
BASOS ABS: 0 10*3/uL (ref 0.0–0.1)
Basophils Relative: 0 %
EOS PCT: 3 %
Eosinophils Absolute: 0.4 10*3/uL (ref 0.0–0.7)
HCT: 33.8 % — ABNORMAL LOW (ref 39.0–52.0)
HEMOGLOBIN: 11.4 g/dL — AB (ref 13.0–17.0)
LYMPHS PCT: 7 %
Lymphs Abs: 1 10*3/uL (ref 0.7–4.0)
MCH: 31.8 pg (ref 26.0–34.0)
MCHC: 33.7 g/dL (ref 30.0–36.0)
MCV: 94.2 fL (ref 78.0–100.0)
Monocytes Absolute: 1.1 10*3/uL — ABNORMAL HIGH (ref 0.1–1.0)
Monocytes Relative: 8 %
NEUTROS PCT: 82 %
Neutro Abs: 11.7 10*3/uL — ABNORMAL HIGH (ref 1.7–7.7)
PLATELETS: 369 10*3/uL (ref 150–400)
RBC: 3.59 MIL/uL — AB (ref 4.22–5.81)
RDW: 12.4 % (ref 11.5–15.5)
WBC: 14.3 10*3/uL — AB (ref 4.0–10.5)

## 2016-06-15 MED ORDER — METOPROLOL TARTRATE 5 MG/5ML IV SOLN
INTRAVENOUS | Status: AC
  Administered 2016-06-15: 5 mg
  Filled 2016-06-15: qty 20

## 2016-06-15 MED ORDER — DOCUSATE SODIUM 100 MG PO CAPS: 100.0000 mg | ORAL_CAPSULE | Freq: Two times a day (BID) | ORAL | Status: DC

## 2016-06-15 MED ORDER — IOPAMIDOL (ISOVUE-370) INJECTION 76%
INTRAVENOUS | Status: AC
  Administered 2016-06-15: 50 mL
  Filled 2016-06-15: qty 50

## 2016-06-15 MED ORDER — DOCUSATE SODIUM 100 MG PO CAPS
100.0000 mg | ORAL_CAPSULE | Freq: Two times a day (BID) | ORAL | Status: DC
  Administered 2016-06-15 – 2016-06-27 (×21): 100 mg via ORAL
  Filled 2016-06-15 (×21): qty 1

## 2016-06-15 MED ORDER — FLEET ENEMA 7-19 GM/118ML RE ENEM
1.0000 | ENEMA | Freq: Once | RECTAL | Status: AC
  Administered 2016-06-15: 1 via RECTAL
  Filled 2016-06-15: qty 1

## 2016-06-15 MED ORDER — IOPAMIDOL (ISOVUE-370) INJECTION 76%
INTRAVENOUS | Status: AC
  Filled 2016-06-15: qty 100

## 2016-06-15 MED ORDER — DOCUSATE SODIUM 100 MG PO CAPS: 200.0000 mg | ORAL_CAPSULE | Freq: Once | ORAL | Status: DC

## 2016-06-15 MED ORDER — IOPAMIDOL (ISOVUE-370) INJECTION 76%
INTRAVENOUS | Status: AC
  Administered 2016-06-15: 100 mL
  Filled 2016-06-15: qty 100

## 2016-06-15 NOTE — Progress Notes (Addendum)
Per Dr.Nelson, called down to ct TO GIVE patient IV Lopressor. Verbal order 5 mg IV wait 3 minutes if heart rate not between 70-80 give again up to 4 doses.  Total of 20 mg IV Lopressor given , patient heart rate maintaining between 79-110. Dr. Meda Coffee aware.

## 2016-06-15 NOTE — Progress Notes (Addendum)
Progress Note  Patient Name: Genevieve Ritzel Date of Encounter: 06/15/2016  Primary Cardiologist: Dr. Ellyn Hack  Subjective   No chest pain.  Reports constipation.  Inpatient Medications    Scheduled Meds: . aspirin EC  81 mg Oral Daily  . atenolol  50 mg Oral Daily  . dabigatran  150 mg Oral Q12H  . diltiazem  240 mg Oral Daily  . docusate sodium  100 mg Oral BID  . rosuvastatin  10 mg Oral Daily  . sodium chloride flush  3 mL Intravenous Q12H  . sodium chloride flush  3 mL Intravenous Q12H   Continuous Infusions:  PRN Meds: sodium chloride, acetaminophen, ALPRAZolam, guaiFENesin-dextromethorphan, HYDROcodone-acetaminophen, levalbuterol, magnesium hydroxide, ondansetron **OR** ondansetron (ZOFRAN) IV, sodium chloride flush, witch hazel-glycerin   Vital Signs    Vitals:   06/14/16 1944 06/15/16 0315 06/15/16 0455 06/15/16 0843  BP: 115/67  111/81 (!) 100/59  Pulse: (!) 101  (!) 107   Resp: (!) 23  20   Temp: 98.8 F (37.1 C)  98.1 F (36.7 C) 99.3 F (37.4 C)  TempSrc: Oral  Oral Oral  SpO2: 100%  98% 97%  Weight:  177 lb 11.1 oz (80.6 kg) 177 lb 8 oz (80.5 kg)   Height:        Intake/Output Summary (Last 24 hours) at 06/15/16 0906 Last data filed at 06/15/16 0750  Gross per 24 hour  Intake              222 ml  Output              200 ml  Net               22 ml   Filed Weights   06/14/16 0535 06/15/16 0315 06/15/16 0455  Weight: 175 lb 1.6 oz (79.4 kg) 177 lb 11.1 oz (80.6 kg) 177 lb 8 oz (80.5 kg)    Telemetry    AFib - Personally Reviewed, increased rates this AM  ECG      Physical Exam   GEN: No acute distress.   Neck: No JVD Cardiac: Irregularly irregular, tachycardic, 3/6 systolic murmurs, no rubs, or gallops.  Respiratory: Clear to auscultation bilaterally. GI: Soft, nontender, non-distended  MS: No edema; No deformity. Neuro:  Nonfocal  Psych: Normal affect   Labs    Chemistry  Recent Labs Lab 06/11/16 0435 06/12/16 0257  06/13/16 0352  NA 124* 126* 130*  K 3.6 2.9* 4.1  CL 87* 88* 93*  CO2 _0 GLUCOSE 119* 115* 109*  BUN _1 CREATININE 0.70 0.65 0.69  CALCIUM 8.7* 8.6* 8.8*  GFRNONAA >60 >60 >60  GFRAA >60 >60 >60  ANIONGAP _2 Hematology  Recent Labs Lab 06/13/16 0352 06/14/16 0654 06/15/16 0410  WBC 9.4 12.6* 14.3*  RBC 3.42* 3.46* 3.59*  HGB 11.0* 11.2* 11.4*  HCT 32.0* 32.7* 33.8*  MCV 93.6 94.5 94.2  MCH 32.2 32.4 31.8  MCHC 34.4 34.3 33.7  RDW 12.3 12.5 12.4  PLT 310 335 369    Cardiac Enzymes No results for input(s): TROPONINI in the last 168 hours.  No results for input(s): TROPIPOC in the last 168 hours.   BNP No results for input(s): BNP, PROBNP in the last 168 hours.   DDimer No results for input(s): DDIMER in the last 168 hours.   Radiology    No results found.  Cardiac Studies   Severe AS by echo  Patient  Profile     83 y.o. male severe AS, CAD  Assessment & Plan    1) AS: he had a  R and L heart cath for TAVR eval. Met with Dr. Burt Knack yesterday regarding TAVR.  He will see a Psychologist, sport and exercise today.  He is wiling to have the procedure done.  2) AFib: Rate increased this AM.  Continue atenolol and Diltiazem.  May need to increase atenolol to 75 mg daily if HR stays elevated. Diuresed for volume overload during this hospital stay.  Chronic diastolic heart failure due to age and AFib.  Watch for volume overload.  He has some CTAs scheduled so will hold off on IV diuretics at this time to try to preserve renal function with contrast dye load.  3) CAD: No angina.  Hyponatremia: Recheck sodium tomorrow.  Lasix may help.  Signed, Larae Grooms, MD  06/15/2016, 9:06 AM

## 2016-06-15 NOTE — Progress Notes (Signed)
VASCULAR LAB PRELIMINARY  PRELIMINARY  PRELIMINARY  PRELIMINARY  Carotid duplex completed.    Preliminary report:  1-39% ICA plaquing. Vertebral artery flow is antegrade.   Anjanette Gilkey, RVT 06/15/2016, 3:07 PM

## 2016-06-15 NOTE — Progress Notes (Signed)
PROGRESS NOTE  Allard Timperley [id number] DOB: 06/02/1929 DOA: 06/07/2016   PCP: Merrilee Seashore, MD   LOS: 8 days   Brief Narrative: 83 y.o. male with medical history significant of atrial fibrillation on Pradaxa, CAD, CABG, hyperlipidemia, hypertension, moderate to severe aortic stenosis is coming to the emergency department with complaints of on/off chest pressure, radiates to his back, improved by rest for the past week associated with dyspnea, orthopnea and fatigue.  He was found to be hyponatremic in the ED.  He was felt to be dehydrated, and received gentle IV hydration, which resulted in improvement in his sodium however he developed fluid overload quite rapidly.  2D echo later showed critical aortic stenosis and cardiology was consulted, patient was started on IV Lasix, his sodium has improved and cardiology is currently evaluating for TAVR.  Assessment & Plan:  Hyponatremia - Patient was initially hypovolemic and his sodium improved with gentle hydration, however he developed fluid overload rather rapidly due to his critical aortic stenosis, with worsening hyponatremia.   - He required IV Lasix, with improvement in his respiratory status as well as improvement in his sodium levels. - Na overall improving, will check Na in AM  Chest pain - 2D echo done on 2/22 showed an ejection fraction of 45-50%, with mildly reduced systolic function, with diffuse hypokinesis and critical aortic valve stenosis.  Valve area was calculated at 0.26 cm.  Prior to the echo done in April 2017 showed normal EF of 0000000, normal systolic function, with a valve area of 0.55 - per cardiology may need TAVR, will follow up on recommendations  - surgery to see pt today   Acute on probable chronic systolic CHF - Suspect related to his aortic valve stenosis, very sensitive to fluids, chest x-ray with mild fluid overload, renal function has remained stable - management per cardiology   CAP - Completed 7 days  therapy with ABX  - still with worsening leukocytosis but no fever, will monitor for now - may need repeat CXR to ensure no worsening PNA  Acute encephalopathy - Patient with intermittent confusion, this is likely in the setting of infectious process, as well as probable delirium related to inpatient stay - still with intermittent confusion but appears to be stable at baseline   CAD (coronary artery disease) - Continue home medications, cardiology evaluating  Aortic stenosis, moderate >> severe  - Updated 2D echo as below, cards following, now echo reads the aortic stenosis is critical - pt is willing to proceed with surgery  - surgery to see pt today and will follow up on recommendations   Essential hypertension - Blood pressure controlled 113/68, continue current regimen  Dyslipidemia, goal LDL below 70 - Continue Crestor  Atrial fibrillation, permanent (HCC) - CHA2DS2-VASc Score of at least 4. - Continue atenolol for rate control, resume Pradaxa per cardiology   DVT prophylaxis: SCDs Code Status: Full code Family Communication: no family at bedside  Disposition Plan: remains inpatient  Consultants:   Cardiology  Nephrology  Procedures:   2D echo Study Conclusions - Left ventricle: The cavity size was normal. Wall thickness was increased in a pattern of mild LVH. Systolic function was normal. The estimated ejection fraction was in the range of 55% to 60%. Wall motion was normal; there were no regional wall motion abnormalities. - Aortic valve: Valve mobility was restricted. There was severe stenosis. There was moderate regurgitation. Peak velocity (S): 451 cm/s. Mean gradient (S): 43 mm Hg. - Mitral valve: Moderately calcified annulus. There was  mild regurgitation. - Left atrium: The atrium was moderately dilated. - Pulmonary arteries: Systolic pressure was mildly increased. PA peak pressure: 32 mm Hg (S).  Impressions: - Mean gradient 79mmHg from prior  echocardiogram. Aortic stenosis has increased.  Antimicrobials:  Ceftriaxone 2/21 >>   azithromycin 2/21 >>  Subjective: No events overnight.   Objective: Vitals:   06/15/16 0455 06/15/16 0843 06/15/16 1017 06/15/16 1248  BP: 111/81 (!) 100/59 106/66 113/68  Pulse: (!) 107  (!) 125 83  Resp: 20     Temp: 98.1 F (36.7 C) 99.3 F (37.4 C)  98.9 F (37.2 C)  TempSrc: Oral Oral  Oral  SpO2: 98% 97%  96%  Weight: 80.5 kg (177 lb 8 oz)     Height:        Intake/Output Summary (Last 24 hours) at 06/15/16 1402 Last data filed at 06/15/16 0750  Gross per 24 hour  Intake              222 ml  Output              200 ml  Net               22 ml   Filed Weights   06/14/16 0535 06/15/16 0315 06/15/16 0455  Weight: 79.4 kg (175 lb 1.6 oz) 80.6 kg (177 lb 11.1 oz) 80.5 kg (177 lb 8 oz)    Examination: Constitutional: NAD Vitals:   06/15/16 0455 06/15/16 0843 06/15/16 1017 06/15/16 1248  BP: 111/81 (!) 100/59 106/66 113/68  Pulse: (!) 107  (!) 125 83  Resp: 20     Temp: 98.1 F (36.7 C) 99.3 F (37.4 C)  98.9 F (37.2 C)  TempSrc: Oral Oral  Oral  SpO2: 98% 97%  96%  Weight: 80.5 kg (177 lb 8 oz)     Height:       Eyes: PERRL, lids and conjunctivae normal Respiratory: diminished breath sounds at bases   Cardiovascular: Irregular. No LE edema. 3/6 SEM Abdomen: no tenderness. Bowel sounds positive.  Musculoskeletal: no clubbing / cyanosis.  Neurologic: CN 2-12 grossly intact. Strength 5/5 in all 4.  Psychiatric: Alert and oriented 3  Data Reviewed: I have personally reviewed following labs and imaging studies  CBC:  Recent Labs Lab 06/11/16 0435 06/12/16 0257 06/13/16 0352 06/14/16 0654 06/15/16 0410  WBC 12.0* 10.3 9.4 12.6* 14.3*  NEUTROABS 10.0* 8.1* 6.9 10.0* 11.7*  HGB 10.2* 10.7* 11.0* 11.2* 11.4*  HCT 29.3* 30.8* 32.0* 32.7* 33.8*  MCV 91.3 91.9 93.6 94.5 94.2  PLT 239 289 310 335 0000000   Basic Metabolic Panel:  Recent Labs Lab 06/10/16 0447  06/10/16 1639 06/11/16 0435 06/12/16 0257 06/13/16 0352  NA 121* 122* 124* 126* 130*  K 4.3 4.2 3.6 2.9* 4.1  CL 89* 89* 87* 88* 93*  CO2 22 26 28 29 29   GLUCOSE 119* 131* 119* 115* 109*  BUN 17 17 15 11 11   CREATININE 0.68 0.71 0.70 0.65 0.69  CALCIUM 8.8* 8.9 8.7* 8.6* 8.8*   Coagulation Profile:  Recent Labs Lab 06/11/16 0435  INR 1.29    Recent Results (from the past 240 hour(s))  Blood culture (routine x 2)     Status: None   Collection Time: 06/07/16  2:18 AM  Result Value Ref Range Status   Specimen Description BLOOD RIGHT HAND  Final   Special Requests BOTTLES DRAWN AEROBIC AND ANAEROBIC 5ML  Final   Culture NO GROWTH 5 DAYS  Final  Report Status 06/12/2016 FINAL  Final  Blood culture (routine x 2)     Status: None   Collection Time: 06/07/16  2:35 AM  Result Value Ref Range Status   Specimen Description BLOOD LEFT ARM  Final   Special Requests BOTTLES DRAWN AEROBIC AND ANAEROBIC 5ML  Final   Culture NO GROWTH 5 DAYS  Final   Report Status 06/12/2016 FINAL  Final  Culture, sputum-assessment     Status: None   Collection Time: 06/07/16 11:21 AM  Result Value Ref Range Status   Specimen Description SPUTUM  Final   Special Requests Immunocompromised  Final   Sputum evaluation THIS SPECIMEN IS ACCEPTABLE FOR SPUTUM CULTURE  Final   Report Status 06/07/2016 FINAL  Final  Culture, respiratory (NON-Expectorated)     Status: None   Collection Time: 06/07/16 11:21 AM  Result Value Ref Range Status   Specimen Description SPUTUM  Final   Special Requests Immunocompromised Reflexed from [id number]  Final   Gram Stain   Final    MODERATE WBC PRESENT, PREDOMINANTLY PMN FEW SQUAMOUS EPITHELIAL CELLS PRESENT RARE GRAM POSITIVE COCCOBACILLUS RARE GRAM POSITIVE COCCI IN PAIRS RARE GRAM NEGATIVE COCCOBACILLI    Culture Consistent with normal respiratory flora.  Final   Report Status 06/09/2016 FINAL  Final  MRSA PCR Screening     Status: None   Collection Time: 06/07/16   3:05 PM  Result Value Ref Range Status   MRSA by PCR NEGATIVE NEGATIVE Final    Comment:        The GeneXpert MRSA Assay (FDA approved for NASAL specimens only), is one component of a comprehensive MRSA colonization surveillance program. It is not intended to diagnose MRSA infection nor to guide or monitor treatment for MRSA infections.     Radiology Studies: No results found.  Scheduled Meds: . aspirin EC  81 mg Oral Daily  . atenolol  50 mg Oral Daily  . dabigatran  150 mg Oral Q12H  . diltiazem  240 mg Oral Daily  . docusate sodium  100 mg Oral BID  . iopamidol      . rosuvastatin  10 mg Oral Daily  . sodium chloride flush  3 mL Intravenous Q12H  . sodium chloride flush  3 mL Intravenous Q12H   Continuous Infusions:  Faye Ramsay, MD  [facility] Pager [phone]  If 7PM-7AM, please contact night-coverage [email or web address] Password [email or web address] 06/15/2016, 2:02 PM

## 2016-06-15 NOTE — Progress Notes (Signed)
Patient ID: Richard Simon, male   DOB: [birth year], 83 y.o.   MRN: [id number]      [address]       [place] [address]             [phone]        Dejaun Gemme Aulander Medical Record [id number] Date of Birth: Dec 11, 1929  Referring:Dr Burt Knack Primary Care: Merrilee Seashore, MD  Chief Complaint:    Chief Complaint  Patient presents with  . Chest Pain  . Shortness of Breath    History of Present Illness:    Asked to see patient known to me from 14 years ago when he undergone coronary artery bypass graft. He now presents with severe aortic stenosis. 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 He is known to have a history of permanent atrial fibrillation, coronary artery disease status post multivessel CABG in 2004. He is been followed recently for known aortic stenosis. He is chronically anticoagulated with Pradaxa.   The patient was admitted February 21 with progressive exertional dyspnea, orthopnea, and generalized fatigue. He noted chest tightness radiating to the upper back.  On admission he was noted to have hyponatremia, elevated BNP, and bilateral pulmonary infiltrates. An echocardiogram demonstrated critical aortic stenosis with a mean gradient of 52 mmHg, calculated valve area of 0.3 cm, and reduction in LV function with an LVEF of 45-50%. Cardiac catheterization demonstrated severe native vessel CAD with patency of his bypass grafts.  Patient was seen appeared mildly confused but did remember who I was from 14 years ago.  There are no family members present. The patient cares for his wife who is nearly bed bound.     Current Activity/ Functional Status: Patient is independent with mobility/ambulation, transfers, ADL's, IADL's.   Zubrod Score: At the time of surgery this patient's most appropriate activity status/level should be described as: []     0    Normal activity, no symptoms [x]     1     Restricted in physical strenuous activity but ambulatory, able to do out light work []     2    Ambulatory and capable of self care, unable to do work activities, up and about                 more than 50%  Of the time                            []     3    Only limited self care, in bed greater than 50% of waking hours []     4    Completely disabled, no self care, confined to bed or chair []     5    Moribund  Past Medical History:  Diagnosis Date  . Atrial fibrillation, permanent ([place])    Rate controlled. Anticoagulation with Pradaxa.  [facility] Kitchen CAD (coronary artery disease) 2004   S/P 4-vessel CABG in 2004  . Dyslipidemia, goal LDL below 70   . Hypertension     very well controlled  . Moderate to severe aortic stenosis 01/2013; 06/2014   Echo Eval: a) 01/2013: Mod calcified AoV w/ reduced mobility of R cusp. Mod-Severe stenosis. Mn-Pk gradient 25 mmHg--39 mmHg. AVA ~ 0.79-0.83 cm. Moderate MR. Normal LV size and function EF 55-60%. Elevated LAP. Mild pulmonary attention. Mild moderately dilated left atrium.;; b) 06/2014: EF 60-65%. Mod-Severe AS, AVA~0.69, Mn-Pk gradient 32 mmHg --  55 mmHg, severe LA dilation, moderate-severe RA d  . S/P CABG x 4 2004   Post CABG    Past Surgical History:  Procedure Laterality Date  . CARDIAC CATHETERIZATION  12/11/2002   Recommendation-CABG; 80% mid LAD, 70% distal LAD. Mid circumflex 95%, OM1 95%. AV groove circumflex 95%. Proximal RCA 100%  . CORONARY ARTERY BYPASS GRAFT  12/15/2002   LIMA-LAD, SVG-diagonal, SVG-OM1-OM2 sequential  [facility] Adam MYOVIEW  December 2012   Fixed basal inferior diaphragmatic attenuation versusinfarct, no ischemia  . RIGHT HEART CATH AND CORONARY/GRAFT ANGIOGRAPHY N/A 06/13/2016   Procedure: Right Heart Cath and Coronary/Graft Angiography;  Surgeon: Leonie Man, MD;  Location: [facility];  Service: Cardiovascular;  Laterality: N/A;  . TRANSTHORACIC ECHOCARDIOGRAM  September 2013   Normal EF. > 55%; moderate aortic valve  stenosis with mild AI. Gradient: Peak 40 mmHg, mean 24 military. Moderate LA dilation. Moderate TR with RV pressure 30-40 mmHg. Mild MR.  . TRANSTHORACIC ECHOCARDIOGRAM  October 2014   Moderate-severe AS with mild AI. Peak and mean gradients no significant change: 39 mmHg and 25 mmHg;; mild LV dilation with EF 55-60%. Elevated LAP. Moderate MR. Mild to moderate LA dilation. Mildly elevated RV pressure.  . TRANSTHORACIC ECHOCARDIOGRAM  March 2016   EF 60-65%. Mod-Severe AS, AVA~0.69, Mn-Pk gradient 32 mmHg -- 55 mmHg, severe LA dilation, moderate-severe RA dilation: Progression of disease    History  Smoking Status  . Former Smoker  . Quit date: 02/12/1975  Smokeless Tobacco  . Never Used    History  Alcohol Use  . 8.4 oz/week  . 14 Glasses of wine per week    Social History   Social History  . Marital status: Married    Spouse name: N/A  . Number of children: N/A  . Years of education: N/A   Occupational History  . Not on file.   Social History Main Topics  . Smoking status: Former Smoker    Quit date: 02/12/1975  . Smokeless tobacco: Never Used  . Alcohol use 8.4 oz/week    14 Glasses of wine per week  . Drug use: Unknown  . Sexual activity: Not on file   Other Topics Concern  . Not on file   Social History Narrative   Married. Father of 38, grandfather 3, great-grandfather 3.   Very active, but without routine exercise. Does yard work and intermittent walking, but nothing routine.   Does not smoke. Occasional alcohol.    No Known Allergies  Current Facility-Administered Medications  Medication Dose Route Frequency Provider Last Rate Last Dose  . 0.9 %  sodium chloride infusion  250 mL Intravenous PRN Leonie Man, MD      . acetaminophen (TYLENOL) tablet 650 mg  650 mg Oral Q6H PRN Reubin Milan, MD   650 mg at 06/14/16 1411  . ALPRAZolam Duanne Moron) tablet 0.25 mg  0.25 mg Oral QHS PRN Rise Patience, MD   0.25 mg at 06/15/16 0003  . aspirin EC  tablet 81 mg  81 mg Oral Daily Reubin Milan, MD   81 mg at 06/15/16 1017  . atenolol (TENORMIN) tablet 50 mg  50 mg Oral Daily Reubin Milan, MD   50 mg at 06/15/16 1017  . dabigatran (PRADAXA) capsule 150 mg  150 mg Oral Q12H Leonie Man, MD   150 mg at 06/15/16 1017  . diltiazem (CARDIZEM CD) 24 hr capsule 240 mg  240 mg Oral Daily Reubin Milan, MD  240 mg at 06/15/16 1018  . docusate sodium (COLACE) capsule 100 mg  100 mg Oral BID Rhetta Mura Schorr, NP   100 mg at 06/15/16 1017  . guaiFENesin-dextromethorphan (ROBITUSSIN DM) 100-10 MG/5ML syrup 5 mL  5 mL Oral Q4H PRN Caren Griffins, MD   5 mL at 06/11/16 1733  . HYDROcodone-acetaminophen (NORCO/VICODIN) 5-325 MG per tablet 1-2 tablet  1-2 tablet Oral Q4H PRN Jeryl Columbia, NP   2 tablet at 06/15/16 0119  . iopamidol (ISOVUE-370) 76 % injection           . levalbuterol (XOPENEX) nebulizer solution 0.63 mg  0.63 mg Nebulization Q6H PRN Caren Griffins, MD   0.63 mg at 06/11/16 2223  . magnesium hydroxide (MILK OF MAGNESIA) suspension 30 mL  30 mL Oral Daily PRN Caren Griffins, MD   30 mL at 06/14/16 1411  . ondansetron (ZOFRAN) tablet 4 mg  4 mg Oral Q6H PRN Reubin Milan, MD       Or  . ondansetron [facility]) injection 4 mg  4 mg Intravenous Q6H PRN Reubin Milan, MD   4 mg at 06/10/16 1043  . rosuvastatin (CRESTOR) tablet 10 mg  10 mg Oral Daily Reubin Milan, MD   10 mg at 06/15/16 1017  . sodium chloride flush (NS) 0.9 % injection 3 mL  3 mL Intravenous Q12H Reubin Milan, MD   3 mL at 06/15/16 1018  . sodium chloride flush (NS) 0.9 % injection 3 mL  3 mL Intravenous Q12H Leonie Man, MD   3 mL at 06/15/16 1018  . sodium chloride flush (NS) 0.9 % injection 3 mL  3 mL Intravenous PRN Leonie Man, MD      . witch hazel-glycerin (TUCKS) pad   Topical PRN Theodis Blaze, MD        Prescriptions Prior to Admission  Medication Sig Dispense Refill Last Dose  . Acetaminophen 500 MG  coapsule Take 1 tablet by mouth every 6 (six) hours as needed for pain.    unk  . aspirin EC 81 MG tablet Take 81 mg by mouth daily.   06/06/2016  . atenolol (TENORMIN) 50 MG tablet Take 50 mg by mouth daily.   06/05/2016 at unk  . CARTIA XT 240 MG 24 hr capsule TAKE 1 CAPSULE EVERY DAY 90 capsule 2 06/05/2016  . co-enzyme Q-10 30 MG capsule Take 1 capsule by mouth daily.   Past Month at Unknown time  . dabigatran (PRADAXA) 150 MG CAPS capsule Take 1 capsule (150 mg total) by mouth 2 (two) times daily. 180 capsule 3 06/05/2016  . lisinopril (PRINIVIL,ZESTRIL) 10 MG tablet Take 1 tablet by mouth at bedtime.   06/05/2016  . Omega-3 Fatty Acids (FISH OIL) 1200 MG CAPS Take 1,200 mg by mouth 2 (two) times daily.   06/05/2016  . rosuvastatin (CRESTOR) 10 MG tablet TAKE 1 TABLET (10 MG TOTAL) BY MOUTH DAILY. 90 tablet 0 06/06/2016 at Unknown time    Family History  Problem Relation Age of Onset  . Hip fracture Mother      Review of Systems:      Cardiac Review of Systems: Y or N  Chest Pain [    ]  Resting SOB [   ] Exertional SOB  [  ]  Orthopnea [  ]   Pedal Edema [   ]    Palpitations [  ] Syncope  [  ]   Presyncope [   ]  General Review of Systems: [Y] = yes [  ]=no Constitional: recent weight change [  ]; anorexia [  ]; fatigue [  ]; nausea [  ]; night sweats [  ]; fever [  ]; or chills [  ]                                                               Dental: poor dentition[  ]; Last Dentist visit:   Eye : blurred vision [  ]; diplopia [   ]; vision changes [  ];  Amaurosis fugax[  ]; Resp: cough [  ];  wheezing[  ];  hemoptysis[  ]; shortness of breath[  ]; paroxysmal nocturnal dyspnea[  ]; dyspnea on exertion[  ]; or orthopnea[  ];  GI:  gallstones[  ], vomiting[  ];  dysphagia[  ]; melena[  ];  hematochezia [  ]; heartburn[  ];   Hx of  Colonoscopy[  ]; GU: kidney stones [  ]; hematuria[  ];   dysuria [  ];  nocturia[  ];  history of     obstruction [  ]; urinary frequency [  ]              Skin: rash, swelling[  ];, hair loss[  ];  peripheral edema[  ];  or itching[  ]; Musculosketetal: myalgias[  ];  joint swelling[  ];  joint erythema[  ];  joint pain[  ];  back pain[  ];  Heme/Lymph: bruising[  ];  bleeding[  ];  anemia[  ];  Neuro: TIA[  ];  headaches[  ];  stroke[  ];  vertigo[  ];  seizures[  ];   paresthesias[  ];  difficulty walking[  ];  Psych:depression[  ]; anxiety[  ];  Endocrine: diabetes[  ];  thyroid dysfunction[  ];  Immunizations: Flu [  ]; Pneumococcal[  ];  Other:  Physical Exam: BP 113/68 (BP Location: Right Arm)   Pulse 83   Temp 98.9 F (37.2 C) (Oral)   Resp 20   Ht 5\' 9"  (1.753 m)   Wt 177 lb 8 oz (80.5 kg)   SpO2 96%   BMI 26.21 kg/m    General appearance: alert, cooperative, appears stated age and no distress Head: Normocephalic, without obvious abnormality, atraumatic Neck: no adenopathy, no carotid bruit, no JVD, supple, symmetrical, trachea midline and thyroid not enlarged, symmetric, no tenderness/mass/nodules Lymph nodes: Cervical, supraclavicular, and axillary nodes normal. Resp: diminished breath sounds bibasilar Back: symmetric, no curvature. ROM normal. No CVA tenderness. Cardio: irregularly irregular rhythm and systolic murmur: early systolic 2/6, crescendo at 2nd left intercostal space GI: soft, non-tender; bowel sounds normal; no masses,  no organomegaly Extremities: extremities normal, atraumatic, no cyanosis or edema and Homans sign is negative, no sign of DVT Neurologic: Grossly normal  Diagnostic Studies & Laboratory data:     Recent Radiology Findings:   No results found.   I have independently reviewed the above radiologic studies.  Recent Lab Findings: Lab Results  Component Value Date   WBC 14.3 (H) 06/15/2016   HGB 11.4 (L) 06/15/2016   HCT 33.8 (L) 06/15/2016   PLT 369 06/15/2016   GLUCOSE 109 (H) 06/13/2016   CHOL 150 12/28/2010   TRIG 45 12/28/2010  HDL 56 12/28/2010   LDLCALC 85 12/28/2010   ALT 15  12/27/2010   AST 19 12/27/2010   NA 130 (L) 06/13/2016   K 4.1 06/13/2016   CL 93 (L) 06/13/2016   CREATININE 0.69 06/13/2016   BUN 11 06/13/2016   CO2 29 06/13/2016   TSH 1.319 06/09/2016   INR 1.29 06/11/2016   HGBA1C 5.7 (H) 12/28/2010   Echo 06-08-2016: Left ventricle: The cavity size was normal. Systolic function was mildly reduced. The estimated ejection fraction was in the range of 45% to 50%. Diffuse hypokinesis. The study was not technically sufficient to allow evaluation of LV diastolic dysfunction due to atrial fibrillation.  ------------------------------------------------------------------- Aortic valve: Poorly visualized. Severely calcified annulus. Trileaflet; severely thickened, severely calcified leaflets. Valve mobility was restricted. Doppler: There was critical stenosis. There was moderate regurgitation. VTI ratio of LVOT to aortic valve: 0.11. Valve area (VTI): 0.26 cm^2. Indexed valve area (VTI): 0.13 cm^2/m^2. Peak velocity ratio of LVOT to aortic valve: 0.14. Valve area (Vmax): 0.32 cm^2. Indexed valve area (Vmax): 0.16 cm^2/m^2. Mean velocity ratio of LVOT to aortic valve: 0.11. Valve area (Vmean): 0.26 cm^2. Indexed valve area (Vmean): 0.12 cm^2/m^2. Mean gradient (S): 52 mm Hg. Peak gradient (S): 79 mm Hg.  ------------------------------------------------------------------- Aorta: Aortic root: The aortic root was normal in size.  ------------------------------------------------------------------- Mitral valve: Moderately calcified annulus. Mobility was not restricted. Doppler: Transvalvular velocity was within the normal range. There was no evidence for stenosis. There was mild regurgitation.  ------------------------------------------------------------------- Left atrium: The atrium was mildly dilated.  ------------------------------------------------------------------- Atrial septum: No defect or patent foramen ovale was  identified by color flow Doppler.  ------------------------------------------------------------------- Right ventricle: The cavity size was normal. Wall thickness was normal. Systolic function was normal.  ------------------------------------------------------------------- Pulmonic valve: Structurally normal valve. Cusp separation was normal. Doppler: Transvalvular velocity was within the normal range. There was no evidence for stenosis. There was no regurgitation.  ------------------------------------------------------------------- Tricuspid valve: Structurally normal valve. Doppler: Transvalvular velocity was within the normal range. There was trivial regurgitation.  ------------------------------------------------------------------- Pulmonary artery: The main pulmonary artery was normal-sized. Systolic pressure was severely increased.  ------------------------------------------------------------------- Right atrium: The atrium was mildly dilated.  ------------------------------------------------------------------- Pericardium: There was no pericardial effusion.  ------------------------------------------------------------------- Systemic veins: Inferior vena cava: The vessel was dilated. The respirophasic diameter changes were blunted (< 50%), consistent with elevated central venous pressure.  ------------------------------------------------------------------- Measurements  Left ventricle Value Reference LV ID, ED, PLAX chordal 50 mm 43 - 52 LV ID, ES, PLAX chordal 37 mm 23 - 38 LV fx shortening, PLAX chordal (L) 26 % >=29 LV PW thickness, ED 10 mm ---------- IVS/LV PW ratio, ED 0.9 <=1.3 Stroke volume, 2D 30 ml ---------- Stroke volume/bsa,  2D 15 ml/m^2 ---------- LV ejection fraction, 1-p A4C 41 % ---------- LV end-diastolic volume, 2-p [id number] ml ---------- LV end-systolic volume, 2-p 74 ml ---------- LV ejection fraction, 2-p 40 % ---------- Stroke volume, 2-p 49 ml ---------- LV end-diastolic volume/bsa, 2-p 60 ml/m^2 ---------- LV end-systolic volume/bsa, 2-p 36 ml/m^2 ---------- Stroke volume/bsa, 2-p 23.8 ml/m^2 ----------  Ventricular septum Value Reference IVS thickness, ED 9 mm ----------  LVOT Value Reference LVOT ID, S 17 mm ---------- LVOT area 2.27 cm^2 ---------- LVOT peak velocity, S 62 cm/s ---------- LVOT mean velocity, S 39.4 cm/s ---------- LVOT VTI, S 13.1 cm ---------- LVOT peak gradient, S 2 mm Hg ----------  Aortic valve Value Reference Aortic valve peak velocity, S 444 cm/s ---------- Aortic valve mean velocity, S 349 cm/s ---------- Aortic valve VTI, S 114  cm ---------- Aortic mean gradient, S 52 mm Hg ---------- Aortic peak gradient, S 79 mm Hg ---------- VTI ratio, LVOT/AV 0.11 ---------- Aortic valve area, VTI 0.26 cm^2 ---------- Aortic valve area/bsa, VTI 0.13 cm^2/m^2 ---------- Velocity ratio, peak, LVOT/AV 0.14 ---------- Aortic valve area, peak  velocity 0.32 cm^2 ---------- Aortic valve area/bsa, peak 0.16 cm^2/m^2 ---------- velocity Velocity ratio, mean, LVOT/AV 0.11 ---------- Aortic valve area, mean velocity 0.26 cm^2 ---------- Aortic valve area/bsa, mean 0.12 cm^2/m^2 ---------- velocity Aortic regurg pressure half-time 230 ms ----------  Aorta Value Reference Aortic root ID, ED 33 mm ----------  Left atrium Value Reference LA ID, A-P, ES 46 mm ---------- LA ID/bsa, A-P (H) 2.23 cm/m^2 <=2.2 LA volume, S 68.8 ml ---------- LA volume/bsa, S 33.3 ml/m^2 ---------- LA volume, ES, 1-p A4C 69.8 ml ---------- LA volume/bsa, ES, 1-p A4C 33.8 ml/m^2 ---------- LA volume, ES, 1-p A2C 67.5 ml ---------- LA volume/bsa, ES, 1-p A2C 32.7 ml/m^2 ----------  Mitral valve Value Reference Mitral deceleration time (L) 148 ms 150 - 230  Pulmonary arteries Value Reference PA pressure, S, DP (H) 63 mm Hg <=30  Tricuspid valve Value Reference Tricuspid regurg peak velocity 346 cm/s ---------- Tricuspid peak RV-RA gradient 48 mm Hg ----------  Right atrium Value Reference RA ID, S-I, ES, A4C (H) 63.7 mm 34 - 49 RA area, ES, A4C (H) 22.5 cm^2 8.3 - 19.5 RA volume, ES, A/L 64.3 ml ---------- RA volume/bsa, ES, A/L  31.2 ml/m^2 ----------  Systemic veins Value Reference Estimated CVP 15 mm Hg ----------  Right ventricle Value Reference TAPSE 13.5 mm ---------- RV pressure, S, DP (H) 63 mm Hg <=30 RV s&', lateral, S 6.64 cm/s ----------  Cardiac Cath 06-13-2016: Conclusion     Echocardiographic evidence of Severe/Critical Aortic Stenosis - now symptomatic  Hemodynamic findings consistent with mild secondary pulmonary hypertension.  Mid LAD lesion, 70 %stenosed. Dist LAD lesion, 80 %stenosed.  LIMA-dLAD is normal in caliber and anatomically normal.  Ost 2nd Diag to 2nd Diag lesion, 95 %stenosed.  SVG-Diag2 is normal in caliber and anatomically normal.  Ost 1st Mrg to 1st Mrg lesion, 100 %stenosed.  Mid Cx lesion, 100 %stenosed.  Seq SVG- OM1-LPL(dCx) and is large and anatomically normal.  Small nondominant RCA patent  4/4 widely patent grafts with severe native coronary disease  Mildly elevated PA pressures with elevated PCWP.  Plan:  Return to nursing unit for ongoing care and management per primary service as well as cardiology consultation team.  TR band removal per protocol  CT surgery has been consulted for evaluation for possible aortic valve replacement (likley TAVR)   I have independently reviewed the above  cath films and reviewed the findings with the  patient .  STS Risk Calculator: Risk of Mortality: 3.895%  Morbidity or Mortality: 21.848%  Long Length of Stay: 9.756%  Short Length of Stay: 22.661%  Permanent Stroke: 1.87%  Prolonged Ventilation: 14.803%  DSW Infection: 0.187%  Renal Failure: 5.425%  Reoperation: 8.637%     Assessment / Plan:     Critical symptomatic aortic stenosis presenting with acute on chronic mixed systolic  and diastolic heart failure - functional capacity IV objective assessment D with previous coronary artery bypass grafting and patent grafts. With the patient's critical aortic stenosis and symptoms and now a valve area of 0.3 cm and mean gradient of 54 caliber offers the best reasonable approach for this elderly male who has been functional. With his advanced age permanent atrial fibrillation previous coronary artery bypass grafting conventional  surgical aortic valve replacement would carry a very high risk for him and difficult postop rehabilitation.  I agree with proceeding with preoperative scans to investigate possibilities of TAVR including vascular access.   I  spent 40 minutes counseling the patient face to face and 50% or more the  time was spent in counseling and coordination of care. The total time spent in the appointment was 60 minutes.    Grace Isaac MD      [address] [place] [address] Office [phone]   Beeper [phone]  06/15/2016 5:50 PM

## 2016-06-15 NOTE — Progress Notes (Signed)
Patient complaining of constipation.  Patient has been back and forth to the bathroom multiple times this shift attempting to have a bowel movement.  Per day shift charting patient did have a bowel movement x2 on 06/14/2016.  Per charting patient had a medium bowel movement and a small bowel movement.  Patient is alert with confusion and can be forgetful (baseline this admission).  RN explained to patient that he did have a bowel movement during the day and patient adamant he did not.  RN text paged [facility], orders entered per [facility] on call.  RN offered ordered enema and patient stated he wanted to wait until the day time.

## 2016-06-15 NOTE — Telephone Encounter (Signed)
Encounter complete. 

## 2016-06-16 ENCOUNTER — Inpatient Hospital Stay (HOSPITAL_COMMUNITY): Payer: Medicare HMO

## 2016-06-16 DIAGNOSIS — I5032 Chronic diastolic (congestive) heart failure: Secondary | ICD-10-CM

## 2016-06-16 DIAGNOSIS — I5043 Acute on chronic combined systolic (congestive) and diastolic (congestive) heart failure: Secondary | ICD-10-CM

## 2016-06-16 DIAGNOSIS — I482 Chronic atrial fibrillation: Secondary | ICD-10-CM

## 2016-06-16 DIAGNOSIS — I350 Nonrheumatic aortic (valve) stenosis: Secondary | ICD-10-CM

## 2016-06-16 LAB — CBC WITH DIFFERENTIAL/PLATELET
BASOS ABS: 0 10*3/uL (ref 0.0–0.1)
BASOS PCT: 0 %
EOS PCT: 4 %
Eosinophils Absolute: 0.5 10*3/uL (ref 0.0–0.7)
HEMATOCRIT: 32.4 % — AB (ref 39.0–52.0)
Hemoglobin: 10.8 g/dL — ABNORMAL LOW (ref 13.0–17.0)
Lymphocytes Relative: 12 %
Lymphs Abs: 1.5 10*3/uL (ref 0.7–4.0)
MCH: 31.9 pg (ref 26.0–34.0)
MCHC: 33.3 g/dL (ref 30.0–36.0)
MCV: 95.6 fL (ref 78.0–100.0)
MONO ABS: 0.8 10*3/uL (ref 0.1–1.0)
Monocytes Relative: 7 %
NEUTROS ABS: 10 10*3/uL — AB (ref 1.7–7.7)
Neutrophils Relative %: 77 %
PLATELETS: 349 10*3/uL (ref 150–400)
RBC: 3.39 MIL/uL — AB (ref 4.22–5.81)
RDW: 12.8 % (ref 11.5–15.5)
WBC: 12.9 10*3/uL — AB (ref 4.0–10.5)

## 2016-06-16 LAB — BASIC METABOLIC PANEL
Anion gap: 9 (ref 5–15)
BUN: 7 mg/dL (ref 6–20)
CALCIUM: 8.9 mg/dL (ref 8.9–10.3)
CO2: 26 mmol/L (ref 22–32)
Chloride: 98 mmol/L — ABNORMAL LOW (ref 101–111)
Creatinine, Ser: 0.67 mg/dL (ref 0.61–1.24)
Glucose, Bld: 106 mg/dL — ABNORMAL HIGH (ref 65–99)
Potassium: 3.8 mmol/L (ref 3.5–5.1)
Sodium: 133 mmol/L — ABNORMAL LOW (ref 135–145)

## 2016-06-16 LAB — PULMONARY FUNCTION TEST
FEF 25-75 PRE: 1.82 L/s
FEF2575-%Pred-Pre: 104 %
FEV1-%Pred-Pre: 58 %
FEV1-Pre: 1.55 L
FEV1FVC-%PRED-PRE: 133 %
FEV6-%Pred-Pre: 46 %
FEV6-Pre: 1.64 L
FEV6FVC-%PRED-PRE: 107 %
FVC-%Pred-Pre: 42 %
FVC-Pre: 1.64 L
PRE FEV1/FVC RATIO: 95 %
PRE FEV6/FVC RATIO: 100 %

## 2016-06-16 MED ORDER — FUROSEMIDE 10 MG/ML IJ SOLN
40.0000 mg | Freq: Once | INTRAMUSCULAR | Status: AC
  Administered 2016-06-16: 40 mg via INTRAVENOUS
  Filled 2016-06-16: qty 4

## 2016-06-16 MED ORDER — HEPARIN (PORCINE) IN NACL 100-0.45 UNIT/ML-% IJ SOLN
1500.0000 [IU]/h | INTRAMUSCULAR | Status: DC
  Administered 2016-06-16: 1200 [IU]/h via INTRAVENOUS
  Administered 2016-06-19: 1500 [IU]/h via INTRAVENOUS
  Filled 2016-06-16 (×5): qty 250

## 2016-06-16 MED ORDER — ATENOLOL 25 MG PO TABS
75.0000 mg | ORAL_TABLET | Freq: Every day | ORAL | Status: DC
  Administered 2016-06-16 – 2016-06-21 (×6): 75 mg via ORAL
  Filled 2016-06-16 (×6): qty 3

## 2016-06-16 NOTE — Progress Notes (Addendum)
Progress Note  Patient Name: Richard Simon Date of Encounter: 06/16/2016  Primary Cardiologist: Dr. Ellyn Hack  Subjective   No complaints this morning. Remains on O2  Inpatient Medications    Scheduled Meds: . aspirin EC  81 mg Oral Daily  . atenolol  50 mg Oral Daily  . dabigatran  150 mg Oral Q12H  . diltiazem  240 mg Oral Daily  . docusate sodium  100 mg Oral BID  . rosuvastatin  10 mg Oral Daily  . sodium chloride flush  3 mL Intravenous Q12H  . sodium chloride flush  3 mL Intravenous Q12H   Continuous Infusions:  PRN Meds: sodium chloride, acetaminophen, ALPRAZolam, guaiFENesin-dextromethorphan, HYDROcodone-acetaminophen, levalbuterol, magnesium hydroxide, ondansetron **OR** ondansetron (ZOFRAN) IV, sodium chloride flush, witch hazel-glycerin   Vital Signs    Vitals:   06/15/16 1017 06/15/16 1248 06/15/16 2012 06/16/16 0500  BP: 106/66 113/68 105/61 109/74  Pulse: (!) 125 83 70 75  Resp:      Temp:  98.9 F (37.2 C) 98.8 F (37.1 C) 98.4 F (36.9 C)  TempSrc:  Oral Oral Oral  SpO2:  96% 99% 94%  Weight:    186 lb 9.6 oz (84.6 kg)  Height:        Intake/Output Summary (Last 24 hours) at 06/16/16 0815 Last data filed at 06/16/16 0300  Gross per 24 hour  Intake              480 ml  Output              500 ml  Net              -20 ml   Filed Weights   06/15/16 0315 06/15/16 0455 06/16/16 0500  Weight: 177 lb 11.1 oz (80.6 kg) 177 lb 8 oz (80.5 kg) 186 lb 9.6 oz (84.6 kg)    Telemetry    AFib rates elevated 120s-130s - Personally Reviewed, increased rates this AM  ECG    N/A  Physical Exam   GEN: No acute distress.   Neck: No JVD Cardiac: Irregularly irregular, tachycardic, 3/6 systolic murmurs, no rubs, or gallops.  Respiratory: Crackles bilaterally. GI: Soft, nontender, non-distended  MS: No edema; No deformity. Neuro:  Nonfocal Psych: Normal affect   Labs    Chemistry  Recent Labs Lab 06/12/16 0257 06/13/16 0352 06/16/16 0437  NA  126* 130* 133*  K 2.9* 4.1 3.8  CL 88* 93* 98*  CO2 '29 29 26  ' GLUCOSE 115* 109* 106*  BUN '11 11 7  ' CREATININE 0.65 0.69 0.67  CALCIUM 8.6* 8.8* 8.9  GFRNONAA >60 >60 >60  GFRAA >60 >60 >60  ANIONGAP '9 8 9     ' Hematology  Recent Labs Lab 06/14/16 0654 06/15/16 0410 06/16/16 0437  WBC 12.6* 14.3* 12.9*  RBC 3.46* 3.59* 3.39*  HGB 11.2* 11.4* 10.8*  HCT 32.7* 33.8* 32.4*  MCV 94.5 94.2 95.6  MCH 32.4 31.8 31.9  MCHC 34.3 33.7 33.3  RDW 12.5 12.4 12.8  PLT 335 369 349    Cardiac Enzymes No results for input(s): TROPONINI in the last 168 hours.  No results for input(s): TROPIPOC in the last 168 hours.   BNP No results for input(s): BNP, PROBNP in the last 168 hours.   DDimer No results for input(s): DDIMER in the last 168 hours.   Radiology    Ct Cardiac Morph/pulm Vein W/cm&w/o Ca Score  Result Date: 06/15/2016 CLINICAL DATA:  83 year old male with severe aortic stenosis. EXAM: Cardiac  TAVR CT TECHNIQUE: The patient was scanned on a Philips 256 scanner. A 120 kV retrospective scan was triggered in the descending thoracic aorta at 111 HU's. Gantry rotation speed was 270 msecs and collimation was .9 mm. 20 mg of iv Metoprolol and no nitro were given. The 3D data set was reconstructed in 5% intervals of the R-R cycle. Systolic and diastolic phases were analyzed on a dedicated work station using MPR, MIP and VRT modes. The patient received 80 cc of contrast. FINDINGS: Aortic Valve: Trileaflet, severely calcified and thickened with severely restricted leaflet opening and minimal calcifications extending into the LVOT. Aorta:  Normal size, no dissection.  Mild diffuse calcifications. Sinotubular Junction:  30 x 30 mm Ascending Thoracic Aorta:  33 x 32 mm Aortic Arch:  24 x 24 mm Descending Thoracic Aorta:  24 x 24 mm Sinus of Valsalva Measurements: Non-coronary:  37 mm Right -coronary:  33 mm Left -coronary:  34 mm Coronary Artery Height above Annulus: Left Main:  16 mm Right  Coronary:  24 mm Virtual Basal Annulus Measurements: Maximum/Minimum Diameter:  32 x 22 mm Perimeter:  94 mm Area:  596 mm2 Optimum Fluoroscopic Angle for Delivery:  RAO 2 CRA 1 Dilated pulmonary artery measuring 32 x 29 mm No thrombus in the left atrial appendage. Normal pulmonary vein drainage into the left atrium. No ASD/VSD seen. IMPRESSION: 1. Trileaflet, severely calcified and thickened with severely restricted leaflet opening and minimal calcifications extending into the LVOT. Annular measurements suitable for delivery of a 29 mm Edwards-SAPIEN 3 TAVR valve. 2. Optimum Fluoroscopic Angle for Delivery:  RAO 2 CRA 1 Ena Dawley Electronically Signed   By: Ena Dawley   On: 06/15/2016 18:06    Cardiac Studies   Severe AS by echo  Patient Profile     83 y.o. male severe AS, CAD  Assessment & Plan    1) AS: he had a  R and L heart cath for TAVR eval. Met with Dr. Burt Knack and Dr. Servando Snare regarding TAVR, felt to be a high risk. Underwent CT scans yesterday. Follow up today.    2) AFib: Rate remains elevated today.  Continue atenolol and Diltiazem.   -- Will increase atenolol to 30m daily today for better rate control.  3) Chronic combined HF:  Diuresed for volume overload during this hospital stay. Chronic diastolic heart failure due to age and AFib.   -- Bilateral crackles on exam today. Will give 468mIV lasix today.   3) CAD: No angina.  4) Hyponatremia: Slightly improved today.   Signed, LiReino BellisNP  06/16/2016, 8:15 AM    I have examined the patient and reviewed assessment and plan and discussed with patient.  Agree with above as stated. Prominent systolic murmur, slow gait and needs assistance to get to the bedside commode.  Increase atenolol for better rate control.  PT to do pre-TAVR eval.  Lasix 40 x 1 today.  I spoke to Dr. CoBurt Knack The patient may require some teeth removed.  Therefore, will stop pradaxa and transition to IV heparin per pharmacy to allow for  dental procedures.  TAVR will be done as an inpatient.    Today, I discussed the timing of TAVR with the patent and he is willing to stay in the hospital until the procedure is completed.   JaLarae Grooms

## 2016-06-16 NOTE — Progress Notes (Signed)
PROGRESS NOTE  Hyden Thayn [id number] DOB: [birth year] DOA: 06/07/2016   PCP: Merrilee Seashore, MD   LOS: 9 days   Brief Narrative: 83 y.o. male with medical history significant of atrial fibrillation on Pradaxa, CAD, CABG, hyperlipidemia, hypertension, moderate to severe aortic stenosis is coming to the emergency department with complaints of on/off chest pressure, radiates to his back, improved by rest for the past week associated with dyspnea, orthopnea and fatigue.  He was found to be hyponatremic in the ED.  He was felt to be dehydrated, and received gentle IV hydration, which resulted in improvement in his sodium however he developed fluid overload quite rapidly.  2D echo later showed critical aortic stenosis and cardiology was consulted, patient was started on IV Lasix, his sodium has improved and cardiology is currently evaluating for TAVR.  Assessment & Plan:  Hyponatremia - Patient was initially hypovolemic and his sodium improved with gentle hydration, however he developed fluid overload rather rapidly due to his critical aortic stenosis, with worsening hyponatremia.   - He required IV Lasix, with improvement in his respiratory status as well as improvement in his sodium levels. - Na overall improving, will check Na in AM  Chest pain - 2D echo done on 2/22 showed an ejection fraction of 45-50%, with mildly reduced systolic function, with diffuse hypokinesis and critical aortic valve stenosis.  Valve area was calculated at 0.26 cm.  Prior to the echo done in April 2017 showed normal EF of 0000000, normal systolic function, with a valve area of 0.55 - per cardiology may need TAVR, will follow up on recommendations  - appreciate cardiothoracic surgery assistance   Acute on probable chronic systolic CHF - Suspect related to his aortic valve stenosis, very sensitive to fluids, chest x-ray with mild fluid overload, renal function has remained stable - management per cardiology    CAP - Completed 7 days therapy with ABX  - still with leukocytosis but no fever, WBC trending down   Acute encephalopathy - Patient with intermittent confusion, this is likely in the setting of infectious process, as well as probable delirium related to inpatient stay - still with intermittent confusion but appears to be stable at baseline   CAD (coronary artery disease) - Continue home medications, cardiology evaluating  Aortic stenosis, moderate >> severe  - Updated 2D echo as below, cards following, now echo reads the aortic stenosis is critical - pt is willing to proceed with surgery  - surgery to see pt today and will follow up on recommendations   Essential hypertension - Blood pressure controlled 113/68, continue current regimen  Dyslipidemia, goal LDL below 70 - Continue Crestor  Atrial fibrillation, permanent (HCC) - CHA2DS2-VASc Score of at least 4. - Continue atenolol for rate control, resume Pradaxa per cardiology   DVT prophylaxis: SCDs Code Status: Full code Family Communication: no family at bedside  Disposition Plan: remains inpatient  Consultants:   Cardiology  Cardiothoracic surgery   Procedures:   2D echo Study Conclusions - Left ventricle: The cavity size was normal. Wall thickness was increased in a pattern of mild LVH. Systolic function was normal. The estimated ejection fraction was in the range of 55% to 60%. Wall motion was normal; there were no regional wall motion abnormalities. - Aortic valve: Valve mobility was restricted. There was severe stenosis. There was moderate regurgitation. Peak velocity (S): 451 cm/s. Mean gradient (S): 43 mm Hg. - Mitral valve: Moderately calcified annulus. There was mild regurgitation. - Left atrium: The atrium was moderately dilated. -  Pulmonary arteries: Systolic pressure was mildly increased. PA peak pressure: 32 mm Hg (S).  Impressions: - Mean gradient 16mmHg from prior echocardiogram. Aortic  stenosis has increased.  Antimicrobials:  Ceftriaxone 2/21 >>   azithromycin 2/21 >>  Subjective: No events overnight.   Objective: Vitals:   06/16/16 0815 06/16/16 0845 06/16/16 0905 06/16/16 1248  BP: 112/85 102/80 103/68 114/85  Pulse:    64  Resp:    18  Temp:    98.4 F (36.9 C)  TempSrc:    Oral  SpO2:    96%  Weight:      Height:        Intake/Output Summary (Last 24 hours) at 06/16/16 1322 Last data filed at 06/16/16 1042  Gross per 24 hour  Intake              660 ml  Output              500 ml  Net              160 ml   Filed Weights   06/15/16 0315 06/15/16 0455 06/16/16 0500  Weight: 80.6 kg (177 lb 11.1 oz) 80.5 kg (177 lb 8 oz) 84.6 kg (186 lb 9.6 oz)    Examination: Constitutional: NAD Vitals:   06/16/16 0815 06/16/16 0845 06/16/16 0905 06/16/16 1248  BP: 112/85 102/80 103/68 114/85  Pulse:    64  Resp:    18  Temp:    98.4 F (36.9 C)  TempSrc:    Oral  SpO2:    96%  Weight:      Height:       Eyes: PERRL, lids and conjunctivae normal Respiratory: diminished breath sounds at bases   Musculoskeletal: no clubbing / cyanosis.  Neurologic: CN 2-12 grossly intact. Strength 5/5 in all 4.  Psychiatric: Alert and oriented 3  Data Reviewed: I have personally reviewed following labs and imaging studies  CBC:  Recent Labs Lab 06/12/16 0257 06/13/16 0352 06/14/16 0654 06/15/16 0410 06/16/16 0437  WBC 10.3 9.4 12.6* 14.3* 12.9*  NEUTROABS 8.1* 6.9 10.0* 11.7* 10.0*  HGB 10.7* 11.0* 11.2* 11.4* 10.8*  HCT 30.8* 32.0* 32.7* 33.8* 32.4*  MCV 91.9 93.6 94.5 94.2 95.6  PLT 289 310 335 369 0000000   Basic Metabolic Panel:  Recent Labs Lab 06/10/16 1639 06/11/16 0435 06/12/16 0257 06/13/16 0352 06/16/16 0437  NA 122* 124* 126* 130* 133*  K 4.2 3.6 2.9* 4.1 3.8  CL 89* 87* 88* 93* 98*  CO2 26 28 29 29 26   GLUCOSE 131* 119* 115* 109* 106*  BUN 17 15 11 11 7   CREATININE 0.71 0.70 0.65 0.69 0.67  CALCIUM 8.9 8.7* 8.6* 8.8* 8.9    Coagulation Profile:  Recent Labs Lab 06/11/16 0435  INR 1.29    Recent Results (from the past 240 hour(s))  Blood culture (routine x 2)     Status: None   Collection Time: 06/07/16  2:18 AM  Result Value Ref Range Status   Specimen Description BLOOD RIGHT HAND  Final   Special Requests BOTTLES DRAWN AEROBIC AND ANAEROBIC 5ML  Final   Culture NO GROWTH 5 DAYS  Final   Report Status 06/12/2016 FINAL  Final  Blood culture (routine x 2)     Status: None   Collection Time: 06/07/16  2:35 AM  Result Value Ref Range Status   Specimen Description BLOOD LEFT ARM  Final   Special Requests BOTTLES DRAWN AEROBIC AND ANAEROBIC 5ML  Final  Culture NO GROWTH 5 DAYS  Final   Report Status 06/12/2016 FINAL  Final  Culture, sputum-assessment     Status: None   Collection Time: 06/07/16 11:21 AM  Result Value Ref Range Status   Specimen Description SPUTUM  Final   Special Requests Immunocompromised  Final   Sputum evaluation THIS SPECIMEN IS ACCEPTABLE FOR SPUTUM CULTURE  Final   Report Status 06/07/2016 FINAL  Final  Culture, respiratory (NON-Expectorated)     Status: None   Collection Time: 06/07/16 11:21 AM  Result Value Ref Range Status   Specimen Description SPUTUM  Final   Special Requests Immunocompromised Reflexed from [id number]  Final   Gram Stain   Final    MODERATE WBC PRESENT, PREDOMINANTLY PMN FEW SQUAMOUS EPITHELIAL CELLS PRESENT RARE GRAM POSITIVE COCCOBACILLUS RARE GRAM POSITIVE COCCI IN PAIRS RARE GRAM NEGATIVE COCCOBACILLI    Culture Consistent with normal respiratory flora.  Final   Report Status 06/09/2016 FINAL  Final  MRSA PCR Screening     Status: None   Collection Time: 06/07/16  3:05 PM  Result Value Ref Range Status   MRSA by PCR NEGATIVE NEGATIVE Final    Comment:        The GeneXpert MRSA Assay (FDA approved for NASAL specimens only), is one component of a comprehensive MRSA colonization surveillance program. It is not intended to diagnose  MRSA infection nor to guide or monitor treatment for MRSA infections.     Radiology Studies: Ct Cardiac Morph/pulm Vein W/cm&w/o Ca Score  Addendum Date: 06/16/2016   ADDENDUM REPORT: 06/16/2016 09:50 EXAM: OVER-READ INTERPRETATION  CT CHEST The following report is an over-read performed by radiologist Dr. Rebekah Chesterfield [facility] on 06/16/2016. This over-read does not include interpretation of cardiac or coronary anatomy or pathology. The cardiac CT interpretation by the cardiologist is attached. COMPARISON:  None. FINDINGS: Full description of extracardiac findings on separate dictation for contemporaneously obtained CTA of the chest, abdomen and pelvis 06/15/2016. IMPRESSION: See separate dictation for contemporaneously obtained CTA of the chest, abdomen and pelvis 06/15/2016 for full description of extracardiac findings. Electronically Signed   By: Vinnie Langton M.D.   On: 06/16/2016 09:50   Result Date: 06/16/2016 CLINICAL DATA:  83 year old male with severe aortic stenosis. EXAM: Cardiac TAVR CT TECHNIQUE: The patient was scanned on a Philips 256 scanner. A 120 kV retrospective scan was triggered in the descending thoracic aorta at 111 HU's. Gantry rotation speed was 270 msecs and collimation was .9 mm. 20 mg of iv Metoprolol and no nitro were given. The 3D data set was reconstructed in 5% intervals of the R-R cycle. Systolic and diastolic phases were analyzed on a dedicated work station using MPR, MIP and VRT modes. The patient received 80 cc of contrast. FINDINGS: Aortic Valve: Trileaflet, severely calcified and thickened with severely restricted leaflet opening and minimal calcifications extending into the LVOT. Aorta:  Normal size, no dissection.  Mild diffuse calcifications. Sinotubular Junction:  30 x 30 mm Ascending Thoracic Aorta:  33 x 32 mm Aortic Arch:  24 x 24 mm Descending Thoracic Aorta:  24 x 24 mm Sinus of Valsalva Measurements: Non-coronary:  37 mm Right  -coronary:  33 mm Left -coronary:  34 mm Coronary Artery Height above Annulus: Left Main:  16 mm Right Coronary:  24 mm Virtual Basal Annulus Measurements: Maximum/Minimum Diameter:  32 x 22 mm Perimeter:  94 mm Area:  596 mm2 Optimum Fluoroscopic Angle for Delivery:  RAO 2 CRA 1 Dilated pulmonary artery measuring  32 x 29 mm No thrombus in the left atrial appendage. Normal pulmonary vein drainage into the left atrium. No ASD/VSD seen. IMPRESSION: 1. Trileaflet, severely calcified and thickened with severely restricted leaflet opening and minimal calcifications extending into the LVOT. Annular measurements suitable for delivery of a 29 mm Edwards-SAPIEN 3 TAVR valve. 2. Optimum Fluoroscopic Angle for Delivery:  RAO 2 CRA 1 Ena Dawley Electronically Signed: By: Ena Dawley On: 06/15/2016 18:06   Ct Angio Chest/abd/pel For Dissection W And/or W/wo  Result Date: 06/16/2016 CLINICAL DATA:  83 year old male with history of severe aortic stenosis. Preprocedural study prior to potential transcatheter aortic valve replacement (TAVR). EXAM: CT ANGIOGRAPHY CHEST, ABDOMEN AND PELVIS TECHNIQUE: Multidetector CT imaging through the chest, abdomen and pelvis was performed using the standard protocol during bolus administration of intravenous contrast. Multiplanar reconstructed images and MIPs were obtained and reviewed to evaluate the vascular anatomy. CONTRAST:  80 mL of Isovue 370. COMPARISON:  No priors. FINDINGS: CTA CHEST FINDINGS Cardiovascular: Heart size is mildly enlarged with biatrial dilatation. There is no significant pericardial fluid, thickening or pericardial calcification. There is aortic atherosclerosis, as well as atherosclerosis of the great vessels of the mediastinum and the coronary arteries, including calcified atherosclerotic plaque in the left main, left anterior descending and left circumflex coronary arteries. Status post median sternotomy for CABG, including LIMA to the LAD.  Mediastinum/Lymph Nodes: Multiple prominent borderline enlarged mediastinal and hilar lymph nodes are noted, likely reactive. Esophagus is unremarkable in appearance. No axillary lymphadenopathy. Lungs/Pleura: Moderate right and small left pleural effusions lie dependently. Throughout the lungs bilaterally there are relatively symmetric areas of ground-glass attenuation and septal thickening which are most evident throughout the mid to upper lungs. Lower lungs appear relatively normal, with a few patchy areas of ground-glass attenuation. Musculoskeletal/Soft Tissues: Median sternotomy wires. There are no aggressive appearing lytic or blastic lesions noted in the visualized portions of the skeleton. CTA ABDOMEN AND PELVIS FINDINGS Hepatobiliary: Liver has a slightly shrunken appearance and nodular contour, suggestive of underlying cirrhosis. No cystic or solid hepatic lesions. No intra or extrahepatic biliary ductal dilatation. Gallbladder is normal in appearance. Pancreas: No pancreatic mass. No pancreatic ductal dilatation. No pancreatic or peripancreatic fluid or inflammatory changes. Spleen: Unremarkable. Adrenals/Urinary Tract: Bilateral kidneys and bilateral adrenal glands are unremarkable in appearance. No hydroureteronephrosis. Small filling defect in the urinary bladder measuring 0.6 x 1.3 x 1.6 cm (axial image 254 of series 401 and coronal image 108 of series 403), with potential stalk like attachment to the wall of the urinary bladder (coronal image 108 of series 403). There is also some plaque-like irregularity along the posterior aspect of the urinary bladder wall, best appreciated on sagittal image 124 of series 404. Stomach/Bowel: The appearance of the stomach is normal. There is no pathologic dilatation of small bowel or colon. Vascular/Lymphatic: Aortic atherosclerosis, with vascular findings and measurements pertinent to potential TAVR procedure, as detailed below. 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 Throughout the abdominal aorta there multiple ulcerated plaques. The largest of these plaques is best demonstrated in cross-section on axial image 200 of series 401 adjacent to the inferior mesenteric artery origin, where it appears as a short segment dissection (however, this does not appear to be a true dissection). 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 The celiac axis, superior mesenteric artery and inferior mesenteric artery are all patent without definite hemodynamically significant stenosis. Two right-sided and one left-sided renal arteries are all patent without hemodynamically significant stenosis. No lymphadenopathy noted in the abdomen or pelvis. Reproductive: Prostate gland and seminal vesicles are unremarkable in appearance. Other: No significant volume of ascites.  No pneumoperitoneum. Musculoskeletal: There are no aggressive appearing lytic or blastic lesions noted in the visualized portions of the skeleton. VASCULAR MEASUREMENTS PERTINENT TO TAVR: AORTA: Minimal Aortic Diameter -  19 x 15 mm Severity of Aortic Calcification -  moderate to severe RIGHT PELVIS: Right Common Iliac Artery - Minimal Diameter - 11.2 x 8.3 mm Tortuosity - severe Calcification - moderate Right External Iliac Artery - Minimal Diameter - 8.7 x 9.1 mm Tortuosity - severe Calcification - mild Right Common Femoral Artery - Focal aneurysmal dilatation up to 17 mm with large ulcerated plaque. Minimal Diameter - 8.5 x 6.3 mm Tortuosity - mild Calcification - moderate LEFT PELVIS: Left Common Iliac Artery - Minimal Diameter - 8.1 x 7.8 mm Tortuosity - severe Calcification - moderate Left External Iliac Artery - Minimal Diameter - 9.1 x 8.5 mm Tortuosity - severe Calcification - mild Left  Common Femoral Artery - Minimal Diameter - 8.7 x 7.6 mm Tortuosity - mild Calcification - moderate Review of the MIP images confirms the above findings. IMPRESSION: 1. Vascular findings and measurements pertinent to potential TAVR procedure, as detailed above. From a size criteria, the patient has suitable pelvic arterial access bilaterally. 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 In addition, there is a large ulcerated plaque in the infrarenal abdominal aorta (adjacent to the origin of the inferior mesenteric artery), best appreciated on axial image 200 of series 401, which in the axial plane has an appearance of a very short segment dissection. Caution should be taken when traversing this region with a catheter. 2. Severe thickening calcification of the aortic valve, compatible with the reported clinical history of severe aortic stenosis. 3. Extensive ground-glass attenuation and septal thickening throughout the mid to upper lungs bilaterally, strongly favored to be related to an acute infection. There may also be a background of very mild interstitial pulmonary edema, best appreciated in the lower lungs. 4. Moderate right and small left pleural effusions lying dependently. 5. Plaque-like irregularity along the posterior aspect of the urinary bladder wall, adjacent to a small filling defect which may have an attachment to the wall of the urinary bladder. These findings are concerning for potential urothelial neoplasm, and follow-up nonemergent evaluation by Urology is suggested in the near future to better evaluate these findings. 6. Morphologic changes in the liver suggestive of early cirrhosis, as above. 7. Additional incidental findings, as above. Electronically Signed   By: Vinnie Langton M.D.   On:  06/16/2016 09:01    Scheduled Meds: . aspirin EC  81 mg Oral Daily  . atenolol  75 mg Oral Daily  . dabigatran  150 mg Oral Q12H  . diltiazem  240 mg Oral Daily  . docusate sodium  100 mg Oral BID  . rosuvastatin  10 mg Oral Daily  . sodium chloride flush  3 mL Intravenous Q12H  . sodium chloride flush  3 mL Intravenous Q12H   Continuous Infusions:  Faye Ramsay, MD  [facility]  Pager [phone]  If 7PM-7AM, please contact night-coverage [email or web address] Password [email or web address] 06/16/2016, 1:22 PM

## 2016-06-16 NOTE — Progress Notes (Signed)
Physical Therapy Treatment Patient Details Name: Richard Simon MRN: [id number] DOB: 23-Jan-1930 Today's Date: 06/16/2016    History of Present Illness Pt is a 83 y.o. male with medical history significant of atrial fibrillation on Pradaxa, CAD, CABG, hyperlipidemia, hypertension, moderate to severe aortic stenosis is coming to the emergency department with complaints of on/off chest pressure, associated with dyspnea, orthopnea and fatigue. He was diagnosed with hyponatremia    PT Comments    Pt seen for mobility session with pre-TAVR assessment.  Completed separate progress note for pre-tavr results.  Pt with improved mobility, but does have decreased safety. Con't to recommend SNF.   Follow Up Recommendations  SNF;Supervision/Assistance - 24 hour     Equipment Recommendations  Rolling walker with 5" wheels    Recommendations for Other Services       Precautions / Restrictions Precautions Precautions: Fall Restrictions Weight Bearing Restrictions: No    Mobility  Bed Mobility               General bed mobility comments: Pt on toilet up on arrival  Transfers Overall transfer level: Needs assistance Equipment used: Rolling walker (2 wheeled) Transfers: Sit to/from Stand Sit to Stand: Min assist;Min guard         General transfer comment: Pt with poor use of hand placement despite cueing from PT  Ambulation/Gait Ambulation/Gait assistance: Min guard Ambulation Distance (Feet): 167 Feet (plus 16, 16, 16) Assistive device: Rolling walker (2 wheeled) Gait Pattern/deviations: Decreased step length - right;Decreased step length - left Gait velocity: decreased   General Gait Details: Performed 6 minute walk test and then 5 meter walk test for pre-TAVR.  Pt with slow cadence on RA with o2 sat droppoign to low 80's and then donned o2 at 2 L/min and gait cadence seemed to improve.  He improved 5 meter walk speed each time, as he seemed to understand better that we were  comparing times. Poor safety at times with hand placement with transition to sitting.   Stairs            Wheelchair Mobility    Modified Rankin (Stroke Patients Only)       Balance Overall balance assessment: Needs assistance         Standing balance support: During functional activity Standing balance-Leahy Scale: Poor Standing balance comment: Pt able to wipe self after BM, but needed A for support                    Cognition Arousal/Alertness: Awake/alert Behavior During Therapy: WFL for tasks assessed/performed Overall Cognitive Status: No family/caregiver present to determine baseline cognitive functioning       Memory: Decreased short-term memory   Safety/Judgement: Decreased awareness of safety   Problem Solving: Slow processing;Decreased initiation;Requires verbal cues;Requires tactile cues General Comments: Pt was appropriate, but then at times would say random things that made no sense and not related to current conversation.  decreased memory    Exercises      General Comments        Pertinent Vitals/Pain Pain Assessment: No/denies pain    Home Living                      Prior Function            PT Goals (current goals can now be found in the care plan section) Acute Rehab PT Goals Patient Stated Goal: get better PT Goal Formulation: With patient Time For Goal Achievement: 06/23/16 Potential  to Achieve Goals: Good Progress towards PT goals: Progressing toward goals    Frequency    Min 3X/week      PT Plan Current plan remains appropriate    Co-evaluation             End of Session Equipment Utilized During Treatment: Gait belt;Oxygen Activity Tolerance: Patient tolerated treatment well Patient left: in chair;with call bell/phone within reach;with chair alarm set Nurse Communication: Mobility status PT Visit Diagnosis: Muscle weakness (generalized) (M62.81);Unsteadiness on feet (R26.81);Difficulty in  walking, not elsewhere classified (R26.2)     Time: [id number] PT Time Calculation (min) (ACUTE ONLY): 47 min  Charges:  $Gait Training: 38-52 mins                    G Codes:       [name] 06/16/2016, 10:50 AM

## 2016-06-16 NOTE — Care Management Important Message (Signed)
Important Message  Patient Details  Name: Richard Simon MRN: [id number] Date of Birth: January 03, 1930   Medicare Important Message Given:  Yes    [name] Abena 06/16/2016, 1:12 PM

## 2016-06-16 NOTE — Progress Notes (Signed)
PT NOTE: Pre-TAVR assessment results  6 min walk test  Distance 167' with RW BP Pre 108/63      Post 113/84 HR pre 124 bpm       Post 128 bpm o2 sat pre (on RA) 97%- attempted ambulation on RA, but de-sat to low 80's after ~ 60' (2 mins) donned 2 L/min and post gait 88% on 2 L/min Modified Borg dyspnea scale: pre 1/10  Post 3/10 Rate of perceived exertion: pre 0/10   Post 3/10  5 meter walk test 11.21 sec, 7.3 sec, and 5.4 sec with average of 7.97 seconds (used RW)  Clinical Fraility 5- Mildly Frail  Richard Simon, [place] Pager [phone] 06/16/2016

## 2016-06-16 NOTE — Clinical Social Work Note (Signed)
CSW has received [facility] authorization to discharge to [facility]. The authorization number is [id number] and is good for admission to [facility] between 3/2-3/5 only. Please discharge patient once medically stable. Admission coordinator Rollene Fare has approved the patient to come to [facility] once medically ready.  Liz Beach MSW, [place], [place], [id number]

## 2016-06-16 NOTE — Consult Note (Addendum)
[facility]  CARDIOTHORACIC SURGERY CONSULTATION REPORT  Referring Provider and Primary Cardiologist is Glenetta Hew, MD PCP is Merrilee Seashore, MD  Chief Complaint  Patient presents with  . Chest Pain  . Shortness of Breath  Severe aortic stenosis  HPI:  The patient is an 83 year old gentleman with permanent atrial fibrillation on Pradaxa, hypertension, dyslipidemia, CAD s/p CABG by Dr. Servando Snare in 2004, and known aortic stenosis who was admitted 06/07/2016 with acute on chronic combined systolic and diastolic heart failure. He had progressive exertional shortness of breath, orthopnea and exertional fatigue and says that he developed dizziness and felt like he was going to pass out. He called his son and then called 911. He says that he knew something bad was happening. On admission his BNP was 277 and troponin was negative. His CXR showed hazy opacities within the upper lobes suspicious for an infectious or inflammatory process. A 2D echo showed severe AS with a mean gradient of 52 mm Hg and an AVA of 0.3 cm2. The LVEF was 45-50%. His last echo in 07/2015 had shown a mean gradient of 43 mm Hg with a peak velocity of 451 cm/s consistent with severe AS and there was moderate AI. His EF was 55-60% at that time. He was apparently not having any symptoms at that time and did not return until this admission. Cath now shows severe native vessel CAD with patent bypass grafts. PA pressure was 48/23/33. PA sat 58%. CI 2.2 by TD and 2.54 by Fick. Carotid dopplers show no significant stenosis.   He lives at home with his wife who is in poor health and his son. He cares for his wife who is poorly mobile. He was still driving and getting around prior to this admission.   Past Medical History:  Diagnosis Date  . Atrial fibrillation, permanent ([place])    Rate controlled. Anticoagulation with Pradaxa.  Marland Kitchen CAD (coronary artery disease) 2004   S/P  4-vessel CABG in 2004  . Dyslipidemia, goal LDL below 70   . Hypertension     very well controlled  . Moderate to severe aortic stenosis 01/2013; 06/2014   Echo Eval: a) 01/2013: Mod calcified AoV w/ reduced mobility of R cusp. Mod-Severe stenosis. Mn-Pk gradient 25 mmHg--39 mmHg. AVA ~ 0.79-0.83 cm. Moderate MR. Normal LV size and function EF 55-60%. Elevated LAP. Mild pulmonary attention. Mild moderately dilated left atrium.;; b) 06/2014: EF 60-65%. Mod-Severe AS, AVA~0.69, Mn-Pk gradient 32 mmHg -- 55 mmHg, severe LA dilation, moderate-severe RA d  . S/P CABG x 4 2004   Post CABG    Past Surgical History:  Procedure Laterality Date  . CARDIAC CATHETERIZATION  12/11/2002   Recommendation-CABG; 80% mid LAD, 70% distal LAD. Mid circumflex 95%, OM1 95%. AV groove circumflex 95%. Proximal RCA 100%  . CORONARY ARTERY BYPASS GRAFT  12/15/2002   LIMA-LAD, SVG-diagonal, SVG-OM1-OM2 sequential  Carlton Adam MYOVIEW  December 2012   Fixed basal inferior diaphragmatic attenuation versusinfarct, no ischemia  . RIGHT HEART CATH AND CORONARY/GRAFT ANGIOGRAPHY N/A 06/13/2016   Procedure: Right Heart Cath and Coronary/Graft Angiography;  Surgeon: Leonie Man, MD;  Location: [facility];  Service: Cardiovascular;  Laterality: N/A;  . TRANSTHORACIC ECHOCARDIOGRAM  September 2013   Normal EF. > 55%; moderate aortic valve stenosis with mild AI. Gradient: Peak 40 mmHg, mean 24 military. Moderate LA dilation. Moderate TR with RV pressure 30-40 mmHg. Mild MR.  Domingo Dimes ECHOCARDIOGRAM  October  2014   Moderate-severe AS with mild AI. Peak and mean gradients no significant change: 39 mmHg and 25 mmHg;; mild LV dilation with EF 55-60%. Elevated LAP. Moderate MR. Mild to moderate LA dilation. Mildly elevated RV pressure.  . TRANSTHORACIC ECHOCARDIOGRAM  March 2016   EF 60-65%. Mod-Severe AS, AVA~0.69, Mn-Pk gradient 32 mmHg -- 55 mmHg, severe LA dilation, moderate-severe RA dilation: Progression of  disease    Family History  Problem Relation Age of Onset  . Hip fracture Mother     Social History   Social History  . Marital status: Married    Spouse name: N/A  . Number of children: N/A  . Years of education: N/A   Occupational History  . Not on file.   Social History Main Topics  . Smoking status: Former Smoker    Quit date: 02/12/1975  . Smokeless tobacco: Never Used  . Alcohol use 8.4 oz/week    14 Glasses of wine per week  . Drug use: Unknown  . Sexual activity: Not on file   Other Topics Concern  . Not on file   Social History Narrative   Married. Father of 74, grandfather 62, great-grandfather 3.   Very active, but without routine exercise. Does yard work and intermittent walking, but nothing routine.   Does not smoke. Occasional alcohol.    Current Facility-Administered Medications  Medication Dose Route Frequency Provider Last Rate Last Dose  . 0.9 %  sodium chloride infusion  250 mL Intravenous PRN Leonie Man, MD      . acetaminophen (TYLENOL) tablet 650 mg  650 mg Oral Q6H PRN Reubin Milan, MD   650 mg at 06/14/16 1411  . ALPRAZolam Duanne Moron) tablet 0.25 mg  0.25 mg Oral QHS PRN Rise Patience, MD   0.25 mg at 06/15/16 2142  . aspirin EC tablet 81 mg  81 mg Oral Daily Reubin Milan, MD   81 mg at 06/16/16 [id number]  . atenolol (TENORMIN) tablet 75 mg  75 mg Oral Daily Cheryln Manly, NP   75 mg at 06/16/16 [id number]  . dabigatran (PRADAXA) capsule 150 mg  150 mg Oral Q12H Leonie Man, MD   150 mg at 06/16/16 0903  . diltiazem (CARDIZEM CD) 24 hr capsule 240 mg  240 mg Oral Daily Reubin Milan, MD   240 mg at 06/16/16 [id number]  . docusate sodium (COLACE) capsule 100 mg  100 mg Oral BID Rhetta Mura Schorr, NP   100 mg at 06/16/16 0904  . guaiFENesin-dextromethorphan (ROBITUSSIN DM) 100-10 MG/5ML syrup 5 mL  5 mL Oral Q4H PRN Caren Griffins, MD   5 mL at 06/15/16 2142  . HYDROcodone-acetaminophen (NORCO/VICODIN) 5-325 MG per tablet 1-2  tablet  1-2 tablet Oral Q4H PRN Jeryl Columbia, NP   2 tablet at 06/15/16 0119  . levalbuterol (XOPENEX) nebulizer solution 0.63 mg  0.63 mg Nebulization Q6H PRN Caren Griffins, MD   0.63 mg at 06/11/16 2223  . magnesium hydroxide (MILK OF MAGNESIA) suspension 30 mL  30 mL Oral Daily PRN Caren Griffins, MD   30 mL at 06/14/16 1411  . ondansetron (ZOFRAN) tablet 4 mg  4 mg Oral Q6H PRN Reubin Milan, MD       Or  . ondansetron [facility]) injection 4 mg  4 mg Intravenous Q6H PRN Reubin Milan, MD   4 mg at 06/10/16 1043  . rosuvastatin (CRESTOR) tablet 10 mg  10 mg Oral Daily Shanon Brow  Judieth Keens, MD   10 mg at 06/16/16 [id number]  . sodium chloride flush (NS) 0.9 % injection 3 mL  3 mL Intravenous Q12H Reubin Milan, MD   3 mL at 06/15/16 2149  . sodium chloride flush (NS) 0.9 % injection 3 mL  3 mL Intravenous Q12H Leonie Man, MD   3 mL at 06/16/16 0904  . sodium chloride flush (NS) 0.9 % injection 3 mL  3 mL Intravenous PRN Leonie Man, MD      . witch hazel-glycerin (TUCKS) pad   Topical PRN Theodis Blaze, MD        No Known Allergies    Review of Systems:   General:  normal appetite, reduced energy, no weight gain, no weight loss, no fever  Cardiac:  no chest pain with exertion, no chest pain at rest,  Has SOB with mild exertion, no resting SOB, no PND, has orthopnea, palpitations, has arrhythmia, chronic persistent atrial fibrillation, no LE edema, recent dizzy spells, no syncope  Respiratory:  exertional shortness of breath, no home oxygen, has productive cough, no dry cough, no bronchitis, no wheezing, no hemoptysis, no asthma, no pain with inspiration or cough, no sleep apnea, no CPAP at night  GI:   no difficulty swallowing, no reflux, no frequent heartburn, no hiatal hernia, no abdominal pain, no constipation, no diarrhea, no hematochezia, no hematemesis, no melena  GU:   no dysuria,  no frequency, no urinary tract infection, no hematuria, no enlarged prostate,  no kidney stones, no kidney disease  Vascular:  no pain suggestive of claudication, no pain in feet, no leg cramps, no varicose veins, no DVT, no non-healing foot ulcer  Neuro:   no stroke, no TIA's, no seizures, no headaches, no temporary blindness one eye,  no slurred speech, no peripheral neuropathy, no chronic pain, no instability of gait, no memory/cognitive dysfunction  Musculoskeletal: no arthritis, no joint swelling, no myalgias, no difficulty walking, normal mobility   Skin:   no rash, no itching, no skin infections, no pressure sores or ulcerations  Psych:   no anxiety, no depression, no nervousness, no unusual recent stress  Eyes:   no blurry vision, no floaters, no recent vision changes,  wears glasses or contacts  ENT:   no hearing loss, no loose or painful teeth, no dentures, last saw dentist many years ago  Hematologic:  no easy bruising, no abnormal bleeding, no clotting disorder, no frequent epistaxis  Endocrine:  no diabetes, does not check CBG's at home           Physical Exam:   BP 103/68   Pulse 75   Temp 98.4 F (36.9 C) (Oral)   Resp 20   Ht 5\' 9"  (1.753 m)   Wt 84.6 kg (186 lb 9.6 oz)   SpO2 94%   BMI 27.56 kg/m   General:  Elderly, frail-appearing in no distress. Rambles a little.  HEENT:  Unremarkable, NCAT, PERLA, EOMI, poor dentition with multiple missing and broken off teeth. Plaque along gum line.  Neck:   no JVD, no bruits, no adenopathy or thyromegaly  Chest:   clear to auscultation, symmetrical breath sounds, no wheezes, no rhonchi   CV:   RRR, grade III/VI high-pitched crescendo/decrescendo murmur heard best at RSB, absent S2 but no diastolic murmur  Abdomen:  soft, non-tender, no masses or organomegaly  Extremities:  warm, well-perfused, pulses palpable in feet, no LE edema  Rectal/GU  Deferred  Neuro:   Grossly non-focal and  symmetrical throughout  Skin:   Clean and dry, no rashes, no breakdown   Diagnostic Tests:                              *[place]*                   *[facility]*                         [address], [place] [address]                            [phone]  ------------------------------------------------------------------- Transthoracic Echocardiography  Patient:    Richard Simon, Richard Simon MR #:       [id number] Study Date: 06/08/2016 Gender:     M Age:        41 Height:     175.3 cm Weight:     85.9 kg BSA:        2.06 m^2 Pt. Status: Room:       [id number]   ATTENDING    [place], [place], Inpatient  ADMITTING    Reubin Milan  ORDERING     Reubin Milan  REFERRING    Reubin Milan  SONOGRAPHER  Madelin Rear, RDCS  cc:  ------------------------------------------------------------------- LV EF: 45% -   50%  ------------------------------------------------------------------- Indications:      Chest pain 786.51.  ------------------------------------------------------------------- History:   PMH:   Atrial fibrillation.  Coronary artery disease. Aortic valve disease.  Risk factors:  Hypertension.  ------------------------------------------------------------------- Study Conclusions  - Left ventricle: The cavity size was normal. Systolic function was   mildly reduced. The estimated ejection fraction was in the range   of 45% to 50%. Diffuse hypokinesis. - Aortic valve: Severely calcified annulus. Trileaflet; severely   thickened, severely calcified leaflets. Valve mobility was   restricted. There was critical stenosis. There was moderate   regurgitation. Valve area (VTI): 0.26 cm^2. Valve area (Vmax):   0.32 cm^2. Valve area (Vmean): 0.26 cm^2. - Mitral valve: Moderately calcified annulus. Transvalvular   velocity was within the normal range. There was no evidence for   stenosis. There was mild regurgitation. - Left atrium: The atrium was mildly dilated. - Right ventricle: Systolic function was  normal. - Right atrium: The atrium was mildly dilated. - Atrial septum: No defect or patent foramen ovale was identified   by color flow Doppler. - Tricuspid valve: There was trivial regurgitation. - Pulmonary arteries: Systolic pressure was severely increased. PA   peak pressure: 63 mm Hg (S).  ------------------------------------------------------------------- Labs, prior tests, procedures, and surgery: Coronary artery bypass grafting.  ------------------------------------------------------------------- Study data:  Comparison was made to the study of 07/20/2015.  Study status:  Routine.  Procedure:  The patient reported no pain pre or post test. Transthoracic echocardiography. Image quality was fair. Study completion:  There were no complications. Transthoracic echocardiography.  M-mode, complete 2D, spectral Doppler, and color Doppler.  Birthdate:  Patient birthdate: 09-Jan-1930.  Age:  Patient is 83 yr old.  Sex:  Gender: male. BMI: 28 kg/m^2.  Blood pressure:     110/82  Patient status: Inpatient.  Study date:  Study date: 06/08/2016. Study time: 03:33 PM.  Location:  Bedside.  -------------------------------------------------------------------  ------------------------------------------------------------------- Left ventricle:  The cavity size was normal. Systolic function was mildly reduced. The estimated ejection fraction was in the range of 45% to 50%. Diffuse hypokinesis. The study was not technically sufficient to allow evaluation of LV diastolic dysfunction due to atrial fibrillation.  ------------------------------------------------------------------- Aortic valve:  Poorly visualized.  Severely calcified annulus. Trileaflet; severely thickened, severely calcified leaflets. Valve mobility was restricted.  Doppler:   There was critical stenosis. There was moderate regurgitation.    VTI ratio of LVOT to aortic valve: 0.11. Valve area (VTI): 0.26 cm^2. Indexed  valve area (VTI): 0.13 cm^2/m^2. Peak velocity ratio of LVOT to aortic valve: 0.14. Valve area (Vmax): 0.32 cm^2. Indexed valve area (Vmax): 0.16 cm^2/m^2. Mean velocity ratio of LVOT to aortic valve: 0.11. Valve area (Vmean): 0.26 cm^2. Indexed valve area (Vmean): 0.12 cm^2/m^2.    Mean gradient (S): 52 mm Hg. Peak gradient (S): 79 mm Hg.  ------------------------------------------------------------------- Aorta:  Aortic root: The aortic root was normal in size.  ------------------------------------------------------------------- Mitral valve:   Moderately calcified annulus. Mobility was not restricted.  Doppler:  Transvalvular velocity was within the normal range. There was no evidence for stenosis. There was mild regurgitation.  ------------------------------------------------------------------- Left atrium:  The atrium was mildly dilated.  ------------------------------------------------------------------- Atrial septum:  No defect or patent foramen ovale was identified by color flow Doppler.  ------------------------------------------------------------------- Right ventricle:  The cavity size was normal. Wall thickness was normal. Systolic function was normal.  ------------------------------------------------------------------- Pulmonic valve:    Structurally normal valve.   Cusp separation was normal.  Doppler:  Transvalvular velocity was within the normal range. There was no evidence for stenosis. There was no regurgitation.  ------------------------------------------------------------------- Tricuspid valve:   Structurally normal valve.    Doppler: Transvalvular velocity was within the normal range. There was trivial regurgitation.  ------------------------------------------------------------------- Pulmonary artery:   The main pulmonary artery was normal-sized. Systolic pressure was severely  increased.  ------------------------------------------------------------------- Right atrium:  The atrium was mildly dilated.  ------------------------------------------------------------------- Pericardium:  There was no pericardial effusion.  ------------------------------------------------------------------- Systemic veins: Inferior vena cava: The vessel was dilated. The respirophasic diameter changes were blunted (< 50%), consistent with elevated central venous pressure.  ------------------------------------------------------------------- Measurements   Left ventricle                           Value          Reference  LV ID, ED, PLAX chordal                  50    mm       43 - 52  LV ID, ES, PLAX chordal                  37    mm       23 - 38  LV fx shortening, PLAX chordal   (L)     26    %        >=29  LV PW thickness, ED                      10    mm       ----------  IVS/LV PW ratio, ED                      0.9            <=1.3  Stroke volume, 2D  30    ml       ----------  Stroke volume/bsa, 2D                    15    ml/m^2   ----------  LV ejection fraction, 1-p A4C            41    %        ----------  LV end-diastolic volume, 2-p             123   ml       ----------  LV end-systolic volume, 2-p              74    ml       ----------  LV ejection fraction, 2-p                40    %        ----------  Stroke volume, 2-p                       49    ml       ----------  LV end-diastolic volume/bsa, 2-p         60    ml/m^2   ----------  LV end-systolic volume/bsa, 2-p          36    ml/m^2   ----------  Stroke volume/bsa, 2-p                   23.8  ml/m^2   ----------    Ventricular septum                       Value          Reference  IVS thickness, ED                        9     mm       ----------    LVOT                                     Value          Reference  LVOT ID, S                               17    mm        ----------  LVOT area                                2.27  cm^2     ----------  LVOT peak velocity, S                    62    cm/s     ----------  LVOT mean velocity, S                    39.4  cm/s     ----------  LVOT VTI, S                              13.1  cm       ----------  LVOT peak gradient, S                    2     mm Hg    ----------    Aortic valve                             Value          Reference  Aortic valve peak velocity, S            444   cm/s     ----------  Aortic valve mean velocity, S            349   cm/s     ----------  Aortic valve VTI, S                      114   cm       ----------  Aortic mean gradient, S                  52    mm Hg    ----------  Aortic peak gradient, S                  79    mm Hg    ----------  VTI ratio, LVOT/AV                       0.11           ----------  Aortic valve area, VTI                   0.26  cm^2     ----------  Aortic valve area/bsa, VTI               0.13  cm^2/m^2 ----------  Velocity ratio, peak, LVOT/AV            0.14           ----------  Aortic valve area, peak velocity         0.32  cm^2     ----------  Aortic valve area/bsa, peak              0.16  cm^2/m^2 ----------  velocity  Velocity ratio, mean, LVOT/AV            0.11           ----------  Aortic valve area, mean velocity         0.26  cm^2     ----------  Aortic valve area/bsa, mean              0.12  cm^2/m^2 ----------  velocity  Aortic regurg pressure half-time         230   ms       ----------    Aorta                                    Value          Reference  Aortic root ID, ED                       33    mm       ----------    Left atrium  Value          Reference  LA ID, A-P, ES                           46    mm       ----------  LA ID/bsa, A-P                   (H)     2.23  cm/m^2   <=2.2  LA volume, S                             68.8  ml       ----------  LA volume/bsa, S                         33.3   ml/m^2   ----------  LA volume, ES, 1-p A4C                   69.8  ml       ----------  LA volume/bsa, ES, 1-p A4C               33.8  ml/m^2   ----------  LA volume, ES, 1-p A2C                   67.5  ml       ----------  LA volume/bsa, ES, 1-p A2C               32.7  ml/m^2   ----------    Mitral valve                             Value          Reference  Mitral deceleration time         (L)     148   ms       150 - 230    Pulmonary arteries                       Value          Reference  PA pressure, S, DP               (H)     63    mm Hg    <=30    Tricuspid valve                          Value          Reference  Tricuspid regurg peak velocity           346   cm/s     ----------  Tricuspid peak RV-RA gradient            48    mm Hg    ----------    Right atrium                             Value          Reference  RA ID, S-I, ES, A4C              (H)     63.7  mm       34 -  49  RA area, ES, A4C                 (H)     22.5  cm^2     8.3 - 19.5  RA volume, ES, A/L                       64.3  ml       ----------  RA volume/bsa, ES, A/L                   31.2  ml/m^2   ----------    Systemic veins                           Value          Reference  Estimated CVP                            15    mm Hg    ----------    Right ventricle                          Value          Reference  TAPSE                                    13.5  mm       ----------  RV pressure, S, DP               (H)     63    mm Hg    <=30  RV s&', lateral, S                        6.64  cm/s     ----------  Legend: (L)  and  (H)  mark values outside specified reference range.  ------------------------------------------------------------------- Prepared and Electronically Authenticated by  Skeet Latch, MD 2018-02-22T19:10:31   Freddi Starr  Cardiac catheterization  Order# [id number]  Reading physician: Leonie Man, MD Ordering physician: Leonie Man, MD Study date: 06/13/16  Physicians    Panel Physicians Referring Physician Case Authorizing Physician  Leonie Man, MD (Primary)    Procedures   Right Heart Cath and Coronary/Graft Angiography  Conclusion     Echocardiographic evidence of Severe/Critical Aortic Stenosis - now symptomatic  Hemodynamic findings consistent with mild secondary pulmonary hypertension.  Mid LAD lesion, 70 %stenosed. Dist LAD lesion, 80 %stenosed.  LIMA-dLAD is normal in caliber and anatomically normal.  Ost 2nd Diag to 2nd Diag lesion, 95 %stenosed.  SVG-Diag2 is normal in caliber and anatomically normal.  Ost 1st Mrg to 1st Mrg lesion, 100 %stenosed.  Mid Cx lesion, 100 %stenosed.  Seq SVG- OM1-LPL(dCx) and is large and anatomically normal.  Small nondominant RCA patent   4/4 widely patent grafts with severe native coronary disease  Mildly elevated PA pressures with elevated PCWP.  Plan:  Return to nursing unit for ongoing care and management per primary service as well as cardiology consultation team.  TR band removal per protocol  CT surgery has been consulted for evaluation for possible aortic valve replacement (likley TAVR)    Glenetta Hew, M.D., M.S. Interventional Cardiologist      CT CARDIAC MORPH/PULM VEIN W/CM&W/O CA  SCORE (Accession [id number]) (Order [id number])  Imaging  Date: 06/14/2016 Department: [facility] Released By/Authorizing: Sherren Mocha, MD (auto-released)  Exam Information   Status Exam Begun  Exam Ended   Final [99] 06/15/2016 4:12 PM 06/15/2016 5:40 PM  PACS Images   Show images for CT CARDIAC MORPH/PULM VEIN W/CM&W/O CA SCORE  Addendum   ADDENDUM REPORT: 06/16/2016 09:50  EXAM: OVER-READ INTERPRETATION  CT CHEST  The following report is an over-read performed by radiologist Dr. Rebekah [facility] on 06/16/2016. This over-read does not include interpretation of cardiac or coronary anatomy or pathology. The cardiac  CT interpretation by the cardiologist is attached.  COMPARISON:  None.  FINDINGS: Full description of extracardiac findings on separate dictation for contemporaneously obtained CTA of the chest, abdomen and pelvis 06/15/2016.  IMPRESSION: See separate dictation for contemporaneously obtained CTA of the chest, abdomen and pelvis 06/15/2016 for full description of extracardiac findings.   Electronically Signed   By: Vinnie Langton M.D.   On: 06/16/2016 09:50   Addended by Etheleen Mayhew, MD on 06/16/2016 9:53 AM    Study Result   CLINICAL DATA:  83 year old male with severe aortic stenosis.  EXAM: Cardiac TAVR CT  TECHNIQUE: The patient was scanned on a Philips 256 scanner. A 120 kV retrospective scan was triggered in the descending thoracic aorta at 111 HU's. Gantry rotation speed was 270 msecs and collimation was .9 mm. 20 mg of iv Metoprolol and no nitro were given. The 3D data set was reconstructed in 5% intervals of the R-R cycle. Systolic and diastolic phases were analyzed on a dedicated work station using MPR, MIP and VRT modes. The patient received 80 cc of contrast.  FINDINGS: Aortic Valve: Trileaflet, severely calcified and thickened with severely restricted leaflet opening and minimal calcifications extending into the LVOT.  Aorta:  Normal size, no dissection.  Mild diffuse calcifications.  Sinotubular Junction:  30 x 30 mm  Ascending Thoracic Aorta:  33 x 32 mm  Aortic Arch:  24 x 24 mm  Descending Thoracic Aorta:  24 x 24 mm  Sinus of Valsalva Measurements:  Non-coronary:  37 mm  Right -coronary:  33 mm  Left -coronary:  34 mm  Coronary Artery Height above Annulus:  Left Main:  16 mm  Right Coronary:  24 mm  Virtual Basal Annulus Measurements:  Maximum/Minimum Diameter:  32 x 22 mm  Perimeter:  94 mm  Area:  596 mm2  Optimum Fluoroscopic Angle for Delivery:  RAO 2 CRA 1  Dilated pulmonary artery  measuring 32 x 29 mm  No thrombus in the left atrial appendage.  Normal pulmonary vein drainage into the left atrium.  No ASD/VSD seen.  IMPRESSION: 1. Trileaflet, severely calcified and thickened with severely restricted leaflet opening and minimal calcifications extending into the LVOT. Annular measurements suitable for delivery of a 29 mm Edwards-SAPIEN 3 TAVR valve.  2. Optimum Fluoroscopic Angle for Delivery:  RAO 2 CRA 1  Ena Dawley  Electronically Signed: By: Ena Dawley On: 06/15/2016 18:06       CT Angio Chest/Abd/Pel for Dissection W and/or W/WO (Accession [id number]) (Order [id number])  Imaging  Date: 06/14/2016 Department: [facility] Released By/Authorizing: Sherren Mocha, MD (auto-released)  Exam Information   Status Exam Begun  Exam Ended   Final [99] 06/15/2016 4:12 PM 06/15/2016 5:40 PM  PACS Images   Show images for CT Angio Chest/Abd/Pel for Dissection W and/or W/WO  Study Result   CLINICAL DATA:  83 year old male with history of severe aortic stenosis. Preprocedural study prior to potential transcatheter aortic valve replacement (TAVR).  EXAM: CT ANGIOGRAPHY CHEST, ABDOMEN AND PELVIS  TECHNIQUE: Multidetector CT imaging through the chest, abdomen and pelvis was performed using the standard protocol during bolus administration of intravenous contrast. Multiplanar reconstructed images and MIPs were obtained and reviewed to evaluate the vascular anatomy.  CONTRAST:  80 mL of Isovue 370.  COMPARISON:  No priors.  FINDINGS: CTA CHEST FINDINGS  Cardiovascular: Heart size is mildly enlarged with biatrial dilatation. There is no significant pericardial fluid, thickening or pericardial calcification. There is aortic atherosclerosis, as well as atherosclerosis of the great vessels of the mediastinum and the coronary arteries, including calcified atherosclerotic plaque in the left main, left  anterior descending and left circumflex coronary arteries. Status post median sternotomy for CABG, including LIMA to the LAD.  Mediastinum/Lymph Nodes: Multiple prominent borderline enlarged mediastinal and hilar lymph nodes are noted, likely reactive. Esophagus is unremarkable in appearance. No axillary lymphadenopathy.  Lungs/Pleura: Moderate right and small left pleural effusions lie dependently. Throughout the lungs bilaterally there are relatively symmetric areas of ground-glass attenuation and septal thickening which are most evident throughout the mid to upper lungs. Lower lungs appear relatively normal, with a few patchy areas of ground-glass attenuation.  Musculoskeletal/Soft Tissues: Median sternotomy wires. There are no aggressive appearing lytic or blastic lesions noted in the visualized portions of the skeleton.  CTA ABDOMEN AND PELVIS FINDINGS  Hepatobiliary: Liver has a slightly shrunken appearance and nodular contour, suggestive of underlying cirrhosis. No cystic or solid hepatic lesions. No intra or extrahepatic biliary ductal dilatation. Gallbladder is normal in appearance.  Pancreas: No pancreatic mass. No pancreatic ductal dilatation. No pancreatic or peripancreatic fluid or inflammatory changes.  Spleen: Unremarkable.  Adrenals/Urinary Tract: Bilateral kidneys and bilateral adrenal glands are unremarkable in appearance. No hydroureteronephrosis. Small filling defect in the urinary bladder measuring 0.6 x 1.3 x 1.6 cm (axial image 254 of series 401 and coronal image 108 of series 403), with potential stalk like attachment to the wall of the urinary bladder (coronal image 108 of series 403). There is also some plaque-like irregularity along the posterior aspect of the urinary bladder wall, best appreciated on sagittal image 124 of series 404.  Stomach/Bowel: The appearance of the stomach is normal. There is no pathologic dilatation of small bowel  or colon.  Vascular/Lymphatic: Aortic atherosclerosis, with vascular findings and measurements pertinent to potential TAVR procedure, as detailed below. 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 Throughout the abdominal aorta there multiple ulcerated plaques. The largest of these plaques is best demonstrated in cross-section on axial image 200 of series 401 adjacent to the inferior mesenteric artery origin, where it appears as a short segment dissection (however, this does not appear to be a true dissection). 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 The celiac axis, superior mesenteric artery and inferior mesenteric artery are all patent without definite hemodynamically significant stenosis. Two right-sided and one left-sided renal arteries are all patent without hemodynamically significant stenosis. No lymphadenopathy noted in the abdomen or pelvis.  Reproductive: Prostate gland and seminal vesicles are unremarkable in appearance.  Other: No significant volume of ascites.  No pneumoperitoneum.  Musculoskeletal: There are  no aggressive appearing lytic or blastic lesions noted in the visualized portions of the skeleton.  VASCULAR MEASUREMENTS PERTINENT TO TAVR:  AORTA:  Minimal Aortic Diameter -  19 x 15 mm  Severity of Aortic Calcification -  moderate to severe  RIGHT PELVIS:  Right Common Iliac Artery -  Minimal Diameter - 11.2 x 8.3 mm  Tortuosity - severe  Calcification - moderate  Right External Iliac Artery -  Minimal Diameter - 8.7 x 9.1 mm  Tortuosity - severe  Calcification - mild  Right Common Femoral Artery - Focal aneurysmal dilatation up to 17 mm with large ulcerated plaque.  Minimal Diameter - 8.5  x 6.3 mm  Tortuosity - mild  Calcification - moderate  LEFT PELVIS:  Left Common Iliac Artery -  Minimal Diameter - 8.1 x 7.8 mm  Tortuosity - severe  Calcification - moderate  Left External Iliac Artery -  Minimal Diameter - 9.1 x 8.5 mm  Tortuosity - severe  Calcification - mild  Left Common Femoral Artery -  Minimal Diameter - 8.7 x 7.6 mm  Tortuosity - mild  Calcification - moderate  Review of the MIP images confirms the above findings.  IMPRESSION: 1. Vascular findings and measurements pertinent to potential TAVR procedure, as detailed above. From a size criteria, the patient has suitable pelvic arterial access bilaterally. 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 In addition, there is a large ulcerated plaque in the infrarenal abdominal aorta (adjacent to the origin of the inferior mesenteric artery), best appreciated on axial image 200 of series 401, which in the axial plane has an appearance of a very short segment dissection. Caution should be taken when traversing this region with a catheter. 2. Severe thickening calcification of the aortic valve, compatible with the reported clinical history of severe aortic stenosis. 3. Extensive ground-glass attenuation and septal thickening throughout the mid to upper lungs bilaterally, strongly favored to be related to an acute infection. There may also be a background of very mild interstitial pulmonary edema, best appreciated in the lower lungs. 4. Moderate right and small left pleural effusions lying dependently. 5. Plaque-like irregularity along the posterior aspect of the urinary bladder wall, adjacent to a small filling defect which may have an attachment to the wall of the urinary  bladder. These findings are concerning for potential urothelial neoplasm, and follow-up nonemergent evaluation by Urology is suggested in the near future to better evaluate these findings. 6. Morphologic changes in the liver suggestive of early cirrhosis, as above. 7. Additional incidental findings, as above.   Electronically Signed   By: Vinnie Langton M.D.   On: 06/16/2016 09:01     Impression:  This 83 year old gentleman has stage D severe, symptomatic aortic stenosis with acute on chronic combined systolic and diastolic heart failure, NYHA class III presenting with exertional fatigue, shortness of breath and pre-syncope. I have personally reviewed his echo, cath and CTA images. His echo shows a severely calcified trileaflet aortic valve with severely restricted leaflet mobility. The mean gradient has increased from 43 last year to 54 mm Hg now consistent with severe AS. His LV systolic function has decreased compared to last year. His cath shows patent grafts and no significant ischemic territory. I think AVR is indicated in this patient who is 28 but has been relatively active  until just recently. I think he would be at high risk for open surgical AVR given his age and the redo nature of his surgery, marked deconditioning and poor mobility as not in his PT evaluation. TAVR would be a much better alternative for him. His gated cardiac CT shows anatomy favorable for TAVR using a 29 mm Sapien 3 valve. His abdominal and pelvic CT shows adequate pelvic arterial anatomy to allow a transfemoral insertion. There is some ulcerated plaque and aneurysmal dilation of the distal right common femoral artery but we would be above that. There is extensive ground-glass density throughout the mid to upper lungs bilaterally of unclear etiology. This is favored to be infection by radiology but could also be edema. There is moderate right and small left pleural effusions. There is also some irregularity in the  posterior wall of the bladder that will require urology evaluation later.  He has poor dentition and has not seen a dentist in many years so he will require a dental evaluation preop. I am concerned about the densities in his lungs on CT yesterday and that will need to be cleared up prior to surgery. He does not have any fever and his WBC ct is only mildly elevated but he does report some cough productive of yellow sputum. I will leave this up to his medical team to sort out. I certainly think he needs to remain in the hospital to clear up these issues prior to TAVR given his age and the critical degree of AS presenting with pre-syncope.   The patient was counseled at length regarding treatment alternatives for management of severe symptomatic aortic stenosis. The risks and benefits of surgical intervention has been discussed in detail. Long-term prognosis with medical therapy was discussed. Alternative approaches such as conventional surgical aortic valve replacement, transcatheter aortic valve replacement, and palliative medical therapy were compared and contrasted at length. This discussion was placed in the context of the patient's own specific clinical presentation and past medical history. All of their questions have been addressed. The patient is eager to proceed with surgical management as soon as possible.   Following the decision to proceed with transcatheter aortic valve replacement, a discussion was held regarding what types of management strategies would be attempted intraoperatively in the event of life-threatening complications, including whether or not the patient would be considered a candidate for the use of cardiopulmonary bypass and/or conversion to open sternotomy for attempted surgical intervention. The patient is aware of the fact that transient use of cardiopulmonary bypass may be necessary, but the patient specifically states that he would not wish to undergo redo median sternotomy  under any circumstances, even if he were to develop potentially lethal complications related to transcatheter valve placement.   The patient has been advised of a variety of complications that might develop including but not limited to risks of death, stroke, paravalvular leak, aortic dissection or other major vascular complications, aortic annulus rupture, device embolization, cardiac rupture or perforation, mitral regurgitation, acute myocardial infarction, arrhythmia, heart block or bradycardia requiring permanent pacemaker placement, congestive heart failure, respiratory failure, renal failure, pneumonia, infection, other late complications related to structural valve deterioration or migration, or other complications that might ultimately cause a temporary or permanent loss of functional independence or other long term morbidity. The patient provides full informed consent for the procedure as described and all questions were answered.     Plan:  Will get orthopantogram and consult Dr. Enrique Sack which will not happen until Monday since  it is Friday afternoon now. Follow up on his PFT's. His pulmonary findings on CXR since admission and CT yesterday are concerning and it is unclear if that is related to his severe AS and CHF or related to some infection. I will discuss his case with the other members of the structural heart valve team and decide on the timing of TAVR.  I spent 80 minutes performing this consultation and > 50% of this time was spent face to face counseling and coordinating the care of this patient's severe aortic stenosis.    Gaye Pollack, MD 06/16/2016 11:32 AM

## 2016-06-16 NOTE — Progress Notes (Signed)
ANTICOAGULATION CONSULT NOTE - Initial Consult  Pharmacy Consult for heparin Indication: atrial fibrillation  No Known Allergies  Patient Measurements: Height: 5\' 9"  (175.3 cm) Weight: 186 lb 9.6 oz (84.6 kg) IBW/kg (Calculated) : 70.7 Heparin Dosing Weight: 84.6 kg  Vital Signs: Temp: 98.4 F (36.9 C) (03/02 1248) Temp Source: Oral (03/02 1248) BP: 114/85 (03/02 1248) Pulse Rate: 64 (03/02 1248)  Labs:  Recent Labs  06/14/16 0654 06/15/16 0410 06/16/16 0437  HGB 11.2* 11.4* 10.8*  HCT 32.7* 33.8* 32.4*  PLT 335 369 349  CREATININE  --   --  0.67    Estimated Creatinine Clearance: 68.7 mL/min (by C-G formula based on SCr of 0.67 mg/dL).  Assessment: 25 YOM on pradaxa for afib, now to transition to IV heparin for possible TAVR. Last pradaxa dose was this AM at 0900. crcl ~ 65-70 ml/min  Goal of Therapy:  Heparin level 0.3-0.7 units/ml Monitor platelets by anticoagulation protocol: Yes   Plan:  Start IV heparin 1200 units/hr with no bolus at 2100 F/u 8 hr heparin level at 0500 Daily heparin level and CBC  Maryanna Shape, PharmD, BCPS  Clinical Pharmacist  Pager: [phone]   06/16/2016,3:21 PM

## 2016-06-17 LAB — CBC WITH DIFFERENTIAL/PLATELET
BASOS ABS: 0 10*3/uL (ref 0.0–0.1)
BASOS PCT: 0 %
EOS ABS: 0.5 10*3/uL (ref 0.0–0.7)
Eosinophils Relative: 6 %
HCT: 32 % — ABNORMAL LOW (ref 39.0–52.0)
HEMOGLOBIN: 10.8 g/dL — AB (ref 13.0–17.0)
LYMPHS ABS: 1.4 10*3/uL (ref 0.7–4.0)
Lymphocytes Relative: 15 %
MCH: 32 pg (ref 26.0–34.0)
MCHC: 33.8 g/dL (ref 30.0–36.0)
MCV: 94.7 fL (ref 78.0–100.0)
Monocytes Absolute: 0.9 10*3/uL (ref 0.1–1.0)
Monocytes Relative: 10 %
NEUTROS PCT: 69 %
Neutro Abs: 6.3 10*3/uL (ref 1.7–7.7)
Platelets: 363 10*3/uL (ref 150–400)
RBC: 3.38 MIL/uL — AB (ref 4.22–5.81)
RDW: 12.5 % (ref 11.5–15.5)
WBC: 9.1 10*3/uL (ref 4.0–10.5)

## 2016-06-17 LAB — BASIC METABOLIC PANEL
ANION GAP: 9 (ref 5–15)
BUN: 10 mg/dL (ref 6–20)
CHLORIDE: 98 mmol/L — AB (ref 101–111)
CO2: 27 mmol/L (ref 22–32)
Calcium: 9 mg/dL (ref 8.9–10.3)
Creatinine, Ser: 0.73 mg/dL (ref 0.61–1.24)
Glucose, Bld: 99 mg/dL (ref 65–99)
POTASSIUM: 3.8 mmol/L (ref 3.5–5.1)
SODIUM: 134 mmol/L — AB (ref 135–145)

## 2016-06-17 LAB — HEPARIN LEVEL (UNFRACTIONATED): HEPARIN UNFRACTIONATED: 0.38 [IU]/mL (ref 0.30–0.70)

## 2016-06-17 NOTE — Progress Notes (Signed)
ANTICOAGULATION CONSULT NOTE - Follow Up Consult  Pharmacy Consult for Heparin (Pradaxa on hold) Indication: atrial fibrillation  Assessment: 31 yoM on heparin for afib while Pradaxa on hold, in anticipation of TAVR. Heparin level is 0.38 (therapeutic) on 1500 units/hr. Hemoglobin is low/stable, platelets wnl. No overt bleeding noted.   Goal of Therapy:  Heparin level 0.3-0.7 units/ml Monitor platelets by anticoagulation protocol: Yes   Plan:  -Continue heparin at 1500 units/hr -Monitor heparin level daily, CBC, s/sx bleeding   No Known Allergies  Patient Measurements: Height: 5\' 9"  (175.3 cm) Weight: 171 lb 1.6 oz (77.6 kg) IBW/kg (Calculated) : 70.7  Vital Signs: Temp: 98.9 F (37.2 C) (03/03 1400) Temp Source: Oral (03/03 1400) BP: 102/68 (03/03 1356) Pulse Rate: 55 (03/03 1356)  Labs:  Recent Labs  06/15/16 0410 06/16/16 0437 06/17/16 0350 06/17/16 1346  HGB 11.4* 10.8* 10.8*  --   HCT 33.8* 32.4* 32.0*  --   PLT 369 349 363  --   HEPARINUNFRC  --   --  <0.10* 0.38  CREATININE  --  0.67 0.73  --     Estimated Creatinine Clearance: 68.7 mL/min (by C-G formula based on SCr of 0.73 mg/dL).  Belia Heman, PharmD PGY1 Pharmacy Resident [phone] (Pager) 06/17/2016 2:52 PM

## 2016-06-17 NOTE — Progress Notes (Signed)
PROGRESS NOTE  Richard Simon [id number] DOB: October 14, 1929 DOA: 06/07/2016   PCP: Merrilee Seashore, MD   LOS: 10 days   Brief Narrative: 83 y.o. male with medical history significant of atrial fibrillation on Pradaxa, CAD, CABG, hyperlipidemia, hypertension, moderate to severe aortic stenosis is coming to the emergency department with complaints of on/off chest pressure, radiates to his back, improved by rest for the past week associated with dyspnea, orthopnea and fatigue.  He was found to be hyponatremic in the ED.  He was felt to be dehydrated, and received gentle IV hydration, which resulted in improvement in his sodium however he developed fluid overload quite rapidly.  2D echo later showed critical aortic stenosis and cardiology was consulted, patient was started on IV Lasix, his sodium has improved and cardiology is currently evaluating for TAVR.  Assessment & Plan:  Chest pain - 2D echo done on 2/22 showed an ejection fraction of 45-50%, with mildly reduced systolic function, with diffuse hypokinesis and critical aortic valve stenosis.  Valve area was calculated at 0.26 cm.  Prior to the echo done in April 2017 showed normal EF of 0000000, normal systolic function, with a valve area of 0.55 - per cardiology may need TAVR - no chest pain this AM  - appreciate cardiothoracic surgery assistance   Stage D severe and symptomatic AS with Acute on chronic combined diastolic and systolic CHF, NYHA class III - echo shows a severely calcified trileaflet aortic valve with severely restricted leaflet mobility - LV systolic function has decreased compared to last year - cath shows patent grafts and no significant ischemic territory - per vascular surgery, TAVR to be considered but vascular surgery recommends that pt is clear from pulmonary stand point  - will also need orthopantogram and consult Dr. Enrique Sack, pending for Monday  - I will ask PCCM to consult on pt as well to provide additional  recommendations   CAP - Completed 7 days therapy with ABX but opacities still seen on imaging studies  - will ask PCCM to review and provide pre op clearance for pt  - no fevers and WBC is now WNL  Hyponatremia - Patient was initially hypovolemic and his sodium improved with gentle hydration, however he developed fluid overload rather rapidly due to his critical aortic stenosis, with worsening hyponatremia.   - He required IV Lasix, with improvement in his respiratory status as well as improvement in his sodium levels. - Na overall improving, BMP in AM  Plaque-like irregularity along the posterior aspect of the urinary bladder wall - adjacent to a small filling defect which may have an attachment to the wall of the urinary bladder - findings are concerning for potential urothelial neoplasm, and follow-up nonemergent evaluation by Urology is suggested in the near future to better evaluate these findings - when pt ready for discharge will set up follow up appointment   Acute encephalopathy - Patient with intermittent confusion, this is likely in the setting of infectious process, as well as probable delirium related to inpatient stay - still with intermittent confusion but appears to be stable at baseline   CAD (coronary artery disease) - Continue home medications, cardiology evaluating  Essential hypertension - Blood pressure controlled 113/68, continue current regimen  Dyslipidemia, goal LDL below 70 - Continue Crestor  Atrial fibrillation, permanent (HCC) - CHA2DS2-VASc Score of at least 4. - Continue atenolol for rate control, on Heparin drip for now   DVT prophylaxis: SCDs, Heparin drip  Code Status: Full code Family Communication: no family  at bedside  Disposition Plan: remains inpatient until surgery done   Consultants:   Cardiology  Cardiothoracic surgery   PCCM  Procedures:   2D echo Study Conclusions - Left ventricle: The cavity size was normal. Wall  thickness was increased in a pattern of mild LVH. Systolic function was normal. The estimated ejection fraction was in the range of 55% to 60%. Wall motion was normal; there were no regional wall motion abnormalities. - Aortic valve: Valve mobility was restricted. There was severe stenosis. There was moderate regurgitation. Peak velocity (S): 451 cm/s. Mean gradient (S): 43 mm Hg. - Mitral valve: Moderately calcified annulus. There was mild regurgitation. - Left atrium: The atrium was moderately dilated. - Pulmonary arteries: Systolic pressure was mildly increased. PA peak pressure: 32 mm Hg (S).  Impressions: - Mean gradient 71mmHg from prior echocardiogram. Aortic stenosis has increased.  Antimicrobials:  Ceftriaxone 2/21 >> 2/28  azithromycin 2/21 >> 2/28  Subjective: No events overnight.   Objective: Vitals:   06/17/16 1033 06/17/16 1229 06/17/16 1356 06/17/16 1400  BP: 98/60 109/82 102/68   Pulse: (!) 110  (!) 55   Resp:  (!) 21 18   Temp:   98.7 F (37.1 C) 98.9 F (37.2 C)  TempSrc:   Oral Oral  SpO2:   97% 98%  Weight:      Height:        Intake/Output Summary (Last 24 hours) at 06/17/16 1445 Last data filed at 06/17/16 1215  Gross per 24 hour  Intake            805.3 ml  Output              700 ml  Net            105.3 ml   Filed Weights   06/15/16 0455 06/16/16 0500 06/17/16 0600  Weight: 80.5 kg (177 lb 8 oz) 84.6 kg (186 lb 9.6 oz) 77.6 kg (171 lb 1.6 oz)    Examination: Constitutional: NAD Vitals:   06/17/16 1033 06/17/16 1229 06/17/16 1356 06/17/16 1400  BP: 98/60 109/82 102/68   Pulse: (!) 110  (!) 55   Resp:  (!) 21 18   Temp:   98.7 F (37.1 C) 98.9 F (37.2 C)  TempSrc:   Oral Oral  SpO2:   97% 98%  Weight:      Height:       Eyes: PERRL, lids and conjunctivae normal Respiratory: diminished breath sounds at bases   Musculoskeletal: no clubbing / cyanosis.  Neurologic: CN 2-12 grossly intact. Strength 5/5 in all 4.  Psychiatric: Alert  and oriented 3  Data Reviewed: I have personally reviewed following labs and imaging studies  CBC:  Recent Labs Lab 06/13/16 0352 06/14/16 0654 06/15/16 0410 06/16/16 0437 06/17/16 0350  WBC 9.4 12.6* 14.3* 12.9* 9.1  NEUTROABS 6.9 10.0* 11.7* 10.0* 6.3  HGB 11.0* 11.2* 11.4* 10.8* 10.8*  HCT 32.0* 32.7* 33.8* 32.4* 32.0*  MCV 93.6 94.5 94.2 95.6 94.7  PLT 310 335 369 349 [id number]   Basic Metabolic Panel:  Recent Labs Lab 06/11/16 0435 06/12/16 0257 06/13/16 0352 06/16/16 0437 06/17/16 0350  NA 124* 126* 130* 133* 134*  K 3.6 2.9* 4.1 3.8 3.8  CL 87* 88* 93* 98* 98*  CO2 28 29 29 26 27   GLUCOSE 119* 115* 109* 106* 99  BUN 15 11 11 7 10   CREATININE 0.70 0.65 0.69 0.67 0.73  CALCIUM 8.7* 8.6* 8.8* 8.9 9.0   Coagulation Profile:  Recent Labs Lab 06/11/16 0435  INR 1.29    Recent Results (from the past 240 hour(s))  MRSA PCR Screening     Status: None   Collection Time: 06/07/16  3:05 PM  Result Value Ref Range Status   MRSA by PCR NEGATIVE NEGATIVE Final    Comment:        The GeneXpert MRSA Assay (FDA approved for NASAL specimens only), is one component of a comprehensive MRSA colonization surveillance program. It is not intended to diagnose MRSA infection nor to guide or monitor treatment for MRSA infections.     Radiology Studies: Dg Orthopantogram  Result Date: 06/16/2016 CLINICAL DATA:  Aortic stenosis. EXAM: ORTHOPANTOGRAM/PANORAMIC COMPARISON:  CT 01/03/2009. FINDINGS: Mild periapical lucency lucency is noted over the left lower most posterior remaining tooth. Subtle lucencies noted over the posterior most aspect of the left inferior mandibular ramus. Dental evaluation suggested. No other focal bony abnormalities identified. No evidence of fracture. IMPRESSION: Mild periapical lucency noted over the left lower most posterior remaining tooth. Subtle lucencies noted over the posterior aspect of the left inferior mandibular ramus. Dental evaluation  suggested. Electronically Signed   By: Marcello Moores  Register   On: 06/16/2016 15:20   Ct Cardiac Morph/pulm Vein W/cm&w/o Ca Score  Addendum Date: 06/16/2016   ADDENDUM REPORT: 06/16/2016 09:50 EXAM: OVER-READ INTERPRETATION  CT CHEST The following report is an over-read performed by radiologist Dr. Rebekah Chesterfield [facility] [place] on 06/16/2016. This over-read does not include interpretation of cardiac or coronary anatomy or pathology. The cardiac CT interpretation by the cardiologist is attached. COMPARISON:  None. FINDINGS: Full description of extracardiac findings on separate dictation for contemporaneously obtained CTA of the chest, abdomen and pelvis 06/15/2016. IMPRESSION: See separate dictation for contemporaneously obtained CTA of the chest, abdomen and pelvis 06/15/2016 for full description of extracardiac findings. Electronically Signed   By: Vinnie Langton M.D.   On: 06/16/2016 09:50   Result Date: 06/16/2016 CLINICAL DATA:  83 year old male with severe aortic stenosis. EXAM: Cardiac TAVR CT TECHNIQUE: The patient was scanned on a Philips 256 scanner. A 120 kV retrospective scan was triggered in the descending thoracic aorta at 111 HU's. Gantry rotation speed was 270 msecs and collimation was .9 mm. 20 mg of iv Metoprolol and no nitro were given. The 3D data set was reconstructed in 5% intervals of the R-R cycle. Systolic and diastolic phases were analyzed on a dedicated work station using MPR, MIP and VRT modes. The patient received 80 cc of contrast. FINDINGS: Aortic Valve: Trileaflet, severely calcified and thickened with severely restricted leaflet opening and minimal calcifications extending into the LVOT. Aorta:  Normal size, no dissection.  Mild diffuse calcifications. Sinotubular Junction:  30 x 30 mm Ascending Thoracic Aorta:  33 x 32 mm Aortic Arch:  24 x 24 mm Descending Thoracic Aorta:  24 x 24 mm Sinus of Valsalva Measurements: Non-coronary:  37 mm Right -coronary:  33 mm Left  -coronary:  34 mm Coronary Artery Height above Annulus: Left Main:  16 mm Right Coronary:  24 mm Virtual Basal Annulus Measurements: Maximum/Minimum Diameter:  32 x 22 mm Perimeter:  94 mm Area:  596 mm2 Optimum Fluoroscopic Angle for Delivery:  RAO 2 CRA 1 Dilated pulmonary artery measuring 32 x 29 mm No thrombus in the left atrial appendage. Normal pulmonary vein drainage into the left atrium. No ASD/VSD seen. IMPRESSION: 1. Trileaflet, severely calcified and thickened with severely restricted leaflet opening and minimal calcifications extending into the LVOT. Annular measurements suitable  for delivery of a 29 mm Edwards-SAPIEN 3 TAVR valve. 2. Optimum Fluoroscopic Angle for Delivery:  RAO 2 CRA 1 Ena Dawley Electronically Signed: By: Ena Dawley On: 06/15/2016 18:06   Ct Angio Chest/abd/pel For Dissection W And/or W/wo  Result Date: 06/16/2016 CLINICAL DATA:  83 year old male with history of severe aortic stenosis. Preprocedural study prior to potential transcatheter aortic valve replacement (TAVR). EXAM: CT ANGIOGRAPHY CHEST, ABDOMEN AND PELVIS TECHNIQUE: Multidetector CT imaging through the chest, abdomen and pelvis was performed using the standard protocol during bolus administration of intravenous contrast. Multiplanar reconstructed images and MIPs were obtained and reviewed to evaluate the vascular anatomy. CONTRAST:  80 mL of Isovue 370. COMPARISON:  No priors. FINDINGS: CTA CHEST FINDINGS Cardiovascular: Heart size is mildly enlarged with biatrial dilatation. There is no significant pericardial fluid, thickening or pericardial calcification. There is aortic atherosclerosis, as well as atherosclerosis of the great vessels of the mediastinum and the coronary arteries, including calcified atherosclerotic plaque in the left main, left anterior descending and left circumflex coronary arteries. Status post median sternotomy for CABG, including LIMA to the LAD. Mediastinum/Lymph Nodes: Multiple  prominent borderline enlarged mediastinal and hilar lymph nodes are noted, likely reactive. Esophagus is unremarkable in appearance. No axillary lymphadenopathy. Lungs/Pleura: Moderate right and small left pleural effusions lie dependently. Throughout the lungs bilaterally there are relatively symmetric areas of ground-glass attenuation and septal thickening which are most evident throughout the mid to upper lungs. Lower lungs appear relatively normal, with a few patchy areas of ground-glass attenuation. Musculoskeletal/Soft Tissues: Median sternotomy wires. There are no aggressive appearing lytic or blastic lesions noted in the visualized portions of the skeleton. CTA ABDOMEN AND PELVIS FINDINGS Hepatobiliary: Liver has a slightly shrunken appearance and nodular contour, suggestive of underlying cirrhosis. No cystic or solid hepatic lesions. No intra or extrahepatic biliary ductal dilatation. Gallbladder is normal in appearance. Pancreas: No pancreatic mass. No pancreatic ductal dilatation. No pancreatic or peripancreatic fluid or inflammatory changes. Spleen: Unremarkable. Adrenals/Urinary Tract: Bilateral kidneys and bilateral adrenal glands are unremarkable in appearance. No hydroureteronephrosis. Small filling defect in the urinary bladder measuring 0.6 x 1.3 x 1.6 cm (axial image 254 of series 401 and coronal image 108 of series 403), with potential stalk like attachment to the wall of the urinary bladder (coronal image 108 of series 403). There is also some plaque-like irregularity along the posterior aspect of the urinary bladder wall, best appreciated on sagittal image 124 of series 404. Stomach/Bowel: The appearance of the stomach is normal. There is no pathologic dilatation of small bowel or colon. Vascular/Lymphatic: Aortic atherosclerosis, with vascular findings and measurements pertinent to potential TAVR procedure, as detailed below. 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 Throughout the abdominal aorta there multiple ulcerated plaques. The largest of these plaques is best demonstrated in cross-section on axial image 200 of series 401 adjacent to the inferior mesenteric artery origin, where it appears as a short segment dissection (however, this does not appear to be a true dissection). 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 The celiac axis, superior mesenteric artery and inferior mesenteric artery are all patent without definite hemodynamically significant stenosis. Two right-sided and one left-sided renal arteries are all patent without hemodynamically significant stenosis. No lymphadenopathy noted in the abdomen or  pelvis. Reproductive: Prostate gland and seminal vesicles are unremarkable in appearance. Other: No significant volume of ascites.  No pneumoperitoneum. Musculoskeletal: There are no aggressive appearing lytic or blastic lesions noted in the visualized portions of the skeleton. VASCULAR MEASUREMENTS PERTINENT TO TAVR: AORTA: Minimal Aortic Diameter -  19 x 15 mm Severity of Aortic Calcification -  moderate to severe RIGHT PELVIS: Right Common Iliac Artery - Minimal Diameter - 11.2 x 8.3 mm Tortuosity - severe Calcification - moderate Right External Iliac Artery - Minimal Diameter - 8.7 x 9.1 mm Tortuosity - severe Calcification - mild Right Common Femoral Artery - Focal aneurysmal dilatation up to 17 mm with large ulcerated plaque. Minimal Diameter - 8.5 x 6.3 mm Tortuosity - mild Calcification - moderate LEFT PELVIS: Left Common Iliac Artery - Minimal Diameter - 8.1 x 7.8 mm Tortuosity - severe Calcification - moderate Left External Iliac Artery - Minimal Diameter - 9.1 x 8.5 mm Tortuosity - severe Calcification - mild Left Common Femoral Artery -  Minimal Diameter - 8.7 x 7.6 mm Tortuosity - mild Calcification - moderate Review of the MIP images confirms the above findings. IMPRESSION: 1. Vascular findings and measurements pertinent to potential TAVR procedure, as detailed above. From a size criteria, the patient has suitable pelvic arterial access bilaterally. 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 In addition, there is a large ulcerated plaque in the infrarenal abdominal aorta (adjacent to the origin of the inferior mesenteric artery), best appreciated on axial image 200 of series 401, which in the axial plane has an appearance of a very short segment dissection. Caution should be taken when traversing this region with a catheter. 2. Severe thickening calcification of the aortic valve, compatible with the reported clinical history of severe aortic stenosis. 3. Extensive ground-glass attenuation and septal thickening throughout the mid to upper lungs bilaterally, strongly favored to be related to an acute infection. There may also be a background of very mild interstitial pulmonary edema, best appreciated in the lower lungs. 4. Moderate right and small left pleural effusions lying dependently. 5. Plaque-like irregularity along the posterior aspect of the urinary bladder wall, adjacent to a small filling defect which may have an attachment to the wall of the urinary bladder. These findings are concerning for potential urothelial neoplasm, and follow-up nonemergent evaluation by Urology is suggested in the near future to better evaluate these findings. 6. Morphologic changes in the liver suggestive of early cirrhosis, as above. 7. Additional incidental findings, as above. Electronically Signed   By: Vinnie Langton M.D.   On: 06/16/2016 09:01     Scheduled Meds: . aspirin EC  81 mg Oral Daily  . atenolol  75 mg Oral Daily  . diltiazem  240 mg Oral Daily  . docusate sodium  100 mg Oral BID  . rosuvastatin  10 mg Oral Daily  . sodium chloride flush  3 mL Intravenous Q12H  . sodium chloride flush  3 mL Intravenous Q12H   Continuous Infusions: . heparin 1,500 Units/hr (06/17/16 0552)   Faye Ramsay, MD  [facility] Pager [phone]  If 7PM-7AM, please contact night-coverage [email or web address] Password [email or web address] 06/17/2016, 2:45 PM

## 2016-06-17 NOTE — Progress Notes (Signed)
Subjective:  No complaints of shortness of breath today, he wants to get out and walk.  Objective:  Vital Signs in the last 24 hours: BP 98/60   Pulse (!) 110   Temp 98.2 F (36.8 C) (Oral)   Resp 15   Ht 5\' 9"  (1.753 m)   Wt 77.6 kg (171 lb 1.6 oz)   SpO2 98%   BMI 25.27 kg/m   Physical Exam: Elderly pleasant male in no acute distress Lungs:  Clear Cardiac:  Irregular rhythm, normal S1 and S2, harsh 2 to 3/6 systolic murmur at aortic area with diminished S2 Abdomen:  Soft, nontender, no masses Extremities:  No edema present  Intake/Output from previous day: 03/02 0701 - 03/03 0700 In: 745.3 [P.O.:640; I.V.:105.3] Out: 700 [Urine:700]  Weight Filed Weights   06/15/16 0455 06/16/16 0500 06/17/16 0600  Weight: 80.5 kg (177 lb 8 oz) 84.6 kg (186 lb 9.6 oz) 77.6 kg (171 lb 1.6 oz)    Lab Results: Basic Metabolic Panel:  Recent Labs  06/16/16 0437 06/17/16 0350  NA 133* 134*  K 3.8 3.8  CL 98* 98*  CO2 26 27  GLUCOSE 106* 99  BUN 7 10  CREATININE 0.67 0.73   CBC:  Recent Labs  06/16/16 0437 06/17/16 0350  WBC 12.9* 9.1  NEUTROABS 10.0* 6.3  HGB 10.8* 10.8*  HCT 32.4* 32.0*  MCV 95.6 94.7  PLT 349 363   Telemetry: Personally reviewed.  Atrial fibrillation with controlled ventricular response  Assessment/Plan:  1.  Critical aortic stenosis symptomatic 2.  Persistent atrial fibrillation 3.  Chronic diastolic heart failure due to aortic stenosis age and atrial fibrillation 4.  CAD with previous bypass grafting  Recommendations:  Has been switched to heparin to allow for dental work.  He will have TAVR done as an inpatient.     Kerry Hough  MD [facility]  06/17/2016, 11:42 AM

## 2016-06-17 NOTE — Progress Notes (Signed)
ANTICOAGULATION CONSULT NOTE - Follow Up Consult  Pharmacy Consult for Heparin (Pradaxa on hold) Indication: atrial fibrillation  No Known Allergies  Patient Measurements: Height: 5\' 9"  (175.3 cm) Weight: 180 lb 14.4 oz (82.1 kg) IBW/kg (Calculated) : 70.7  Vital Signs: Temp: 98.2 F (36.8 C) (03/03 0440) Temp Source: Oral (03/03 0440) BP: 105/66 (03/03 0440) Pulse Rate: 57 (03/03 0440)  Labs:  Recent Labs  06/15/16 0410 06/16/16 0437 06/17/16 0350  HGB 11.4* 10.8* 10.8*  HCT 33.8* 32.4* 32.0*  PLT 369 349 363  HEPARINUNFRC  --   --  <0.10*  CREATININE  --  0.67 0.73    Estimated Creatinine Clearance: 68.7 mL/min (by C-G formula based on SCr of 0.73 mg/dL).   Assessment: Heparin for afib while Pradaxa on hold in anticipation of TAVR, heparin level undetectable, no issues per RN.   Goal of Therapy:  Heparin level 0.3-0.7 units/ml Monitor platelets by anticoagulation protocol: Yes   Plan:  -Inc heparin to 1500 units/hr -1400 HL  Saphronia Ozdemir 06/17/2016,5:52 AM

## 2016-06-18 LAB — CBC WITH DIFFERENTIAL/PLATELET
BASOS PCT: 0 %
Basophils Absolute: 0 10*3/uL (ref 0.0–0.1)
Eosinophils Absolute: 0.3 10*3/uL (ref 0.0–0.7)
Eosinophils Relative: 4 %
HEMATOCRIT: 33.5 % — AB (ref 39.0–52.0)
HEMOGLOBIN: 11.5 g/dL — AB (ref 13.0–17.0)
LYMPHS ABS: 1.9 10*3/uL (ref 0.7–4.0)
Lymphocytes Relative: 21 %
MCH: 32.3 pg (ref 26.0–34.0)
MCHC: 34.3 g/dL (ref 30.0–36.0)
MCV: 94.1 fL (ref 78.0–100.0)
Monocytes Absolute: 0.6 10*3/uL (ref 0.1–1.0)
Monocytes Relative: 7 %
NEUTROS ABS: 6.4 10*3/uL (ref 1.7–7.7)
NEUTROS PCT: 68 %
Platelets: 367 10*3/uL (ref 150–400)
RBC: 3.56 MIL/uL — AB (ref 4.22–5.81)
RDW: 12.6 % (ref 11.5–15.5)
WBC: 9.3 10*3/uL (ref 4.0–10.5)

## 2016-06-18 LAB — BASIC METABOLIC PANEL
Anion gap: 11 (ref 5–15)
BUN: 8 mg/dL (ref 6–20)
CHLORIDE: 97 mmol/L — AB (ref 101–111)
CO2: 24 mmol/L (ref 22–32)
Calcium: 8.9 mg/dL (ref 8.9–10.3)
Creatinine, Ser: 0.7 mg/dL (ref 0.61–1.24)
GFR calc Af Amer: >60 mL/min (ref >60)
GFR calc non Af Amer: >60 mL/min (ref >60)
Glucose, Bld: 108 mg/dL — ABNORMAL HIGH (ref 65–99)
POTASSIUM: 3.8 mmol/L (ref 3.5–5.1)
Sodium: 132 mmol/L — ABNORMAL LOW (ref 135–145)

## 2016-06-18 LAB — HEPARIN LEVEL (UNFRACTIONATED): Heparin Unfractionated: 0.43 IU/mL (ref 0.30–0.70)

## 2016-06-18 MED ORDER — DIGOXIN 0.25 MG/ML IJ SOLN
0.2500 mg | Freq: Four times a day (QID) | INTRAMUSCULAR | Status: AC
  Administered 2016-06-18 (×2): 0.25 mg via INTRAVENOUS
  Filled 2016-06-18 (×2): qty 2

## 2016-06-18 NOTE — Progress Notes (Signed)
PROGRESS NOTE  Richard Simon [id number] DOB: 1929/08/12 DOA: 06/07/2016   PCP: Merrilee Seashore, MD   LOS: 11 days   Brief Narrative: 83 y.o. male with medical history significant of atrial fibrillation on Pradaxa, CAD, CABG, hyperlipidemia, hypertension, moderate to severe aortic stenosis is coming to the emergency department with complaints of on/off chest pressure, radiates to his back, improved by rest for the past week associated with dyspnea, orthopnea and fatigue.  He was found to be hyponatremic in the ED.  He was felt to be dehydrated, and received gentle IV hydration, which resulted in improvement in his sodium however he developed fluid overload quite rapidly.  2D echo later showed critical aortic stenosis and cardiology was consulted, patient was started on IV Lasix, his sodium has improved and cardiology is currently evaluating for TAVR.  Assessment & Plan:  Chest pain - 2D echo done on 2/22 showed an ejection fraction of 45-50%, with mildly reduced systolic function, with diffuse hypokinesis and critical aortic valve stenosis.  Valve area was calculated at 0.26 cm.  Prior to the echo done in April 2017 showed normal EF of 0000000, normal systolic function, with a valve area of 0.55 - per cardiology may need TAVR - no chest pain this AM  - appreciate cardiothoracic surgery assistance   Stage D severe and symptomatic AS with Acute on chronic combined diastolic and systolic CHF, NYHA class III - echo shows a severely calcified trileaflet aortic valve with severely restricted leaflet mobility - LV systolic function has decreased compared to last year - cath shows patent grafts and no significant ischemic territory - per vascular surgery, TAVR to be considered but vascular surgery recommends that pt is clear from pulmonary stand point  - will also need orthopantogram and consult Dr. Enrique Sack, pending for Monday  - will ask PCCM to consult on pt as well to provide additional  recommendations   CAP - Completed 7 days therapy with ABX but opacities still seen on imaging studies  - will ask PCCM to review and provide pre op clearance for pt  - no fevers and WBC is now WNL  Hyponatremia - Patient was initially hypovolemic and his sodium improved with gentle hydration, however he developed fluid overload rather rapidly due to his critical aortic stenosis, with worsening hyponatremia.   - He required IV Lasix, with improvement in his respiratory status as well as improvement in his sodium levels. - Na overall 132 - 134   Plaque-like irregularity along the posterior aspect of the urinary bladder wall - adjacent to a small filling defect which may have an attachment to the wall of the urinary bladder - findings are concerning for potential urothelial neoplasm, and follow-up nonemergent evaluation by Urology is suggested in the near future to better evaluate these findings - when pt ready for discharge will set up follow up appointment   Acute encephalopathy - Patient with intermittent confusion, this is likely in the setting of infectious process, as well as probable delirium related to inpatient stay - appears to be stable at baseline   CAD (coronary artery disease) - Continue home medications, cardiology following   Essential hypertension - Blood pressure controlled 113/68, continue current regimen  Dyslipidemia, goal LDL below 70 - Continue Crestor  Atrial fibrillation, permanent (HCC) - CHA2DS2-VASc Score of at least 4. - Continue atenolol for rate control, on Heparin drip for now  - per cardiology, added digoxin as HR at rest in 110's  DVT prophylaxis: SCDs, Heparin drip  Code Status:  Full code Family Communication: no family at bedside  Disposition Plan: remains inpatient until surgery done   Consultants:   Cardiology  Cardiothoracic surgery   PCCM  Procedures:   2D echo Study Conclusions - Left ventricle: The cavity size was normal.  Wall thickness was increased in a pattern of mild LVH. Systolic function was normal. The estimated ejection fraction was in the range of 55% to 60%. Wall motion was normal; there were no regional wall motion abnormalities. - Aortic valve: Valve mobility was restricted. There was severe stenosis. There was moderate regurgitation. Peak velocity (S): 451 cm/s. Mean gradient (S): 43 mm Hg. - Mitral valve: Moderately calcified annulus. There was mild regurgitation. - Left atrium: The atrium was moderately dilated. - Pulmonary arteries: Systolic pressure was mildly increased. PA peak pressure: 32 mm Hg (S).  Impressions: - Mean gradient 7mmHg from prior echocardiogram. Aortic stenosis has increased.  Antimicrobials:  Ceftriaxone 2/21 >> 2/28  azithromycin 2/21 >> 2/28  Subjective: No events overnight.   Objective: Vitals:   06/17/16 1400 06/17/16 2100 06/18/16 0500 06/18/16 0921  BP:  132/90 117/74 102/68  Pulse:  95 91 (!) 117  Resp:  18 18   Temp: 98.9 F (37.2 C) 98.6 F (37 C) 98.9 F (37.2 C)   TempSrc: Oral Oral Oral   SpO2: 98% 99% 96%   Weight:   77.8 kg (171 lb 9.6 oz)   Height:        Intake/Output Summary (Last 24 hours) at 06/18/16 1429 Last data filed at 06/18/16 0600  Gross per 24 hour  Intake            431.5 ml  Output             1650 ml  Net          -1218.5 ml   Filed Weights   06/16/16 0500 06/17/16 0600 06/18/16 0500  Weight: 84.6 kg (186 lb 9.6 oz) 77.6 kg (171 lb 1.6 oz) 77.8 kg (171 lb 9.6 oz)    Examination: Constitutional: NAD Vitals:   06/17/16 1400 06/17/16 2100 06/18/16 0500 06/18/16 0921  BP:  132/90 117/74 102/68  Pulse:  95 91 (!) 117  Resp:  18 18   Temp: 98.9 F (37.2 C) 98.6 F (37 C) 98.9 F (37.2 C)   TempSrc: Oral Oral Oral   SpO2: 98% 99% 96%   Weight:   77.8 kg (171 lb 9.6 oz)   Height:       Eyes: PERRL, lids and conjunctivae normal Respiratory: diminished breath sounds at bases   Musculoskeletal: no clubbing /  cyanosis.  Neurologic: CN 2-12 grossly intact. Strength 5/5 in all 4.  Psychiatric: Alert and oriented 3  Data Reviewed: I have personally reviewed following labs and imaging studies  CBC:  Recent Labs Lab 06/14/16 0654 06/15/16 0410 06/16/16 0437 06/17/16 0350 06/18/16 0342  WBC 12.6* 14.3* 12.9* 9.1 9.3  NEUTROABS 10.0* 11.7* 10.0* 6.3 6.4  HGB 11.2* 11.4* 10.8* 10.8* 11.5*  HCT 32.7* 33.8* 32.4* 32.0* 33.5*  MCV 94.5 94.2 95.6 94.7 94.1  PLT 335 369 349 363 [id number]   Basic Metabolic Panel:  Recent Labs Lab 06/12/16 0257 06/13/16 0352 06/16/16 0437 06/17/16 0350 06/18/16 0342  NA 126* 130* 133* 134* 132*  K 2.9* 4.1 3.8 3.8 3.8  CL 88* 93* 98* 98* 97*  CO2 29 29 26 27 24   GLUCOSE 115* 109* 106* 99 108*  BUN 11 11 7 10 8   CREATININE 0.65 0.69  0.67 0.73 0.70  CALCIUM 8.6* 8.8* 8.9 9.0 8.9    Radiology Studies: Dg Orthopantogram  Result Date: 06/16/2016 CLINICAL DATA:  Aortic stenosis. EXAM: ORTHOPANTOGRAM/PANORAMIC COMPARISON:  CT 01/03/2009. FINDINGS: Mild periapical lucency lucency is noted over the left lower most posterior remaining tooth. Subtle lucencies noted over the posterior most aspect of the left inferior mandibular ramus. Dental evaluation suggested. No other focal bony abnormalities identified. No evidence of fracture. IMPRESSION: Mild periapical lucency noted over the left lower most posterior remaining tooth. Subtle lucencies noted over the posterior aspect of the left inferior mandibular ramus. Dental evaluation suggested. Electronically Signed   By: Marcello Moores  Register   On: 06/16/2016 15:20   Ct Cardiac Morph/pulm Vein W/cm&w/o Ca Score  Addendum Date: 06/16/2016   ADDENDUM REPORT: 06/16/2016 09:50 EXAM: OVER-READ INTERPRETATION  CT CHEST The following report is an over-read performed by radiologist Dr. Rebekah Chesterfield [facility], [place] on 06/16/2016. This over-read does not include interpretation of cardiac or coronary anatomy or pathology. The  cardiac CT interpretation by the cardiologist is attached. COMPARISON:  None. FINDINGS: Full description of extracardiac findings on separate dictation for contemporaneously obtained CTA of the chest, abdomen and pelvis 06/15/2016. IMPRESSION: See separate dictation for contemporaneously obtained CTA of the chest, abdomen and pelvis 06/15/2016 for full description of extracardiac findings. Electronically Signed   By: Vinnie Langton M.D.   On: 06/16/2016 09:50   Result Date: 06/16/2016 CLINICAL DATA:  83 year old male with severe aortic stenosis. EXAM: Cardiac TAVR CT TECHNIQUE: The patient was scanned on a Philips 256 scanner. A 120 kV retrospective scan was triggered in the descending thoracic aorta at 111 HU's. Gantry rotation speed was 270 msecs and collimation was .9 mm. 20 mg of iv Metoprolol and no nitro were given. The 3D data set was reconstructed in 5% intervals of the R-R cycle. Systolic and diastolic phases were analyzed on a dedicated work station using MPR, MIP and VRT modes. The patient received 80 cc of contrast. FINDINGS: Aortic Valve: Trileaflet, severely calcified and thickened with severely restricted leaflet opening and minimal calcifications extending into the LVOT. Aorta:  Normal size, no dissection.  Mild diffuse calcifications. Sinotubular Junction:  30 x 30 mm Ascending Thoracic Aorta:  33 x 32 mm Aortic Arch:  24 x 24 mm Descending Thoracic Aorta:  24 x 24 mm Sinus of Valsalva Measurements: Non-coronary:  37 mm Right -coronary:  33 mm Left -coronary:  34 mm Coronary Artery Height above Annulus: Left Main:  16 mm Right Coronary:  24 mm Virtual Basal Annulus Measurements: Maximum/Minimum Diameter:  32 x 22 mm Perimeter:  94 mm Area:  596 mm2 Optimum Fluoroscopic Angle for Delivery:  RAO 2 CRA 1 Dilated pulmonary artery measuring 32 x 29 mm No thrombus in the left atrial appendage. Normal pulmonary vein drainage into the left atrium. No ASD/VSD seen. IMPRESSION: 1. Trileaflet, severely  calcified and thickened with severely restricted leaflet opening and minimal calcifications extending into the LVOT. Annular measurements suitable for delivery of a 29 mm Edwards-SAPIEN 3 TAVR valve. 2. Optimum Fluoroscopic Angle for Delivery:  RAO 2 CRA 1 Ena Dawley Electronically Signed: By: Ena Dawley On: 06/15/2016 18:06   Ct Angio Chest/abd/pel For Dissection W And/or W/wo  Result Date: 06/16/2016 CLINICAL DATA:  83 year old male with history of severe aortic stenosis. Preprocedural study prior to potential transcatheter aortic valve replacement (TAVR). EXAM: CT ANGIOGRAPHY CHEST, ABDOMEN AND PELVIS TECHNIQUE: Multidetector CT imaging through the chest, abdomen and pelvis was performed using the standard  protocol during bolus administration of intravenous contrast. Multiplanar reconstructed images and MIPs were obtained and reviewed to evaluate the vascular anatomy. CONTRAST:  80 mL of Isovue 370. COMPARISON:  No priors. FINDINGS: CTA CHEST FINDINGS Cardiovascular: Heart size is mildly enlarged with biatrial dilatation. There is no significant pericardial fluid, thickening or pericardial calcification. There is aortic atherosclerosis, as well as atherosclerosis of the great vessels of the mediastinum and the coronary arteries, including calcified atherosclerotic plaque in the left main, left anterior descending and left circumflex coronary arteries. Status post median sternotomy for CABG, including LIMA to the LAD. Mediastinum/Lymph Nodes: Multiple prominent borderline enlarged mediastinal and hilar lymph nodes are noted, likely reactive. Esophagus is unremarkable in appearance. No axillary lymphadenopathy. Lungs/Pleura: Moderate right and small left pleural effusions lie dependently. Throughout the lungs bilaterally there are relatively symmetric areas of ground-glass attenuation and septal thickening which are most evident throughout the mid to upper lungs. Lower lungs appear relatively normal,  with a few patchy areas of ground-glass attenuation. Musculoskeletal/Soft Tissues: Median sternotomy wires. There are no aggressive appearing lytic or blastic lesions noted in the visualized portions of the skeleton. CTA ABDOMEN AND PELVIS FINDINGS Hepatobiliary: Liver has a slightly shrunken appearance and nodular contour, suggestive of underlying cirrhosis. No cystic or solid hepatic lesions. No intra or extrahepatic biliary ductal dilatation. Gallbladder is normal in appearance. Pancreas: No pancreatic mass. No pancreatic ductal dilatation. No pancreatic or peripancreatic fluid or inflammatory changes. Spleen: Unremarkable. Adrenals/Urinary Tract: Bilateral kidneys and bilateral adrenal glands are unremarkable in appearance. No hydroureteronephrosis. Small filling defect in the urinary bladder measuring 0.6 x 1.3 x 1.6 cm (axial image 254 of series 401 and coronal image 108 of series 403), with potential stalk like attachment to the wall of the urinary bladder (coronal image 108 of series 403). There is also some plaque-like irregularity along the posterior aspect of the urinary bladder wall, best appreciated on sagittal image 124 of series 404. Stomach/Bowel: The appearance of the stomach is normal. There is no pathologic dilatation of small bowel or colon. Vascular/Lymphatic: Aortic atherosclerosis, with vascular findings and measurements pertinent to potential TAVR procedure, as detailed below. 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 Throughout the abdominal aorta there multiple ulcerated plaques. The largest of these plaques is best demonstrated in cross-section on axial image 200 of series 401 adjacent to the inferior mesenteric artery origin, where it appears as a short segment dissection (however, this does not appear to be a true dissection). 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 The celiac axis, superior mesenteric artery and inferior mesenteric artery are all patent without definite hemodynamically significant stenosis. Two right-sided and one left-sided renal arteries are all patent without hemodynamically significant stenosis. No lymphadenopathy noted in the abdomen or pelvis. Reproductive: Prostate gland and seminal vesicles are unremarkable in appearance. Other: No significant volume of ascites.  No pneumoperitoneum. Musculoskeletal: There are no aggressive appearing lytic or blastic lesions noted in the visualized portions of the skeleton. VASCULAR MEASUREMENTS PERTINENT TO TAVR: AORTA: Minimal Aortic Diameter -  19 x 15 mm Severity of Aortic Calcification -  moderate to severe RIGHT PELVIS: Right Common Iliac Artery - Minimal Diameter - 11.2 x 8.3 mm Tortuosity - severe Calcification - moderate Right External Iliac Artery - Minimal Diameter -  8.7 x 9.1 mm Tortuosity - severe Calcification - mild Right Common Femoral Artery - Focal aneurysmal dilatation up to 17 mm with large ulcerated plaque. Minimal Diameter - 8.5 x 6.3 mm Tortuosity - mild Calcification - moderate LEFT PELVIS: Left Common Iliac Artery - Minimal Diameter - 8.1 x 7.8 mm Tortuosity - severe Calcification - moderate Left External Iliac Artery - Minimal Diameter - 9.1 x 8.5 mm Tortuosity - severe Calcification - mild Left Common Femoral Artery - Minimal Diameter - 8.7 x 7.6 mm Tortuosity - mild Calcification - moderate Review of the MIP images confirms the above findings. IMPRESSION: 1. Vascular findings and measurements pertinent to potential TAVR procedure, as detailed above. From a size criteria, the patient has suitable pelvic arterial access bilaterally. 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 In addition, there is a large ulcerated plaque in the infrarenal abdominal aorta (adjacent to the origin of the inferior mesenteric artery), best appreciated on axial image 200 of series 401, which in the axial plane has an appearance of a very short segment dissection. Caution should be taken when traversing this region with a catheter. 2. Severe thickening calcification of the aortic valve, compatible with the reported clinical history of severe aortic stenosis. 3. Extensive ground-glass attenuation and septal thickening throughout the mid to upper lungs bilaterally, strongly favored to be related to an acute infection. There may also be a background of very mild interstitial pulmonary edema, best appreciated in the lower lungs. 4. Moderate right and small left pleural effusions lying dependently. 5. Plaque-like irregularity along the posterior aspect of the urinary bladder wall, adjacent to a small filling defect which may have an attachment to the wall of the urinary bladder. These findings are concerning for potential urothelial neoplasm, and follow-up nonemergent evaluation by Urology is suggested in the near future to better evaluate these findings. 6. Morphologic changes in the liver suggestive of early cirrhosis, as above. 7. Additional incidental findings, as above. Electronically Signed   By: Vinnie Langton M.D.   On: 06/16/2016 09:01    Scheduled Meds: . aspirin EC  81 mg Oral Daily  . atenolol  75 mg Oral Daily  . digoxin  0.25 mg Intravenous Q6H  . diltiazem  240 mg Oral Daily  . docusate sodium  100 mg Oral BID  . rosuvastatin  10 mg Oral Daily  . sodium chloride flush  3 mL Intravenous Q12H  . sodium chloride flush  3 mL Intravenous Q12H   Continuous Infusions: . heparin 1,500 Units/hr (06/17/16 0552)   Faye Ramsay, MD  [facility] Pager [phone]  If  7PM-7AM, please contact night-coverage [email or web address] Password [email or web address] 06/18/2016, 2:29 PM

## 2016-06-18 NOTE — Progress Notes (Signed)
ANTICOAGULATION CONSULT NOTE - Follow Up Consult  Pharmacy Consult for Heparin (Pradaxa on hold) Indication: atrial fibrillation  Assessment: 48 yoM on heparin for afib while Pradaxa on hold, in anticipation of TAVR. Heparin level is 0.43 (therapeutic) on 1500 units/hr. Hemoglobin is low/stable, platelets wnl. No overt bleeding noted.   Goal of Therapy:  Heparin level 0.3-0.7 units/ml Monitor platelets by anticoagulation protocol: Yes   Plan:  -Continue heparin at 1500 units/hr -Monitor heparin level daily, CBC, s/sx bleeding   No Known Allergies  Patient Measurements: Height: 5\' 9"  (175.3 cm) Weight: 171 lb 9.6 oz (77.8 kg) IBW/kg (Calculated) : 70.7  Vital Signs: Temp: 98.9 F (37.2 C) (03/04 0500) Temp Source: Oral (03/04 0500) BP: 117/74 (03/04 0500) Pulse Rate: 91 (03/04 0500)  Labs:  Recent Labs  06/16/16 0437 06/17/16 0350 06/17/16 1346 06/18/16 0342  HGB 10.8* 10.8*  --  11.5*  HCT 32.4* 32.0*  --  33.5*  PLT 349 363  --  367  HEPARINUNFRC  --  <0.10* 0.38 0.43  CREATININE 0.67 0.73  --  0.70    Estimated Creatinine Clearance: 68.7 mL/min (by C-G formula based on SCr of 0.7 mg/dL).  Belia Heman, PharmD PGY1 Pharmacy Resident [phone] (Pager) 06/18/2016 8:56 AM

## 2016-06-18 NOTE — Progress Notes (Signed)
Subjective:  No complaints of shortness of breath today, no chest pain.  His resting pulse rate remains high with atrial fibrillation.  Objective:  Vital Signs in the last 24 hours: BP 102/68   Pulse (!) 117   Temp 98.9 F (37.2 C) (Oral)   Resp 18   Ht 5\' 9"  (1.753 m)   Wt 77.8 kg (171 lb 9.6 oz)   SpO2 96%   BMI 25.34 kg/m   Physical Exam: Elderly pleasant male in no acute distress Lungs:  Clear Cardiac:  Irregular rhythm, normal S1 and S2, harsh 2 to 3/6 systolic murmur at aortic area with diminished S2 Abdomen:  Soft, nontender, no masses Extremities:  No edema present  Intake/Output from previous day: 03/03 0701 - 03/04 0700 In: 671.5 [P.O.:360; I.V.:311.5] Out: 1650 [Urine:1650]  Weight Filed Weights   06/16/16 0500 06/17/16 0600 06/18/16 0500  Weight: 84.6 kg (186 lb 9.6 oz) 77.6 kg (171 lb 1.6 oz) 77.8 kg (171 lb 9.6 oz)    Lab Results: Basic Metabolic Panel:  Recent Labs  06/17/16 0350 06/18/16 0342  NA 134* 132*  K 3.8 3.8  CL 98* 97*  CO2 27 24  GLUCOSE 99 108*  BUN 10 8  CREATININE 0.73 0.70   CBC:  Recent Labs  06/17/16 0350 06/18/16 0342  WBC 9.1 9.3  NEUTROABS 6.3 6.4  HGB 10.8* 11.5*  HCT 32.0* 33.5*  MCV 94.7 94.1  PLT 363 367   Telemetry: Personally reviewed.  Atrial fibrillation with controlled ventricular response  Assessment/Plan:  1.  Critical aortic stenosis symptomatic 2.  Persistent atrial fibrillation-Atrial fibrillation rate is high at rest. 3.  Chronic diastolic heart failure due to aortic stenosis age and atrial fibrillation 4.  CAD with previous bypass grafting  Recommendations:  Has been switched to heparin to allow for dental work.  Dental work to be done tomorrow and have her when dental work is completed.  I meant to put him on some digoxin to help with rate control as his resting atrial fibrillation rate remains 115 to 120.     W. Doristine Church  MD [facility]  06/18/2016, 9:57 AM

## 2016-06-19 ENCOUNTER — Encounter (HOSPITAL_COMMUNITY): Payer: Medicare HMO

## 2016-06-19 ENCOUNTER — Inpatient Hospital Stay (HOSPITAL_COMMUNITY): Payer: Medicare HMO

## 2016-06-19 ENCOUNTER — Encounter (HOSPITAL_COMMUNITY): Payer: Self-pay | Admitting: Dentistry

## 2016-06-19 DIAGNOSIS — Z01818 Encounter for other preprocedural examination: Secondary | ICD-10-CM

## 2016-06-19 DIAGNOSIS — I350 Nonrheumatic aortic (valve) stenosis: Secondary | ICD-10-CM

## 2016-06-19 LAB — CBC WITH DIFFERENTIAL/PLATELET
Basophils Absolute: 0 10*3/uL (ref 0.0–0.1)
Basophils Relative: 0 %
EOS PCT: 4 %
Eosinophils Absolute: 0.3 10*3/uL (ref 0.0–0.7)
HEMATOCRIT: 33.3 % — AB (ref 39.0–52.0)
HEMOGLOBIN: 11 g/dL — AB (ref 13.0–17.0)
LYMPHS ABS: 1.4 10*3/uL (ref 0.7–4.0)
Lymphocytes Relative: 17 %
MCH: 31.3 pg (ref 26.0–34.0)
MCHC: 33 g/dL (ref 30.0–36.0)
MCV: 94.6 fL (ref 78.0–100.0)
Monocytes Absolute: 0.7 10*3/uL (ref 0.1–1.0)
Monocytes Relative: 8 %
Neutro Abs: 6 10*3/uL (ref 1.7–7.7)
Neutrophils Relative %: 71 %
Platelets: 357 10*3/uL (ref 150–400)
RBC: 3.52 MIL/uL — AB (ref 4.22–5.81)
RDW: 12.5 % (ref 11.5–15.5)
WBC: 8.4 10*3/uL (ref 4.0–10.5)

## 2016-06-19 LAB — C-REACTIVE PROTEIN: CRP: 3.9 mg/dL — AB (ref <1.0)

## 2016-06-19 LAB — BASIC METABOLIC PANEL
ANION GAP: 5 (ref 5–15)
BUN: 5 mg/dL — ABNORMAL LOW (ref 6–20)
CHLORIDE: 101 mmol/L (ref 101–111)
CO2: 29 mmol/L (ref 22–32)
Calcium: 9 mg/dL (ref 8.9–10.3)
Creatinine, Ser: 0.71 mg/dL (ref 0.61–1.24)
GFR calc non Af Amer: >60 mL/min (ref >60)
GLUCOSE: 104 mg/dL — AB (ref 65–99)
Potassium: 4 mmol/L (ref 3.5–5.1)
Sodium: 135 mmol/L (ref 135–145)

## 2016-06-19 LAB — HEPARIN LEVEL (UNFRACTIONATED): Heparin Unfractionated: 0.43 IU/mL (ref 0.30–0.70)

## 2016-06-19 LAB — SEDIMENTATION RATE: Sed Rate: 80 mm/hr — ABNORMAL HIGH (ref 0–16)

## 2016-06-19 MED ORDER — MAGNESIUM SULFATE 50 % IJ SOLN
40.0000 meq | INTRAMUSCULAR | Status: DC
  Filled 2016-06-19: qty 10

## 2016-06-19 MED ORDER — DEXMEDETOMIDINE HCL IN NACL 400 MCG/100ML IV SOLN
0.1000 ug/kg/h | INTRAVENOUS | Status: DC
  Filled 2016-06-19: qty 100

## 2016-06-19 MED ORDER — FUROSEMIDE 10 MG/ML IJ SOLN
40.0000 mg | Freq: Once | INTRAMUSCULAR | Status: AC
  Administered 2016-06-19: 40 mg via INTRAVENOUS
  Filled 2016-06-19: qty 4

## 2016-06-19 MED ORDER — POTASSIUM CHLORIDE CRYS ER 20 MEQ PO TBCR
20.0000 meq | EXTENDED_RELEASE_TABLET | Freq: Once | ORAL | Status: AC
  Administered 2016-06-19: 20 meq via ORAL
  Filled 2016-06-19: qty 1

## 2016-06-19 MED ORDER — NOREPINEPHRINE BITARTRATE 1 MG/ML IV SOLN
0.0000 ug/min | INTRAVENOUS | Status: DC
  Filled 2016-06-19: qty 4

## 2016-06-19 MED ORDER — HEPARIN (PORCINE) IN NACL 100-0.45 UNIT/ML-% IJ SOLN
1500.0000 [IU]/h | INTRAMUSCULAR | Status: DC
  Filled 2016-06-19: qty 250

## 2016-06-19 MED ORDER — NITROGLYCERIN IN D5W 200-5 MCG/ML-% IV SOLN
2.0000 ug/min | INTRAVENOUS | Status: DC
  Filled 2016-06-19: qty 250

## 2016-06-19 MED ORDER — SODIUM CHLORIDE 0.9 % IV SOLN
INTRAVENOUS | Status: DC
  Filled 2016-06-19: qty 2.5

## 2016-06-19 MED ORDER — POTASSIUM CHLORIDE 2 MEQ/ML IV SOLN
80.0000 meq | INTRAVENOUS | Status: DC
  Filled 2016-06-19: qty 40

## 2016-06-19 MED ORDER — CEFAZOLIN SODIUM-DEXTROSE 2-4 GM/100ML-% IV SOLN
2.0000 g | Freq: Once | INTRAVENOUS | Status: AC
  Administered 2016-06-20: 2 g via INTRAVENOUS
  Filled 2016-06-19: qty 100

## 2016-06-19 MED ORDER — CEFUROXIME SODIUM 1.5 G IJ SOLR
1.5000 g | INTRAMUSCULAR | Status: DC
  Filled 2016-06-19: qty 1.5

## 2016-06-19 MED ORDER — SODIUM CHLORIDE 0.9 % IV SOLN
INTRAVENOUS | Status: DC
  Filled 2016-06-19: qty 30

## 2016-06-19 MED ORDER — DOPAMINE-DEXTROSE 3.2-5 MG/ML-% IV SOLN
0.0000 ug/kg/min | INTRAVENOUS | Status: DC
  Filled 2016-06-19: qty 250

## 2016-06-19 MED ORDER — PHENYLEPHRINE HCL 10 MG/ML IJ SOLN
30.0000 ug/min | INTRAMUSCULAR | Status: DC
  Filled 2016-06-19: qty 2

## 2016-06-19 MED ORDER — EPINEPHRINE PF 1 MG/ML IJ SOLN
0.0000 ug/min | INTRAVENOUS | Status: DC
  Filled 2016-06-19: qty 4

## 2016-06-19 MED ORDER — VANCOMYCIN HCL 10 G IV SOLR
1250.0000 mg | INTRAVENOUS | Status: DC
  Filled 2016-06-19: qty 1250

## 2016-06-19 NOTE — Progress Notes (Signed)
Progress Note  Patient Name: Richard Simon Date of Encounter: 06/19/2016  Primary Cardiologist: Dr. Ellyn Hack  Subjective   Still with some shortness of breath, but no chest pain or other complaints.  Inpatient Medications    Scheduled Meds: . aspirin EC  81 mg Oral Daily  . atenolol  75 mg Oral Daily  . [START ON 06/20/2016] cefUROXime (ZINACEF)  IV  1.5 g Intravenous To OR  . [START ON 06/20/2016] dexmedetomidine  0.1-0.7 mcg/kg/hr Intravenous To OR  . diltiazem  240 mg Oral Daily  . docusate sodium  100 mg Oral BID  . [START ON 06/20/2016] DOPamine  0-10 mcg/kg/min Intravenous To OR  . [START ON 06/20/2016] epinephrine  0-10 mcg/min Intravenous To OR  . [START ON 06/20/2016] heparin 30,000 units/NS 1000 mL solution for CELLSAVER   Other To OR  . [START ON 06/20/2016] insulin (NOVOLIN-R) infusion   Intravenous To OR  . [START ON 06/20/2016] magnesium sulfate  40 mEq Other To OR  . [START ON 06/20/2016] nitroGLYCERIN  2-200 mcg/min Intravenous To OR  . [START ON 06/20/2016] norepinephrine (LEVOPHED) Adult infusion  0-10 mcg/min Intravenous To OR  . [START ON 06/20/2016] phenylephrine 20mg /261mL NS (0.08mg /ml) infusion  30-200 mcg/min Intravenous To OR  . [START ON 06/20/2016] potassium chloride  80 mEq Other To OR  . rosuvastatin  10 mg Oral Daily  . sodium chloride flush  3 mL Intravenous Q12H  . sodium chloride flush  3 mL Intravenous Q12H  . [START ON 06/20/2016] vancomycin  1,250 mg Intravenous To OR   Continuous Infusions: . heparin 1,500 Units/hr (06/19/16 0213)   PRN Meds: sodium chloride, acetaminophen, ALPRAZolam, guaiFENesin-dextromethorphan, HYDROcodone-acetaminophen, levalbuterol, magnesium hydroxide, ondansetron **OR** ondansetron (ZOFRAN) IV, sodium chloride flush, witch hazel-glycerin   Vital Signs    Vitals:   06/18/16 2320 06/19/16 0120 06/19/16 0500 06/19/16 0944  BP: 111/85 104/83 117/73 116/65  Pulse: (!) 42 94 85 88  Resp: (!) 24 (!) 25 20   Temp:   98.9 F (37.2 C)     TempSrc:   Oral   SpO2: 96% 100% 98%   Weight:   171 lb 11.2 oz (77.9 kg)   Height:        Intake/Output Summary (Last 24 hours) at 06/19/16 1302 Last data filed at 06/19/16 [id number]  Gross per 24 hour  Intake           530.75 ml  Output             2100 ml  Net         -1569.25 ml   Filed Weights   06/17/16 0600 06/18/16 0500 06/19/16 0500  Weight: 171 lb 1.6 oz (77.6 kg) 171 lb 9.6 oz (77.8 kg) 171 lb 11.2 oz (77.9 kg)    Telemetry    Atrial fibrillation with controlled ventricular rate - Personally Reviewed   Physical Exam  Pleasant elderly male, sitting up in chair GEN: No acute distress.   Neck: No JVD Cardiac:  irregular with grade 3/6 late peaking harsh systolic murmur at the right upper sternal border Respiratory: Clear to auscultation bilaterally. GI: Soft, nontender, non-distended  MS: No edema; No deformity. Neuro:  Nonfocal  Psych: Normal affect   Labs    Chemistry Recent Labs Lab 06/17/16 0350 06/18/16 0342 06/19/16 0409  NA 134* 132* 135  K 3.8 3.8 4.0  CL 98* 97* 101  CO2 27 24 29   GLUCOSE 99 108* 104*  BUN 10 8 5*  CREATININE 0.73 0.70  0.71  CALCIUM 9.0 8.9 9.0  GFRNONAA >60 >60 >60  GFRAA >60 >60 >60  ANIONGAP 9 11 5      Hematology Recent Labs Lab 06/17/16 0350 06/18/16 0342 06/19/16 0409  WBC 9.1 9.3 8.4  RBC 3.38* 3.56* 3.52*  HGB 10.8* 11.5* 11.0*  HCT 32.0* 33.5* 33.3*  MCV 94.7 94.1 94.6  MCH 32.0 32.3 31.3  MCHC 33.8 34.3 33.0  RDW 12.5 12.6 12.5  PLT 363 367 357    Cardiac EnzymesNo results for input(s): TROPONINI in the last 168 hours. No results for input(s): TROPIPOC in the last 168 hours.   BNPNo results for input(s): BNP, PROBNP in the last 168 hours.   DDimer No results for input(s): DDIMER in the last 168 hours.   Radiology    No results found.   Patient Profile     83 y.o. male with critical aortic stenosis and heart failure  Assessment & Plan    1. Acute on chronic combined systolic and diastolic  heart failure: The patient showed some signs of clinical improvement. I'm going to repeat his chest x-ray today. Will continue his current medical program.  2. Permanent atrial fibrillation: Heart rate is better controlled. He is on a combination of atenolol and diltiazem. IV heparin used for bridging and will plan to resume oral anticoagulation after TAVR  3. Critical aortic stenosis: The patient has poor dentition and will require dental extraction tomorrow by Dr. Lawana Chambers. We discussed his case today. He would be able to have TAVR within about 48 hours of dental extraction. Will discuss his case further with Dr. Cyndia Bent and anticipate moving forward with TAVR at the end of this week.  Deatra James, MD  06/19/2016, 1:02 PM

## 2016-06-19 NOTE — Progress Notes (Signed)
I attempted to reach patient's son a second time to obtain consent for oral procedure tomorrow. He did not answer the phone call. Ferdinand Lango, RN

## 2016-06-19 NOTE — Progress Notes (Signed)
Physical Therapy Treatment Patient Details Name: Richard Simon MRN: [id number] DOB: May 16, 1929 Today's Date: 06/19/2016    History of Present Illness Pt is a 83 y.o. male with medical history significant of atrial fibrillation on Pradaxa, CAD, CABG, hyperlipidemia, hypertension, moderate to severe aortic stenosis is coming to the emergency department with complaints of on/off chest pressure, associated with dyspnea, orthopnea and fatigue. He was diagnosed with hyponatremia    PT Comments    Patient not feeling well today reporting he is "feeling drunk and hung over." Agreeable to doing short distance ambulation within room. Required Min A for balance and had 1 LOB during gait training requiring assist to prevent fall. Encouraged up in chair daily and increased activity when feeling better. Concerns about safety awareness. Able to maintain Sp02 on RA >92%. Will continue to follow.    Follow Up Recommendations  SNF;Supervision/Assistance - 24 hour     Equipment Recommendations  Rolling walker with 5" wheels    Recommendations for Other Services       Precautions / Restrictions Precautions Precautions: Fall Restrictions Weight Bearing Restrictions: No    Mobility  Bed Mobility Overal bed mobility: Needs Assistance Bed Mobility: Supine to Sit     Supine to sit: Min guard     General bed mobility comments: No assist needed to get to EOB. Increased time.   Transfers Overall transfer level: Needs assistance Equipment used: None Transfers: Sit to/from Stand Sit to Stand: Min guard         General transfer comment: Min guard for safety and pt reaching for counter for support.   Ambulation/Gait Ambulation/Gait assistance: Min assist Ambulation Distance (Feet): 15 Feet Assistive device: None Gait Pattern/deviations: Step-through pattern;Decreased stride length;Staggering right;Staggering left Gait velocity: decreased   General Gait Details: Slow, unsteady gait with pt  staggering to right/left and reaching for IV pole for support. VSS. Sp02 >92% on RA. Uncontrolled descent into chair with 1 LOB requiring assist to correct.    Stairs            Wheelchair Mobility    Modified Rankin (Stroke Patients Only)       Balance Overall balance assessment: Needs assistance Sitting-balance support: Feet supported;No upper extremity supported Sitting balance-Leahy Scale: Fair     Standing balance support: During functional activity Standing balance-Leahy Scale: Poor Standing balance comment: Requires UE support in standinfg to maintain balance. 1 LOB requiring assist to correct during dynamic standing.                    Cognition Arousal/Alertness: Awake/alert Behavior During Therapy: WFL for tasks assessed/performed Overall Cognitive Status: No family/caregiver present to determine baseline cognitive functioning Area of Impairment: Safety/judgement;Memory;Problem solving     Memory: Decreased short-term memory   Safety/Judgement: Decreased awareness of safety   Problem Solving: Slow processing;Decreased initiation;Requires verbal cues;Requires tactile cues General Comments: Pt was appropriate, but then at times would say random things that made no sense and not related to current conversation.  decreased memory    Exercises      General Comments General comments (skin integrity, edema, etc.): Sp02 remained >92% on RA.      Pertinent Vitals/Pain Pain Assessment: No/denies pain    Home Living                      Prior Function            PT Goals (current goals can now be found in the care plan section)  Progress towards PT goals: Progressing toward goals    Frequency    Min 3X/week      PT Plan Current plan remains appropriate    Co-evaluation             End of Session Equipment Utilized During Treatment: Gait belt;Oxygen Activity Tolerance: Patient limited by fatigue Patient left: in chair;with  call bell/phone within reach;with chair alarm set Nurse Communication: Mobility status PT Visit Diagnosis: Muscle weakness (generalized) (M62.81);Unsteadiness on feet (R26.81);Difficulty in walking, not elsewhere classified (R26.2)     Time: [id number] PT Time Calculation (min) (ACUTE ONLY): 20 min  Charges:  $Therapeutic Activity: 8-22 mins                    G Codes:       Deanna Boehlke A Anner Baity 06/19/2016, 11:12 AM Wray Kearns, PT, DPT [phone]

## 2016-06-19 NOTE — Progress Notes (Signed)
ANTICOAGULATION CONSULT NOTE - Follow Up Consult  Pharmacy Consult for Heparin (Pradaxa on hold) Indication: atrial fibrillation  Assessment: 77 yoM on heparin for afib while Pradaxa on hold, in anticipation of TAVR.  Heparin level continues to be therapeutic on 1500 units/hr. Hemoglobin is low/stable, platelets wnl. No overt bleeding noted.   Goal of Therapy:  Heparin level 0.3-0.7 units/ml Monitor platelets by anticoagulation protocol: Yes   Plan:  -Continue heparin at 1500 units/hr -Monitor heparin level daily, CBC, s/sx bleeding   No Known Allergies  Patient Measurements: Height: 5\' 9"  (175.3 cm) Weight: 171 lb 11.2 oz (77.9 kg) IBW/kg (Calculated) : 70.7  Vital Signs: Temp: 98.9 F (37.2 C) (03/05 0500) Temp Source: Oral (03/05 0500) BP: 116/65 (03/05 0944) Pulse Rate: 88 (03/05 0944)  Labs:  Recent Labs  06/17/16 0350 06/17/16 1346 06/18/16 0342 06/19/16 0409  HGB 10.8*  --  11.5* 11.0*  HCT 32.0*  --  33.5* 33.3*  PLT 363  --  367 357  HEPARINUNFRC <0.10* 0.38 0.43 0.43  CREATININE 0.73  --  0.70 0.71    Estimated Creatinine Clearance: 68.7 mL/min (by C-G formula based on SCr of 0.71 mg/dL).  Erin Hearing PharmD., BCPS Clinical Pharmacist Pager [phone] 06/19/2016 10:46 AM

## 2016-06-19 NOTE — Consult Note (Signed)
Name: Richard Simon MRN: [id number] DOB: 01/10/1930    ADMISSION DATE:  06/07/2016 CONSULTATION DATE:  06/19/16  REFERRING MD :  Doyle Askew  CHIEF COMPLAINT:  Pre-op clearance for TAVR   HISTORY OF PRESENT ILLNESS:  Richard Simon is a 83 y.o. male with a PMH as outlined below.  He was admitted 06/07/16 with chest pressure and SOB. Was evaluated by cards and had echo 02/22 that demonstrated EF 45-50% and severely known calcified aortic valve (known AS).  Then had cardiac cath 02/27 that confirmed severe / critical AS.  As part of workup for TAVR, had CT chest/abd/pelvis 03/01 that demonstrated extensive ground glass attenuation and septal thickening throughout the mid to upper lungs bilaterally. PFT's from 03/02 demonstrated FVC 1.64 (42% pred), FEV1 1.55 (58% pred), ratio 95 (133% pred).  Given CT findings in chest, PCCM asked to see 03/05 as part of pre-op clearance.  Of note, he completed 7 day course of azithro/ceftriaxone for presumed CAP on 06/13/16.   PULMONARY HISTORY: Cigarette Use: Former smoker, quit 41 yrs ago. Other Inhaled Substances: Denies. 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 Pets: None. Aspiration: Denies.   PAST MEDICAL HISTORY :   has a past medical history of Atrial fibrillation, permanent ([place]); CAD (coronary artery disease) (2004); Dyslipidemia, goal LDL below 70; Hypertension; Moderate to severe aortic stenosis (01/2013; 06/2014); and S/P CABG x 4 (2004).  has a past surgical history that includes Cardiac catheterization (12/11/2002); Coronary artery bypass graft (12/15/2002); Leane Call (December 2012); transthoracic echocardiogram (September 2013); transthoracic echocardiogram (October 2014); transthoracic echocardiogram (March 2016); and Right Heart Cath and Coronary/Graft Angiography (N/A, 06/13/2016). Prior to Admission medications   Medication Sig Start Date End Date  Taking? Authorizing Provider  Acetaminophen 500 MG coapsule Take 1 tablet by mouth every 6 (six) hours as needed for pain.  05/18/14  Yes Historical Provider, MD  aspirin EC 81 MG tablet Take 81 mg by mouth daily.   Yes Historical Provider, MD  atenolol (TENORMIN) 50 MG tablet Take 50 mg by mouth daily. 01/08/13  Yes Historical Provider, MD  CARTIA XT 240 MG 24 hr capsule TAKE 1 CAPSULE EVERY DAY 02/07/16  Yes Leonie Man, MD  co-enzyme Q-10 30 MG capsule Take 1 capsule by mouth daily. 04/28/15  Yes Historical Provider, MD  dabigatran (PRADAXA) 150 MG CAPS capsule Take 1 capsule (150 mg total) by mouth 2 (two) times daily. 05/02/16  Yes Leonie Man, MD  lisinopril (PRINIVIL,ZESTRIL) 10 MG tablet Take 1 tablet by mouth at bedtime. 04/24/15  Yes Historical Provider, MD  Omega-3 Fatty Acids (FISH OIL) 1200 MG CAPS Take 1,200 mg by mouth 2 (two) times daily.   Yes Historical Provider, MD  rosuvastatin (CRESTOR) 10 MG tablet TAKE 1 TABLET (10 MG TOTAL) BY MOUTH DAILY. 03/16/16  Yes Leonie Man, MD   No Known Allergies  FAMILY HISTORY:  family history includes Hip fracture in his mother. SOCIAL HISTORY:  reports that he quit smoking about 41 years ago. He has never used smokeless tobacco. He reports that he drinks about 8.4 oz of alcohol per week .  REVIEW OF SYSTEMS:   All negative; except for those that are bolded, which indicate positives.  Constitutional: weight loss, weight gain, night sweats, fevers, chills, fatigue, weakness.  HEENT: headaches, sore throat, sneezing, nasal congestion, post nasal drip, difficulty swallowing, tooth/dental problems, visual complaints, visual changes, ear aches. Neuro: difficulty with speech,  weakness, numbness, ataxia. CV:  chest pain, orthopnea, PND, swelling in lower extremities, dizziness, palpitations, syncope.  Resp: cough (was discolored before, now clear), hemoptysis, dyspnea, wheezing. GI: heartburn, indigestion, abdominal pain, nausea, vomiting,  diarrhea, constipation, change in bowel habits, loss of appetite, hematemesis, melena, hematochezia.  GU: dysuria, change in color of urine, urgency or frequency, flank pain, hematuria. MSK: joint pain or swelling, decreased range of motion. Psych: change in mood or affect, depression, anxiety, suicidal ideations, homicidal ideations. Skin: rash, itching, bruising.    SUBJECTIVE:  Denies any chest pain, SOB.  Was having cough productive of discolored sputum when first admitted, now clear.  Denies fevers/chills/sweats.  VITAL SIGNS: Temp:  [98.3 F (36.8 C)-98.9 F (37.2 C)] 98.7 F (37.1 C) (03/05 1300) Pulse Rate:  [42-105] 84 (03/05 1300) Resp:  [18-33] 18 (03/05 1300) BP: (104-141)/(65-91) 118/69 (03/05 1300) SpO2:  [96 %-100 %] 99 % (03/05 1300) Weight:  [77.9 kg (171 lb 11.2 oz)] 77.9 kg (171 lb 11.2 oz) (03/05 0500)  PHYSICAL EXAMINATION: General: Elderly chronically ill appearing male, resting in recliner, in NAD. Neuro: A&O x 3, non-focal.  HEENT: [place]/AT. PERRL, sclerae anicteric. Cardiovascular: IRIR, 3/6 SEM most noteable at RUSB. Lungs: Respirations even and unlabored.  Faint basilar crackles bilaterally, No W/R/R.  Abdomen: BS x 4, soft, NT/ND.  Musculoskeletal: No gross deformities, no edema.  Skin: Intact, warm, no rashes.   Recent Labs Lab 06/17/16 0350 06/18/16 0342 06/19/16 0409  NA 134* 132* 135  K 3.8 3.8 4.0  CL 98* 97* 101  CO2 '27 24 29  ' BUN 10 8 5*  CREATININE 0.73 0.70 0.71  GLUCOSE 99 108* 104*    Recent Labs Lab 06/17/16 0350 06/18/16 0342 06/19/16 0409  HGB 10.8* 11.5* 11.0*  HCT 32.0* 33.5* 33.3*  WBC 9.1 9.3 8.4  PLT 363 367 357   Dg Chest Port 1 View  Result Date: 06/19/2016 CLINICAL DATA:  Shortness of breath and congestion EXAM: PORTABLE CHEST 1 VIEW COMPARISON:  06/09/2016, 06/15/2016 FINDINGS: Cardiac shadow is mildly enlarged but stable. Postsurgical changes are again seen. Diffuse changes in the upper lobes are noted  bilaterally similar to that seen on recent chest x-ray and recent CT examination. No bony abnormality is noted. IMPRESSION: Stable changes in the upper lobes bilaterally. No new focal abnormality is seen. Electronically Signed   By: Inez Catalina M.D.   On: 06/19/2016 14:07    STUDIES:  Echo 02/22 > EF 45-50%, severely calcified aortic valve, PAP 63. Cardiac cath 02/27 > severe / critical aortic stenosis, severe native coronary disease with 4/4 widely patent grafts. CT chest/abd/pelv 03/01 > extensive ground glass attenuation and septal thickening throughout the mid to upper lungs bilaterally. PFT's 03/02 >  FVC 1.64 (42% pred), FEV1 1.55 (58% pred), ratio 95 (133% pred).  SIGNIFICANT EVENTS  02/21 > admit. 03/05 > PCCM consult.  ASSESSMENT / PLAN:  Discussion: As an 83 year old Male with multiple co-morbidities (though essentially only cardiac - CAD s/p CABG x 4, severe / critical AS, A.fib), Mr. Stegman has at least a slightly increased risk of post operative pulmonary complications compared to the general population of the same age.  He had CT of chest/abdomen/pelvis as part of his workup that showed extensive GGO's with septal thickening suggestive of infectious process.  He also had symptoms including productive cough that supported a diagnosis of CAP for which he has completed 7 day course of abx.  Despite CT being done after abx course completed, we  would not expect resolution of CT findings in such a short time (likely weeks).  Additionally, his CXR today 03/05 does show some pulmonary edema (though I/O's documented as - 6.2L since admit). Alternatively, he could have a component of underlying ILD which has yet to be diagnosed.  If TAVR is deemed the most appropriate course of management, then it can be performed with acceptable risk with close attention to his peri-operative pulmonary and medical care.  In other words, from a pulmonary standpoint, there are no absolute contraindications to  proceed with surgery.    Recs: Will give 16m lasix x1 now. OK to proceed with surgery from our standpoint (see discussion above). OK to extubate in PACU if meets criteria. Admit to ICU or SDU post op for close RN and RT attention. Would place pt on scheduled BD's post operatively for a few days. Careful attention to analgesia post op - avoid over sedation. Incentive spirometry / pulmonary hygiene post op. Will assess ESR, CRP, ANA, ANCA, RF, anti-CCP, scl-70.  PCCM will follow labs above and see again once they result.   RMontey Hora PEmmetPulmonary & Critical Care Medicine Pager: [phone] or [phone]/08/2016, 2:30 PM

## 2016-06-19 NOTE — Consult Note (Signed)
DENTAL CONSULTATION  Date of Consultation:  06/19/2016 Patient Name:   Richard Simon Date of Birth:   02/07/1930 Medical Record Number: [id number]  VITALS: BP 116/65   Pulse 88   Temp 98.9 F (37.2 C) (Oral)   Resp 20   Ht 5\' 9"  (1.753 m)   Wt 171 lb 11.2 oz (77.9 kg)   SpO2 98%   BMI 25.36 kg/m   CHIEF COMPLAINT: The patient was referred by Dr. Cyndia Bent for a dental consultation.  HPI: Richard Simon is an 83 year old male recently diagnosed with severe aortic stenosis. Patient with anticipated aortic valve replacement in the near future. Patient is now seen as part of a medically necessary pre-heart valve surgery dental protocol examination rule out dental infection that may affect the patient's systemic health and anticipated heart valve surgery.  The patient currently denies acute toothaches, swellings, or abscesses. The patient was last seen by a dentist in November of 2017.  The patient was seen by  Dr. Vale Haven for an exam, dental cleaning, and full series of dental radiographs. The patient was treatment planned for extraction tooth numbers 7 and 12 by an oral surgeon and subsequent dental restorations, buildups, and crowns by Dr. Gilford Rile. The patient did not proceed with any dental treatment after the November appointment. The patient denies having any partial dentures. Patient denies having dental phobia.  PROBLEM LIST: Patient Active Problem List   Diagnosis Date Noted  . Chronic diastolic heart failure ([place])   . Acute encephalopathy 06/10/2016  . SOB (shortness of breath)   . Hyponatremia 06/07/2016  . Chest pain 06/07/2016  . Fatigue due to treatment 08/22/2014  . Current use of long term anticoagulation 02/13/2013  . Aortic stenosis, severe   . Essential hypertension   . Dyslipidemia, goal LDL below 70   . Atrial fibrillation, permanent ([place])   . Multivessel Native CAD - s/p CABGx4: LIMA to LAD, SVG-Diag, seqSVG-OM1-OM2 12/11/2002  . CAD (coronary artery disease)  04/17/2002    PMH: Past Medical History:  Diagnosis Date  . Atrial fibrillation, permanent ([place])    Rate controlled. Anticoagulation with Pradaxa.  Marland Kitchen CAD (coronary artery disease) 2004   S/P 4-vessel CABG in 2004  . Dyslipidemia, goal LDL below 70   . Hypertension     very well controlled  . Moderate to severe aortic stenosis 01/2013; 06/2014   Echo Eval: a) 01/2013: Mod calcified AoV w/ reduced mobility of R cusp. Mod-Severe stenosis. Mn-Pk gradient 25 mmHg--39 mmHg. AVA ~ 0.79-0.83 cm. Moderate MR. Normal LV size and function EF 55-60%. Elevated LAP. Mild pulmonary attention. Mild moderately dilated left atrium.;; b) 06/2014: EF 60-65%. Mod-Severe AS, AVA~0.69, Mn-Pk gradient 32 mmHg -- 55 mmHg, severe LA dilation, moderate-severe RA d  . S/P CABG x 4 2004   Post CABG    PSH: Past Surgical History:  Procedure Laterality Date  . CARDIAC CATHETERIZATION  12/11/2002   Recommendation-CABG; 80% mid LAD, 70% distal LAD. Mid circumflex 95%, OM1 95%. AV groove circumflex 95%. Proximal RCA 100%  . CORONARY ARTERY BYPASS GRAFT  12/15/2002   LIMA-LAD, SVG-diagonal, SVG-OM1-OM2 sequential  Carlton Adam MYOVIEW  December 2012   Fixed basal inferior diaphragmatic attenuation versusinfarct, no ischemia  . RIGHT HEART CATH AND CORONARY/GRAFT ANGIOGRAPHY N/A 06/13/2016   Procedure: Right Heart Cath and Coronary/Graft Angiography;  Surgeon: Leonie Man, MD;  Location: [facility];  Service: Cardiovascular;  Laterality: N/A;  . TRANSTHORACIC ECHOCARDIOGRAM  September 2013   Normal EF. > 55%; moderate  aortic valve stenosis with mild AI. Gradient: Peak 40 mmHg, mean 24 [employer]. Moderate LA dilation. Moderate TR with RV pressure 30-40 mmHg. Mild MR.  . TRANSTHORACIC ECHOCARDIOGRAM  October 2014   Moderate-severe AS with mild AI. Peak and mean gradients no significant change: 39 mmHg and 25 mmHg;; mild LV dilation with EF 55-60%. Elevated LAP. Moderate MR. Mild to moderate LA dilation. Mildly  elevated RV pressure.  . TRANSTHORACIC ECHOCARDIOGRAM  March 2016   EF 60-65%. Mod-Severe AS, AVA~0.69, Mn-Pk gradient 32 mmHg -- 55 mmHg, severe LA dilation, moderate-severe RA dilation: Progression of disease    ALLERGIES: No Known Allergies  MEDICATIONS: Current Facility-Administered Medications  Medication Dose Route Frequency Provider Last Rate Last Dose  . 0.9 %  sodium chloride infusion  250 mL Intravenous PRN Leonie Man, MD      . acetaminophen (TYLENOL) tablet 650 mg  650 mg Oral Q6H PRN Reubin Milan, MD   650 mg at 06/19/16 1215  . ALPRAZolam Duanne Moron) tablet 0.25 mg  0.25 mg Oral QHS PRN Rise Patience, MD   0.25 mg at 06/18/16 2027  . aspirin EC tablet 81 mg  81 mg Oral Daily Reubin Milan, MD   81 mg at 06/19/16 0944  . atenolol (TENORMIN) tablet 75 mg  75 mg Oral Daily Cheryln Manly, NP   75 mg at 06/19/16 0944  . [START ON 06/20/2016] cefUROXime (ZINACEF) 1.5 g in dextrose 5 % 50 mL IVPB  1.5 g Intravenous To OR Theodis Blaze, MD      . Derrill Memo ON 06/20/2016] dexmedetomidine (PRECEDEX) 400 MCG/100ML (4 mcg/mL) infusion  0.1-0.7 mcg/kg/hr Intravenous To OR Theodis Blaze, MD      . diltiazem (CARDIZEM CD) 24 hr capsule 240 mg  240 mg Oral Daily Reubin Milan, MD   240 mg at 06/19/16 0944  . docusate sodium (COLACE) capsule 100 mg  100 mg Oral BID Rhetta Mura Schorr, NP   100 mg at 06/19/16 0944  . [START ON 06/20/2016] DOPamine (INTROPIN) 800 mg in dextrose 5 % 250 mL (3.2 mg/mL) infusion  0-10 mcg/kg/min Intravenous To OR Theodis Blaze, MD      . Derrill Memo ON 06/20/2016] EPINEPHrine (ADRENALIN) 4 mg in dextrose 5 % 250 mL (0.016 mg/mL) infusion  0-10 mcg/min Intravenous To OR Theodis Blaze, MD      . guaiFENesin-dextromethorphan (ROBITUSSIN DM) 100-10 MG/5ML syrup 5 mL  5 mL Oral Q4H PRN Caren Griffins, MD   5 mL at 06/15/16 2142  . [START ON 06/20/2016] heparin 30,000 units/NS 1000 mL solution for CELLSAVER   Other To OR Theodis Blaze, MD      . heparin ADULT  infusion 100 units/mL (25000 units/254mL sodium chloride 0.45%)  1,500 Units/hr Intravenous Continuous Erenest Blank, RPH 15 mL/hr at 06/19/16 0213 1,500 Units/hr at 06/19/16 [id number]  . HYDROcodone-acetaminophen (NORCO/VICODIN) 5-325 MG per tablet 1-2 tablet  1-2 tablet Oral Q4H PRN Jeryl Columbia, NP   2 tablet at 06/15/16 0119  . [START ON 06/20/2016] insulin regular (NOVOLIN R,HUMULIN R) 250 Units in sodium chloride 0.9 % 250 mL (1 Units/mL) infusion   Intravenous To OR Theodis Blaze, MD      . levalbuterol Penne Lash) nebulizer solution 0.63 mg  0.63 mg Nebulization Q6H PRN Caren Griffins, MD   0.63 mg at 06/11/16 2223  . magnesium hydroxide (MILK OF MAGNESIA) suspension 30 mL  30 mL Oral Daily PRN Costin Karlyne Greenspan, MD  30 mL at 06/14/16 1411  . [START ON 06/20/2016] magnesium sulfate (IV Push/IM) injection 40 mEq  40 mEq Other To OR Theodis Blaze, MD      . Derrill Memo ON 06/20/2016] nitroGLYCERIN 50 mg in dextrose 5 % 250 mL (0.2 mg/mL) infusion  2-200 mcg/min Intravenous To OR Theodis Blaze, MD      . Derrill Memo ON 06/20/2016] norepinephrine (LEVOPHED) 4 mg in dextrose 5 % 250 mL (0.016 mg/mL) infusion  0-10 mcg/min Intravenous To OR Theodis Blaze, MD      . ondansetron [facility]) tablet 4 mg  4 mg Oral Q6H PRN Reubin Milan, MD       Or  . ondansetron [facility]) injection 4 mg  4 mg Intravenous Q6H PRN Reubin Milan, MD   4 mg at 06/10/16 1043  . [START ON 06/20/2016] phenylephrine (NEO-SYNEPHRINE) 20 mg in sodium chloride 0.9 % 250 mL (0.08 mg/mL) infusion  30-200 mcg/min Intravenous To OR Theodis Blaze, MD      . Derrill Memo ON 06/20/2016] potassium chloride injection 80 mEq  80 mEq Other To OR Theodis Blaze, MD      . rosuvastatin (CRESTOR) tablet 10 mg  10 mg Oral Daily Reubin Milan, MD   10 mg at 06/19/16 0944  . sodium chloride flush (NS) 0.9 % injection 3 mL  3 mL Intravenous Q12H Reubin Milan, MD   3 mL at 06/19/16 1000  . sodium chloride flush (NS) 0.9 % injection 3 mL  3 mL Intravenous  Q12H Leonie Man, MD   3 mL at 06/19/16 1000  . sodium chloride flush (NS) 0.9 % injection 3 mL  3 mL Intravenous PRN Leonie Man, MD   3 mL at 06/17/16 0908  . [START ON 06/20/2016] vancomycin (VANCOCIN) 1,250 mg in sodium chloride 0.9 % 250 mL IVPB  1,250 mg Intravenous To OR Theodis Blaze, MD      . witch hazel-glycerin (TUCKS) pad   Topical PRN Theodis Blaze, MD        LABS: Lab Results  Component Value Date   WBC 8.4 06/19/2016   HGB 11.0 (L) 06/19/2016   HCT 33.3 (L) 06/19/2016   MCV 94.6 06/19/2016   PLT 357 06/19/2016      Component Value Date/Time   NA 135 06/19/2016 0409   K 4.0 06/19/2016 0409   CL 101 06/19/2016 0409   CO2 29 06/19/2016 0409   GLUCOSE 104 (H) 06/19/2016 0409   BUN 5 (L) 06/19/2016 0409   CREATININE 0.71 06/19/2016 0409   CALCIUM 9.0 06/19/2016 0409   GFRNONAA >60 06/19/2016 0409   GFRAA >60 06/19/2016 0409   Lab Results  Component Value Date   INR 1.29 06/11/2016   INR 1.05 12/27/2010   INR 0.99 08/25/2009   No results found for: PTT  SOCIAL HISTORY: Social History   Social History  . Marital status: Married    Spouse name: N/A  . Number of children: N/A  . Years of education: N/A   Occupational History  . Not on file.   Social History Main Topics  . Smoking status: Former Smoker    Quit date: 02/12/1975  . Smokeless tobacco: Never Used  . Alcohol use 8.4 oz/week    14 Glasses of wine per week  . Drug use: Unknown  . Sexual activity: Not on file   Other Topics Concern  . Not on file   Social History Narrative   Married.  Father of 58, grandfather 44, great-grandfather 3.   Very active, but without routine exercise. Does yard work and intermittent walking, but nothing routine.   Does not smoke. Occasional alcohol.    FAMILY HISTORY: Family History  Problem Relation Age of Onset  . Hip fracture Mother     REVIEW OF SYSTEMS: Reviewed The patient as per history of present illness.  Psych: Patient denies dental  phobia.  DENTAL HISTORY: CHIEF COMPLAINT: The patient was referred by Dr. Cyndia Bent for a dental consultation.  HPI: Richard Simon is an 82 year old male recently diagnosed with severe aortic stenosis. Patient with anticipated aortic valve replacement in the near future. Patient is now seen as part of a medically necessary pre-heart valve surgery dental protocol examination rule out dental infection that may affect the patient's systemic health and anticipated heart valve surgery.  The patient currently denies acute toothaches, swellings, or abscesses. The patient was last seen by a dentist in November of 2017.  The patient was seen by  Dr. Vale Haven for an exam, dental cleaning, and full series of dental radiographs. The patient was treatment planned for extraction tooth numbers 7 and 12 by an oral surgeon and subsequent dental restorations, buildups, and crowns by Dr. Gilford Rile. The patient did not proceed with any dental treatment after the November appointment. The patient denies having any partial dentures. Patient denies having dental phobia.  DENTAL EXAMINATION: GENERAL: The patient is a well-developed, well-nourished male in no acute distress. HEAD AND NECK: No palpable neck lymphadenopathy. The patient denies acute TMJ symptoms.  INTRAORAL EXAM: Patient has normal saliva. There is no evidence of oral abscess formation. The patient has bilateral mandibular lingual tori. DENTITION: The patient is missing tooth numbers 1, 16, 17, 18, 19, 30, 31, and 32. There are retained roots in the area of tooth numbers 7 and 12. PERIODONTAL: Patient has chronic periodontitis with plaque accumulations, selective areas gingival recession, and no significant tooth mobility. DENTAL CARIES/SUBOPTIMAL RESTORATIONS: Patient has multiple dental caries. ENDODONTIC: The patient currently denies acute pulpitis symptoms. I do not see any evidence of periapical pathology or radiolucency. CROWN AND BRIDGE: Multiple crown  restorations are noted. PROSTHODONTIC: No history of partial dentures. OCCLUSION: Patient has a poor occlusal scheme but a stable occlusion.  RADIOGRAPHIC INTERPRETATION: An orthopantogram was obtained on 06/16/2016. There are multiple missing teeth. Multiple retained root segments numbers 7 and 12 are noted. Dental caries are noted.  There is moderate bone loss noted. Multiple dental restorations are noted. Multiple crown restorations are noted. No obvious periapical radiolucencies are noted.   ASSESSMENTS: 1. Severe aortic stenosis 2. Pre-heart valve surgery dental protocol examination 3. Multiple retained root segments 4. Dental caries 5. Chronic periodontitis with bone loss 6. Accretions  7. Gingival recession 8. Bilateral mandibular lingual tori 9. Multiple missing teeth 10. No history of partial dentures  11. Poor occlusal scheme and malocclusion 12. Risk for bleeding with invasive dental procedures   PLAN/RECOMMENDATIONS: 1. I discussed the risks, benefits, and complications of various treatment options with the patient in relationship to his medical and dental conditions, anticipated heart valve surgery, and risk for endocarditis. We discussed various treatment options to include no treatment, extraction of indicated teeth including tooth numbers 7, 12, and possibly #15, alveoloplasty, pre-prosthetic surgery as indicated, periodontal therapy, dental restorations, root canal therapy, crown and bridge therapy, implant therapy, and replacement of missing teeth as indicated. The patient currently wishes to proceed with extraction of tooth numbers 7 and 12 with alveoloplasty and consideration  of extraction of tooth #15 after further discussion with Dr. Vale Haven. Patient also agreed to proceed with dental cleaning at this time. I did contact Dr. Vale Haven, and she indicated that she agrees with extraction of tooth numbers 7 and 12 but will restore tooth #15 with a crown in the  future. I will discuss this again with the patient and proceed with dental extractions with alveoloplasty as indicated in the operating room with general anesthesia along with a dental cleaning on Tuesday, 06/20/2016 and 9 AM at [facility]. The patient will then proceed with heart valve surgery at the discretion of Dr. Cyndia Bent and Dr. Burt Knack. Once medically cleared for the heart valve surgery, the patient will follow with Dr. Vale Haven for other dental treatment as indicated. Patient will require antibiotic premedication prior to invasive dental procedures at that time due to the anticipated aortic valve replacement.   2. Discussion of findings with medical team and coordination of future medical and dental care as needed.    Lenn Cal, DDS

## 2016-06-19 NOTE — Progress Notes (Signed)
PROGRESS NOTE  Richard Simon [id number] DOB: 1929-11-03 DOA: 06/07/2016   PCP: Merrilee Seashore, MD   LOS: 12 days   Brief Narrative: 83 y.o. male with medical history significant of atrial fibrillation on Pradaxa, CAD, CABG, hyperlipidemia, hypertension, moderate to severe aortic stenosis is coming to the emergency department with complaints of on/off chest pressure, radiates to his back, improved by rest for the past week associated with dyspnea, orthopnea and fatigue.  He was found to be hyponatremic in the ED.  He was felt to be dehydrated, and received gentle IV hydration, which resulted in improvement in his sodium however he developed fluid overload quite rapidly.  2D echo later showed critical aortic stenosis and cardiology was consulted, patient was started on IV Lasix, his sodium has improved and cardiology is currently evaluating for TAVR.  Assessment & Plan:  Chest pain - 2D echo done on 2/22 showed an ejection fraction of 45-50%, with mildly reduced systolic function, with diffuse hypokinesis and critical aortic valve stenosis.  Valve area was calculated at 0.26 cm.  Prior to the echo done in April 2017 showed normal EF of 0000000, normal systolic function, with a valve area of 0.55 - per cardiology may need TAVR - no chest pain this AM  - appreciate cardiothoracic surgery assistance   Stage D severe and symptomatic AS with Acute on chronic combined diastolic and systolic CHF, NYHA class III - echo shows a severely calcified trileaflet aortic valve with severely restricted leaflet mobility - LV systolic function has decreased compared to last year - cath shows patent grafts and no significant ischemic territory - per vascular surgery, TAVR to be considered but vascular surgery recommends that pt is clear from pulmonary stand point  - will also need orthopantogram and consult Dr. Enrique Sack, pending  - PCCM cleared for surgery with close monitoring in SDU vs ICU post  op  CAP - Completed 7 days therapy with ABX but opacities still seen on imaging studies  - will ask PCCM to review and provide pre op clearance for pt  - no fevers and WBC is now WNL  Hyponatremia - Patient was initially hypovolemic and his sodium improved with gentle hydration, however he developed fluid overload rather rapidly due to his critical aortic stenosis, with worsening hyponatremia.   - He required IV Lasix, with improvement in his respiratory status as well as improvement in his sodium levels. - Na is WNL this AM  Plaque-like irregularity along the posterior aspect of the urinary bladder wall - adjacent to a small filling defect which may have an attachment to the wall of the urinary bladder - findings are concerning for potential urothelial neoplasm, and follow-up nonemergent evaluation by Urology is suggested in the near future to better evaluate these findings - when pt ready for discharge will set up follow up appointment   Acute encephalopathy - Patient with intermittent confusion, this is likely in the setting of infectious process, as well as probable delirium related to inpatient stay - appears to be stable at baseline   CAD (coronary artery disease) - Continue home medications, cardiology following   Essential hypertension - Blood pressure controlled 113/68, continue current regimen  Dyslipidemia, goal LDL below 70 - Continue Crestor  Atrial fibrillation, permanent (HCC) - CHA2DS2-VASc Score of at least 4. - Continue atenolol for rate control, on Heparin drip for now  - per cardiology, added digoxin on 3/4 - HR now in 80's  DVT prophylaxis: SCDs, Heparin drip  Code Status: Full code Family  Communication: no family at bedside  Disposition Plan: remains inpatient until surgery done   Consultants:   Cardiology  Cardiothoracic surgery   PCCM  Procedures:   2D echo Study Conclusions - Left ventricle: The cavity size was normal. Wall thickness  was increased in a pattern of mild LVH. Systolic function was normal. The estimated ejection fraction was in the range of 55% to 60%. Wall motion was normal; there were no regional wall motion abnormalities. - Aortic valve: Valve mobility was restricted. There was severe stenosis. There was moderate regurgitation. Peak velocity (S): 451 cm/s. Mean gradient (S): 43 mm Hg. - Mitral valve: Moderately calcified annulus. There was mild regurgitation. - Left atrium: The atrium was moderately dilated. - Pulmonary arteries: Systolic pressure was mildly increased. PA peak pressure: 32 mm Hg (S).  Impressions: - Mean gradient 36mmHg from prior echocardiogram. Aortic stenosis has increased.  Antimicrobials:  Ceftriaxone 2/21 >> 2/28  Azithromycin 2/21 >> 2/28  Subjective: No events overnight.   Objective: Vitals:   06/19/16 0120 06/19/16 0500 06/19/16 0944 06/19/16 1300  BP: 104/83 117/73 116/65 118/69  Pulse: 94 85 88 84  Resp: (!) 25 20  18   Temp:  98.9 F (37.2 C)  98.7 F (37.1 C)  TempSrc:  Oral  Oral  SpO2: 100% 98%  99%  Weight:  77.9 kg (171 lb 11.2 oz)    Height:        Intake/Output Summary (Last 24 hours) at 06/19/16 1715 Last data filed at 06/19/16 [id number]  Gross per 24 hour  Intake           530.75 ml  Output             1450 ml  Net          -919.25 ml   Filed Weights   06/17/16 0600 06/18/16 0500 06/19/16 0500  Weight: 77.6 kg (171 lb 1.6 oz) 77.8 kg (171 lb 9.6 oz) 77.9 kg (171 lb 11.2 oz)    Examination: Constitutional: NAD Vitals:   06/19/16 0120 06/19/16 0500 06/19/16 0944 06/19/16 1300  BP: 104/83 117/73 116/65 118/69  Pulse: 94 85 88 84  Resp: (!) 25 20  18   Temp:  98.9 F (37.2 C)  98.7 F (37.1 C)  TempSrc:  Oral  Oral  SpO2: 100% 98%  99%  Weight:  77.9 kg (171 lb 11.2 oz)    Height:       Eyes: PERRL, lids and conjunctivae normal Respiratory: diminished breath sounds at bases   Musculoskeletal: no clubbing / cyanosis.  Neurologic: CN 2-12  grossly intact. Strength 5/5 in all 4.  Psychiatric: Alert and oriented 3  Data Reviewed: I have personally reviewed following labs and imaging studies  CBC:  Recent Labs Lab 06/15/16 0410 06/16/16 0437 06/17/16 0350 06/18/16 0342 06/19/16 0409  WBC 14.3* 12.9* 9.1 9.3 8.4  NEUTROABS 11.7* 10.0* 6.3 6.4 6.0  HGB 11.4* 10.8* 10.8* 11.5* 11.0*  HCT 33.8* 32.4* 32.0* 33.5* 33.3*  MCV 94.2 95.6 94.7 94.1 94.6  PLT 369 349 363 367 [id number]   Basic Metabolic Panel:  Recent Labs Lab 06/13/16 0352 06/16/16 0437 06/17/16 0350 06/18/16 0342 06/19/16 0409  NA 130* 133* 134* 132* 135  K 4.1 3.8 3.8 3.8 4.0  CL 93* 98* 98* 97* 101  CO2 29 26 27 24 29   GLUCOSE 109* 106* 99 108* 104*  BUN 11 7 10 8  5*  CREATININE 0.69 0.67 0.73 0.70 0.71  CALCIUM 8.8* 8.9 9.0 8.9 9.0  Radiology Studies: Dg Orthopantogram  Result Date: 06/16/2016 CLINICAL DATA:  Aortic stenosis. EXAM: ORTHOPANTOGRAM/PANORAMIC COMPARISON:  CT 01/03/2009. FINDINGS: Mild periapical lucency lucency is noted over the left lower most posterior remaining tooth. Subtle lucencies noted over the posterior most aspect of the left inferior mandibular ramus. Dental evaluation suggested. No other focal bony abnormalities identified. No evidence of fracture. IMPRESSION: Mild periapical lucency noted over the left lower most posterior remaining tooth. Subtle lucencies noted over the posterior aspect of the left inferior mandibular ramus. Dental evaluation suggested. Electronically Signed   By: Marcello Moores  Register   On: 06/16/2016 15:20   Ct Cardiac Morph/pulm Vein W/cm&w/o Ca Score  Addendum Date: 06/16/2016   ADDENDUM REPORT: 06/16/2016 09:50 EXAM: OVER-READ INTERPRETATION  CT CHEST The following report is an over-read performed by radiologist Dr. Rebekah [facility] [place] on 06/16/2016. This over-read does not include interpretation of cardiac or coronary anatomy or pathology. The cardiac CT interpretation by the  cardiologist is attached. COMPARISON:  None. FINDINGS: Full description of extracardiac findings on separate dictation for contemporaneously obtained CTA of the chest, abdomen and pelvis 06/15/2016. IMPRESSION: See separate dictation for contemporaneously obtained CTA of the chest, abdomen and pelvis 06/15/2016 for full description of extracardiac findings. Electronically Signed   By: Vinnie Langton M.D.   On: 06/16/2016 09:50   Result Date: 06/16/2016 CLINICAL DATA:  83 year old male with severe aortic stenosis. EXAM: Cardiac TAVR CT TECHNIQUE: The patient was scanned on a Philips 256 scanner. A 120 kV retrospective scan was triggered in the descending thoracic aorta at 111 HU's. Gantry rotation speed was 270 msecs and collimation was .9 mm. 20 mg of iv Metoprolol and no nitro were given. The 3D data set was reconstructed in 5% intervals of the R-R cycle. Systolic and diastolic phases were analyzed on a dedicated work station using MPR, MIP and VRT modes. The patient received 80 cc of contrast. FINDINGS: Aortic Valve: Trileaflet, severely calcified and thickened with severely restricted leaflet opening and minimal calcifications extending into the LVOT. Aorta:  Normal size, no dissection.  Mild diffuse calcifications. Sinotubular Junction:  30 x 30 mm Ascending Thoracic Aorta:  33 x 32 mm Aortic Arch:  24 x 24 mm Descending Thoracic Aorta:  24 x 24 mm Sinus of Valsalva Measurements: Non-coronary:  37 mm Right -coronary:  33 mm Left -coronary:  34 mm Coronary Artery Height above Annulus: Left Main:  16 mm Right Coronary:  24 mm Virtual Basal Annulus Measurements: Maximum/Minimum Diameter:  32 x 22 mm Perimeter:  94 mm Area:  596 mm2 Optimum Fluoroscopic Angle for Delivery:  RAO 2 CRA 1 Dilated pulmonary artery measuring 32 x 29 mm No thrombus in the left atrial appendage. Normal pulmonary vein drainage into the left atrium. No ASD/VSD seen. IMPRESSION: 1. Trileaflet, severely calcified and thickened with  severely restricted leaflet opening and minimal calcifications extending into the LVOT. Annular measurements suitable for delivery of a 29 mm Edwards-SAPIEN 3 TAVR valve. 2. Optimum Fluoroscopic Angle for Delivery:  RAO 2 CRA 1 Ena Dawley Electronically Signed: By: Ena Dawley On: 06/15/2016 18:06   Ct Angio Chest/abd/pel For Dissection W And/or W/wo  Result Date: 06/16/2016 CLINICAL DATA:  83 year old male with history of severe aortic stenosis. Preprocedural study prior to potential transcatheter aortic valve replacement (TAVR). EXAM: CT ANGIOGRAPHY CHEST, ABDOMEN AND PELVIS TECHNIQUE: Multidetector CT imaging through the chest, abdomen and pelvis was performed using the standard protocol during bolus administration of intravenous contrast. Multiplanar reconstructed images and MIPs were  obtained and reviewed to evaluate the vascular anatomy. CONTRAST:  80 mL of Isovue 370. COMPARISON:  No priors. FINDINGS: CTA CHEST FINDINGS Cardiovascular: Heart size is mildly enlarged with biatrial dilatation. There is no significant pericardial fluid, thickening or pericardial calcification. There is aortic atherosclerosis, as well as atherosclerosis of the great vessels of the mediastinum and the coronary arteries, including calcified atherosclerotic plaque in the left main, left anterior descending and left circumflex coronary arteries. Status post median sternotomy for CABG, including LIMA to the LAD. Mediastinum/Lymph Nodes: Multiple prominent borderline enlarged mediastinal and hilar lymph nodes are noted, likely reactive. Esophagus is unremarkable in appearance. No axillary lymphadenopathy. Lungs/Pleura: Moderate right and small left pleural effusions lie dependently. Throughout the lungs bilaterally there are relatively symmetric areas of ground-glass attenuation and septal thickening which are most evident throughout the mid to upper lungs. Lower lungs appear relatively normal, with a few patchy areas of  ground-glass attenuation. Musculoskeletal/Soft Tissues: Median sternotomy wires. There are no aggressive appearing lytic or blastic lesions noted in the visualized portions of the skeleton. CTA ABDOMEN AND PELVIS FINDINGS Hepatobiliary: Liver has a slightly shrunken appearance and nodular contour, suggestive of underlying cirrhosis. No cystic or solid hepatic lesions. No intra or extrahepatic biliary ductal dilatation. Gallbladder is normal in appearance. Pancreas: No pancreatic mass. No pancreatic ductal dilatation. No pancreatic or peripancreatic fluid or inflammatory changes. Spleen: Unremarkable. Adrenals/Urinary Tract: Bilateral kidneys and bilateral adrenal glands are unremarkable in appearance. No hydroureteronephrosis. Small filling defect in the urinary bladder measuring 0.6 x 1.3 x 1.6 cm (axial image 254 of series 401 and coronal image 108 of series 403), with potential stalk like attachment to the wall of the urinary bladder (coronal image 108 of series 403). There is also some plaque-like irregularity along the posterior aspect of the urinary bladder wall, best appreciated on sagittal image 124 of series 404. Stomach/Bowel: The appearance of the stomach is normal. There is no pathologic dilatation of small bowel or colon. Vascular/Lymphatic: Aortic atherosclerosis, with vascular findings and measurements pertinent to potential TAVR procedure, as detailed below. 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 Throughout the abdominal aorta there multiple ulcerated plaques. The largest of these plaques is best demonstrated in cross-section on axial image 200 of series 401 adjacent to the inferior mesenteric artery origin, where it appears as a short segment dissection (however, this does not appear to be a true dissection). 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 The celiac axis, superior mesenteric artery and inferior mesenteric artery are all patent without definite hemodynamically significant stenosis. Two right-sided and one left-sided renal arteries are all patent without hemodynamically significant stenosis. No lymphadenopathy noted in the abdomen or pelvis. Reproductive: Prostate gland and seminal vesicles are unremarkable in appearance. Other: No significant volume of ascites.  No pneumoperitoneum. Musculoskeletal: There are no aggressive appearing lytic or blastic lesions noted in the visualized portions of the skeleton. VASCULAR MEASUREMENTS PERTINENT TO TAVR: AORTA: Minimal Aortic Diameter -  19 x 15 mm Severity of Aortic Calcification -  moderate to severe RIGHT PELVIS: Right Common Iliac Artery - Minimal Diameter - 11.2 x 8.3 mm Tortuosity - severe Calcification - moderate Right External Iliac Artery - Minimal Diameter - 8.7 x 9.1 mm Tortuosity - severe Calcification - mild Right Common  Femoral Artery - Focal aneurysmal dilatation up to 17 mm with large ulcerated plaque. Minimal Diameter - 8.5 x 6.3 mm Tortuosity - mild Calcification - moderate LEFT PELVIS: Left Common Iliac Artery - Minimal Diameter - 8.1 x 7.8 mm Tortuosity - severe Calcification - moderate Left External Iliac Artery - Minimal Diameter - 9.1 x 8.5 mm Tortuosity - severe Calcification - mild Left Common Femoral Artery - Minimal Diameter - 8.7 x 7.6 mm Tortuosity - mild Calcification - moderate Review of the MIP images confirms the above findings. IMPRESSION: 1. Vascular findings and measurements pertinent to potential TAVR procedure, as detailed above. From a size criteria, the patient has suitable pelvic arterial access bilaterally. 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 In addition, there is a large ulcerated plaque in the infrarenal abdominal aorta (adjacent to the origin of the inferior mesenteric artery), best appreciated on axial image 200 of series 401, which in the axial plane has an appearance of a very short segment dissection. Caution should be taken when traversing this region with a catheter. 2. Severe thickening calcification of the aortic valve, compatible with the reported clinical history of severe aortic stenosis. 3. Extensive ground-glass attenuation and septal thickening throughout the mid to upper lungs bilaterally, strongly favored to be related to an acute infection. There may also be a background of very mild interstitial pulmonary edema, best appreciated in the lower lungs. 4. Moderate right and small left pleural effusions lying dependently. 5. Plaque-like irregularity along the posterior aspect of the urinary bladder wall, adjacent to a small filling defect which may have an attachment to the wall of the urinary bladder. These findings are concerning for potential urothelial neoplasm, and follow-up nonemergent evaluation by Urology is suggested in the near future to better evaluate these findings. 6. Morphologic changes in the liver suggestive of early cirrhosis, as above. 7. Additional incidental findings, as above. Electronically Signed   By: Vinnie Langton M.D.   On: 06/16/2016 09:01    Scheduled Meds: . aspirin EC  81 mg Oral Daily  . atenolol  75 mg Oral Daily  . [START ON 06/20/2016]  ceFAZolin (ANCEF) IV  2 g Intravenous Once  . diltiazem  240 mg Oral Daily  . docusate sodium  100 mg Oral BID  . rosuvastatin  10 mg Oral Daily  . sodium chloride flush  3 mL Intravenous Q12H  . sodium chloride flush  3 mL Intravenous Q12H   Continuous Infusions: . heparin     Faye Ramsay, MD  [facility] Pager [phone]  If 7PM-7AM, please contact  night-coverage [email or web address] Password [email or web address] 06/19/2016, 5:15 PM

## 2016-06-19 NOTE — Progress Notes (Signed)
I have called patient's son to come and sign consent for dental extraction tomorrow but he did not respond. I left a message for him to call back.  Ferdinand Lango, RN

## 2016-06-20 ENCOUNTER — Inpatient Hospital Stay (HOSPITAL_COMMUNITY): Admission: RE | Admit: 2016-06-20 | Payer: Medicare HMO | Source: Ambulatory Visit

## 2016-06-20 ENCOUNTER — Encounter (HOSPITAL_COMMUNITY)
Admission: EM | Disposition: A | Discharge status: Skilled Nursing Facility | Payer: Self-pay | Source: Home / Self Care | Attending: Internal Medicine

## 2016-06-20 ENCOUNTER — Inpatient Hospital Stay (HOSPITAL_COMMUNITY): Payer: Medicare HMO | Admitting: Anesthesiology

## 2016-06-20 DIAGNOSIS — K0530 Chronic periodontitis, unspecified: Secondary | ICD-10-CM

## 2016-06-20 DIAGNOSIS — K029 Dental caries, unspecified: Secondary | ICD-10-CM | POA: Diagnosis present

## 2016-06-20 DIAGNOSIS — K036 Deposits [accretions] on teeth: Secondary | ICD-10-CM | POA: Diagnosis present

## 2016-06-20 DIAGNOSIS — K083 Retained dental root: Secondary | ICD-10-CM | POA: Diagnosis present

## 2016-06-20 HISTORY — PX: MULTIPLE EXTRACTIONS WITH ALVEOLOPLASTY: SHX5342

## 2016-06-20 LAB — CBC WITH DIFFERENTIAL/PLATELET
Basophils Absolute: 0 10*3/uL (ref 0.0–0.1)
Basophils Relative: 0 %
Eosinophils Absolute: 0.3 10*3/uL (ref 0.0–0.7)
Eosinophils Relative: 4 %
HEMATOCRIT: 33.1 % — AB (ref 39.0–52.0)
HEMOGLOBIN: 11.1 g/dL — AB (ref 13.0–17.0)
LYMPHS ABS: 1.7 10*3/uL (ref 0.7–4.0)
LYMPHS PCT: 21 %
MCH: 31.6 pg (ref 26.0–34.0)
MCHC: 33.5 g/dL (ref 30.0–36.0)
MCV: 94.3 fL (ref 78.0–100.0)
Monocytes Absolute: 0.8 10*3/uL (ref 0.1–1.0)
Monocytes Relative: 9 %
NEUTROS ABS: 5.4 10*3/uL (ref 1.7–7.7)
Neutrophils Relative %: 66 %
PLATELETS: 339 10*3/uL (ref 150–400)
RBC: 3.51 MIL/uL — AB (ref 4.22–5.81)
RDW: 12.7 % (ref 11.5–15.5)
WBC: 8.2 10*3/uL (ref 4.0–10.5)

## 2016-06-20 LAB — ANCA TITERS
Atypical P-ANCA titer: <1:20 {titer}
P-ANCA: <1:20 {titer}

## 2016-06-20 LAB — ANA W/REFLEX IF POSITIVE: ANA: NEGATIVE

## 2016-06-20 LAB — SURGICAL PCR SCREEN
MRSA, PCR: NEGATIVE
STAPHYLOCOCCUS AUREUS: NEGATIVE

## 2016-06-20 LAB — ANTI-SCLERODERMA ANTIBODY

## 2016-06-20 LAB — CYCLIC CITRUL PEPTIDE ANTIBODY, IGG/IGA: CCP ANTIBODIES IGG/IGA: 6 U (ref 0–19)

## 2016-06-20 LAB — HEPARIN LEVEL (UNFRACTIONATED): Heparin Unfractionated: 0.55 IU/mL (ref 0.30–0.70)

## 2016-06-20 LAB — GLUCOSE, CAPILLARY: GLUCOSE-CAPILLARY: 100 mg/dL — AB (ref 65–99)

## 2016-06-20 SURGERY — MULTIPLE EXTRACTION WITH ALVEOLOPLASTY
Anesthesia: General

## 2016-06-20 MED ORDER — LACTATED RINGERS IV SOLN
INTRAVENOUS | Status: DC
  Administered 2016-06-20: 09:00:00 via INTRAVENOUS
  Filled 2016-06-20: qty 1000

## 2016-06-20 MED ORDER — BUPIVACAINE-EPINEPHRINE 0.5% -1:200000 IJ SOLN
INTRAMUSCULAR | Status: DC | PRN
  Administered 2016-06-20: 6.8 mL

## 2016-06-20 MED ORDER — CEFAZOLIN SODIUM-DEXTROSE 2-3 GM-% IV SOLR
INTRAVENOUS | Status: DC | PRN
  Administered 2016-06-20: 2 g via INTRAVENOUS

## 2016-06-20 MED ORDER — SUGAMMADEX SODIUM 200 MG/2ML IV SOLN
INTRAVENOUS | Status: DC | PRN
  Administered 2016-06-20: 200 mg via INTRAVENOUS

## 2016-06-20 MED ORDER — HEMOSTATIC AGENTS (NO CHARGE) OPTIME
TOPICAL | Status: DC | PRN
  Administered 2016-06-20: 1 via TOPICAL

## 2016-06-20 MED ORDER — HEPARIN (PORCINE) IN NACL 100-0.45 UNIT/ML-% IJ SOLN
1500.0000 [IU]/h | INTRAMUSCULAR | Status: DC
  Administered 2016-06-21: 1500 [IU]/h via INTRAVENOUS
  Filled 2016-06-20 (×2): qty 250

## 2016-06-20 MED ORDER — LIDOCAINE-EPINEPHRINE 2 %-1:100000 IJ SOLN
INTRAMUSCULAR | Status: AC
  Filled 2016-06-20: qty 10.2

## 2016-06-20 MED ORDER — FENTANYL CITRATE (PF) 100 MCG/2ML IJ SOLN
INTRAMUSCULAR | Status: DC | PRN
  Administered 2016-06-20 (×2): 50 ug via INTRAVENOUS
  Administered 2016-06-20: 100 ug via INTRAVENOUS
  Administered 2016-06-20: 50 ug via INTRAVENOUS

## 2016-06-20 MED ORDER — FENTANYL CITRATE (PF) 250 MCG/5ML IJ SOLN
INTRAMUSCULAR | Status: AC
  Filled 2016-06-20: qty 5

## 2016-06-20 MED ORDER — LIDOCAINE HCL (CARDIAC) 20 MG/ML IV SOLN
INTRAVENOUS | Status: DC | PRN
  Administered 2016-06-20: 40 mg via INTRAVENOUS

## 2016-06-20 MED ORDER — LACTATED RINGERS IV SOLN
INTRAVENOUS | Status: DC | PRN
  Administered 2016-06-20: 09:00:00 via INTRAVENOUS

## 2016-06-20 MED ORDER — BUPIVACAINE-EPINEPHRINE (PF) 0.5% -1:200000 IJ SOLN
INTRAMUSCULAR | Status: AC
  Filled 2016-06-20: qty 3.6

## 2016-06-20 MED ORDER — BACITRACIN ZINC 500 UNIT/GM EX OINT
1.0000 "application " | TOPICAL_OINTMENT | Freq: Three times a day (TID) | CUTANEOUS | Status: DC
  Administered 2016-06-20 – 2016-06-27 (×19): 1 via TOPICAL
  Filled 2016-06-20 (×2): qty 28.35

## 2016-06-20 MED ORDER — ESMOLOL HCL 100 MG/10ML IV SOLN
INTRAVENOUS | Status: AC
  Filled 2016-06-20: qty 10

## 2016-06-20 MED ORDER — PHENYLEPHRINE HCL 10 MG/ML IJ SOLN
INTRAVENOUS | Status: DC | PRN
  Administered 2016-06-20: 20 ug/min via INTRAVENOUS

## 2016-06-20 MED ORDER — ROCURONIUM BROMIDE 100 MG/10ML IV SOLN
INTRAVENOUS | Status: DC | PRN
  Administered 2016-06-20: 30 mg via INTRAVENOUS
  Administered 2016-06-20: 10 mg via INTRAVENOUS

## 2016-06-20 MED ORDER — AMINOCAPROIC ACID SOLUTION 5% (50 MG/ML)
10.0000 mL | ORAL | Status: AC
  Administered 2016-06-20 (×5): 10 mL via ORAL
  Filled 2016-06-20 (×2): qty 100

## 2016-06-20 MED ORDER — ARTIFICIAL TEARS OP OINT
TOPICAL_OINTMENT | OPHTHALMIC | Status: DC | PRN
  Administered 2016-06-20: 1 via OPHTHALMIC

## 2016-06-20 MED ORDER — OXYMETAZOLINE HCL 0.05 % NA SOLN
NASAL | Status: AC
  Filled 2016-06-20: qty 15

## 2016-06-20 MED ORDER — ONDANSETRON HCL 4 MG/2ML IJ SOLN
INTRAMUSCULAR | Status: AC
  Filled 2016-06-20: qty 2

## 2016-06-20 MED ORDER — PROPOFOL 10 MG/ML IV BOLUS
INTRAVENOUS | Status: DC | PRN
  Administered 2016-06-20 (×2): 50 mg via INTRAVENOUS

## 2016-06-20 MED ORDER — ONDANSETRON HCL 4 MG/2ML IJ SOLN
INTRAMUSCULAR | Status: DC | PRN
Start: 2016-06-20 — End: 2016-06-20
  Administered 2016-06-20: 4 mg via INTRAVENOUS

## 2016-06-20 MED ORDER — SUGAMMADEX SODIUM 200 MG/2ML IV SOLN
INTRAVENOUS | Status: AC
  Filled 2016-06-20: qty 2

## 2016-06-20 MED ORDER — MORPHINE SULFATE (PF) 2 MG/ML IV SOLN
1.0000 mg | INTRAVENOUS | Status: DC | PRN
  Administered 2016-06-20: 1 mg via INTRAVENOUS
  Filled 2016-06-20: qty 1

## 2016-06-20 MED ORDER — LACTATED RINGERS IV SOLN
INTRAVENOUS | Status: DC
  Administered 2016-06-20: 14:00:00 via INTRAVENOUS

## 2016-06-20 MED ORDER — 0.9 % SODIUM CHLORIDE (POUR BTL) OPTIME
TOPICAL | Status: DC | PRN
  Administered 2016-06-20: 1000 mL

## 2016-06-20 MED ORDER — FENTANYL CITRATE (PF) 100 MCG/2ML IJ SOLN: 25.0000 ug | INTRAMUSCULAR | Status: DC | PRN

## 2016-06-20 SURGICAL SUPPLY — 36 items
ALCOHOL 70% 16 OZ (MISCELLANEOUS) ×3 IMPLANT
ATTRACTOMAT 16X20 MAGNETIC DRP (DRAPES) ×3 IMPLANT
BLADE SURG 15 STRL LF DISP TIS (BLADE) ×2 IMPLANT
BLADE SURG 15 STRL SS (BLADE) ×4
COVER SURGICAL LIGHT HANDLE (MISCELLANEOUS) ×3 IMPLANT
GAUZE PACKING FOLDED 2  STR (GAUZE/BANDAGES/DRESSINGS) ×2
GAUZE PACKING FOLDED 2 STR (GAUZE/BANDAGES/DRESSINGS) ×1 IMPLANT
GAUZE SPONGE 4X4 16PLY XRAY LF (GAUZE/BANDAGES/DRESSINGS) IMPLANT
GLOVE BIOGEL PI IND STRL 6 (GLOVE) ×1 IMPLANT
GLOVE BIOGEL PI INDICATOR 6 (GLOVE) ×2
GLOVE SURG ORTHO 8.0 STRL STRW (GLOVE) ×3 IMPLANT
GLOVE SURG SS PI 6.0 STRL IVOR (GLOVE) ×3 IMPLANT
GOWN STRL REUS W/ TWL LRG LVL3 (GOWN DISPOSABLE) ×1 IMPLANT
GOWN STRL REUS W/TWL 2XL LVL3 (GOWN DISPOSABLE) ×3 IMPLANT
GOWN STRL REUS W/TWL LRG LVL3 (GOWN DISPOSABLE) ×2
HEMOSTAT SURGICEL 2X14 (HEMOSTASIS) ×6 IMPLANT
KIT BASIN OR (CUSTOM PROCEDURE TRAY) ×3 IMPLANT
KIT ROOM TURNOVER OR (KITS) ×3 IMPLANT
MANIFOLD NEPTUNE WASTE (CANNULA) ×3 IMPLANT
NEEDLE BLUNT 16X1.5 OR ONLY (NEEDLE) ×3 IMPLANT
NS IRRIG 1000ML POUR BTL (IV SOLUTION) ×3 IMPLANT
PACK EENT II TURBAN DRAPE (CUSTOM PROCEDURE TRAY) ×3 IMPLANT
PAD ARMBOARD 7.5X6 YLW CONV (MISCELLANEOUS) ×3 IMPLANT
SPONGE SURGIFOAM ABS GEL 100 (HEMOSTASIS) IMPLANT
SPONGE SURGIFOAM ABS GEL 12-7 (HEMOSTASIS) IMPLANT
SPONGE SURGIFOAM ABS GEL SZ50 (HEMOSTASIS) IMPLANT
SUCTION FRAZIER HANDLE 10FR (MISCELLANEOUS) ×2
SUCTION TUBE FRAZIER 10FR DISP (MISCELLANEOUS) ×1 IMPLANT
SUT CHROMIC 3 0 PS 2 (SUTURE) ×6 IMPLANT
SUT CHROMIC 4 0 P 3 18 (SUTURE) ×6 IMPLANT
SYR 50ML SLIP (SYRINGE) ×3 IMPLANT
TOWEL OR 17X26 10 PK STRL BLUE (TOWEL DISPOSABLE) ×3 IMPLANT
TUBE CONNECTING 12'X1/4 (SUCTIONS) ×1
TUBE CONNECTING 12X1/4 (SUCTIONS) ×2 IMPLANT
WATER TABLETS ICX (MISCELLANEOUS) ×3 IMPLANT
YANKAUER SUCT BULB TIP NO VENT (SUCTIONS) ×3 IMPLANT

## 2016-06-20 NOTE — Anesthesia Procedure Notes (Addendum)
Procedure Name: Intubation Date/Time: 06/20/2016 10:14 AM Performed by: Jacquiline Doe A Pre-anesthesia Checklist: Patient identified, Emergency Drugs available, Suction available and Patient being monitored Patient Re-evaluated:Patient Re-evaluated prior to inductionOxygen Delivery Method: Circle System Utilized and Circle system utilized Preoxygenation: Pre-oxygenation with 100% oxygen Intubation Type: IV induction and Cricoid Pressure applied Ventilation: Mask ventilation without difficulty and Oral airway inserted - appropriate to patient size Laryngoscope Size: Mac and 4 Grade View: Grade I Tube type: Oral Tube size: 8.0 mm Number of attempts: 1 Airway Equipment and Method: Stylet Placement Confirmation: ETT inserted through vocal cords under direct vision,  positive ETCO2 and breath sounds checked- equal and bilateral Secured at: 23 cm Tube secured with: Tape Dental Injury: Teeth and Oropharynx as per pre-operative assessment

## 2016-06-20 NOTE — Progress Notes (Signed)
RN educating on consent for teeth extraction with patient and son, Richard Simon, which patient lives with. RN asked Richard Simon if he was POA. Richard Simon stated his "brother, Richard Simon, is POA", patient confirmed. Pt intermittent confusion, unable to sign consent. Unable to reach [name], [place], in [place]. Richard Simon signed consent, pt agreed to planned procedure of teeth extraction, pt escorted to OR by this RN.   1115: Richard Simon returned phone call unaware of patient being in hospital at all. RN verified POA status and updated Richard Simon on plan and progression of hospitalization. Richard Simon to call other siblings to update. RN requested copy of POA to put on file.

## 2016-06-20 NOTE — Anesthesia Preprocedure Evaluation (Addendum)
Anesthesia Evaluation  Patient identified by MRN, date of birth, ID band Patient awake    Reviewed: Allergy & Precautions, H&P , NPO status , Patient's Chart, lab work & pertinent test results, reviewed documented beta blocker date and time   Airway Mallampati: III  TM Distance: >3 FB Neck ROM: Full    Dental no notable dental hx. (+) Poor Dentition, Dental Advisory Given   Pulmonary neg pulmonary ROS, former smoker,    Pulmonary exam normal breath sounds clear to auscultation       Cardiovascular hypertension, Pt. on medications and On Home Beta Blockers + CAD and + CABG  + dysrhythmias Atrial Fibrillation + Valvular Problems/Murmurs AS  Rhythm:Irregular Rate:Tachycardia + Systolic murmurs    Neuro/Psych negative neurological ROS  negative psych ROS   GI/Hepatic negative GI ROS, Neg liver ROS,   Endo/Other  negative endocrine ROS  Renal/GU negative Renal ROS  negative genitourinary   Musculoskeletal   Abdominal   Peds  Hematology negative hematology ROS (+)   Anesthesia Other Findings   Reproductive/Obstetrics negative OB ROS                            Anesthesia Physical Anesthesia Plan  ASA: IV  Anesthesia Plan: General   Post-op Pain Management:    Induction: Intravenous  Airway Management Planned: Nasal ETT and Video Laryngoscope Planned  Additional Equipment: Arterial line  Intra-op Plan:   Post-operative Plan: Extubation in OR  Informed Consent: I have reviewed the patients History and Physical, chart, labs and discussed the procedure including the risks, benefits and alternatives for the proposed anesthesia with the patient or authorized representative who has indicated his/her understanding and acceptance.   Dental advisory given  Plan Discussed with: CRNA  Anesthesia Plan Comments:         Anesthesia Quick Evaluation

## 2016-06-20 NOTE — Discharge Instructions (Signed)

## 2016-06-20 NOTE — Progress Notes (Signed)
ANTICOAGULATION CONSULT NOTE - Follow Up Consult  Pharmacy Consult for Heparin (Pradaxa on hold) Indication: atrial fibrillation  Assessment: 39 yoM on heparin for afib while Pradaxa on hold, in anticipation of TAVR.  Heparin level continues to be therapeutic on 1500 units/hr this am prior to infusion being stopped. Hemoglobin is stable, platelets wnl. No overt bleeding noted.   S/p dental extraction this am, heparin to remain off today and restart at previous rate tomorrow morning.   Goal of Therapy:  Heparin level 0.3-0.7 units/ml Monitor platelets by anticoagulation protocol: Yes   Plan:  -Restart heparin at 1500 units/hr tomorrow 3/7 -Monitor heparin level daily, CBC, s/sx bleeding   Allergies  Allergen Reactions  . No Known Allergies     Patient Measurements: Height: 5\' 9"  (175.3 cm) Weight: 163 lb 9.6 oz (74.2 kg) IBW/kg (Calculated) : 70.7  Vital Signs: Temp: 98 F (36.7 C) (03/06 1215) Temp Source: Oral (03/06 0521) BP: 105/73 (03/06 1215) Pulse Rate: 89 (03/06 1215)  Labs:  Recent Labs  06/18/16 0342 06/19/16 0409 06/20/16 0258  HGB 11.5* 11.0* 11.1*  HCT 33.5* 33.3* 33.1*  PLT 367 357 339  HEPARINUNFRC 0.43 0.43 0.55  CREATININE 0.70 0.71  --     Estimated Creatinine Clearance: 68.7 mL/min (by C-G formula based on SCr of 0.71 mg/dL).  Erin Hearing PharmD., BCPS Clinical Pharmacist Pager [phone] 06/20/2016 2:01 PM

## 2016-06-20 NOTE — Transfer of Care (Signed)
Immediate Anesthesia Transfer of Care Note  Patient: Richard Simon  Procedure(s) Performed: Procedure(s): EXTRACTION of #7, 12, 13, and 15 WITH ALVEOLOPLASTY and gross debridement of teeth (N/A)  Patient Location: PACU  Anesthesia Type:General  Level of Consciousness: awake, oriented, sedated, patient cooperative and responds to stimulation  Airway & Oxygen Therapy: Patient Spontanous Breathing and Patient connected to face mask oxygen  Post-op Assessment: Report given to RN, Post -op Vital signs reviewed and stable, Patient moving all extremities and Patient moving all extremities X 4  Post vital signs: Reviewed and stable  Last Vitals:  Vitals:   06/20/16 0858 06/20/16 1130  BP: 126/75   Pulse: (!) 115   Resp:    Temp:  36.6 C    Last Pain:  Vitals:   06/20/16 0521  TempSrc: Oral  PainSc:       Patients Stated Pain Goal: 0 ([id number])  Complications: No apparent anesthesia complications

## 2016-06-20 NOTE — Progress Notes (Signed)
PROGRESS NOTE  Richard Simon [id number] DOB: 05-Dec-1929 DOA: 06/07/2016   PCP: Merrilee Seashore, MD   LOS: 13 days   Brief Narrative: 83 y.o. male with medical history significant of atrial fibrillation on Pradaxa, CAD, CABG, hyperlipidemia, hypertension, moderate to severe aortic stenosis is coming to the emergency department with complaints of on/off chest pressure, radiates to his back, improved by rest for the past week associated with dyspnea, orthopnea and fatigue.  He was found to be hyponatremic in the ED.  He was felt to be dehydrated, and received gentle IV hydration, which resulted in improvement in his sodium however he developed fluid overload quite rapidly.  2D echo later showed critical aortic stenosis and cardiology was consulted, patient was started on IV Lasix, his sodium has improved. CTS has determined that pt needs TAVR and to be done prior to discharge. Pt needs to have dental extraction, Dr. Orene Desanctis. Consulted and this is to be done today. Suspect that pt can undergo TAVR ~ 2 days post dental extraction.   Assessment & Plan:  Chest pain - 2D echo done on 2/22 showed an ejection fraction of 45-50%, with mildly reduced systolic function, with diffuse hypokinesis and critical aortic valve stenosis.  Valve area was calculated at 0.26 cm.  Prior to the echo done in April 2017 showed normal EF of 0000000, normal systolic function, with a valve area of 0.55 - per cardiology may need TAVR - no chest pain this AM  - appreciate cardiothoracic surgery assistance, plan for TAVR once dental extraction completed   Stage D severe and symptomatic AS with Acute on chronic combined diastolic and systolic CHF, NYHA class III - echo shows a severely calcified trileaflet aortic valve with severely restricted leaflet mobility - LV systolic function has decreased compared to last year - cath shows patent grafts and no significant ischemic territory - per vascular surgery, TAVR to be  considered but vascular surgery recommends that pt is clear from pulmonary stand point  - pt taken to OR for dental extraction and full mouth debridement of remaining dentition - PCCM cleared for surgery with close monitoring in SDU vs ICU post op  CAP - Completed 7 days therapy with ABX but opacities still seen on imaging studies  - will ask PCCM to review and provide pre op clearance for pt  - no fevers and WBC is now WNL  Hyponatremia - Patient was initially hypovolemic and his sodium improved with gentle hydration, however he developed fluid overload rather rapidly due to his critical aortic stenosis, with worsening hyponatremia.   - He required IV Lasix, with improvement in his respiratory status as well as improvement in his sodium levels. - Na was WNL on 3/5  Plaque-like irregularity along the posterior aspect of the urinary bladder wall - adjacent to a small filling defect which may have an attachment to the wall of the urinary bladder - findings are concerning for potential urothelial neoplasm, and follow-up nonemergent evaluation by Urology is suggested in the near future to better evaluate these findings - when pt ready for discharge will set up follow up appointment   Acute encephalopathy - Patient with intermittent confusion, this is likely in the setting of infectious process, as well as probable delirium related to inpatient stay - appears to be stable at baseline   CAD (coronary artery disease) - Continue home medications, cardiology following   Essential hypertension - reasonably stable for now   Dyslipidemia, goal LDL below 70 - Continue Crestor  Atrial fibrillation,  permanent ([place]) - CHA2DS2-VASc Score of at least 4. - Continue atenolol for rate control, on Heparin drip for now  - per cardiology, added digoxin on 3/4 - HR now in low 100's   DVT prophylaxis: SCDs, Heparin drip  Code Status: Full code Family Communication: no family at bedside    Disposition Plan: remains inpatient until surgery done   Consultants:   Cardiology  Cardiothoracic surgery   PCCM  Dentist Dr. Enrique Sack   Procedures:   2D echo Study Conclusions - Left ventricle: The cavity size was normal. Wall thickness was increased in a pattern of mild LVH. Systolic function was normal. The estimated ejection fraction was in the range of 55% to 60%. Wall motion was normal; there were no regional wall motion abnormalities. - Aortic valve: Valve mobility was restricted. There was severe stenosis. There was moderate regurgitation. Peak velocity (S): 451 cm/s. Mean gradient (S): 43 mm Hg. - Mitral valve: Moderately calcified annulus. There was mild regurgitation. - Left atrium: The atrium was moderately dilated. - Pulmonary arteries: Systolic pressure was mildly increased. PA peak pressure: 32 mm Hg (S).  Impressions: - Mean gradient 12mmHg from prior echocardiogram. Aortic stenosis has increased.  Antimicrobials:  Ceftriaxone 2/21 >> 2/28  Azithromycin 2/21 >> 2/28  Subjective: No events overnight.   Objective: Vitals:   06/19/16 2056 06/20/16 0500 06/20/16 0521 06/20/16 0858  BP: 114/79  105/72 126/75  Pulse: 89  73 (!) 115  Resp: (!) 28  19   Temp: 97.8 F (36.6 C)  98.3 F (36.8 C)   TempSrc: Oral  Oral   SpO2: 98%  97%   Weight:  74.2 kg (163 lb 9.6 oz)    Height:        Intake/Output Summary (Last 24 hours) at 06/20/16 1102 Last data filed at 06/20/16 0945  Gross per 24 hour  Intake             1195 ml  Output             1500 ml  Net             -305 ml   Filed Weights   06/18/16 0500 06/19/16 0500 06/20/16 0500  Weight: 77.8 kg (171 lb 9.6 oz) 77.9 kg (171 lb 11.2 oz) 74.2 kg (163 lb 9.6 oz)    Examination: Constitutional: NAD Vitals:   06/19/16 2056 06/20/16 0500 06/20/16 0521 06/20/16 0858  BP: 114/79  105/72 126/75  Pulse: 89  73 (!) 115  Resp: (!) 28  19   Temp: 97.8 F (36.6 C)  98.3 F (36.8 C)   TempSrc: Oral   Oral   SpO2: 98%  97%   Weight:  74.2 kg (163 lb 9.6 oz)    Height:       Eyes: PERRL, lids and conjunctivae normal Respiratory: diminished breath sounds at bases   Musculoskeletal: no clubbing / cyanosis.  Neurologic: CN 2-12 grossly intact. Strength 5/5 in all 4.  Psychiatric: Alert and oriented 3  Data Reviewed: I have personally reviewed following labs and imaging studies  CBC:  Recent Labs Lab 06/16/16 0437 06/17/16 0350 06/18/16 0342 06/19/16 0409 06/20/16 0258  WBC 12.9* 9.1 9.3 8.4 8.2  NEUTROABS 10.0* 6.3 6.4 6.0 5.4  HGB 10.8* 10.8* 11.5* 11.0* 11.1*  HCT 32.4* 32.0* 33.5* 33.3* 33.1*  MCV 95.6 94.7 94.1 94.6 94.3  PLT 349 363 367 357 [id number]   Basic Metabolic Panel:  Recent Labs Lab 06/16/16 0437 06/17/16 0350 06/18/16 0342  06/19/16 0409  NA 133* 134* 132* 135  K 3.8 3.8 3.8 4.0  CL 98* 98* 97* 101  CO2 26 27 24 29   GLUCOSE 106* 99 108* 104*  BUN 7 10 8  5*  CREATININE 0.67 0.73 0.70 0.71  CALCIUM 8.9 9.0 8.9 9.0    Radiology Studies: Dg Orthopantogram  Result Date: 06/16/2016 CLINICAL DATA:  Aortic stenosis. EXAM: ORTHOPANTOGRAM/PANORAMIC COMPARISON:  CT 01/03/2009. FINDINGS: Mild periapical lucency lucency is noted over the left lower most posterior remaining tooth. Subtle lucencies noted over the posterior most aspect of the left inferior mandibular ramus. Dental evaluation suggested. No other focal bony abnormalities identified. No evidence of fracture. IMPRESSION: Mild periapical lucency noted over the left lower most posterior remaining tooth. Subtle lucencies noted over the posterior aspect of the left inferior mandibular ramus. Dental evaluation suggested. Electronically Signed   By: Marcello Moores  Register   On: 06/16/2016 15:20   Ct Cardiac Morph/pulm Vein W/cm&w/o Ca Score  Addendum Date: 06/16/2016   ADDENDUM REPORT: 06/16/2016 09:50 EXAM: OVER-READ INTERPRETATION  CT CHEST The following report is an over-read performed by radiologist Dr. Rebekah [facility], [place] on 06/16/2016. This over-read does not include interpretation of cardiac or coronary anatomy or pathology. The cardiac CT interpretation by the cardiologist is attached. COMPARISON:  None. FINDINGS: Full description of extracardiac findings on separate dictation for contemporaneously obtained CTA of the chest, abdomen and pelvis 06/15/2016. IMPRESSION: See separate dictation for contemporaneously obtained CTA of the chest, abdomen and pelvis 06/15/2016 for full description of extracardiac findings. Electronically Signed   By: Vinnie Langton M.D.   On: 06/16/2016 09:50   Result Date: 06/16/2016 CLINICAL DATA:  83 year old male with severe aortic stenosis. EXAM: Cardiac TAVR CT TECHNIQUE: The patient was scanned on a Philips 256 scanner. A 120 kV retrospective scan was triggered in the descending thoracic aorta at 111 HU's. Gantry rotation speed was 270 msecs and collimation was .9 mm. 20 mg of iv Metoprolol and no nitro were given. The 3D data set was reconstructed in 5% intervals of the R-R cycle. Systolic and diastolic phases were analyzed on a dedicated work station using MPR, MIP and VRT modes. The patient received 80 cc of contrast. FINDINGS: Aortic Valve: Trileaflet, severely calcified and thickened with severely restricted leaflet opening and minimal calcifications extending into the LVOT. Aorta:  Normal size, no dissection.  Mild diffuse calcifications. Sinotubular Junction:  30 x 30 mm Ascending Thoracic Aorta:  33 x 32 mm Aortic Arch:  24 x 24 mm Descending Thoracic Aorta:  24 x 24 mm Sinus of Valsalva Measurements: Non-coronary:  37 mm Right -coronary:  33 mm Left -coronary:  34 mm Coronary Artery Height above Annulus: Left Main:  16 mm Right Coronary:  24 mm Virtual Basal Annulus Measurements: Maximum/Minimum Diameter:  32 x 22 mm Perimeter:  94 mm Area:  596 mm2 Optimum Fluoroscopic Angle for Delivery:  RAO 2 CRA 1 Dilated pulmonary artery measuring 32 x 29 mm No  thrombus in the left atrial appendage. Normal pulmonary vein drainage into the left atrium. No ASD/VSD seen. IMPRESSION: 1. Trileaflet, severely calcified and thickened with severely restricted leaflet opening and minimal calcifications extending into the LVOT. Annular measurements suitable for delivery of a 29 mm Edwards-SAPIEN 3 TAVR valve. 2. Optimum Fluoroscopic Angle for Delivery:  RAO 2 CRA 1 Ena Dawley Electronically Signed: By: Ena Dawley On: 06/15/2016 18:06   Ct Angio Chest/abd/pel For Dissection W And/or W/wo  Result Date: 06/16/2016 CLINICAL DATA:  83 year old male with history of severe aortic stenosis. Preprocedural study prior to potential transcatheter aortic valve replacement (TAVR). EXAM: CT ANGIOGRAPHY CHEST, ABDOMEN AND PELVIS TECHNIQUE: Multidetector CT imaging through the chest, abdomen and pelvis was performed using the standard protocol during bolus administration of intravenous contrast. Multiplanar reconstructed images and MIPs were obtained and reviewed to evaluate the vascular anatomy. CONTRAST:  80 mL of Isovue 370. COMPARISON:  No priors. FINDINGS: CTA CHEST FINDINGS Cardiovascular: Heart size is mildly enlarged with biatrial dilatation. There is no significant pericardial fluid, thickening or pericardial calcification. There is aortic atherosclerosis, as well as atherosclerosis of the great vessels of the mediastinum and the coronary arteries, including calcified atherosclerotic plaque in the left main, left anterior descending and left circumflex coronary arteries. Status post median sternotomy for CABG, including LIMA to the LAD. Mediastinum/Lymph Nodes: Multiple prominent borderline enlarged mediastinal and hilar lymph nodes are noted, likely reactive. Esophagus is unremarkable in appearance. No axillary lymphadenopathy. Lungs/Pleura: Moderate right and small left pleural effusions lie dependently. Throughout the lungs bilaterally there are relatively symmetric areas  of ground-glass attenuation and septal thickening which are most evident throughout the mid to upper lungs. Lower lungs appear relatively normal, with a few patchy areas of ground-glass attenuation. Musculoskeletal/Soft Tissues: Median sternotomy wires. There are no aggressive appearing lytic or blastic lesions noted in the visualized portions of the skeleton. CTA ABDOMEN AND PELVIS FINDINGS Hepatobiliary: Liver has a slightly shrunken appearance and nodular contour, suggestive of underlying cirrhosis. No cystic or solid hepatic lesions. No intra or extrahepatic biliary ductal dilatation. Gallbladder is normal in appearance. Pancreas: No pancreatic mass. No pancreatic ductal dilatation. No pancreatic or peripancreatic fluid or inflammatory changes. Spleen: Unremarkable. Adrenals/Urinary Tract: Bilateral kidneys and bilateral adrenal glands are unremarkable in appearance. No hydroureteronephrosis. Small filling defect in the urinary bladder measuring 0.6 x 1.3 x 1.6 cm (axial image 254 of series 401 and coronal image 108 of series 403), with potential stalk like attachment to the wall of the urinary bladder (coronal image 108 of series 403). There is also some plaque-like irregularity along the posterior aspect of the urinary bladder wall, best appreciated on sagittal image 124 of series 404. Stomach/Bowel: The appearance of the stomach is normal. There is no pathologic dilatation of small bowel or colon. Vascular/Lymphatic: Aortic atherosclerosis, with vascular findings and measurements pertinent to potential TAVR procedure, as detailed below. 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 Throughout the abdominal aorta there multiple ulcerated plaques. The largest of these plaques is best demonstrated in cross-section on axial image 200 of series 401 adjacent to the inferior mesenteric artery origin, where it appears as a  short segment dissection (however, this does not appear to be a true dissection). 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 The celiac axis, superior mesenteric artery and inferior mesenteric artery are all patent without definite hemodynamically significant stenosis. Two right-sided and one left-sided renal arteries are all patent without hemodynamically significant stenosis. No lymphadenopathy noted in the abdomen or pelvis. Reproductive: Prostate gland and seminal vesicles are unremarkable in appearance. Other: No significant volume of ascites.  No pneumoperitoneum. Musculoskeletal: There are no aggressive appearing lytic or blastic lesions noted in the visualized portions of the skeleton. VASCULAR MEASUREMENTS PERTINENT TO TAVR: AORTA: Minimal Aortic Diameter -  19 x 15 mm Severity of Aortic Calcification -  moderate to severe RIGHT PELVIS: Right Common Iliac Artery - Minimal Diameter - 11.2 x 8.3 mm Tortuosity - severe Calcification - moderate Right External Iliac Artery - Minimal Diameter - 8.7 x 9.1 mm Tortuosity - severe Calcification - mild Right Common Femoral Artery - Focal aneurysmal dilatation up to 17 mm with large ulcerated plaque. Minimal Diameter - 8.5 x 6.3 mm Tortuosity - mild Calcification - moderate LEFT PELVIS: Left Common Iliac Artery - Minimal Diameter - 8.1 x 7.8 mm Tortuosity - severe Calcification - moderate Left External Iliac Artery - Minimal Diameter - 9.1 x 8.5 mm Tortuosity - severe Calcification - mild Left Common Femoral Artery - Minimal Diameter - 8.7 x 7.6 mm Tortuosity - mild Calcification - moderate Review of the MIP images confirms the above findings. IMPRESSION: 1. Vascular findings and measurements pertinent to potential TAVR procedure, as detailed above. From a size criteria, the patient has suitable pelvic arterial access bilaterally.  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 In addition, there is a large ulcerated plaque in the infrarenal abdominal aorta (adjacent to the origin of the inferior mesenteric artery), best appreciated on axial image 200 of series 401, which in the axial plane has an appearance of a very short segment dissection. Caution should be taken when traversing this region with a catheter. 2. Severe thickening calcification of the aortic valve, compatible with the reported clinical history of severe aortic stenosis. 3. Extensive ground-glass attenuation and septal thickening throughout the mid to upper lungs bilaterally, strongly favored to be related to an acute infection. There may also be a background of very mild interstitial pulmonary edema, best appreciated in the lower lungs. 4. Moderate right and small left pleural effusions lying dependently. 5. Plaque-like irregularity along the posterior aspect of the urinary bladder wall, adjacent to a small filling defect which may have an attachment to the wall of the urinary bladder. These findings are concerning for potential urothelial neoplasm, and follow-up nonemergent evaluation by Urology is suggested in the near future to better evaluate these findings. 6. Morphologic changes in the liver suggestive of early cirrhosis, as above. 7. Additional incidental findings, as above. Electronically Signed   By: Vinnie Langton M.D.   On: 06/16/2016 09:01    Scheduled Meds: . aminocaproic acid  10 mL Oral Q1H  . [MAR Hold] aspirin EC  81 mg Oral Daily  . [MAR Hold] atenolol  75 mg Oral Daily  . [MAR Hold] diltiazem  240 mg Oral Daily  . [MAR Hold] docusate sodium  100 mg Oral BID  . [MAR Hold] rosuvastatin  10 mg Oral Daily  . [MAR Hold] sodium chloride flush  3 mL  Intravenous Q12H  . [MAR Hold] sodium chloride flush  3 mL Intravenous Q12H   Continuous Infusions: . lactated ringers 50 mL/hr at 06/20/16 [id number]   Faye Ramsay, MD  [facility] Pager [phone]  If 7PM-7AM, please contact night-coverage [email or web address] Password [email or web address] 06/20/2016, 11:02 AM

## 2016-06-20 NOTE — Anesthesia Postprocedure Evaluation (Signed)
Anesthesia Post Note  Patient: Freddi Starr  Procedure(s) Performed: Procedure(s) (LRB): EXTRACTION of #7, 12, 13, and 15 WITH ALVEOLOPLASTY and gross debridement of teeth (N/A)  Patient location during evaluation: PACU Anesthesia Type: General Level of consciousness: awake and alert Pain management: pain level controlled Vital Signs Assessment: post-procedure vital signs reviewed and stable Respiratory status: spontaneous breathing, nonlabored ventilation, respiratory function stable and patient connected to nasal cannula oxygen Cardiovascular status: blood pressure returned to baseline and stable Postop Assessment: no signs of nausea or vomiting Anesthetic complications: no       Last Vitals:  Vitals:   06/20/16 1146 06/20/16 1215  BP: 114/65 105/73  Pulse: (!) 57 89  Resp: 16 16  Temp:  36.7 C    Last Pain:  Vitals:   06/20/16 0521  TempSrc: Oral  PainSc:                  Rocky Rishel,W. EDMOND

## 2016-06-20 NOTE — Op Note (Signed)
OPERATIVE REPORT  Patient:            Richard Simon Date of Birth:  05-27-1929 MRN:                [id number]   DATE OF PROCEDURE:  06/20/2016  PREOPERATIVE DIAGNOSES: 1. Aortic stenosis 2. Pre-heart valve surgery dental protocol 3. Multiple retained root segments 4. Dental caries 5. Chronic periodontitis 6. Accretions  POSTOPERATIVE DIAGNOSES: 1. Aortic stenosis 2. Pre-heart valve surgery dental protocol 3. Multiple retained root segments 4. Dental caries 5. Chronic periodontitis 6. Accretions  OPERATIONS: 1. Multiple extraction of tooth numbers [id number] with alveoloplasty 2. Full mouth debridement of remaining dentition   SURGEON: Lenn Cal, DDS  ASSISTANT: Camie Patience, (dental assistant)  ANESTHESIA: General anesthesia via oral endotracheal tube.  MEDICATIONS: 1. Ancef 2 g IV prior to invasive dental procedures. 2. Local anesthesia with a total utilization of 4 carpules each containing 34 mg of lidocaine with 0.017 mg of epinephrine   SPECIMENS: There are 4 teeth that were discarded.  DRAINS: None  CULTURES: None  COMPLICATIONS: None   ESTIMATED BLOOD LOSS: 25 mLs.  INTRAVENOUS FLUIDS: 800 mLs of Lactated ringers solution.  INDICATIONS: The patient was recently diagnosed with severe aortic stenosis.  A dental consultation was then requested to evaluate poor dentition.  The patient was examined and treatment planned for extraction of indicated teeth with alveoloplasty and full mouth debridement of remaining teeth in the OR with general anesthesia. This treatment plan was formulated to decrease the risks and complications associated with dental infection from affecting the patient's systemic health and the anticipated heart valve surgery.  OPERATIVE FINDINGS: Patient was examined operating room number 15.  The teeth were identified for extraction. Tooth numbers 7 and 12 were retained root segments and upon further examination tooth #'s 13 and 15 were  noted to have caries with probable extension into the pulp.  These teeth were then added to the treatment planned to be extracted at this time.  Additional dental caries and flexure lesions were noted but these teeth did not require extraction at this time. The patient was noted be affected by chronic periodontitis, accretions , retained root segments, dental caries, and multiple flexure lesions.   DESCRIPTION OF PROCEDURE: Patient was brought to the main operating room number 15. Patient was then placed in the supine position on the operating table. Genreal anesthesia was then induced per the anesthesia team. The patient was then prepped and draped in the usual manner for dental medicine procedure. A timeout was performed. The patient was identified and procedures were verified. A throat pack was placed at this time. The oral cavity was then thoroughly examined with the findings noted above. The patient was then ready for dental medicine procedure as follows:  Local anesthesia was then administered sequentially with a total utilization of 4 carpules each containing 34 mg of lidocaine with 0.017 mg of epinephrine.  The Maxillary left and right quadrants were first approached. Anesthesia was then delivered utilizing infiltration with lidocaine with epinephrine. A #15 blade incision was then made around tooth #7.  A sulcular flap was reflected. Tooth #7 was subluxated and removed with a 150 forceps without complications. The socket was curetted and compressed. The socket was irrigated with sterile saline. A piece of Surgicel was placed in the extraction socket. The surgical site was closed with 3-O chromic gut with a figure of eight suture technique.    Tooth #'s 12,13 and 15 were approached. The blade  incision was made from the distal of #15 extended to the mesial of #12.   A  surgical flap was then carefully reflected. Tooth numbers 12, 13, 15 were then subluxated with a series of straight elevators. Tooth  #12 was removed with a 150 forceps without complications. Extensive dental caries was noted on the mesial #13 with probable pulpal involvement. Tooth #13 was then removed with a 150 forceps without complications. Tooth #15 was then approached and subluxated with a series of straight elevators and removed with a 53L forceps without complications. Sockets were curetted and compressed appropriately. The sockets were irrigated with copious amounts of sterile saline. Surgicel was placed in its extraction sockets appropriately. Extraction site area #15 was then closed utilizing 3-0 chromic gut material in a continuous interrupted suture technique from the distal of #15 and extended the tooth #14.  The surgical site was then further closed from the mesial of #14 extended to the distal of #11 utilizing 3-0 chromic material in a continuous interrupted suture technique 1. 2 individual interrupted sutures are then placed to further closed surgical site utilizing 3-0 chromic gut material.   At this point in time, the remaining dentition was approached. A full mouth debridement was then performed utilizing the sonic scaler. A series of hand curettes were utilized to further remove accretions. A sonic scaler was then again used to refine the removal of accretions.  At this point time, the entire mouth was irrigated with copious amounts of sterile saline. The patient was examined for complications, seeing none, the dental medicine procedure was deemed to be complete. The throat pack was removed at this time. An oral airway was then placed at the request of the anesthesia team. A series of 4 x 4 gauze moistened with Amicar 5% oral rinse were placed in the mouth to aid hemostasis. The patient was then handed over to the anesthesia team for final disposition. After an appropriate amount of time, the patient was extubated and taken to the postanesthsia care unit in good condition. All counts were correct for the dental medicine  procedure. Patient is to continue the Amicar 5% rinses postoperatively. Patient is rinse with 10 ML's every hour for the next 10 hours in a swish and spit manner. The Heparin therapy is to be discontinued until Wednesday morning at 7:00 am per discussion with Dr. Burt Knack.  Heparin is to be restarted at previous dose with no bolus. The patient is to proceed with heart valve surgery at the discretion of Dr. Burt Knack and Dr. Cyndia Bent.   Lenn Cal, DDS.

## 2016-06-20 NOTE — Progress Notes (Signed)
PRE-OPERATIVE NOTE:  06/20/2016 Richard Simon [id number]  VITALS: BP 105/72 (BP Location: Right Arm)   Pulse 73   Temp 98.3 F (36.8 C) (Oral)   Resp 19   Ht 5\' 9"  (1.753 m)   Wt 163 lb 9.6 oz (74.2 kg)   SpO2 97%   BMI 24.16 kg/m   Lab Results  Component Value Date   WBC 8.2 06/20/2016   HGB 11.1 (L) 06/20/2016   HCT 33.1 (L) 06/20/2016   MCV 94.3 06/20/2016   PLT 339 06/20/2016   BMET    Component Value Date/Time   NA 135 06/19/2016 0409   K 4.0 06/19/2016 0409   CL 101 06/19/2016 0409   CO2 29 06/19/2016 0409   GLUCOSE 104 (H) 06/19/2016 0409   BUN 5 (L) 06/19/2016 0409   CREATININE 0.71 06/19/2016 0409   CALCIUM 9.0 06/19/2016 0409   GFRNONAA >60 06/19/2016 0409   GFRAA >60 06/19/2016 0409    Lab Results  Component Value Date   INR 1.29 06/11/2016   INR 1.05 12/27/2010   INR 0.99 08/25/2009   No results found for: PTT   Richard Simon presents for extraction of indicated teeth with alveoloplasty and dental cleaning of teeth in the operating room. I will proceed with extraction of tooth #'s 7 and 12 and will consider extraction of tooth #15 if needed. The patient and son are agreeable with this plan of care.  SUBJECTIVE: The patient denies any acute medical or dental changes and agrees to proceed with treatment as planned.  EXAM: No sign of acute dental changes.  ASSESSMENT: Patient is affected by retained root segments, dental caries, chronic periodontitis, and accretions.  PLAN: Patient agrees to proceed with treatment as planned in the operating room as previously discussed and accepts the risks, benefits, and complications of the proposed treatment. Patient is aware of the risk for bleeding, bruising, swelling, infection, pain, nerve damage, soft tissue damage, damage to adjacent teeth, sinus involvement, root tip fracture, mandible fracture, and the risks of complications associated with the anesthesia. Patient also is aware of the potential for other  complications not mentioned above up to and including death.   Lenn Cal, DDS

## 2016-06-20 NOTE — Progress Notes (Signed)
Progress Note  Patient Name: Richard Simon Date of Encounter: 06/20/2016  Primary Cardiologist: Dr Ellyn Hack  Subjective   Doing ok after dental extraction this morning. No complaints.   Inpatient Medications    Scheduled Meds: . aminocaproic acid  10 mL Oral Q1H  . aspirin EC  81 mg Oral Daily  . atenolol  75 mg Oral Daily  . bacitracin  1 application Topical TID  . diltiazem  240 mg Oral Daily  . docusate sodium  100 mg Oral BID  . rosuvastatin  10 mg Oral Daily  . sodium chloride flush  3 mL Intravenous Q12H  . sodium chloride flush  3 mL Intravenous Q12H   Continuous Infusions: . [START ON 06/21/2016] heparin    . lactated ringers 50 mL/hr at 06/20/16 0853  . lactated ringers 10 mL/hr at 06/20/16 1412   PRN Meds: sodium chloride, acetaminophen, ALPRAZolam, guaiFENesin-dextromethorphan, HYDROcodone-acetaminophen, levalbuterol, magnesium hydroxide, morphine injection, ondansetron **OR** ondansetron (ZOFRAN) IV, sodium chloride flush, witch hazel-glycerin   Vital Signs    Vitals:   06/20/16 0858 06/20/16 1130 06/20/16 1146 06/20/16 1215  BP: 126/75 112/66 114/65 105/73  Pulse: (!) 115 98 (!) 57 89  Resp:  17 16 16   Temp:  97.9 F (36.6 C)  98 F (36.7 C)  TempSrc:      SpO2:  99% 100% 98%  Weight:      Height:        Intake/Output Summary (Last 24 hours) at 06/20/16 1753 Last data filed at 06/20/16 1600  Gross per 24 hour  Intake             1473 ml  Output             1950 ml  Net             -477 ml   Filed Weights   06/18/16 0500 06/19/16 0500 06/20/16 0500  Weight: 171 lb 9.6 oz (77.8 kg) 171 lb 11.2 oz (77.9 kg) 163 lb 9.6 oz (74.2 kg)    Physical Exam  Pt awake, alert, in NAD  Labs    Chemistry Recent Labs Lab 06/17/16 0350 06/18/16 0342 06/19/16 0409  NA 134* 132* 135  K 3.8 3.8 4.0  CL 98* 97* 101  CO2 27 24 29   GLUCOSE 99 108* 104*  BUN 10 8 5*  CREATININE 0.73 0.70 0.71  CALCIUM 9.0 8.9 9.0  GFRNONAA >60 >60 >60  GFRAA >60 >60 >60    ANIONGAP 9 11 5      Hematology Recent Labs Lab 06/18/16 0342 06/19/16 0409 06/20/16 0258  WBC 9.3 8.4 8.2  RBC 3.56* 3.52* 3.51*  HGB 11.5* 11.0* 11.1*  HCT 33.5* 33.3* 33.1*  MCV 94.1 94.6 94.3  MCH 32.3 31.3 31.6  MCHC 34.3 33.0 33.5  RDW 12.6 12.5 12.7  PLT 367 357 339    Cardiac EnzymesNo results for input(s): TROPONINI in the last 168 hours. No results for input(s): TROPIPOC in the last 168 hours.   BNPNo results for input(s): BNP, PROBNP in the last 168 hours.   DDimer No results for input(s): DDIMER in the last 168 hours.   Radiology    Dg Chest Port 1 View  Result Date: 06/19/2016 CLINICAL DATA:  Shortness of breath and congestion EXAM: PORTABLE CHEST 1 VIEW COMPARISON:  06/09/2016, 06/15/2016 FINDINGS: Cardiac shadow is mildly enlarged but stable. Postsurgical changes are again seen. Diffuse changes in the upper lobes are noted bilaterally similar to that seen on recent chest  x-ray and recent CT examination. No bony abnormality is noted. IMPRESSION: Stable changes in the upper lobes bilaterally. No new focal abnormality is seen. Electronically Signed   By: Inez Catalina M.D.   On: 06/19/2016 14:07     Patient Profile     83 y.o. male with critical aortic stenosis and heart failure  Assessment & Plan    1. Acute on chronic combined systolic and diastolic heart failure: multifactorial, critical AS primary issue. Continue current meds - reviewed today.  2. Permanent atrial fibrillation: Heart rate is better controlled. He is on a combination of atenolol and diltiazem. IV heparin used for bridging and will plan to resume oral anticoagulation after TAVR. Restart heparin tomorrow morning after dental extraction today.  3. Critical aortic stenosis: The patient has poor dentition and underwentdental extraction this morning by Dr. Lawana Chambers. Scheduled for TAVR this Thursday. Plan has been reviewed with multidisciplinary team.   Signed, Sherren Mocha, MD  06/20/2016,  5:53 PM

## 2016-06-21 ENCOUNTER — Encounter (HOSPITAL_COMMUNITY): Payer: Self-pay | Admitting: Dentistry

## 2016-06-21 DIAGNOSIS — R0602 Shortness of breath: Secondary | ICD-10-CM

## 2016-06-21 DIAGNOSIS — K029 Dental caries, unspecified: Secondary | ICD-10-CM

## 2016-06-21 DIAGNOSIS — K08199 Complete loss of teeth due to other specified cause, unspecified class: Secondary | ICD-10-CM

## 2016-06-21 DIAGNOSIS — K0530 Chronic periodontitis, unspecified: Secondary | ICD-10-CM

## 2016-06-21 DIAGNOSIS — I350 Nonrheumatic aortic (valve) stenosis: Secondary | ICD-10-CM

## 2016-06-21 DIAGNOSIS — K083 Retained dental root: Secondary | ICD-10-CM

## 2016-06-21 DIAGNOSIS — K036 Deposits [accretions] on teeth: Secondary | ICD-10-CM

## 2016-06-21 DIAGNOSIS — G9340 Encephalopathy, unspecified: Secondary | ICD-10-CM

## 2016-06-21 LAB — CBC WITH DIFFERENTIAL/PLATELET
Basophils Absolute: 0 10*3/uL (ref 0.0–0.1)
Basophils Relative: 0 %
EOS PCT: 2 %
Eosinophils Absolute: 0.2 10*3/uL (ref 0.0–0.7)
HEMATOCRIT: 34.5 % — AB (ref 39.0–52.0)
HEMOGLOBIN: 11.5 g/dL — AB (ref 13.0–17.0)
LYMPHS ABS: 0.9 10*3/uL (ref 0.7–4.0)
LYMPHS PCT: 10 %
MCH: 31.7 pg (ref 26.0–34.0)
MCHC: 33.3 g/dL (ref 30.0–36.0)
MCV: 95 fL (ref 78.0–100.0)
Monocytes Absolute: 0.9 10*3/uL (ref 0.1–1.0)
Monocytes Relative: 9 %
NEUTROS ABS: 7.3 10*3/uL (ref 1.7–7.7)
Neutrophils Relative %: 79 %
Platelets: 250 10*3/uL (ref 150–400)
RBC: 3.63 MIL/uL — AB (ref 4.22–5.81)
RDW: 12.6 % (ref 11.5–15.5)
WBC: 9.3 10*3/uL (ref 4.0–10.5)

## 2016-06-21 LAB — TYPE AND SCREEN
ABO/RH(D): A POS
Antibody Screen: NEGATIVE

## 2016-06-21 LAB — HEPARIN LEVEL (UNFRACTIONATED)
Heparin Unfractionated: <0.1 IU/mL — ABNORMAL LOW (ref 0.30–0.70)
Heparin Unfractionated: 0.3 IU/mL (ref 0.30–0.70)

## 2016-06-21 LAB — BASIC METABOLIC PANEL
ANION GAP: 9 (ref 5–15)
BUN: 10 mg/dL (ref 6–20)
CO2: 26 mmol/L (ref 22–32)
Calcium: 9.2 mg/dL (ref 8.9–10.3)
Chloride: 99 mmol/L — ABNORMAL LOW (ref 101–111)
Creatinine, Ser: 0.9 mg/dL (ref 0.61–1.24)
GFR calc Af Amer: >60 mL/min (ref >60)
GFR calc non Af Amer: >60 mL/min (ref >60)
GLUCOSE: 98 mg/dL (ref 65–99)
POTASSIUM: 4.2 mmol/L (ref 3.5–5.1)
Sodium: 134 mmol/L — ABNORMAL LOW (ref 135–145)

## 2016-06-21 MED ORDER — TEMAZEPAM 15 MG PO CAPS
15.0000 mg | ORAL_CAPSULE | Freq: Once | ORAL | Status: AC | PRN
  Administered 2016-06-22: 15 mg via ORAL
  Filled 2016-06-21: qty 1

## 2016-06-21 MED ORDER — SODIUM CHLORIDE 0.9 % IV SOLN
30.0000 ug/min | INTRAVENOUS | Status: DC
  Filled 2016-06-21 (×2): qty 2

## 2016-06-21 MED ORDER — SODIUM CHLORIDE 0.9 % IV SOLN
INTRAVENOUS | Status: DC
  Filled 2016-06-21 (×2): qty 30

## 2016-06-21 MED ORDER — NITROGLYCERIN IN D5W 200-5 MCG/ML-% IV SOLN
2.0000 ug/min | INTRAVENOUS | Status: DC
  Filled 2016-06-21 (×2): qty 250

## 2016-06-21 MED ORDER — INSULIN REGULAR HUMAN 100 UNIT/ML IJ SOLN
INTRAMUSCULAR | Status: DC
  Filled 2016-06-21 (×2): qty 2.5

## 2016-06-21 MED ORDER — NOREPINEPHRINE BITARTRATE 1 MG/ML IV SOLN
0.0000 ug/min | INTRAVENOUS | Status: DC
  Filled 2016-06-21 (×2): qty 4

## 2016-06-21 MED ORDER — ENSURE ENLIVE PO LIQD
237.0000 mL | Freq: Three times a day (TID) | ORAL | Status: DC
  Administered 2016-06-21 – 2016-06-27 (×12): 237 mL via ORAL

## 2016-06-21 MED ORDER — CHLORHEXIDINE GLUCONATE 0.12 % MT SOLN
15.0000 mL | Freq: Two times a day (BID) | OROMUCOSAL | Status: DC
  Administered 2016-06-21 (×2): 15 mL via OROMUCOSAL
  Filled 2016-06-21 (×2): qty 15

## 2016-06-21 MED ORDER — DOPAMINE-DEXTROSE 3.2-5 MG/ML-% IV SOLN
0.0000 ug/kg/min | INTRAVENOUS | Status: DC
  Filled 2016-06-21 (×2): qty 250

## 2016-06-21 MED ORDER — METOPROLOL TARTRATE 50 MG PO TABS
50.0000 mg | ORAL_TABLET | Freq: Two times a day (BID) | ORAL | Status: DC
  Administered 2016-06-21 – 2016-06-23 (×4): 50 mg via ORAL
  Filled 2016-06-21 (×4): qty 1

## 2016-06-21 MED ORDER — DEXTROSE 5 % IV SOLN
0.0000 ug/min | INTRAVENOUS | Status: DC
Start: 2016-06-22 — End: 2016-06-22
  Filled 2016-06-21 (×2): qty 4

## 2016-06-21 MED ORDER — DEXMEDETOMIDINE HCL IN NACL 400 MCG/100ML IV SOLN
0.1000 ug/kg/h | INTRAVENOUS | Status: DC
  Filled 2016-06-21 (×2): qty 100

## 2016-06-21 MED ORDER — CHLORHEXIDINE GLUCONATE 0.12 % MT SOLN
15.0000 mL | Freq: Once | OROMUCOSAL | Status: AC
  Administered 2016-06-22: 15 mL via OROMUCOSAL
  Filled 2016-06-21: qty 15

## 2016-06-21 MED ORDER — MAGNESIUM SULFATE 50 % IJ SOLN
40.0000 meq | INTRAMUSCULAR | Status: DC
  Filled 2016-06-21 (×2): qty 10

## 2016-06-21 MED ORDER — DEXTROSE 5 % IV SOLN
1.5000 g | INTRAVENOUS | Status: AC
  Administered 2016-06-22: 1.5 g via INTRAVENOUS
  Filled 2016-06-21 (×3): qty 1.5

## 2016-06-21 MED ORDER — VANCOMYCIN HCL 10 G IV SOLR
1250.0000 mg | INTRAVENOUS | Status: AC
  Administered 2016-06-22: 1250 mg via INTRAVENOUS
  Filled 2016-06-21 (×2): qty 1250

## 2016-06-21 MED ORDER — BISACODYL 5 MG PO TBEC
5.0000 mg | DELAYED_RELEASE_TABLET | Freq: Once | ORAL | Status: AC
  Administered 2016-06-21: 5 mg via ORAL
  Filled 2016-06-21: qty 1

## 2016-06-21 MED ORDER — POTASSIUM CHLORIDE 2 MEQ/ML IV SOLN
80.0000 meq | INTRAVENOUS | Status: DC
  Filled 2016-06-21 (×2): qty 40

## 2016-06-21 MED ORDER — CHLORHEXIDINE GLUCONATE CLOTH 2 % EX PADS
6.0000 | MEDICATED_PAD | Freq: Once | CUTANEOUS | Status: AC
  Administered 2016-06-22: 6 via TOPICAL

## 2016-06-21 MED ORDER — HYDROCORTISONE 1 % EX CREA
TOPICAL_CREAM | Freq: Three times a day (TID) | CUTANEOUS | Status: DC
  Administered 2016-06-21 – 2016-06-25 (×13): via TOPICAL
  Administered 2016-06-26: 1 via TOPICAL
  Administered 2016-06-26: 22:00:00 via TOPICAL
  Administered 2016-06-26 – 2016-06-27 (×2): 1 via TOPICAL
  Filled 2016-06-21 (×2): qty 28

## 2016-06-21 MED ORDER — CHLORHEXIDINE GLUCONATE CLOTH 2 % EX PADS
6.0000 | MEDICATED_PAD | Freq: Once | CUTANEOUS | Status: AC
  Administered 2016-06-21: 6 via TOPICAL

## 2016-06-21 NOTE — Care Management Important Message (Signed)
Important Message  Patient Details  Name: Richard Simon MRN: [id number] Date of Birth: 10-Dec-1929   Medicare Important Message Given:  Yes    Richard Simon 06/21/2016, 2:11 PM

## 2016-06-21 NOTE — Progress Notes (Signed)
CT surgery :  I called patient's son Ronalee Belts. He was sitting in an airplane getting ready to takeoff to come to [place] and could only talk briefly. I told him that either Dr. Burt Knack or myself would talk with him and his sister when they are here in the morning. He is fine with that.

## 2016-06-21 NOTE — Progress Notes (Signed)
ANTICOAGULATION CONSULT NOTE - Follow Up Consult  Pharmacy Consult for Heparin (Pradaxa on hold) Indication: atrial fibrillation  Assessment: 67 yoM on heparin for afib while Pradaxa on hold, in anticipation of TAVR.  Heparin restarted without bolus earlier today now s/p dental extraction. Heparin level at low end of therapeutic range at 5 hours post initiation of infusion. Suspect this will continue to increase at patient has been therapeutic on the same rate previously. No bleeding or infusion related issues reported.  Goal of Therapy:  Heparin level 0.3-0.7 units/ml Monitor platelets by anticoagulation protocol: Yes   Plan:  -Continue heparin at 1500 units/hr -Monitor heparin level daily, CBC, s/sx bleeding    Allergies  Allergen Reactions  . No Known Allergies    Patient Measurements: Height: 5\' 9"  (175.3 cm) Weight: 166 lb 4.8 oz (75.4 kg) IBW/kg (Calculated) : 70.7  Vital Signs: Temp: 98.5 F (36.9 C) (03/07 1456) Temp Source: Oral (03/07 1456) BP: 100/59 (03/07 1456) Pulse Rate: 89 (03/07 1456)  Labs:  Recent Labs  06/19/16 0409 06/20/16 0258 06/21/16 0551 06/21/16 1544  HGB 11.0* 11.1* 11.5*  --   HCT 33.3* 33.1* 34.5*  --   PLT 357 339 250  --   HEPARINUNFRC 0.43 0.55 <0.10* 0.30  CREATININE 0.71  --  0.90  --    Estimated Creatinine Clearance: 61.1 mL/min (by C-G formula based on SCr of 0.9 mg/dL).  Georga Bora, PharmD Clinical Pharmacist Pager: [phone] 06/21/2016 5:47 PM

## 2016-06-21 NOTE — Plan of Care (Signed)
Problem: Safety: Goal: Ability to remain free from injury will improve Outcome: Progressing Pt is more alert and cooperative, has not been attempting OOB without calling for assistance. Pt moved to camera room. Will cont current interventions.

## 2016-06-21 NOTE — Progress Notes (Signed)
I have called the patient son to obtain the signature of the consents. Awaiting response.  Ferdinand Lango, RN

## 2016-06-21 NOTE — Progress Notes (Signed)
PROGRESS NOTE    Richard Simon  ACZ:[id number] DOB: 26-Nov-1929 DOA: 06/07/2016 PCP: Merrilee Seashore, MD   Chief Complaint  Patient presents with  . Chest Pain  . Shortness of Breath    Brief Narrative:  83 y.o.malewith medical history significant of atrial fibrillation on Pradaxa, CAD, CABG, hyperlipidemia, hypertension, moderate to severe aortic stenosis is coming to the emergency department with complaints of on/off chest pressure, radiates to his back, improved by rest for the past week associated with dyspnea, orthopnea and fatigue.  He was found to be hyponatremic in the ED.  He was felt to be dehydrated, and received gentle IV hydration, which resulted in improvement in his sodium however he developed fluid overload quite rapidly.  2D echo later showed critical aortic stenosis and cardiology was consulted, patient was started on IV Lasix, his sodium has improved. CTS has determined that pt needs TAVR and to be done prior to discharge. Pt needs to have dental extraction, Dr. Orene Desanctis. Consulted and this is to be done today. Suspect that pt can undergo TAVR ~ 2 days post dental extraction.  Assessment & Plan   Chest pain -Echocardiogram done on 2/22 showed an ejection fraction of 45-50%, with mildly reduced systolic function, with diffuse hypokinesis and critical aortic valve stenosis.  Valve area was calculated at 0.26 cm.  Prior to the echo done in April 2017 showed normal EF of 10-93%, normal systolic function, with a valve area of 0.55 -per cardiology may need TAVR -no chest pain this AM  -appreciate cardiothoracic surgery assistance, plan for TAVR once dental extraction completed  -S/p dental extractions on 3/6  Stage D severe and symptomatic AS with Acute on chronic combined diastolic and systolic CHF, NYHA class III -echo shows a severely calcified trileaflet aortic valve with severely restricted leaflet mobility -LV systolic function has decreased compared to last year -cath  shows patent grafts and no significant ischemic territory -per vascular surgery, TAVR to be considered but vascular surgery recommends that pt is clear from pulmonary stand point  -pt taken to OR for dental extraction and full mouth debridement of remaining dentition -PCCM cleared for surgery with close monitoring in SDU vs ICU post op  CAP -Completed 7 days therapy with ABX but opacities still seen on imaging studies  -will ask PCCM to review and provide pre op clearance for pt  -no fevers and WBC is now WNL  Hyponatremia -Patient was initially hypovolemic and his sodium improved with gentle hydration, however he developed fluid overload rather rapidly due to his critical aortic stenosis, with worsening hyponatremia.   -He required IV Lasix, with improvement in his respiratory status as well as improvement in his sodium levels. -Sodium 134 today (was 119 upon admission) -Appears to have chronic hyponatremia (125-128 in 2010)  Plaque-like irregularity along the posterior aspect of the urinary bladder wall -adjacent to a small filling defect which may have an attachment to the wall of the urinary bladder -findings are concerning for potential urothelial neoplasm, and follow-up nonemergent evaluation by Urology is suggested in the near future to better evaluate these findings -when pt ready for discharge will set up follow up appointment   Acute encephalopathy -Patient with intermittent confusion, this is likely in the setting of infectious process, as well as probable delirium related to inpatient stay -appears to be stable at baseline   CAD (coronary artery disease) -Continue home medications, cardiology following   Essential hypertension -reasonably stable for now   Dyslipidemia, goal LDL below 70 -Continue Crestor  Atrial fibrillation, permanent (HCC) -CHA2DS2-VASc Score of at least 4. -Continue atenolol for rate control, on Heparin drip for now  -per cardiology, added  digoxin on 3/4 -HR now in low 100's   DVT Prophylaxis  SCDs, Heparin   Code Status: Full  Family Communication: None at bedside  Disposition Plan: Admitted.  Possibly surgery 3/8.   Consultants Cardiology Cardiothoracic surgery PCCM  Dentistry, Dr. Enrique Sack  Procedures  Echocardiogram Multiple tooth extractions (9,62,83,66) with alveoloplasty, full mouth debridement of remaining dentition   Antibiotics   Anti-infectives    Start     Dose/Rate Route Frequency Ordered Stop   06/20/16 0600  ceFAZolin (ANCEF) IVPB 2g/100 mL premix     2 g 200 mL/hr over 30 Minutes Intravenous  Once 06/19/16 1604 06/20/16 0635   06/20/16 0400  vancomycin (VANCOCIN) 1,250 mg in sodium chloride 0.9 % 250 mL IVPB  Status:  Discontinued     1,250 mg 166.7 mL/hr over 90 Minutes Intravenous To Surgery 06/19/16 1047 06/19/16 1522   06/20/16 0400  cefUROXime (ZINACEF) 1.5 g in dextrose 5 % 50 mL IVPB  Status:  Discontinued     1.5 g 100 mL/hr over 30 Minutes Intravenous To Surgery 06/19/16 1047 06/19/16 1522   06/08/16 0500  azithromycin (ZITHROMAX) 500 mg in dextrose 5 % 250 mL IVPB     500 mg 250 mL/hr over 60 Minutes Intravenous Every 24 hours 06/07/16 0240 06/14/16 0611   06/08/16 0300  cefTRIAXone (ROCEPHIN) 1 g in dextrose 5 % 50 mL IVPB     1 g 100 mL/hr over 30 Minutes Intravenous Every 24 hours 06/07/16 0240 06/14/16 0408   06/07/16 0215  cefTRIAXone (ROCEPHIN) 1 g in dextrose 5 % 50 mL IVPB     1 g 100 mL/hr over 30 Minutes Intravenous  Once 06/07/16 0212 06/07/16 0330   06/07/16 0215  azithromycin (ZITHROMAX) 500 mg in dextrose 5 % 250 mL IVPB     500 mg 250 mL/hr over 60 Minutes Intravenous  Once 06/07/16 2947 06/07/16 0601      Subjective:   Richard Simon seen and examined today. Patient complains of dizziness today. Denies chest pain, shortness of breath, abdominal pain, N/V/D/C. Upset that his surgery is not happening today.   Objective:   Vitals:   06/20/16 1215 06/20/16 2229  06/21/16 0500 06/21/16 1024  BP: 105/73 118/75 115/70 107/70  Pulse: 89 (!) 110 (!) 108 (!) 114  Resp: 16 15 (!) 29   Temp: 98 F (36.7 C) 98.4 F (36.9 C) 98 F (36.7 C)   TempSrc:  Axillary Oral   SpO2: 98% 94% 92%   Weight:   75.4 kg (166 lb 4.8 oz)   Height:        Intake/Output Summary (Last 24 hours) at 06/21/16 1241 Last data filed at 06/21/16 0730  Gross per 24 hour  Intake              717 ml  Output                0 ml  Net              717 ml   Filed Weights   06/19/16 0500 06/20/16 0500 06/21/16 0500  Weight: 77.9 kg (171 lb 11.2 oz) 74.2 kg (163 lb 9.6 oz) 75.4 kg (166 lb 4.8 oz)    Exam  General: Well developed, well nourished, NAD, appears stated age  HEENT: NCAT, mucous membranes dry  Neck: Supple, no JVD  Cardiovascular: S1 S2 auscultated, 3/6 SEM, Regular rate and rhythm.  Respiratory: Clear to auscultation bilaterally with equal chest rise  Abdomen: Soft, nontender, nondistended, + bowel sounds  Extremities: warm dry without cyanosis clubbing or edema  Neuro: AAOx3, nonfocal  Psych: Normal affect and demeanor with intact judgement and insight   Data Reviewed: I have personally reviewed following labs and imaging studies  CBC:  Recent Labs Lab 06/17/16 0350 06/18/16 0342 06/19/16 0409 06/20/16 0258 06/21/16 0551  WBC 9.1 9.3 8.4 8.2 9.3  NEUTROABS 6.3 6.4 6.0 5.4 7.3  HGB 10.8* 11.5* 11.0* 11.1* 11.5*  HCT 32.0* 33.5* 33.3* 33.1* 34.5*  MCV 94.7 94.1 94.6 94.3 95.0  PLT 363 367 357 339 355   Basic Metabolic Panel:  Recent Labs Lab 06/16/16 0437 06/17/16 0350 06/18/16 0342 06/19/16 0409 06/21/16 0551  NA 133* 134* 132* 135 134*  K 3.8 3.8 3.8 4.0 4.2  CL 98* 98* 97* 101 99*  CO2 26 27 24 29 26   GLUCOSE 106* 99 108* 104* 98  BUN 7 10 8  5* 10  CREATININE 0.67 0.73 0.70 0.71 0.90  CALCIUM 8.9 9.0 8.9 9.0 9.2   GFR: Estimated Creatinine Clearance: 61.1 mL/min (by C-G formula based on SCr of 0.9 mg/dL). Liver Function  Tests: No results for input(s): AST, ALT, ALKPHOS, BILITOT, PROT, ALBUMIN in the last 168 hours. No results for input(s): LIPASE, AMYLASE in the last 168 hours. No results for input(s): AMMONIA in the last 168 hours. Coagulation Profile: No results for input(s): INR, PROTIME in the last 168 hours. Cardiac Enzymes: No results for input(s): CKTOTAL, CKMB, CKMBINDEX, TROPONINI in the last 168 hours. BNP (last 3 results) No results for input(s): PROBNP in the last 8760 hours. HbA1C: No results for input(s): HGBA1C in the last 72 hours. CBG:  Recent Labs Lab 06/20/16 1253  GLUCAP 100*   Lipid Profile: No results for input(s): CHOL, HDL, LDLCALC, TRIG, CHOLHDL, LDLDIRECT in the last 72 hours. Thyroid Function Tests: No results for input(s): TSH, T4TOTAL, FREET4, T3FREE, THYROIDAB in the last 72 hours. Anemia Panel: No results for input(s): VITAMINB12, FOLATE, FERRITIN, TIBC, IRON, RETICCTPCT in the last 72 hours. Urine analysis:    Component Value Date/Time   COLORURINE YELLOW 12/28/2010 0050   APPEARANCEUR CLEAR 12/28/2010 0050   LABSPEC 1.008 12/28/2010 0050   PHURINE 7.0 12/28/2010 0050   GLUCOSEU NEGATIVE 12/28/2010 0050   HGBUR NEGATIVE 12/28/2010 0050   BILIRUBINUR NEGATIVE 12/28/2010 0050   KETONESUR NEGATIVE 12/28/2010 0050   PROTEINUR NEGATIVE 12/28/2010 0050   UROBILINOGEN 0.2 12/28/2010 0050   NITRITE NEGATIVE 12/28/2010 0050   LEUKOCYTESUR NEGATIVE 12/28/2010 0050   Sepsis Labs: @LABRCNTIP (procalcitonin:4,lacticidven:4)  ) Recent Results (from the past 240 hour(s))  Surgical PCR screen     Status: None   Collection Time: 06/20/16  5:31 AM  Result Value Ref Range Status   MRSA, PCR NEGATIVE NEGATIVE Final   Staphylococcus aureus NEGATIVE NEGATIVE Final    Comment:        The Xpert SA Assay (FDA approved for NASAL specimens in patients over 68 years of age), is one component of a comprehensive surveillance program.  Test performance has been validated by  [facility] for patients greater than or equal to 53 year old. It is not intended to diagnose infection nor to guide or monitor treatment.       Radiology Studies: Dg Chest Port 1 View  Result Date: 06/19/2016 CLINICAL DATA:  Shortness of breath and congestion EXAM: PORTABLE CHEST 1  VIEW COMPARISON:  06/09/2016, 06/15/2016 FINDINGS: Cardiac shadow is mildly enlarged but stable. Postsurgical changes are again seen. Diffuse changes in the upper lobes are noted bilaterally similar to that seen on recent chest x-ray and recent CT examination. No bony abnormality is noted. IMPRESSION: Stable changes in the upper lobes bilaterally. No new focal abnormality is seen. Electronically Signed   By: Inez Catalina M.D.   On: 06/19/2016 14:07     Scheduled Meds: . aspirin EC  81 mg Oral Daily  . bacitracin  1 application Topical TID  . chlorhexidine  15 mL Mouth/Throat BID  . diltiazem  240 mg Oral Daily  . docusate sodium  100 mg Oral BID  . feeding supplement (ENSURE ENLIVE)  237 mL Oral TID BM  . hydrocortisone cream   Topical TID  . metoprolol tartrate  50 mg Oral BID  . rosuvastatin  10 mg Oral Daily  . sodium chloride flush  3 mL Intravenous Q12H  . sodium chloride flush  3 mL Intravenous Q12H   Continuous Infusions: . heparin 1,500 Units/hr (06/21/16 0651)  . lactated ringers 50 mL/hr at 06/20/16 0853  . lactated ringers 10 mL/hr at 06/20/16 1412     LOS: 14 days   Time Spent in minutes   30 minutes  Moise Friday D.O. on 06/21/2016 at 12:41 PM  Between 7am to 7pm - Pager - [phone]  After 7pm go to [email or web address] - password [email or web address]  And look for the night coverage person covering for me after hours  [facility] Office  [phone]

## 2016-06-21 NOTE — Progress Notes (Signed)
Initial Nutrition Assessment  DOCUMENTATION CODES:   Severe malnutrition in context of acute illness/injury  INTERVENTION:    Advance diet to full liquids  Ensure Enlive po TID, each supplement provides 350 kcal and 20 grams of protein  NUTRITION DIAGNOSIS:   Malnutrition related to acute illness as evidenced by energy intake < or equal to 50% for > or equal to 5 days, moderate depletion of body fat, moderate depletions of muscle mass, percent weight loss (10% weight loss within one month).  GOAL:   Patient will meet greater than or equal to 90% of their needs  MONITOR:   Diet advancement, PO intake, Supplement acceptance  REASON FOR ASSESSMENT:   Consult Assessment of nutrition requirement/status (multiple dental extractions)  ASSESSMENT:   83 y.o. male with PMH of atrial fibrillation, CAD, CABG, hyperlipidemia, hypertension, moderate to severe aortic stenosis, who presented to the emergency department on 2/21 with complaints of on/off chest pressure, with dyspnea, orthopnea, and fatigue.   S/P four dental extractions with alveoloplasty and full mouth debridement of remaining dentition on 3/6. Plans for TAVR Thursday. Patient reports poor appetite and limited intake for the past 2 weeks. He has been consuming 25-50% of meals. Suspect intake < 50% of estimated energy requirement. He is ready to try full liquids and Ensure supplements. Patient with 10% weight loss within the past month.  Nutrition-Focused physical exam completed. Findings are moderate fat depletion, moderate muscle depletion, and no edema.  Labs reviewed: sodium 134 Medications reviewed and include Colace.  Diet Order:  Diet full liquid Room service appropriate? Yes; Fluid consistency: Thin  Skin:  Reviewed, no issues  Last BM:  3/6  Height:   Ht Readings from Last 1 Encounters:  06/07/16 5\' 9"  (1.753 m)    Weight:   Wt Readings from Last 1 Encounters:  06/21/16 166 lb 4.8 oz (75.4 kg)     Ideal Body Weight:  72.7 kg  BMI:  Body mass index is 24.56 kg/m.  Estimated Nutritional Needs:   Kcal:  2200-2400  Protein:  105-135 gm  Fluid:  2.2 L  EDUCATION NEEDS:   No education needs identified at this time  Molli Barrows, [place], [place], [place] Pager [phone] After Hours Pager [phone]

## 2016-06-21 NOTE — Progress Notes (Signed)
Pt's son Richard Simon who is also the POA has called twice to get update. He stated that he and the pt daughter will be in town tonight and would like to talk to the surgeons before the surgery tomorrow. I have passed the message to Dr. Cyndia Bent. He said he will call the son.  Ferdinand Lango, RN

## 2016-06-21 NOTE — Progress Notes (Addendum)
POST OPERATIVE NOTE:  06/21/2016 Richard Simon [id number]  VITALS: BP 115/70 (BP Location: Left Arm)   Pulse (!) 108   Temp 98 F (36.7 C) (Oral)   Resp (!) 29   Ht 5\' 9"  (1.753 m)   Wt 166 lb 4.8 oz (75.4 kg)   SpO2 92%   BMI 24.56 kg/m   LABS:  Lab Results  Component Value Date   WBC 9.3 06/21/2016   HGB 11.5 (L) 06/21/2016   HCT 34.5 (L) 06/21/2016   MCV 95.0 06/21/2016   PLT 250 06/21/2016   BMET    Component Value Date/Time   NA 134 (L) 06/21/2016 0551   K 4.2 06/21/2016 0551   CL 99 (L) 06/21/2016 0551   CO2 26 06/21/2016 0551   GLUCOSE 98 06/21/2016 0551   BUN 10 06/21/2016 0551   CREATININE 0.90 06/21/2016 0551   CALCIUM 9.2 06/21/2016 0551   GFRNONAA >60 06/21/2016 0551   GFRAA >60 06/21/2016 0551    Lab Results  Component Value Date   INR 1.29 06/11/2016   INR 1.05 12/27/2010   INR 0.99 08/25/2009   No results found for: PTT   Richard Simon is status post multiple dental extractions with alveoloplasty and full mouth debridement of remaining dentition in the operating with general anesthesia on 06/20/2016.  SUBJECTIVE: Patient denies any acute bleeding or significant discomfort.   EXAM: There is no sign of infection, heme, or ooze. Sutures are intact and clots are present. Intraoral swelling and bruising are noted.  ASSESSMENT: Post operative course is consistent with dental procedures performed in the operating room with general anesthesia  PLAN: 1. Rinse gently with chlorhexidine rinses after breakfast and at bedtime. 2. Brush remaining teeth at least twice a day. 3. Patient is cleared dentally for the anticipated heart valve surgery.   Lenn Cal, DDS

## 2016-06-21 NOTE — Progress Notes (Signed)
Name: Toron Bowring MRN: [id number] DOB: 02-20-1930    ADMISSION DATE:  06/07/2016 CONSULTATION DATE: 06/19/16  REFERRING MD :  Dr. Doyle Askew    CHIEF COMPLAINT:  Pre-Op Clearance for TAVR  BRIEF PATIENT DESCRIPTION:  Xyler Terpening is a 83 y.o. male with Afib, CAD, Dyslipidemia, aortic stenosis, and CABG in 2004.    Admitted 2/21 with chest pressure and SOB. Evaluated by cards and had echo 02/22 that demonstrated EF 45-50% and severely known calcified aortic valve (known AS). Then had cardiac cath 02/27 that confirmed severe / critical AS. 3/1 CT chest/abd/pelvis demonstrated extensive ground glass attenuation and septal thickening throughout the mid to upper lungs bilaterally. PFT's from 03/02 demonstrated FVC 1.64 (42% pred), FEV1 1.55 (58% pred), ratio 95 (133% pred).  SIGNIFICANT EVENTS  02/21 > admit. 03/05 > PCCM consult.  STUDIES:  Echo 02/22 > EF 45-50%, severely calcified aortic valve, PAP 63. Cardiac cath 02/27 > severe / critical aortic stenosis, severe native coronary disease with 4/4 widely patent grafts. CT chest/abd/pelv 03/01 > extensive ground glass attenuation and septal thickening throughout the mid to upper lungs bilaterally. PFT's 03/02 >  FVC 1.64 (42% pred), FEV1 1.55 (58% pred), ratio 95 (133% pred).  SUBJECTIVE:  Remains on Nasal Cannula. Sitting in chair. Denis dyspnea or chest pain.   VITAL SIGNS: Temp:  [97.9 F (36.6 C)-98.4 F (36.9 C)] 98 F (36.7 C) (03/07 0500) Pulse Rate:  [57-114] 114 (03/07 1024) Resp:  [15-29] 29 (03/07 0500) BP: (105-118)/(65-75) 107/70 (03/07 1024) SpO2:  [92 %-100 %] 92 % (03/07 0500) Arterial Line BP: (131-134)/(62-63) 131/63 (03/06 1146) Weight:  [75.4 kg (166 lb 4.8 oz)] 75.4 kg (166 lb 4.8 oz) (03/07 0500)  PHYSICAL EXAMINATION: General:  Adult male, no distress  Neuro:  Alert, oriented, moves all extremities  HEENT:  Normocephalic  Cardiovascular:  Irregular rhythm, Tachy  Lungs:  Unlabored, no crackles/wheezes    Abdomen:  Non-tender, active bowel sounds  Musculoskeletal:  No acute  Skin:  Warm, dry, intact    Recent Labs Lab 06/18/16 0342 06/19/16 0409 06/21/16 0551  NA 132* 135 134*  K 3.8 4.0 4.2  CL 97* 101 99*  CO2 24 29 26   BUN 8 5* 10  CREATININE 0.70 0.71 0.90  GLUCOSE 108* 104* 98    Recent Labs Lab 06/19/16 0409 06/20/16 0258 06/21/16 0551  HGB 11.0* 11.1* 11.5*  HCT 33.3* 33.1* 34.5*  WBC 8.4 8.2 9.3  PLT 357 339 250   Dg Chest Port 1 View  Result Date: 06/19/2016 CLINICAL DATA:  Shortness of breath and congestion EXAM: PORTABLE CHEST 1 VIEW COMPARISON:  06/09/2016, 06/15/2016 FINDINGS: Cardiac shadow is mildly enlarged but stable. Postsurgical changes are again seen. Diffuse changes in the upper lobes are noted bilaterally similar to that seen on recent chest x-ray and recent CT examination. No bony abnormality is noted. IMPRESSION: Stable changes in the upper lobes bilaterally. No new focal abnormality is seen. Electronically Signed   By: Inez Catalina M.D.   On: 06/19/2016 14:07    ASSESSMENT / PLAN:  Discussion: Autoimmune work up negative. CT C/A/P with extensive ground glass suggestive of infectious process with recent CAP where he completed 7 day course of antibiotics.   Planned TAVR on Friday.   Pre-Op Clearance  Plan  Continue Pulmonary Hygiene > IS, flutter valve, mobilize  Diuresis as needed  OK to proceed with surgery from our standpoint (see discussion above). OK to extubate in PACU if meets criteria. Admit to ICU  or SDU post op for close RN and RT attention Scheduled BD's post operatively for a few days  Will follow in Post-Op period if needed   Hayden Pedro, [facility]  Pgr: [phone]  PCCM Pgr: [phone]

## 2016-06-21 NOTE — Progress Notes (Signed)
Progress Note  Patient Name: Richard Simon Date of Encounter: 06/21/2016  Primary Cardiologist: Dr. Roni Bread   Subjective   No chest pain or SOB, only minimal bleeding from mouth.     Inpatient Medications    Scheduled Meds: . aspirin EC  81 mg Oral Daily  . atenolol  75 mg Oral Daily  . bacitracin  1 application Topical TID  . diltiazem  240 mg Oral Daily  . docusate sodium  100 mg Oral BID  . rosuvastatin  10 mg Oral Daily  . sodium chloride flush  3 mL Intravenous Q12H  . sodium chloride flush  3 mL Intravenous Q12H   Continuous Infusions: . heparin 1,500 Units/hr (06/21/16 0651)  . lactated ringers 50 mL/hr at 06/20/16 0853  . lactated ringers 10 mL/hr at 06/20/16 1412   PRN Meds: sodium chloride, acetaminophen, ALPRAZolam, guaiFENesin-dextromethorphan, HYDROcodone-acetaminophen, levalbuterol, magnesium hydroxide, morphine injection, ondansetron **OR** ondansetron (ZOFRAN) IV, sodium chloride flush, witch hazel-glycerin   Vital Signs    Vitals:   06/20/16 1146 06/20/16 1215 06/20/16 2229 06/21/16 0500  BP: 114/65 105/73 118/75 115/70  Pulse: (!) 57 89 (!) 110 (!) 108  Resp: 16 16 15  (!) 29  Temp:  98 F (36.7 C) 98.4 F (36.9 C) 98 F (36.7 C)  TempSrc:   Axillary Oral  SpO2: 100% 98% 94% 92%  Weight:    166 lb 4.8 oz (75.4 kg)  Height:        Intake/Output Summary (Last 24 hours) at 06/21/16 0920 Last data filed at 06/21/16 0730  Gross per 24 hour  Intake             1517 ml  Output               50 ml  Net             1467 ml   Filed Weights   06/19/16 0500 06/20/16 0500 06/21/16 0500  Weight: 171 lb 11.2 oz (77.9 kg) 163 lb 9.6 oz (74.2 kg) 166 lb 4.8 oz (75.4 kg)    Telemetry    A fib with RVR rate was controlled overnight but sitting in chair 120 at times to 140, occ NSVT vs aberrancy   - Personally Reviewed  ECG    Non recently - Personally Reviewed  Physical Exam  GEN: No acute distress.   Neck: No JVD Cardiac: RRR, 3/6 systolic  aortic- squeaky ant murmur, no rubs, or gallops.  Respiratory: Clear to auscultation bilaterally. GI: Soft, nontender, non-distended  MS: No edema; No deformity. Neuro:  Nonfocal  Psych: Normal affect   Labs    Chemistry Recent Labs Lab 06/18/16 0342 06/19/16 0409 06/21/16 0551  NA 132* 135 134*  K 3.8 4.0 4.2  CL 97* 101 99*  CO2 24 29 26   GLUCOSE 108* 104* 98  BUN 8 5* 10  CREATININE 0.70 0.71 0.90  CALCIUM 8.9 9.0 9.2  GFRNONAA >60 >60 >60  GFRAA >60 >60 >60  ANIONGAP 11 5 9      Hematology Recent Labs Lab 06/19/16 0409 06/20/16 0258 06/21/16 0551  WBC 8.4 8.2 9.3  RBC 3.52* 3.51* 3.63*  HGB 11.0* 11.1* 11.5*  HCT 33.3* 33.1* 34.5*  MCV 94.6 94.3 95.0  MCH 31.3 31.6 31.7  MCHC 33.0 33.5 33.3  RDW 12.5 12.7 12.6  PLT 357 339 250    Cardiac EnzymesNo results for input(s): TROPONINI in the last 168 hours. No results for input(s): TROPIPOC in the last 168 hours.  BNPNo results for input(s): BNP, PROBNP in the last 168 hours.   DDimer No results for input(s): DDIMER in the last 168 hours.   Radiology    Dg Chest Port 1 View  Result Date: 06/19/2016 CLINICAL DATA:  Shortness of breath and congestion EXAM: PORTABLE CHEST 1 VIEW COMPARISON:  06/09/2016, 06/15/2016 FINDINGS: Cardiac shadow is mildly enlarged but stable. Postsurgical changes are again seen. Diffuse changes in the upper lobes are noted bilaterally similar to that seen on recent chest x-ray and recent CT examination. No bony abnormality is noted. IMPRESSION: Stable changes in the upper lobes bilaterally. No new focal abnormality is seen. Electronically Signed   By: Inez Catalina M.D.   On: 06/19/2016 14:07    Cardiac Studies   Echo: Study Conclusions  - Left ventricle: The cavity size was normal. Systolic function was   mildly reduced. The estimated ejection fraction was in the range   of 45% to 50%. Diffuse hypokinesis. - Aortic valve: Severely calcified annulus. Trileaflet; severely    thickened, severely calcified leaflets. Valve mobility was   restricted. There was critical stenosis. There was moderate   regurgitation. Valve area (VTI): 0.26 cm^2. Valve area (Vmax):   0.32 cm^2. Valve area (Vmean): 0.26 cm^2. - Mitral valve: Moderately calcified annulus. Transvalvular   velocity was within the normal range. There was no evidence for   stenosis. There was mild regurgitation. - Left atrium: The atrium was mildly dilated. - Right ventricle: Systolic function was normal. - Right atrium: The atrium was mildly dilated. - Atrial septum: No defect or patent foramen ovale was identified   by color flow Doppler. - Tricuspid valve: There was trivial regurgitation. - Pulmonary arteries: Systolic pressure was severely increased. PA   peak pressure: 63 mm Hg (S).  CARDIAC CATH 06/13/16 Conclusion     Echocardiographic evidence of Severe/Critical Aortic Stenosis - now symptomatic  Hemodynamic findings consistent with mild secondary pulmonary hypertension.  Mid LAD lesion, 70 %stenosed. Dist LAD lesion, 80 %stenosed.  LIMA-dLAD is normal in caliber and anatomically normal.  Ost 2nd Diag to 2nd Diag lesion, 95 %stenosed.  SVG-Diag2 is normal in caliber and anatomically normal.  Ost 1st Mrg to 1st Mrg lesion, 100 %stenosed.  Mid Cx lesion, 100 %stenosed.  Seq SVG- OM1-LPL(dCx) and is large and anatomically normal.  Small nondominant RCA patent   4/4 widely patent grafts with severe native coronary disease  Mildly elevated PA pressures with elevated PCWP.  Plan:  Return to nursing unit for ongoing care and management per primary service as well as cardiology consultation team.  TR band removal per protocol  CT surgery has been consulted for evaluation for possible aortic valve replacement (likley TAVR)      Patient Profile     83 y.o. male with critical aortic stenosis and heart failure  Assessment & Plan    1. Acute on chronic combined systolic  and diastolic heart failure: multifactorial, critical AS primary issue. Now neg 1495 and wt is up from 163 to 166.    2. Permanent atrial fibrillation: Heart rate is better controlled in bed but with activity- sitting in chair it is elevated. He is on a combination of atenolol and diltiazem. IV heparin used for bridging and will plan to resume oral anticoagulation after TAVR. heparinhas been resumed.  3. Critical aortic stenosis: The patient has poor dentition and underwentdental extraction yesterday by Dr. Lawana Chambers. Scheduled for TAVR this Thursday. Plan has been reviewed with multidisciplinary team- per Dr. Burt Knack.  RN  tells me surgery on Friday  4. NSVT short bursts at times vs.aberrancy  Signed, Cecilie Kicks, NP  06/21/2016, 9:20 AM    Patient seen, examined. Available data reviewed. Agree with findings, assessment, and plan as outlined by Cecilie Kicks, NP. On exam, the patient is alert and oriented in no distress. Lungs are clear today. Heart is tachycardic and regular with a grade 4/6 partially peaking systolic murmur with absent A2. Abdomen is soft and nontender. Extremities are without edema.  The patient appears stable for TAVR scheduled tomorrow afternoon. He will continue on IV heparin. He's had a good diuresis during his hospitalization. His atrial fibrillation is not currently well controlled. I'm going to discontinue atenolol and start metoprolol tartrate which can be more easily adjusted to control his atrial fibrillation. Otherwise no medication changes today. Discussed TAVR in detail with the patient. He understands the risks, indications and alternatives which were reviewed at length with him.  Sherren Mocha, M.D. 06/21/2016 11:28 AM

## 2016-06-21 NOTE — Progress Notes (Signed)
Physical Therapy Treatment Patient Details Name: Richard Simon MRN: [id number] DOB: 02-15-1930 Today's Date: 06/21/2016    History of Present Illness Pt is a 83 y.o. male with medical history significant of atrial fibrillation on Pradaxa, CAD, CABG, hyperlipidemia, hypertension, moderate to severe aortic stenosis is coming to the emergency department with complaints of on/off chest pressure, associated with dyspnea, orthopnea and fatigue. He was diagnosed with hyponatremia    PT Comments    Pt is up to walk with PT noting changes in O2 sats from 93 to 83% and then back to 97% with rest on2L O2.  His pulses ranged from 113 to 134, and were very good about declining to a better range with rest.  Has fluctuated even at rest.  Will continue acutely to progress mobiltiy as his medical condition tolerates and per MD visit during PT appt is scheduled for a TAVR tomorrow.  Will sign off tomorrow and get a new order for PT if pt receives the surgery.   Follow Up Recommendations  SNF     Equipment Recommendations  Rolling walker with 5" wheels    Recommendations for Other Services       Precautions / Restrictions Precautions Precautions: Fall Precaution Comments: telemetry, watch sats and pulse Restrictions Weight Bearing Restrictions: No    Mobility  Bed Mobility               General bed mobility comments: up in chair  Transfers Overall transfer level: Needs assistance Equipment used: 1 person hand held assist;None Transfers: Sit to/from [facility] Sit to Stand: Min guard Stand pivot transfers: Min guard       General transfer comment: reminders for safety and to use correct hand placement  Ambulation/Gait Ambulation/Gait assistance: Min guard Ambulation Distance (Feet): 90 Feet Assistive device: 1 person hand held assist (held onto IV pole) Gait Pattern/deviations: Step-through pattern;Decreased stride length;Wide base of support;Trunk flexed Gait  velocity: decreased Gait velocity interpretation: Below normal speed for age/gender General Gait Details: wide turns and reminders to control pace as O2 sats declined with gait   Stairs            Wheelchair Mobility    Modified Rankin (Stroke Patients Only)       Balance     Sitting balance-Leahy Scale: Fair       Standing balance-Leahy Scale: Fair Standing balance comment: less than fair dynamically                    Cognition Arousal/Alertness: Awake/alert Behavior During Therapy: WFL for tasks assessed/performed Overall Cognitive Status: No family/caregiver present to determine baseline cognitive functioning Area of Impairment: Following commands;Safety/judgement;Awareness Orientation Level: Time   Memory: Decreased recall of precautions;Decreased short-term memory Following Commands: Follows one step commands with increased time Safety/Judgement: Decreased awareness of safety Awareness: Intellectual Problem Solving: Decreased initiation;Difficulty sequencing;Requires verbal cues;Requires tactile cues General Comments: needs repetitive cues for mobility esp regarding hand placement    Exercises General Exercises - Lower Extremity Ankle Circles/Pumps: AROM;Both;5 reps (holding stretches) Quad Sets: AROM;Strengthening;Both;10 reps Gluteal Sets: AROM;Strengthening;Both;10 reps Hip ABduction/ADduction: AAROM;Both;10 reps    General Comments General comments (skin integrity, edema, etc.): O2 was down from 93% to 83% then back to 97% after walk but pulses fluctuate from 113 to 134      Pertinent Vitals/Pain Pain Assessment: No/denies pain    Home Living Family/patient expects to be discharged to:: Skilled nursing facility  Prior Function            PT Goals (current goals can now be found in the care plan section) Acute Rehab PT Goals PT Goal Formulation: With patient Progress towards PT goals: Progressing toward  goals    Frequency    Min 3X/week      PT Plan Current plan remains appropriate    Co-evaluation             End of Session Equipment Utilized During Treatment: Gait belt;Oxygen Activity Tolerance: Patient limited by fatigue;Treatment limited secondary to medical complications (Comment) (pulses and O2 sats fluctuating) Patient left: in chair;with call bell/phone within reach;with chair alarm set Nurse Communication: Mobility status PT Visit Diagnosis: Unsteadiness on feet (R26.81);Muscle weakness (generalized) (M62.81)     Time: 8453-6468 PT Time Calculation (min) (ACUTE ONLY): 37 min  Charges:  $Gait Training: 8-22 mins $Therapeutic Exercise: 8-22 mins                    G Codes:       Ramond Dial 17-Jul-2016, 1:11 PM   Mee Hives, PT MS Acute Rehab Dept. Number: [facility]

## 2016-06-21 NOTE — Progress Notes (Signed)
1 Day Post-Op Procedure(s) (LRB): EXTRACTION of #7, 12, 13, and 15 WITH ALVEOLOPLASTY and gross debridement of teeth (N/A) Subjective:  No chest pain or shortness of breath.No complaints except for butt sore from laying in bed. Mouth feels ok after extractions. No bleeding.  Objective: Vital signs in last 24 hours: Temp:  [98 F (36.7 C)-98.5 F (36.9 C)] 98.5 F (36.9 C) (03/07 1456) Pulse Rate:  [89-114] 89 (03/07 1456) Cardiac Rhythm: Atrial fibrillation (03/07 0730) Resp:  [15-29] 23 (03/07 1456) BP: (100-118)/(59-75) 100/59 (03/07 1456) SpO2:  [92 %-95 %] 95 % (03/07 1456) Weight:  [75.4 kg (166 lb 4.8 oz)] 75.4 kg (166 lb 4.8 oz) (03/07 0500)  Hemodynamic parameters for last 24 hours:    Intake/Output from previous day: 03/06 0701 - 03/07 0700 In: 938 [P.O.:120; I.V.:818] Out: 450 [Urine:400; Blood:50] Intake/Output this shift: Total I/O In: 579 [P.O.:579] Out: -   General appearance: alert and cooperative Mouth looks ok Neurologic: intact Heart: irregularly irregular rhythm and 3/6 systolic murmur  Lungs: diminished breath sounds bibasilar  Lab Results:  Recent Labs  06/20/16 0258 06/21/16 0551  WBC 8.2 9.3  HGB 11.1* 11.5*  HCT 33.1* 34.5*  PLT 339 250   BMET:  Recent Labs  06/19/16 0409 06/21/16 0551  NA 135 134*  K 4.0 4.2  CL 101 99*  CO2 29 26  GLUCOSE 104* 98  BUN 5* 10  CREATININE 0.71 0.90  CALCIUM 9.0 9.2    PT/INR: No results for input(s): LABPROT, INR in the last 72 hours. ABG    Component Value Date/Time   PHART 7.437 06/13/2016 1548   HCO3 28.1 (H) 06/13/2016 1608   TCO2 29 06/13/2016 1608   O2SAT 54.0 06/13/2016 1608   CBG (last 3)   Recent Labs  06/20/16 1253  GLUCAP 100*    Assessment/Plan:  Acute on chronic systolic and diastolic heart failure due to critical aortic stenosis.  S/p multiple tooth extractions yesterday.   He is stable for TAVR tomorrow. I discussed the procedure again with him. We discussed  complications that might develop including but not limited to risks of death, stroke, paravalvular leak, aortic dissection or other major vascular complications, aortic annulus rupture, device embolization, cardiac rupture or perforation, mitral regurgitation, acute myocardial infarction, arrhythmia, heart block or bradycardia requiring permanent pacemaker placement, congestive heart failure, respiratory failure, renal failure, pneumonia, infection, other late complications related to structural valve deterioration or migration, or other complications that might ultimately cause a temporary or permanent loss of functional independence or other long term morbidity. The patient provides full informed consent for the procedure as described and all questions were answered.      LOS: 14 days    Gaye Pollack 06/21/2016

## 2016-06-22 ENCOUNTER — Inpatient Hospital Stay (HOSPITAL_COMMUNITY): Payer: Medicare HMO | Admitting: Anesthesiology

## 2016-06-22 ENCOUNTER — Inpatient Hospital Stay (HOSPITAL_COMMUNITY): Payer: Medicare HMO

## 2016-06-22 ENCOUNTER — Encounter (HOSPITAL_COMMUNITY): Payer: Self-pay | Admitting: Certified Registered"

## 2016-06-22 ENCOUNTER — Encounter (HOSPITAL_COMMUNITY)
Admission: EM | Disposition: A | Discharge status: Skilled Nursing Facility | Payer: Self-pay | Source: Home / Self Care | Attending: Internal Medicine

## 2016-06-22 DIAGNOSIS — I350 Nonrheumatic aortic (valve) stenosis: Secondary | ICD-10-CM

## 2016-06-22 DIAGNOSIS — Z006 Encounter for examination for normal comparison and control in clinical research program: Secondary | ICD-10-CM

## 2016-06-22 DIAGNOSIS — R079 Chest pain, unspecified: Secondary | ICD-10-CM

## 2016-06-22 DIAGNOSIS — Z953 Presence of xenogenic heart valve: Secondary | ICD-10-CM

## 2016-06-22 HISTORY — PX: TEE WITHOUT CARDIOVERSION: SHX5443

## 2016-06-22 HISTORY — PX: TRANSCATHETER AORTIC VALVE REPLACEMENT, TRANSFEMORAL: SHX6400

## 2016-06-22 HISTORY — DX: Presence of xenogenic heart valve: Z95.3

## 2016-06-22 LAB — BASIC METABOLIC PANEL
ANION GAP: 9 (ref 5–15)
BUN: 9 mg/dL (ref 6–20)
CALCIUM: 9.1 mg/dL (ref 8.9–10.3)
CO2: 28 mmol/L (ref 22–32)
CREATININE: 0.81 mg/dL (ref 0.61–1.24)
Chloride: 97 mmol/L — ABNORMAL LOW (ref 101–111)
Glucose, Bld: 106 mg/dL — ABNORMAL HIGH (ref 65–99)
Potassium: 4.1 mmol/L (ref 3.5–5.1)
Sodium: 134 mmol/L — ABNORMAL LOW (ref 135–145)

## 2016-06-22 LAB — PROTIME-INR
INR: 1.24
INR: 1.39
Prothrombin Time: 15.7 seconds — ABNORMAL HIGH (ref 11.4–15.2)
Prothrombin Time: 17.2 seconds — ABNORMAL HIGH (ref 11.4–15.2)

## 2016-06-22 LAB — CBC WITH DIFFERENTIAL/PLATELET
BASOS ABS: 0 10*3/uL (ref 0.0–0.1)
BASOS PCT: 0 %
EOS ABS: 0.3 10*3/uL (ref 0.0–0.7)
EOS PCT: 2 %
HCT: 35 % — ABNORMAL LOW (ref 39.0–52.0)
Hemoglobin: 11.7 g/dL — ABNORMAL LOW (ref 13.0–17.0)
LYMPHS PCT: 11 %
Lymphs Abs: 1.2 10*3/uL (ref 0.7–4.0)
MCH: 31.6 pg (ref 26.0–34.0)
MCHC: 33.4 g/dL (ref 30.0–36.0)
MCV: 94.6 fL (ref 78.0–100.0)
MONO ABS: 1 10*3/uL (ref 0.1–1.0)
Monocytes Relative: 8 %
Neutro Abs: 9.2 10*3/uL — ABNORMAL HIGH (ref 1.7–7.7)
Neutrophils Relative %: 79 %
PLATELETS: 328 10*3/uL (ref 150–400)
RBC: 3.7 MIL/uL — AB (ref 4.22–5.81)
RDW: 12.8 % (ref 11.5–15.5)
WBC: 11.8 10*3/uL — AB (ref 4.0–10.5)

## 2016-06-22 LAB — POCT I-STAT 3, ART BLOOD GAS (G3+)
ACID-BASE EXCESS: 2 mmol/L (ref 0.0–2.0)
Bicarbonate: 26.9 mmol/L (ref 20.0–28.0)
O2 SAT: 98 %
PO2 ART: 108 mmHg (ref 83.0–108.0)
Patient temperature: 37.2
TCO2: 28 mmol/L (ref 0–100)
pCO2 arterial: 41.5 mmHg (ref 32.0–48.0)
pH, Arterial: 7.422 (ref 7.350–7.450)

## 2016-06-22 LAB — URINALYSIS, ROUTINE W REFLEX MICROSCOPIC
Bilirubin Urine: NEGATIVE
GLUCOSE, UA: NEGATIVE mg/dL
Hgb urine dipstick: NEGATIVE
KETONES UR: NEGATIVE mg/dL
LEUKOCYTES UA: NEGATIVE
Nitrite: NEGATIVE
Protein, ur: NEGATIVE mg/dL
SPECIFIC GRAVITY, URINE: 1.02 (ref 1.005–1.030)
pH: 6 (ref 5.0–8.0)

## 2016-06-22 LAB — POCT I-STAT 4, (NA,K, GLUC, HGB,HCT)
GLUCOSE: 124 mg/dL — AB (ref 65–99)
HEMATOCRIT: 29 % — AB (ref 39.0–52.0)
HEMOGLOBIN: 9.9 g/dL — AB (ref 13.0–17.0)
Potassium: 3.7 mmol/L (ref 3.5–5.1)
SODIUM: 136 mmol/L (ref 135–145)

## 2016-06-22 LAB — BLOOD GAS, ARTERIAL
ACID-BASE EXCESS: 2.8 mmol/L — AB (ref 0.0–2.0)
BICARBONATE: 26.6 mmol/L (ref 20.0–28.0)
DRAWN BY: 313061
FIO2: 0.21
O2 Saturation: 93 %
PH ART: 7.449 (ref 7.350–7.450)
PO2 ART: 67.3 mmHg — AB (ref 83.0–108.0)
Patient temperature: 98.6
pCO2 arterial: 38.9 mmHg (ref 32.0–48.0)

## 2016-06-22 LAB — APTT
APTT: 137 s — AB (ref 24–36)
aPTT: 41 seconds — ABNORMAL HIGH (ref 24–36)

## 2016-06-22 LAB — POCT I-STAT, CHEM 8
BUN: 7 mg/dL (ref 6–20)
BUN: 6 mg/dL (ref 6–20)
CALCIUM ION: 1.26 mmol/L (ref 1.15–1.40)
CHLORIDE: 97 mmol/L — AB (ref 101–111)
CHLORIDE: 97 mmol/L — AB (ref 101–111)
CREATININE: 0.5 mg/dL — AB (ref 0.61–1.24)
Calcium, Ion: 1.26 mmol/L (ref 1.15–1.40)
Creatinine, Ser: 0.6 mg/dL — ABNORMAL LOW (ref 0.61–1.24)
GLUCOSE: 129 mg/dL — AB (ref 65–99)
GLUCOSE: 130 mg/dL — AB (ref 65–99)
HCT: 29 % — ABNORMAL LOW (ref 39.0–52.0)
HCT: 29 % — ABNORMAL LOW (ref 39.0–52.0)
Hemoglobin: 9.9 g/dL — ABNORMAL LOW (ref 13.0–17.0)
Hemoglobin: 9.9 g/dL — ABNORMAL LOW (ref 13.0–17.0)
POTASSIUM: 3.6 mmol/L (ref 3.5–5.1)
POTASSIUM: 3.6 mmol/L (ref 3.5–5.1)
Sodium: 135 mmol/L (ref 135–145)
Sodium: 135 mmol/L (ref 135–145)
TCO2: 30 mmol/L (ref 0–100)
TCO2: 26 mmol/L (ref 0–100)

## 2016-06-22 LAB — CBC
HEMATOCRIT: 32.4 % — AB (ref 39.0–52.0)
Hemoglobin: 10.7 g/dL — ABNORMAL LOW (ref 13.0–17.0)
MCH: 31 pg (ref 26.0–34.0)
MCHC: 33 g/dL (ref 30.0–36.0)
MCV: 93.9 fL (ref 78.0–100.0)
PLATELETS: 255 10*3/uL (ref 150–400)
RBC: 3.45 MIL/uL — ABNORMAL LOW (ref 4.22–5.81)
RDW: 12.6 % (ref 11.5–15.5)
WBC: 18.9 10*3/uL — ABNORMAL HIGH (ref 4.0–10.5)

## 2016-06-22 LAB — ABO/RH: ABO/RH(D): A POS

## 2016-06-22 LAB — HEPARIN LEVEL (UNFRACTIONATED): HEPARIN UNFRACTIONATED: 0.49 [IU]/mL (ref 0.30–0.70)

## 2016-06-22 SURGERY — IMPLANTATION, AORTIC VALVE, TRANSCATHETER, FEMORAL APPROACH
Anesthesia: General | Site: Chest

## 2016-06-22 MED ORDER — SUGAMMADEX SODIUM 200 MG/2ML IV SOLN
INTRAVENOUS | Status: DC | PRN
  Administered 2016-06-22: 100 mg via INTRAVENOUS

## 2016-06-22 MED ORDER — LIDOCAINE 2% (20 MG/ML) 5 ML SYRINGE
INTRAMUSCULAR | Status: AC
  Filled 2016-06-22: qty 5

## 2016-06-22 MED ORDER — PROTAMINE SULFATE 10 MG/ML IV SOLN
INTRAVENOUS | Status: DC | PRN
  Administered 2016-06-22: 180 mg via INTRAVENOUS

## 2016-06-22 MED ORDER — PHENYLEPHRINE HCL 10 MG/ML IJ SOLN
INTRAVENOUS | Status: DC | PRN
  Administered 2016-06-22: 50 ug/min via INTRAVENOUS

## 2016-06-22 MED ORDER — FENTANYL CITRATE (PF) 250 MCG/5ML IJ SOLN
INTRAMUSCULAR | Status: AC
  Filled 2016-06-22: qty 5

## 2016-06-22 MED ORDER — 0.9 % SODIUM CHLORIDE (POUR BTL) OPTIME
TOPICAL | Status: DC | PRN
  Administered 2016-06-22: 5000 mL

## 2016-06-22 MED ORDER — SODIUM CHLORIDE 0.9 % IV SOLN
30.0000 meq | Freq: Once | INTRAVENOUS | Status: AC
  Administered 2016-06-22: 30 meq via INTRAVENOUS
  Filled 2016-06-22: qty 15

## 2016-06-22 MED ORDER — SODIUM CHLORIDE 0.9% FLUSH
3.0000 mL | INTRAVENOUS | Status: DC | PRN
Start: 2016-06-22 — End: 2016-06-24

## 2016-06-22 MED ORDER — MIDAZOLAM HCL 2 MG/2ML IJ SOLN
INTRAMUSCULAR | Status: AC
  Filled 2016-06-22: qty 2

## 2016-06-22 MED ORDER — DEXTROSE 5 % IV SOLN
1.5000 g | Freq: Two times a day (BID) | INTRAVENOUS | Status: AC
  Administered 2016-06-22 – 2016-06-24 (×4): 1.5 g via INTRAVENOUS
  Filled 2016-06-22 (×5): qty 1.5

## 2016-06-22 MED ORDER — ONDANSETRON HCL 4 MG/2ML IJ SOLN
INTRAMUSCULAR | Status: DC | PRN
  Administered 2016-06-22: 4 mg via INTRAVENOUS

## 2016-06-22 MED ORDER — PROPOFOL 10 MG/ML IV BOLUS
INTRAVENOUS | Status: DC | PRN
  Administered 2016-06-22: 70 mg via INTRAVENOUS

## 2016-06-22 MED ORDER — FAMOTIDINE IN NACL 20-0.9 MG/50ML-% IV SOLN: 20.0000 mg | Freq: Two times a day (BID) | INTRAVENOUS | Status: DC

## 2016-06-22 MED ORDER — MORPHINE SULFATE (PF) 2 MG/ML IV SOLN: 2.0000 mg | INTRAVENOUS | Status: DC | PRN

## 2016-06-22 MED ORDER — SODIUM CHLORIDE 0.9 % IV SOLN
1.0000 mL/kg/h | INTRAVENOUS | Status: AC
  Administered 2016-06-22: 1 mL/kg/h via INTRAVENOUS

## 2016-06-22 MED ORDER — LIDOCAINE HCL (CARDIAC) 20 MG/ML IV SOLN
INTRAVENOUS | Status: DC | PRN
  Administered 2016-06-22: 60 mg via INTRAVENOUS

## 2016-06-22 MED ORDER — VANCOMYCIN HCL IN DEXTROSE 1-5 GM/200ML-% IV SOLN
1000.0000 mg | Freq: Once | INTRAVENOUS | Status: AC
  Administered 2016-06-23: 1000 mg via INTRAVENOUS
  Filled 2016-06-22: qty 200

## 2016-06-22 MED ORDER — TRAMADOL HCL 50 MG PO TABS: 50.0000 mg | ORAL_TABLET | ORAL | Status: DC | PRN

## 2016-06-22 MED ORDER — FENTANYL CITRATE (PF) 100 MCG/2ML IJ SOLN
INTRAMUSCULAR | Status: DC | PRN
  Administered 2016-06-22: 50 ug via INTRAVENOUS
  Administered 2016-06-22: 100 ug via INTRAVENOUS
  Administered 2016-06-22 (×2): 50 ug via INTRAVENOUS

## 2016-06-22 MED ORDER — SODIUM CHLORIDE 0.9 % IV SOLN
INTRAVENOUS | Status: DC | PRN
  Administered 2016-06-22 (×3): 500 mL

## 2016-06-22 MED ORDER — SODIUM CHLORIDE 0.9% FLUSH: 3.0000 mL | Freq: Two times a day (BID) | INTRAVENOUS | Status: DC

## 2016-06-22 MED ORDER — PANTOPRAZOLE SODIUM 40 MG PO TBEC
40.0000 mg | DELAYED_RELEASE_TABLET | Freq: Every day | ORAL | Status: DC
  Administered 2016-06-24 – 2016-06-27 (×4): 40 mg via ORAL
  Filled 2016-06-22 (×4): qty 1

## 2016-06-22 MED ORDER — SODIUM CHLORIDE 0.9% FLUSH
10.0000 mL | Freq: Two times a day (BID) | INTRAVENOUS | Status: DC
  Administered 2016-06-23: 10 mL

## 2016-06-22 MED ORDER — DEXMEDETOMIDINE HCL IN NACL 400 MCG/100ML IV SOLN
INTRAVENOUS | Status: DC | PRN
  Administered 2016-06-22: .5 ug/kg/h via INTRAVENOUS

## 2016-06-22 MED ORDER — ROCURONIUM BROMIDE 50 MG/5ML IV SOSY
PREFILLED_SYRINGE | INTRAVENOUS | Status: AC
  Filled 2016-06-22: qty 5

## 2016-06-22 MED ORDER — PROPOFOL 10 MG/ML IV BOLUS
INTRAVENOUS | Status: AC
  Filled 2016-06-22: qty 20

## 2016-06-22 MED ORDER — SODIUM CHLORIDE 0.9 % IV SOLN
0.0000 ug/min | INTRAVENOUS | Status: DC
  Filled 2016-06-22: qty 2

## 2016-06-22 MED ORDER — ALBUMIN HUMAN 5 % IV SOLN: 250.0000 mL | INTRAVENOUS | Status: AC | PRN

## 2016-06-22 MED ORDER — SODIUM CHLORIDE 0.9 % IV SOLN: 250.0000 mL | INTRAVENOUS | Status: DC | PRN

## 2016-06-22 MED ORDER — ORAL CARE MOUTH RINSE
15.0000 mL | Freq: Two times a day (BID) | OROMUCOSAL | Status: DC
  Administered 2016-06-22 – 2016-06-27 (×5): 15 mL via OROMUCOSAL

## 2016-06-22 MED ORDER — CHLORHEXIDINE GLUCONATE CLOTH 2 % EX PADS
6.0000 | MEDICATED_PAD | Freq: Every day | CUTANEOUS | Status: DC
  Administered 2016-06-23: 6 via TOPICAL

## 2016-06-22 MED ORDER — GERHARDT'S BUTT CREAM
TOPICAL_CREAM | Freq: Two times a day (BID) | CUTANEOUS | Status: DC
  Administered 2016-06-22 – 2016-06-25 (×6): via TOPICAL
  Administered 2016-06-26: 1 via TOPICAL
  Administered 2016-06-26: 22:00:00 via TOPICAL
  Administered 2016-06-27: 1 via TOPICAL
  Filled 2016-06-22 (×2): qty 1

## 2016-06-22 MED ORDER — IODIXANOL 320 MG/ML IV SOLN
INTRAVENOUS | Status: DC | PRN
  Administered 2016-06-22: 63 mL via INTRA_ARTERIAL

## 2016-06-22 MED ORDER — ROCURONIUM BROMIDE 100 MG/10ML IV SOLN
INTRAVENOUS | Status: DC | PRN
  Administered 2016-06-22: 50 mg via INTRAVENOUS

## 2016-06-22 MED ORDER — LACTATED RINGERS IV SOLN
INTRAVENOUS | Status: DC | PRN
  Administered 2016-06-22: 13:00:00 via INTRAVENOUS

## 2016-06-22 MED ORDER — HEPARIN SODIUM (PORCINE) 1000 UNIT/ML IJ SOLN
INTRAMUSCULAR | Status: DC | PRN
  Administered 2016-06-22: 18000 [IU] via INTRAVENOUS

## 2016-06-22 MED ORDER — SODIUM CHLORIDE 0.9% FLUSH: 10.0000 mL | INTRAVENOUS | Status: DC | PRN

## 2016-06-22 MED ORDER — CHLORHEXIDINE GLUCONATE 4 % EX LIQD
CUTANEOUS | Status: AC
  Administered 2016-06-22: 12:00:00
  Filled 2016-06-22: qty 15

## 2016-06-22 MED ORDER — OXYCODONE HCL 5 MG PO TABS: 5.0000 mg | ORAL_TABLET | ORAL | Status: DC | PRN

## 2016-06-22 MED ORDER — HEPARIN SODIUM (PORCINE) 1000 UNIT/ML IJ SOLN
INTRAMUSCULAR | Status: AC
  Filled 2016-06-22: qty 1

## 2016-06-22 MED ORDER — LACTATED RINGERS IV SOLN: 500.0000 mL | Freq: Once | INTRAVENOUS | Status: DC | PRN

## 2016-06-22 MED ORDER — ONDANSETRON HCL 4 MG/2ML IJ SOLN
INTRAMUSCULAR | Status: AC
  Filled 2016-06-22: qty 2

## 2016-06-22 MED ORDER — NITROGLYCERIN IN D5W 200-5 MCG/ML-% IV SOLN
0.0000 ug/min | INTRAVENOUS | Status: DC
  Administered 2016-06-22: 5 ug/min via INTRAVENOUS

## 2016-06-22 MED FILL — Magnesium Sulfate Inj 50%: INTRAMUSCULAR | Qty: 10 | Status: AC

## 2016-06-22 MED FILL — Heparin Sodium (Porcine) Inj 1000 Unit/ML: INTRAMUSCULAR | Qty: 30 | Status: AC

## 2016-06-22 MED FILL — Potassium Chloride Inj 2 mEq/ML: INTRAVENOUS | Qty: 40 | Status: AC

## 2016-06-22 SURGICAL SUPPLY — 101 items
ADAPTER UNIV SWAN GANZ BIP (ADAPTER) ×2 IMPLANT
ADAPTER UNV SWAN GANZ BIP (ADAPTER) ×2
ATTRACTOMAT 16X20 MAGNETIC DRP (DRAPES) IMPLANT
BAG BANDED W/RUBBER/TAPE 36X54 (MISCELLANEOUS) ×4 IMPLANT
BAG DECANTER FOR FLEXI CONT (MISCELLANEOUS) IMPLANT
BAG SNAP BAND KOVER 36X36 (MISCELLANEOUS) ×8 IMPLANT
BLADE 10 SAFETY STRL DISP (BLADE) IMPLANT
BLADE CLIPPER SURG (BLADE) IMPLANT
BLADE STERNUM SYSTEM 6 (BLADE) ×4 IMPLANT
CABLE PACING FASLOC BIEGE (MISCELLANEOUS) ×4 IMPLANT
CABLE PACING FASLOC BLUE (MISCELLANEOUS) ×4 IMPLANT
CANISTER SUCT 3000ML PPV (MISCELLANEOUS) IMPLANT
CANNULA FEM VENOUS REMOTE 22FR (CANNULA) IMPLANT
CANNULA OPTISITE PERFUSION 16F (CANNULA) IMPLANT
CANNULA OPTISITE PERFUSION 18F (CANNULA) IMPLANT
CATH ANGIO 5F BER2 65CM (CATHETERS) ×4 IMPLANT
CATH DIAG EXPO 6F VENT PIG 145 (CATHETERS) ×8 IMPLANT
CATH EXPO 5FR AL1 (CATHETERS) ×4 IMPLANT
CATH S G BIP PACING (SET/KITS/TRAYS/PACK) ×8 IMPLANT
CLIP TI MEDIUM 24 (CLIP) IMPLANT
CLIP TI WIDE RED SMALL 24 (CLIP) IMPLANT
CONT SPEC 4OZ CLIKSEAL STRL BL (MISCELLANEOUS) ×8 IMPLANT
COVER BACK TABLE 60X90IN (DRAPES) ×4 IMPLANT
COVER BACK TABLE 80X110 HD (DRAPES) ×4 IMPLANT
COVER DOME SNAP 22 D (MISCELLANEOUS) ×4 IMPLANT
COVER MAYO STAND STRL (DRAPES) ×4 IMPLANT
CRADLE DONUT ADULT HEAD (MISCELLANEOUS) ×4 IMPLANT
DERMABOND ADVANCED (GAUZE/BANDAGES/DRESSINGS) ×2
DERMABOND ADVANCED .7 DNX12 (GAUZE/BANDAGES/DRESSINGS) ×2 IMPLANT
DEVICE CLOSURE PERCLS PRGLD 6F (VASCULAR PRODUCTS) IMPLANT
DRAPE INCISE IOBAN 66X45 STRL (DRAPES) IMPLANT
DRAPE SLUSH MACHINE 52X66 (DRAPES) ×4 IMPLANT
DRSG TEGADERM 4X4.75 (GAUZE/BANDAGES/DRESSINGS) ×4 IMPLANT
ELECT REM PT RETURN 9FT ADLT (ELECTROSURGICAL) ×8
ELECTRODE REM PT RTRN 9FT ADLT (ELECTROSURGICAL) ×4 IMPLANT
FELT TEFLON 6X6 (MISCELLANEOUS) ×4 IMPLANT
FEMORAL VENOUS CANN RAP (CANNULA) IMPLANT
GAUZE SPONGE 4X4 12PLY STRL (GAUZE/BANDAGES/DRESSINGS) ×4 IMPLANT
GAUZE SPONGE 4X4 12PLY STRL LF (GAUZE/BANDAGES/DRESSINGS) ×8 IMPLANT
GLOVE BIO SURGEON STRL SZ7.5 (GLOVE) ×4 IMPLANT
GLOVE BIO SURGEON STRL SZ8 (GLOVE) ×8 IMPLANT
GLOVE EUDERMIC 7 POWDERFREE (GLOVE) ×4 IMPLANT
GLOVE ORTHO TXT STRL SZ7.5 (GLOVE) ×4 IMPLANT
GOWN STRL REUS W/ TWL LRG LVL3 (GOWN DISPOSABLE) ×6 IMPLANT
GOWN STRL REUS W/ TWL XL LVL3 (GOWN DISPOSABLE) ×12 IMPLANT
GOWN STRL REUS W/TWL LRG LVL3 (GOWN DISPOSABLE) ×6
GOWN STRL REUS W/TWL XL LVL3 (GOWN DISPOSABLE) ×12
GUIDEWIRE SAF TJ AMPL .035X180 (WIRE) ×4 IMPLANT
GUIDEWIRE SAFE TJ AMPLATZ EXST (WIRE) ×4 IMPLANT
GUIDEWIRE STRAIGHT .035 260CM (WIRE) ×4 IMPLANT
GUIDEWIRE WHOLEY .035 145 JTIP (WIRE) ×4 IMPLANT
INSERT FOGARTY 61MM (MISCELLANEOUS) IMPLANT
INSERT FOGARTY SM (MISCELLANEOUS) IMPLANT
INSERT FOGARTY XLG (MISCELLANEOUS) IMPLANT
KIT BASIN OR (CUSTOM PROCEDURE TRAY) ×4 IMPLANT
KIT DILATOR VASC 18G NDL (KITS) IMPLANT
KIT HEART LEFT (KITS) ×4 IMPLANT
KIT ROOM TURNOVER OR (KITS) ×4 IMPLANT
KIT SUCTION CATH 14FR (SUCTIONS) ×8 IMPLANT
NEEDLE PERC 18GX7CM (NEEDLE) ×4 IMPLANT
NS IRRIG 1000ML POUR BTL (IV SOLUTION) ×12 IMPLANT
PACK AORTA (CUSTOM PROCEDURE TRAY) ×4 IMPLANT
PAD ARMBOARD 7.5X6 YLW CONV (MISCELLANEOUS) ×8 IMPLANT
PAD ELECT DEFIB RADIOL ZOLL (MISCELLANEOUS) ×4 IMPLANT
PATCH TACHOSII LRG 9.5X4.8 (VASCULAR PRODUCTS) IMPLANT
PERCLOSE PROGLIDE 6F (VASCULAR PRODUCTS)
SET MICROPUNCTURE 5F STIFF (MISCELLANEOUS) ×4 IMPLANT
SHEATH AVANTI 11CM 8FR (MISCELLANEOUS) ×4 IMPLANT
SHEATH PINNACLE 6F 10CM (SHEATH) ×8 IMPLANT
SLEEVE REPOSITIONING LENGTH 30 (MISCELLANEOUS) ×4 IMPLANT
SPONGE LAP 4X18 X RAY DECT (DISPOSABLE) ×4 IMPLANT
STOPCOCK MORSE 400PSI 3WAY (MISCELLANEOUS) ×28 IMPLANT
SUT ETHIBOND X763 2 0 SH 1 (SUTURE) IMPLANT
SUT GORETEX CV 4 TH 22 36 (SUTURE) IMPLANT
SUT GORETEX CV4 TH-18 (SUTURE) IMPLANT
SUT GORETEX TH-18 36 INCH (SUTURE) IMPLANT
SUT MNCRL AB 3-0 PS2 18 (SUTURE) IMPLANT
SUT PROLENE 3 0 SH1 36 (SUTURE) IMPLANT
SUT PROLENE 4 0 RB 1 (SUTURE)
SUT PROLENE 4-0 RB1 .5 CRCL 36 (SUTURE) IMPLANT
SUT PROLENE 5 0 C 1 36 (SUTURE) IMPLANT
SUT PROLENE 6 0 C 1 30 (SUTURE) IMPLANT
SUT SILK  1 MH (SUTURE) ×2
SUT SILK 1 MH (SUTURE) ×2 IMPLANT
SUT SILK 2 0 SH CR/8 (SUTURE) IMPLANT
SUT VIC AB 2-0 CT1 27 (SUTURE)
SUT VIC AB 2-0 CT1 TAPERPNT 27 (SUTURE) IMPLANT
SUT VIC AB 2-0 CTX 36 (SUTURE) IMPLANT
SUT VIC AB 3-0 SH 8-18 (SUTURE) IMPLANT
SYR 10ML LL (SYRINGE) ×12 IMPLANT
SYR 30ML LL (SYRINGE) ×8 IMPLANT
SYR 50ML LL SCALE MARK (SYRINGE) ×4 IMPLANT
TAPE CLOTH SURG 4X10 WHT LF (GAUZE/BANDAGES/DRESSINGS) ×8 IMPLANT
TOWEL OR 17X26 10 PK STRL BLUE (TOWEL DISPOSABLE) ×8 IMPLANT
TRANSDUCER W/STOPCOCK (MISCELLANEOUS) ×8 IMPLANT
TRAY FOLEY IC TEMP SENS 14FR (CATHETERS) ×4 IMPLANT
TUBE SUCT INTRACARD DLP 20F (MISCELLANEOUS) IMPLANT
TUBING HIGH PRESSURE 120CM (CONNECTOR) ×4 IMPLANT
VALVE HEART TRANSCATH SZ3 29MM (Prosthesis & Implant Heart) ×4 IMPLANT
WIRE AMPLATZ SS-J .035X180CM (WIRE) ×4 IMPLANT
WIRE BENTSON .035X145CM (WIRE) ×4 IMPLANT

## 2016-06-22 NOTE — Progress Notes (Signed)
ANTICOAGULATION CONSULT NOTE - Follow Up Consult  Pharmacy Consult for Heparin (Pradaxa on hold) Indication: atrial fibrillation  Assessment: 59 yoM on heparin for afib while Pradaxa on hold, in anticipation of TAVR.  Heparin restarted yesterday morning. Heparin level continues to be at goal, no bleeding issues noted overnight, cbc has remained stable.   TAVR planned for this morning.   Goal of Therapy:  Heparin level 0.3-0.7 units/ml Monitor platelets by anticoagulation protocol: Yes   Plan:  -Continue heparin at 1500 units/hr -Monitor heparin level daily, CBC, s/sx bleeding    Allergies  Allergen Reactions  . No Known Allergies    Patient Measurements: Height: 5\' 9"  (175.3 cm) Weight: 168 lb 12.8 oz (76.6 kg) IBW/kg (Calculated) : 70.7  Vital Signs: Temp: 98.4 F (36.9 C) (03/08 0559) Temp Source: Oral (03/08 0559) BP: 132/89 (03/08 0559) Pulse Rate: 117 (03/08 0559)  Labs:  Recent Labs  06/20/16 0258 06/21/16 0551 06/21/16 1544 06/22/16 0533  HGB 11.1* 11.5*  --  11.7*  HCT 33.1* 34.5*  --  35.0*  PLT 339 250  --  328  APTT  --   --   --  137*  LABPROT  --   --   --  15.7*  INR  --   --   --  1.24  HEPARINUNFRC 0.55 <0.10* 0.30 0.49  CREATININE  --  0.90  --  0.81   Estimated Creatinine Clearance: 67.9 mL/min (by C-G formula based on SCr of 0.81 mg/dL).   Erin Hearing PharmD., BCPS Clinical Pharmacist Pager [phone] 06/22/2016 8:20 AM

## 2016-06-22 NOTE — Progress Notes (Signed)
Physical Therapy Treatment Patient Details Name: Richard Simon MRN: [id number] DOB: September 24, 1929 Today's Date: 06/22/2016    History of Present Illness Pt is a 83 y.o. male with medical history significant of atrial fibrillation on Pradaxa, CAD, CABG, hyperlipidemia, hypertension, moderate to severe aortic stenosis is coming to the emergency department with complaints of on/off chest pressure, associated with dyspnea, orthopnea and fatigue. He was diagnosed with hyponatremia. PLan for TAVR 3/8.    PT Comments    Patient progressing slowly towards PT goals. Mobility limited today secondary to tachycardia and decrease in Sp02 to 79% on 2L/min 02. HR up to 156 bpm. Despite cues for pursed lip breathing, pt's Sp02 remained low. Needed seated rest break of 2 mins to recover. Pt going down for TAVR today. Will follow up post surgery.  Follow Up Recommendations  SNF     Equipment Recommendations  Rolling walker with 5" wheels    Recommendations for Other Services       Precautions / Restrictions Precautions Precautions: Fall Precaution Comments: watch HR, Sp02 Restrictions Weight Bearing Restrictions: No    Mobility  Bed Mobility Overal bed mobility: Needs Assistance Bed Mobility: Supine to Sit;Sit to Supine     Supine to sit: Min guard;HOB elevated Sit to supine: Min guard   General bed mobility comments: Increased time and effort but able to get to EOB without asist. Able to bring LEs into bed without assist.   Transfers Overall transfer level: Needs assistance Equipment used: Rolling walker (2 wheeled) Transfers: Sit to/from Stand   Stand pivot transfers: Min guard       General transfer comment: Min guard and multiple attempts to stand from EOB, stood from EOB x1, from toilet x1. + dizziness.  Ambulation/Gait Ambulation/Gait assistance: Min guard Ambulation Distance (Feet): 15 Feet (x2 bouts) Assistive device: Rolling walker (2 wheeled) Gait Pattern/deviations:  Step-through pattern;Decreased stride length;Wide base of support;Trunk flexed Gait velocity: decreased Gait velocity interpretation: Below normal speed for age/gender General Gait Details: Ambulated to/from bathroom with min guard assist. Sp02 dropped to 79% on 2L/min 02. Cues for pursed lip breathing. HR ranged from 115-156 bpm.    Stairs            Wheelchair Mobility    Modified Rankin (Stroke Patients Only)       Balance Overall balance assessment: Needs assistance Sitting-balance support: Feet supported;No upper extremity supported Sitting balance-Leahy Scale: Fair     Standing balance support: During functional activity Standing balance-Leahy Scale: Fair Standing balance comment: Able to stand and perform pericare wihtout 1 UE support.                    Cognition Arousal/Alertness: Awake/alert Behavior During Therapy: WFL for tasks assessed/performed Overall Cognitive Status: No family/caregiver present to determine baseline cognitive functioning Area of Impairment: Safety/judgement;Following commands       Following Commands: Follows multi-step commands with increased time Safety/Judgement: Decreased awareness of safety   Problem Solving: Requires verbal cues;Slow processing      Exercises      General Comments General comments (skin integrity, edema, etc.): Sp02 recovered with rest of ~2 mins to 90s.      Pertinent Vitals/Pain Pain Assessment: Faces Faces Pain Scale: Hurts little more Pain Location: headache Pain Descriptors / Indicators: Headache Pain Intervention(s): Monitored during session;Repositioned;Patient requesting pain meds-RN notified;Limited activity within patient's tolerance    Home Living  Prior Function            PT Goals (current goals can now be found in the care plan section) Progress towards PT goals: Progressing toward goals    Frequency    Min 3X/week      PT Plan Current  plan remains appropriate    Co-evaluation             End of Session Equipment Utilized During Treatment: Gait belt;Oxygen Activity Tolerance: Treatment limited secondary to medical complications (Comment) (tachycardia and Sp02) Patient left: in bed;with call bell/phone within reach;with nursing/sitter in room;with bed alarm set Nurse Communication: Mobility status PT Visit Diagnosis: Unsteadiness on feet (R26.81);Muscle weakness (generalized) (M62.81)     Time: 1040-1103 PT Time Calculation (min) (ACUTE ONLY): 23 min  Charges:  $Therapeutic Exercise: 8-22 mins $Therapeutic Activity: 8-22 mins                    G Codes:       Damichael Hofman A Ioana Louks 06/22/2016, 11:23 AM Wray Kearns, PT, DPT [phone]

## 2016-06-22 NOTE — Anesthesia Procedure Notes (Signed)
Central Venous Catheter Insertion Performed by: Oleta Mouse, anesthesiologist Start/End3/11/2016 1:09 PM, 06/22/2016 1:16 PM Patient location: Pre-op. Preanesthetic checklist: patient identified, IV checked, site marked, risks and benefits discussed, surgical consent, monitors and equipment checked, pre-op evaluation, timeout performed and anesthesia consent Position: Trendelenburg Lidocaine 1% used for infiltration and patient sedated Hand hygiene performed , maximum sterile barriers used  and Seldinger technique used Catheter size: 8 Fr Total catheter length 16. Central line was placed.Double lumen Procedure performed using ultrasound guided technique. Ultrasound Notes:anatomy identified, needle tip was noted to be adjacent to the nerve/plexus identified, no ultrasound evidence of intravascular and/or intraneural injection and image(s) printed for medical record Attempts: 1 Following insertion, dressing applied, line sutured and Biopatch. Post procedure assessment: blood return through all ports  Patient tolerated the procedure well with no immediate complications.

## 2016-06-22 NOTE — Anesthesia Preprocedure Evaluation (Addendum)
Anesthesia Evaluation  Patient identified by MRN, date of birth, ID band Patient awake    Reviewed: Allergy & Precautions, NPO status , Patient's Chart, lab work & pertinent test results, reviewed documented beta blocker date and time   History of Anesthesia Complications Negative for: history of anesthetic complications  Airway Mallampati: II  TM Distance: >3 FB Neck ROM: Full    Dental  (+) Teeth Intact, Missing, Dental Advisory Given   Pulmonary former smoker,    breath sounds clear to auscultation       Cardiovascular hypertension, Pt. on medications and Pt. on home beta blockers + CAD  + dysrhythmias Atrial Fibrillation + Valvular Problems/Murmurs AS  Rhythm:Irregular + Systolic murmurs    Neuro/Psych    GI/Hepatic negative GI ROS, Neg liver ROS,   Endo/Other  negative endocrine ROS  Renal/GU negative Renal ROS     Musculoskeletal   Abdominal   Peds  Hematology  (+) anemia ,   Anesthesia Other Findings   Reproductive/Obstetrics                            Anesthesia Physical Anesthesia Plan  ASA: IV  Anesthesia Plan: General   Post-op Pain Management:    Induction: Intravenous  Airway Management Planned: Oral ETT  Additional Equipment: Arterial line, CVP and TEE  Intra-op Plan: Utilization Of Total Body Hypothermia per surgeon request  Post-operative Plan: Extubation in OR  Informed Consent: I have reviewed the patients History and Physical, chart, labs and discussed the procedure including the risks, benefits and alternatives for the proposed anesthesia with the patient or authorized representative who has indicated his/her understanding and acceptance.   Dental advisory given  Plan Discussed with: CRNA, Anesthesiologist and Surgeon  Anesthesia Plan Comments:        Anesthesia Quick Evaluation

## 2016-06-22 NOTE — Anesthesia Procedure Notes (Signed)
Procedure Name: Intubation Date/Time: 06/22/2016 1:53 PM Performed by: Melina Copa, Margeaux Swantek R Pre-anesthesia Checklist: Patient identified, Emergency Drugs available, Suction available and Patient being monitored Patient Re-evaluated:Patient Re-evaluated prior to inductionOxygen Delivery Method: Circle System Utilized Preoxygenation: Pre-oxygenation with 100% oxygen Intubation Type: IV induction Ventilation: Mask ventilation without difficulty Laryngoscope Size: Mac and 4 Grade View: Grade I Tube type: Oral Tube size: 8.0 mm Number of attempts: 1 Airway Equipment and Method: Stylet Placement Confirmation: ETT inserted through vocal cords under direct vision,  positive ETCO2 and breath sounds checked- equal and bilateral Secured at: 21 cm Tube secured with: Tape Dental Injury: Teeth and Oropharynx as per pre-operative assessment

## 2016-06-22 NOTE — Progress Notes (Signed)
Progress Note  Patient Name: Richard Simon Date of Encounter: 06/22/2016  Primary Cardiologist: Ellyn Hack  Subjective   Feeling ok. No dyspnea or chest pain.   Inpatient Medications    Scheduled Meds: . aspirin EC  81 mg Oral Daily  . bacitracin  1 application Topical TID  . cefUROXime (ZINACEF)  IV  1.5 g Intravenous To OR  . chlorhexidine      . chlorhexidine  15 mL Mouth/Throat BID  . Chlorhexidine Gluconate Cloth  6 each Topical Once  . dexmedetomidine  0.1-0.7 mcg/kg/hr Intravenous To OR  . diltiazem  240 mg Oral Daily  . docusate sodium  100 mg Oral BID  . DOPamine  0-10 mcg/kg/min Intravenous To OR  . epinephrine  0-10 mcg/min Intravenous To OR  . feeding supplement (ENSURE ENLIVE)  237 mL Oral TID BM  . heparin 30,000 units/NS 1000 mL solution for CELLSAVER   Other To OR  . hydrocortisone cream   Topical TID  . insulin (NOVOLIN-R) infusion   Intravenous To OR  . magnesium sulfate  40 mEq Other To OR  . metoprolol tartrate  50 mg Oral BID  . nitroGLYCERIN  2-200 mcg/min Intravenous To OR  . norepinephrine (LEVOPHED) Adult infusion  0-10 mcg/min Intravenous To OR  . phenylephrine 20mg /243mL NS (0.08mg /ml) infusion  30-200 mcg/min Intravenous To OR  . potassium chloride  80 mEq Other To OR  . rosuvastatin  10 mg Oral Daily  . sodium chloride flush  3 mL Intravenous Q12H  . sodium chloride flush  3 mL Intravenous Q12H  . vancomycin  1,250 mg Intravenous To OR   Continuous Infusions: . heparin 1,500 Units/hr (06/21/16 0651)  . lactated ringers 50 mL/hr at 06/20/16 0853  . lactated ringers 10 mL/hr at 06/20/16 1412   PRN Meds: sodium chloride, acetaminophen, ALPRAZolam, guaiFENesin-dextromethorphan, HYDROcodone-acetaminophen, levalbuterol, magnesium hydroxide, morphine injection, ondansetron **OR** ondansetron (ZOFRAN) IV, sodium chloride flush, witch hazel-glycerin   Vital Signs    Vitals:   06/21/16 1024 06/21/16 1456 06/21/16 2136 06/22/16 0559  BP: 107/70 (!)  100/59 139/88 132/89  Pulse: (!) 114 89 95 (!) 117  Resp:  (!) 23 10 15   Temp:  98.5 F (36.9 C) 98.5 F (36.9 C) 98.4 F (36.9 C)  TempSrc:  Oral  Oral  SpO2:  95% 100% 99%  Weight:    168 lb 12.8 oz (76.6 kg)  Height:        Intake/Output Summary (Last 24 hours) at 06/22/16 1149 Last data filed at 06/22/16 0400  Gross per 24 hour  Intake           917.25 ml  Output              350 ml  Net           567.25 ml   Filed Weights   06/20/16 0500 06/21/16 0500 06/22/16 0559  Weight: 163 lb 9.6 oz (74.2 kg) 166 lb 4.8 oz (75.4 kg) 168 lb 12.8 oz (76.6 kg)    Telemetry    Atrial fibrillation HR 110-125 bpm - Personally Reviewed   Physical Exam  Alert, oriented, elderly male in NAD GEN: No acute distress.   Neck: No JVD Cardiac: irregularly irregular with 4/6 harsh systolic murmur at the RUSB Respiratory: Clear to auscultation bilaterally. GI: Soft, nontender, non-distended  MS: No edema; No deformity. Neuro:  Nonfocal  Psych: Normal affect   Labs    Chemistry Recent Labs Lab 06/19/16 [phone] 06/21/16 7342 06/22/16 0533  NA 135 134* 134*  K 4.0 4.2 4.1  CL 101 99* 97*  CO2 29 26 28   GLUCOSE 104* 98 106*  BUN 5* 10 9  CREATININE 0.71 0.90 0.81  CALCIUM 9.0 9.2 9.1  GFRNONAA >60 >60 >60  GFRAA >60 >60 >60  ANIONGAP 5 9 9      Hematology Recent Labs Lab 06/20/16 0258 06/21/16 0551 06/22/16 0533  WBC 8.2 9.3 11.8*  RBC 3.51* 3.63* 3.70*  HGB 11.1* 11.5* 11.7*  HCT 33.1* 34.5* 35.0*  MCV 94.3 95.0 94.6  MCH 31.6 31.7 31.6  MCHC 33.5 33.3 33.4  RDW 12.7 12.6 12.8  PLT 339 250 328    Cardiac EnzymesNo results for input(s): TROPONINI in the last 168 hours. No results for input(s): TROPIPOC in the last 168 hours.   BNPNo results for input(s): BNP, PROBNP in the last 168 hours.   DDimer No results for input(s): DDIMER in the last 168 hours.   Radiology    No results found.   Patient Profile     83 y.o. male with critical aortic stenosis and heart  failure  Assessment & Plan  1.Acute on chronic combined systolic and diastolic heart failure: multifactorial, critical AS primary issue.   2. Permanent atrial fibrillation: changed to metoprolol yesterday, HR still elevated after switching to metoprolol yesterday. Will continue to titrate after TAVR.  3. Critical aortic stenosis: pt's family in town. I talked to them at length this morning about expectations, risks, and alternatives to TAVR scheduled this afternoon. All questions are answered.   Deatra James, MD  06/22/2016, 11:49 AM

## 2016-06-22 NOTE — H&P (View-Only) (Signed)
Progress Note  Patient Name: Richard Simon Date of Encounter: 06/22/2016  Primary Cardiologist: Ellyn Hack  Subjective   Feeling ok. No dyspnea or chest pain.   Inpatient Medications    Scheduled Meds: . aspirin EC  81 mg Oral Daily  . bacitracin  1 application Topical TID  . cefUROXime (ZINACEF)  IV  1.5 g Intravenous To OR  . chlorhexidine      . chlorhexidine  15 mL Mouth/Throat BID  . Chlorhexidine Gluconate Cloth  6 each Topical Once  . dexmedetomidine  0.1-0.7 mcg/kg/hr Intravenous To OR  . diltiazem  240 mg Oral Daily  . docusate sodium  100 mg Oral BID  . DOPamine  0-10 mcg/kg/min Intravenous To OR  . epinephrine  0-10 mcg/min Intravenous To OR  . feeding supplement (ENSURE ENLIVE)  237 mL Oral TID BM  . heparin 30,000 units/NS 1000 mL solution for CELLSAVER   Other To OR  . hydrocortisone cream   Topical TID  . insulin (NOVOLIN-R) infusion   Intravenous To OR  . magnesium sulfate  40 mEq Other To OR  . metoprolol tartrate  50 mg Oral BID  . nitroGLYCERIN  2-200 mcg/min Intravenous To OR  . norepinephrine (LEVOPHED) Adult infusion  0-10 mcg/min Intravenous To OR  . phenylephrine 20mg /273mL NS (0.08mg /ml) infusion  30-200 mcg/min Intravenous To OR  . potassium chloride  80 mEq Other To OR  . rosuvastatin  10 mg Oral Daily  . sodium chloride flush  3 mL Intravenous Q12H  . sodium chloride flush  3 mL Intravenous Q12H  . vancomycin  1,250 mg Intravenous To OR   Continuous Infusions: . heparin 1,500 Units/hr (06/21/16 0651)  . lactated ringers 50 mL/hr at 06/20/16 0853  . lactated ringers 10 mL/hr at 06/20/16 1412   PRN Meds: sodium chloride, acetaminophen, ALPRAZolam, guaiFENesin-dextromethorphan, HYDROcodone-acetaminophen, levalbuterol, magnesium hydroxide, morphine injection, ondansetron **OR** ondansetron (ZOFRAN) IV, sodium chloride flush, witch hazel-glycerin   Vital Signs    Vitals:   06/21/16 1024 06/21/16 1456 06/21/16 2136 06/22/16 0559  BP: 107/70 (!)  100/59 139/88 132/89  Pulse: (!) 114 89 95 (!) 117  Resp:  (!) 23 10 15   Temp:  98.5 F (36.9 C) 98.5 F (36.9 C) 98.4 F (36.9 C)  TempSrc:  Oral  Oral  SpO2:  95% 100% 99%  Weight:    168 lb 12.8 oz (76.6 kg)  Height:        Intake/Output Summary (Last 24 hours) at 06/22/16 1149 Last data filed at 06/22/16 0400  Gross per 24 hour  Intake           917.25 ml  Output              350 ml  Net           567.25 ml   Filed Weights   06/20/16 0500 06/21/16 0500 06/22/16 0559  Weight: 163 lb 9.6 oz (74.2 kg) 166 lb 4.8 oz (75.4 kg) 168 lb 12.8 oz (76.6 kg)    Telemetry    Atrial fibrillation HR 110-125 bpm - Personally Reviewed   Physical Exam  Alert, oriented, elderly male in NAD GEN: No acute distress.   Neck: No JVD Cardiac: irregularly irregular with 4/6 harsh systolic murmur at the RUSB Respiratory: Clear to auscultation bilaterally. GI: Soft, nontender, non-distended  MS: No edema; No deformity. Neuro:  Nonfocal  Psych: Normal affect   Labs    Chemistry Recent Labs Lab 06/19/16 [phone] 06/21/16 9678 06/22/16 0533  NA 135 134* 134*  K 4.0 4.2 4.1  CL 101 99* 97*  CO2 29 26 28   GLUCOSE 104* 98 106*  BUN 5* 10 9  CREATININE 0.71 0.90 0.81  CALCIUM 9.0 9.2 9.1  GFRNONAA >60 >60 >60  GFRAA >60 >60 >60  ANIONGAP 5 9 9      Hematology Recent Labs Lab 06/20/16 0258 06/21/16 0551 06/22/16 0533  WBC 8.2 9.3 11.8*  RBC 3.51* 3.63* 3.70*  HGB 11.1* 11.5* 11.7*  HCT 33.1* 34.5* 35.0*  MCV 94.3 95.0 94.6  MCH 31.6 31.7 31.6  MCHC 33.5 33.3 33.4  RDW 12.7 12.6 12.8  PLT 339 250 328    Cardiac EnzymesNo results for input(s): TROPONINI in the last 168 hours. No results for input(s): TROPIPOC in the last 168 hours.   BNPNo results for input(s): BNP, PROBNP in the last 168 hours.   DDimer No results for input(s): DDIMER in the last 168 hours.   Radiology    No results found.   Patient Profile     83 y.o. male with critical aortic stenosis and heart  failure  Assessment & Plan  1.Acute on chronic combined systolic and diastolic heart failure: multifactorial, critical AS primary issue.   2. Permanent atrial fibrillation: changed to metoprolol yesterday, HR still elevated after switching to metoprolol yesterday. Will continue to titrate after TAVR.  3. Critical aortic stenosis: pt's family in town. I talked to them at length this morning about expectations, risks, and alternatives to TAVR scheduled this afternoon. All questions are answered.   Deatra James, MD  06/22/2016, 11:49 AM

## 2016-06-22 NOTE — Op Note (Signed)
[facility]   MULTIDISCIPLINARY HEART VALVE TEAM   TAVR OPERATIVE NOTE   Date of Procedure:  06/22/2016  Preoperative Diagnosis: Critical Aortic Stenosis   Postoperative Diagnosis: Same   Procedure:    Transcatheter Aortic Valve Replacement - Percutaneous Left Transfemoral Approach  Edwards Sapien 3 THV (size 29 mm, model # 9600TFX, serial # [id number])   Co-Surgeons:                       Gaye Pollack, MD and Sherren Mocha, MD   Anesthesiologist:  Laurie Panda, MD  Echocardiographer:  Jenkins Rouge, MD  Pre-operative Echo Findings:  Critical aortic stenosis  Moderate AI  Mild left ventricular systolic dysfunction   Post-operative Echo Findings:  Trivial paravalvular leak  Unchanged mild left ventricular systolic dysfunction  Mean gradient 4 mm Hg.  BRIEF CLINICAL NOTE AND INDICATIONS FOR SURGERY  The patient is an 83 year old gentleman with permanent atrial fibrillation on Pradaxa, hypertension, dyslipidemia, CAD s/p CABG by Dr. Servando Snare in 2004, and known aortic stenosis who was admitted 06/07/2016 with acute on chronic combined systolic and diastolic heart failure. He had progressive exertional shortness of breath, orthopnea and exertional fatigue and says that he developed dizziness and felt like he was going to pass out. He called his son and then called 911. He says that he knew something bad was happening. On admission his BNP was 277 and troponin was negative. His CXR showed hazy opacities within the upper lobes suspicious for an infectious or inflammatory process. A 2D echo showed severe AS with a mean gradient of 52 mm Hg and an AVA of 0.3 cm2. The LVEF was 45-50%. His last echo in 07/2015 had shown a mean gradient of 43 mm Hg with a peak velocity of 451 cm/s consistent with severe AS and there was moderate AI. His EF was 55-60% at that time. He was apparently not having any symptoms at that time and did not return until this admission. Cath now shows  severe native vessel CAD with patent bypass grafts. PA pressure was 48/23/33. PA sat 58%. CI 2.2 by TD and 2.54 by Fick. Carotid dopplers show no significant stenosis.   This 83 year old gentleman has stage D severe, symptomatic aortic stenosis with acute on chronic combined systolic and diastolic heart failure, NYHA class III presenting with exertional fatigue, shortness of breath and pre-syncope. I have personally reviewed his echo, cath and CTA images. His echo shows a severely calcified trileaflet aortic valve with severely restricted leaflet mobility. The mean gradient has increased from 43 last year to 54 mm Hg now consistent with severe AS. His LV systolic function has decreased compared to last year. His cath shows patent grafts and no significant ischemic territory. I think AVR is indicated in this patient who is 4 but has been relatively active until just recently. I think he would be at high risk for open surgical AVR given his age and the redo nature of his surgery, marked deconditioning and poor mobility as not in his PT evaluation. TAVR would be a much better alternative for him. His gated cardiac CT shows anatomy favorable for TAVR using a 29 mm Sapien 3 valve. His abdominal and pelvic CT shows adequate pelvic arterial anatomy to allow a transfemoral insertion. He has poor dentition and was evaluated by dentistry and underwent multiple extractions preop.    Following the decision to proceed with transcatheter aortic valve replacement, a discussion has been held  regarding what types of management strategies would be attempted intraoperatively in the event of life-threatening complications, including whether or not the patient would be considered a candidate for the use of cardiopulmonary bypass and/or conversion to open sternotomy for attempted surgical intervention.  The patient has been advised of a variety of complications that might develop peculiar to this approach including but not limited  to risks of death, stroke, paravalvular leak, aortic dissection or other major vascular complications, aortic annulus rupture, device embolization, cardiac rupture or perforation, acute myocardial infarction, arrhythmia, heart block or bradycardia requiring permanent pacemaker placement, congestive heart failure, respiratory failure, renal failure, pneumonia, infection, other late complications related to structural valve deterioration or migration, or other complications that might ultimately cause a temporary or permanent loss of functional independence or other long term morbidity.  The patient provides full informed consent for the procedure as described and all questions were answered preoperatively.    DETAILS OF THE OPERATIVE PROCEDURE  PREPARATION:    The patient is brought to the operating room on the above mentioned date and central monitoring was established by the anesthesia team including placement of Swan-Ganz catheter and radial arterial line. The patient is placed in the supine position on the operating table.  Intravenous antibiotics are administered. General endotracheal anesthesia is induced uneventfully.  A Foley catheter is placed.  Baseline transesophageal echocardiogram was performed. The patient's chest, abdomen, both groins, and both lower extremities are prepared and draped in a sterile manner. A time out procedure is performed.   Peripheral and transfemoral access was performed by Dr. Burt Knack and will be included in his note.  BALLOON AORTIC VALVULOPLASTY:   Not performed   TRANSCATHETER HEART VALVE DEPLOYMENT:   An Edwards Sapien 3 transcatheter heart valve (size 29 mm, model #9600TFX, serial #[id number]) was prepared and crimped per manufacturer's guidelines, and the proper orientation of the valve is confirmed on the [employer] delivery system. The valve was advanced through the introducer sheath using normal technique until in an appropriate position in the  abdominal aorta beyond the sheath tip. The balloon was then retracted and using the fine-tuning wheel was centered on the valve. The valve was then advanced across the aortic arch using appropriate flexion of the catheter. The valve was carefully positioned across the aortic valve annulus. The Commander catheter was retracted using normal technique. Once final position of the valve has been confirmed by angiographic assessment, the valve is deployed while temporarily holding ventilation and during rapid ventricular pacing to maintain systolic blood pressure < 50 mmHg and pulse pressure < 10 mmHg. The balloon inflation is held for >3 seconds after reaching full deployment volume. Once the balloon has fully deflated the balloon is retracted into the ascending aorta and valve function is assessed using echocardiography. There is felt to be trivial paravalvular leak and no central aortic insufficiency.  The patient's hemodynamic recovery following valve deployment is good.  The deployment balloon and guidewire are both removed. Echo demostrated acceptable post-procedural gradients, stable mitral valve function, and trivial aortic insufficiency.    PROCEDURE COMPLETION:   The sheath was removed and femoral artery closure performed by Dr Burt Knack. Please see his separate report for details.  Protamine was administered once femoral arterial repair was complete. The temporary pacemaker, pigtail catheters and femoral sheaths were removed with manual pressure used for hemostasis.   The patient tolerated the procedure well and is transported to the surgical intensive care in stable condition. There were no immediate intraoperative complications. All sponge  instrument and needle counts are verified correct at completion of the operation.   No blood products were administered during the operation.  The patient received a total of 63 mL of intravenous contrast during the procedure.   Gaye Pollack, MD 06/22/2016 5:35  PM

## 2016-06-22 NOTE — Op Note (Signed)
[facility]   MULTIDISCIPLINARY HEART VALVE TEAM   TAVR OPERATIVE NOTE   Date of Procedure:  06/22/2016  Preoperative Diagnosis: Severe Aortic Stenosis   Postoperative Diagnosis: Same   Procedure:    Transcatheter Aortic Valve Replacement - Percutaneous  Transfemoral Approach  Edwards Sapien 3 THV (size 29 mm, model # 9600TFX, serial # [id number])   Co-Surgeons:  Gaye Pollack, MD and Sherren Mocha, MD  Anesthesiologist:  Laurie Panda, MD  Echocardiographer:  Jenkins Rouge, ND  Pre-operative Echo Findings:  Critical aortic stenosis  Mild left ventricular systolic dysfunction  Post-operative Echo Findings:  trivial paravalvular leak  No change in left ventricular systolic function  BRIEF CLINICAL NOTE AND INDICATIONS FOR SURGERY 83 year old gentleman with previous CABG and progressive aortic stenosis. He presents with congestive heart failure. He is found to have critical aortic stenosis and patency of his bypass grafts. Because of his critical aortic stenosis, congestive heart failure, atrial fibrillation with difficult to control heart rate, and other comorbid conditions, he remains in the hospital and presents today for TAVR. He underwent dental extraction approximately 48 hours ago and tolerated this well.  During the course of the patient's preoperative work up they have been evaluated comprehensively by a multidisciplinary team of specialists coordinated through the [facility] in the [facility].  They have been demonstrated to suffer from symptomatic severe aortic stenosis as noted above. The patient has been counseled extensively as to the relative risks and benefits of all options for the treatment of severe aortic stenosis including long term medical therapy, conventional surgery for aortic valve replacement, and transcatheter aortic valve replacement.  The patient has been independently evaluated by two  cardiac surgeons including Dr. Cyndia Bent and Dr. Servando Snare, and they are felt to be at high risk for conventional surgical aortic valve replacement. Both surgeons indicated the patient would be a poor candidate for conventional surgery (predicted risk of mortality >15% and/or predicted risk of permanent morbidity >50%) because of comorbidities including previous CABG, Class IV CHF, atrial fibrillation, LV dysfunction.   Based upon review of all of the patient's preoperative diagnostic tests they are felt to be candidate for transcatheter aortic valve replacement using the transfemoral approach as an alternative to high risk conventional surgery.    Following the decision to proceed with transcatheter aortic valve replacement, a discussion has been held regarding what types of management strategies would be attempted intraoperatively in the event of life-threatening complications, including whether or not the patient would be considered a candidate for the use of cardiopulmonary bypass and/or conversion to open sternotomy for attempted surgical intervention.  The patient has been advised of a variety of complications that might develop peculiar to this approach including but not limited to risks of death, stroke, paravalvular leak, aortic dissection or other major vascular complications, aortic annulus rupture, device embolization, cardiac rupture or perforation, acute myocardial infarction, arrhythmia, heart block or bradycardia requiring permanent pacemaker placement, congestive heart failure, respiratory failure, renal failure, pneumonia, infection, other late complications related to structural valve deterioration or migration, or other complications that might ultimately cause a temporary or permanent loss of functional independence or other long term morbidity.  The patient provides full informed consent for the procedure as described and all questions were answered preoperatively.  DETAILS OF THE OPERATIVE  PROCEDURE  PREPARATION:   The patient is brought to the operating room on the above mentioned date and central monitoring was established by the anesthesia team  including placement of Swan-Ganz catheter and radial arterial line. The patient is placed in the supine position on the operating table.  Intravenous antibiotics are administered. General endotracheal anesthesia is induced uneventfully.  A Foley catheter is placed.  Baseline transesophageal echocardiogram was performed. The patient's chest, abdomen, both groins, and both lower extremities are prepared and draped in a sterile manner. A time out procedure is performed.   PERIPHERAL ACCESS:   Using the modified Seldinger technique, femoral arterial and venous access was obtained with placement of 6 Fr sheaths on the right side.  A pigtail diagnostic catheter was passed through the right femoral arterial sheath under fluoroscopic guidance into the aortic root.  A temporary transvenous pacemaker catheter was passed through the right femoral venous sheath under fluoroscopic guidance into the right ventricle.  The pacemaker was tested to ensure stable lead placement and pacemaker capture. Aortic root angiography was performed in order to determine the optimal angiographic angle for valve deployment.   TRANSFEMORAL ACCESS:  A micropuncture technique is used to access the left femoral artery under fluoroscopic guidance.  Femoral angiography is performed to verify access in the common femoral artery. 2 Perclose devices are deployed at 10' and 2' positions to 'PreClose' the femoral artery. An 8 French sheath is placed and then an Amplatz Superstiff wire is advanced through the sheath. This is changed out for a 16 French transfemoral E-Sheath after progressively dilating over the Superstiff wire.  An AL-2 catheter was used to direct a straight-tip exchange length wire across the native aortic valve into the left ventricle. This was exchanged out for a  pigtail catheter and position was confirmed in the LV apex. Simultaneous LV and Ao pressures were recorded.  The pigtail catheter was exchanged for an Amplatz Extra-stiff wire in the LV apex.  Echocardiography was utilized to confirm appropriate wire position and no sign of entanglement in the mitral subvalvular apparatus.  TRANSCATHETER HEART VALVE DEPLOYMENT:  An Edwards Sapien 3 transcatheter heart valve (size 29 mm, model #9600TFX, serial #[id number]) was prepared and crimped per manufacturer's guidelines, and the proper orientation of the valve is confirmed on the [employer] delivery system. The valve was advanced through the introducer sheath using normal technique until in an appropriate position in the abdominal aorta beyond the sheath tip. The balloon was then retracted and using the fine-tuning wheel was centered on the valve. The valve was then advanced across the aortic arch using appropriate flexion of the catheter. The valve was carefully positioned across the aortic valve annulus. The Commander catheter was retracted using normal technique. Once final position of the valve has been confirmed by angiographic assessment, the valve is deployed while temporarily holding ventilation and during rapid ventricular pacing to maintain systolic blood pressure < 50 mmHg and pulse pressure < 10 mmHg. The balloon inflation is held for >3 seconds after reaching full deployment volume. Once the balloon has fully deflated the balloon is retracted into the ascending aorta and valve function is assessed using echocardiography. There is felt to be trivial paravalvular leak and no central aortic insufficiency.  The patient's hemodynamic recovery following valve deployment is good.  The deployment balloon and guidewire are both removed. Echo demostrated acceptable post-procedural gradients, stable mitral valve function, and trivial aortic insufficiency.  PROCEDURE COMPLETION:  The sheath was removed and femoral  artery closure is performed using the 2 previously deployed Perclose devices.  Protamine was administered once femoral arterial repair was complete. The temporary pacemaker, pigtail catheters and femoral sheaths were  removed with manual pressure used for hemostasis.   The patient tolerated the procedure well and is transported to the surgical intensive care in stable condition. There were no immediate intraoperative complications. All sponge instrument and needle counts are verified correct at completion of the operation.   The patient received a total of 63 mL of intravenous contrast during the procedure.   Sherren Mocha, MD 06/22/2016 4:37 PM

## 2016-06-22 NOTE — Progress Notes (Signed)
  Echocardiogram Echocardiogram Transesophageal has been performed.  Richard Simon M 06/22/2016, 3:30 PM

## 2016-06-22 NOTE — Interval H&P Note (Signed)
History and Physical Interval Note:  06/22/2016 12:31 PM  Richard Simon  has presented today for surgery, with the diagnosis of SEVERE AS  The various methods of treatment have been discussed with the patient and family. After consideration of risks, benefits and other options for treatment, the patient has consented to  Procedure(s): TRANSCATHETER AORTIC VALVE REPLACEMENT, TRANSFEMORAL (N/A) TRANSESOPHAGEAL ECHOCARDIOGRAM (TEE) (N/A) as a surgical intervention .  The patient's history has been reviewed, patient examined, no change in status, stable for surgery.  I have reviewed the patient's chart and labs.  Questions were answered to the patient's satisfaction.    All imaging studies reviewed. Plan transfemoral TAVR with a 29 mm S3 valve today.   Sherren Mocha

## 2016-06-22 NOTE — Progress Notes (Addendum)
PROGRESS NOTE    Richard Simon  RWE:[id number] DOB: [birth year] DOA: 06/07/2016 PCP: Merrilee Seashore, MD   Chief Complaint  Patient presents with  . Chest Pain  . Shortness of Breath    Brief Narrative:  83 y.o.malewith medical history significant of atrial fibrillation on Pradaxa, CAD, CABG, hyperlipidemia, hypertension, moderate to severe aortic stenosis is coming to the emergency department with complaints of on/off chest pressure, radiates to his back, improved by rest for the past week associated with dyspnea, orthopnea and fatigue.  He was found to be hyponatremic in the ED.  He was felt to be dehydrated, and received gentle IV hydration, which resulted in improvement in his sodium however he developed fluid overload quite rapidly.  2D echo later showed critical aortic stenosis and cardiology was consulted, patient was started on IV Lasix, his sodium has improved. CTS has determined that pt needs TAVR and to be done prior to discharge. Pt needs to have dental extraction, Dr. Orene Desanctis. Consulted and this is to be done today. Suspect that pt can undergo TAVR ~ 2 days post dental extraction.  Assessment & Plan   Chest pain -Echocardiogram done on 2/22 showed an ejection fraction of 45-50%, with mildly reduced systolic function, with diffuse hypokinesis and critical aortic valve stenosis.  Valve area was calculated at 0.26 cm.  Prior to the echo done in April 2017 showed normal EF of 61-95%, normal systolic function, with a valve area of 0.55 -per cardiology may need TAVR -no chest pain this AM  -appreciate cardiothoracic surgery assistance, plan for TAVR once dental extraction completed  -S/p dental extractions on 3/6  Stage D severe and symptomatic AS with Acute on chronic combined diastolic and systolic CHF, NYHA class III -echo shows a severely calcified trileaflet aortic valve with severely restricted leaflet mobility -LV systolic function has decreased compared to last year -cath  shows patent grafts and no significant ischemic territory -per vascular surgery, TAVR to be considered but vascular surgery recommends that pt is clear from pulmonary stand point  -pt taken to OR for dental extraction and full mouth debridement of remaining dentition -PCCM cleared for surgery with close monitoring in SDU vs ICU post op- recommended Post op care with BDs, adequate analgesia, avoid oversedation, incentive spirometry and pulm hygiene -pending TAVR today  CAP -Completed 7 days therapy with ABX but opacities still seen on imaging studies  -will ask PCCM to review and provide pre op clearance for pt  -no fevers and WBC is now WNL  Hyponatremia -Patient was initially hypovolemic and his sodium improved with gentle hydration, however he developed fluid overload rather rapidly due to his critical aortic stenosis, with worsening hyponatremia.   -He required IV Lasix, with improvement in his respiratory status as well as improvement in his sodium levels. -Sodium 134 today (was 119 upon admission) -Appears to have chronic hyponatremia (125-128 in 2010)  Plaque-like irregularity along the posterior aspect of the urinary bladder wall -adjacent to a small filling defect which may have an attachment to the wall of the urinary bladder -findings are concerning for potential urothelial neoplasm, and follow-up nonemergent evaluation by Urology is suggested in the near future to better evaluate these findings -when pt ready for discharge will set up follow up appointment   Acute encephalopathy -Patient with intermittent confusion, this is likely in the setting of infectious process, as well as probable delirium related to inpatient stay -appears to be stable at baseline   CAD (coronary artery disease) -Continue home medications, cardiology  following   Essential hypertension -reasonably stable for now   Dyslipidemia, goal LDL below 70 -Continue Crestor  Atrial fibrillation,  permanent (HCC) -CHA2DS2-VASc Score of at least 4. -Continue atenolol for rate control, on Heparin drip for now  -per cardiology, added digoxin on 3/4 -HR now in low 100's   Severe malnutrition -In the context of chronic illness -nutrition consulted and recommended supplements  DVT Prophylaxis  SCDs, Heparin   Code Status: Full  Family Communication: None at bedside  Disposition Plan: Admitted.  Possibly surgery 3/8.   Consultants Cardiology Cardiothoracic surgery PCCM  Dentistry, Dr. Enrique Sack  Procedures  Echocardiogram Multiple tooth extractions (5,40,08,67) with alveoloplasty, full mouth debridement of remaining dentition   Antibiotics   Anti-infectives    Start     Dose/Rate Route Frequency Ordered Stop   06/22/16 0400  vancomycin (VANCOCIN) 1,250 mg in sodium chloride 0.9 % 250 mL IVPB     1,250 mg 166.7 mL/hr over 90 Minutes Intravenous To Surgery 06/21/16 1332 06/23/16 0400   06/22/16 0400  cefUROXime (ZINACEF) 1.5 g in dextrose 5 % 50 mL IVPB     1.5 g 100 mL/hr over 30 Minutes Intravenous To Surgery 06/21/16 1332 06/23/16 0400   06/20/16 0600  ceFAZolin (ANCEF) IVPB 2g/100 mL premix     2 g 200 mL/hr over 30 Minutes Intravenous  Once 06/19/16 1604 06/20/16 0635   06/20/16 0400  vancomycin (VANCOCIN) 1,250 mg in sodium chloride 0.9 % 250 mL IVPB  Status:  Discontinued     1,250 mg 166.7 mL/hr over 90 Minutes Intravenous To Surgery 06/19/16 1047 06/19/16 1522   06/20/16 0400  cefUROXime (ZINACEF) 1.5 g in dextrose 5 % 50 mL IVPB  Status:  Discontinued     1.5 g 100 mL/hr over 30 Minutes Intravenous To Surgery 06/19/16 1047 06/19/16 1522   06/08/16 0500  azithromycin (ZITHROMAX) 500 mg in dextrose 5 % 250 mL IVPB     500 mg 250 mL/hr over 60 Minutes Intravenous Every 24 hours 06/07/16 0240 06/14/16 0611   06/08/16 0300  cefTRIAXone (ROCEPHIN) 1 g in dextrose 5 % 50 mL IVPB     1 g 100 mL/hr over 30 Minutes Intravenous Every 24 hours 06/07/16 0240 06/14/16 0408    06/07/16 0215  cefTRIAXone (ROCEPHIN) 1 g in dextrose 5 % 50 mL IVPB     1 g 100 mL/hr over 30 Minutes Intravenous  Once 06/07/16 0212 06/07/16 0330   06/07/16 0215  azithromycin (ZITHROMAX) 500 mg in dextrose 5 % 250 mL IVPB     500 mg 250 mL/hr over 60 Minutes Intravenous  Once 06/07/16 6195 06/07/16 0601      Subjective:   Richard Simon seen and examined today. Patient complains of bleeding from his recent dental surgery.  Denies chest pain, shortness of breath, abdominal pain, N/V/D/C. Wants to get his surgery done and over with.   Objective:   Vitals:   06/21/16 1024 06/21/16 1456 06/21/16 2136 06/22/16 0559  BP: 107/70 (!) 100/59 139/88 132/89  Pulse: (!) 114 89 95 (!) 117  Resp:  (!) 23 10 15   Temp:  98.5 F (36.9 C) 98.5 F (36.9 C) 98.4 F (36.9 C)  TempSrc:  Oral  Oral  SpO2:  95% 100% 99%  Weight:    76.6 kg (168 lb 12.8 oz)  Height:        Intake/Output Summary (Last 24 hours) at 06/22/16 1132 Last data filed at 06/22/16 0400  Gross per 24 hour  Intake  917.25 ml  Output              350 ml  Net           567.25 ml   Filed Weights   06/20/16 0500 06/21/16 0500 06/22/16 0559  Weight: 74.2 kg (163 lb 9.6 oz) 75.4 kg (166 lb 4.8 oz) 76.6 kg (168 lb 12.8 oz)    Exam  General: Well developed, well nourished, NAD, appears stated age  HEENT: NCAT, mucous membranes dry- dried blood on lips  Neck: Supple, no JVD  Cardiovascular: S1 S2 auscultated, 3/6 SEM, tachycardic.  Respiratory: Clear to auscultation bilaterally with equal chest rise  Abdomen: Soft, nontender, nondistended, + bowel sounds  Extremities: warm dry without cyanosis clubbing or edema  Neuro: AAOx3, nonfocal  Psych: Normal affect and demeanor    Data Reviewed: I have personally reviewed following labs and imaging studies  CBC:  Recent Labs Lab 06/18/16 0342 06/19/16 0409 06/20/16 0258 06/21/16 0551 06/22/16 0533  WBC 9.3 8.4 8.2 9.3 11.8*  NEUTROABS 6.4 6.0 5.4 7.3  9.2*  HGB 11.5* 11.0* 11.1* 11.5* 11.7*  HCT 33.5* 33.3* 33.1* 34.5* 35.0*  MCV 94.1 94.6 94.3 95.0 94.6  PLT 367 357 339 250 254   Basic Metabolic Panel:  Recent Labs Lab 06/17/16 0350 06/18/16 0342 06/19/16 0409 06/21/16 0551 06/22/16 0533  NA 134* 132* 135 134* 134*  K 3.8 3.8 4.0 4.2 4.1  CL 98* 97* 101 99* 97*  CO2 27 24 29 26 28   GLUCOSE 99 108* 104* 98 106*  BUN 10 8 5* 10 9  CREATININE 0.73 0.70 0.71 0.90 0.81  CALCIUM 9.0 8.9 9.0 9.2 9.1   GFR: Estimated Creatinine Clearance: 67.9 mL/min (by C-G formula based on SCr of 0.81 mg/dL). Liver Function Tests: No results for input(s): AST, ALT, ALKPHOS, BILITOT, PROT, ALBUMIN in the last 168 hours. No results for input(s): LIPASE, AMYLASE in the last 168 hours. No results for input(s): AMMONIA in the last 168 hours. Coagulation Profile:  Recent Labs Lab 06/22/16 0533  INR 1.24   Cardiac Enzymes: No results for input(s): CKTOTAL, CKMB, CKMBINDEX, TROPONINI in the last 168 hours. BNP (last 3 results) No results for input(s): PROBNP in the last 8760 hours. HbA1C: No results for input(s): HGBA1C in the last 72 hours. CBG:  Recent Labs Lab 06/20/16 1253  GLUCAP 100*   Lipid Profile: No results for input(s): CHOL, HDL, LDLCALC, TRIG, CHOLHDL, LDLDIRECT in the last 72 hours. Thyroid Function Tests: No results for input(s): TSH, T4TOTAL, FREET4, T3FREE, THYROIDAB in the last 72 hours. Anemia Panel: No results for input(s): VITAMINB12, FOLATE, FERRITIN, TIBC, IRON, RETICCTPCT in the last 72 hours. Urine analysis:    Component Value Date/Time   COLORURINE YELLOW 06/22/2016 0100   APPEARANCEUR CLEAR 06/22/2016 0100   LABSPEC 1.020 06/22/2016 0100   PHURINE 6.0 06/22/2016 0100   GLUCOSEU NEGATIVE 06/22/2016 0100   HGBUR NEGATIVE 06/22/2016 0100   BILIRUBINUR NEGATIVE 06/22/2016 0100   KETONESUR NEGATIVE 06/22/2016 0100   PROTEINUR NEGATIVE 06/22/2016 0100   UROBILINOGEN 0.2 12/28/2010 0050   NITRITE NEGATIVE  06/22/2016 0100   LEUKOCYTESUR NEGATIVE 06/22/2016 0100   Sepsis Labs: @LABRCNTIP (procalcitonin:4,lacticidven:4)  ) Recent Results (from the past 240 hour(s))  Surgical PCR screen     Status: None   Collection Time: 06/20/16  5:31 AM  Result Value Ref Range Status   MRSA, PCR NEGATIVE NEGATIVE Final   Staphylococcus aureus NEGATIVE NEGATIVE Final    Comment:  The Xpert SA Assay (FDA approved for NASAL specimens in patients over 9 years of age), is one component of a comprehensive surveillance program.  Test performance has been validated by [facility] for patients greater than or equal to 30 year old. It is not intended to diagnose infection nor to guide or monitor treatment.       Radiology Studies: No results found.   Scheduled Meds: . aspirin EC  81 mg Oral Daily  . bacitracin  1 application Topical TID  . cefUROXime (ZINACEF)  IV  1.5 g Intravenous To OR  . chlorhexidine      . chlorhexidine  15 mL Mouth/Throat BID  . Chlorhexidine Gluconate Cloth  6 each Topical Once  . dexmedetomidine  0.1-0.7 mcg/kg/hr Intravenous To OR  . diltiazem  240 mg Oral Daily  . docusate sodium  100 mg Oral BID  . DOPamine  0-10 mcg/kg/min Intravenous To OR  . epinephrine  0-10 mcg/min Intravenous To OR  . feeding supplement (ENSURE ENLIVE)  237 mL Oral TID BM  . heparin 30,000 units/NS 1000 mL solution for CELLSAVER   Other To OR  . hydrocortisone cream   Topical TID  . insulin (NOVOLIN-R) infusion   Intravenous To OR  . magnesium sulfate  40 mEq Other To OR  . metoprolol tartrate  50 mg Oral BID  . nitroGLYCERIN  2-200 mcg/min Intravenous To OR  . norepinephrine (LEVOPHED) Adult infusion  0-10 mcg/min Intravenous To OR  . phenylephrine 20mg /29mL NS (0.08mg /ml) infusion  30-200 mcg/min Intravenous To OR  . potassium chloride  80 mEq Other To OR  . rosuvastatin  10 mg Oral Daily  . sodium chloride flush  3 mL Intravenous Q12H  . sodium chloride flush  3 mL Intravenous  Q12H  . vancomycin  1,250 mg Intravenous To OR   Continuous Infusions: . heparin 1,500 Units/hr (06/21/16 0651)  . lactated ringers 50 mL/hr at 06/20/16 0853  . lactated ringers 10 mL/hr at 06/20/16 1412     LOS: 15 days   Time Spent in minutes   30 minutes  Merrell Rettinger D.O. on 06/22/2016 at 11:32 AM  Between 7am to 7pm - Pager - [phone]  After 7pm go to [email or web address] - password [email or web address]  And look for the night coverage person covering for me after hours  [facility] Office  [phone]

## 2016-06-22 NOTE — Transfer of Care (Signed)
Immediate Anesthesia Transfer of Care Note  Patient: Richard Simon  Procedure(s) Performed: Procedure(s): TRANSCATHETER AORTIC VALVE REPLACEMENT, TRANSFEMORAL (N/A) TRANSESOPHAGEAL ECHOCARDIOGRAM (TEE) (N/A)  Patient Location: ICU  Anesthesia Type:General  Level of Consciousness: awake, oriented and patient cooperative  Airway & Oxygen Therapy: Patient Spontanous Breathing and Patient connected to nasal cannula oxygen  Post-op Assessment: Report given to RN, Post -op Vital signs reviewed and stable and Patient moving all extremities  Post vital signs: Reviewed and stable  Last Vitals:  Vitals:   06/22/16 1512 06/22/16 1517  BP:    Pulse: 75 (!) 127  Resp:    Temp:      Last Pain:  Vitals:   06/22/16 0736  TempSrc:   PainSc: 0-No pain      Patients Stated Pain Goal: 0 (02/14/12 1438)  Complications: No apparent anesthesia complications

## 2016-06-22 NOTE — Progress Notes (Signed)
Patient ID: Richard Simon, male   DOB: 24-Dec-1929, 83 y.o.   MRN: [id number]  Cardiothoracic Surgery  He is hemodynamically stable this evening. MAP 78.  Atrial fib 100-120.  Awake and following commands.  Good strength in all extremities. Groin sites ok Valve sounds good.  Will resume Lopressor this evening for rate control and Cardizem in the morning.

## 2016-06-23 ENCOUNTER — Inpatient Hospital Stay (HOSPITAL_COMMUNITY): Payer: Medicare HMO

## 2016-06-23 ENCOUNTER — Other Ambulatory Visit: Payer: Self-pay

## 2016-06-23 ENCOUNTER — Encounter (HOSPITAL_COMMUNITY): Payer: Self-pay | Admitting: Cardiovascular Disease

## 2016-06-23 DIAGNOSIS — Z952 Presence of prosthetic heart valve: Secondary | ICD-10-CM

## 2016-06-23 DIAGNOSIS — I350 Nonrheumatic aortic (valve) stenosis: Secondary | ICD-10-CM

## 2016-06-23 LAB — ECHOCARDIOGRAM COMPLETE
AOVTI: 33.8 cm
AV Area mean vel: 1.75 cm2
AV VEL mean LVOT/AV: 0.62
AV area mean vel ind: 0.9 cm2/m2
AV pk vel: 201 cm/s
AV vel: 1.71
AVA: 1.71 cm2
AVAREAVTI: 1.82 cm2
AVAREAVTIIND: 0.88 cm2/m2
AVG: 9 mmHg
AVPG: 16 mmHg
Ao pk vel: 0.64 m/s
CHL CUP AV PEAK INDEX: 0.94
CHL CUP DOP CALC LVOT VTI: 20.4 cm
CHL CUP MV DEC (S): 176
DOP CAL AO MEAN VELOCITY: 139 cm/s
EERAT: 8.75
EWDT: 176 ms
FS: 20 % — AB (ref 28–44)
Height: 69 in
IVS/LV PW RATIO, ED: 0.73
LA diam index: 2.01 cm/m2
LASIZE: 39 mm
LAVOL: 113 mL
LAVOLA4C: 100 mL
LAVOLIN: 58.2 mL/m2
LEFT ATRIUM END SYS DIAM: 39 mm
LV E/e'average: 8.75
LV PW d: 11.2 mm — AB (ref 0.6–1.1)
LV TDI E'LATERAL: 12.8
LVEEMED: 8.75
LVELAT: 12.8 cm/s
LVOT area: 2.84 cm2
LVOT peak VTI: 0.6 cm
LVOT peak grad rest: 7 mmHg
LVOT peak vel: 129 cm/s
LVOTD: 19 mm
LVOTSV: 58 mL
MV Peak grad: 5 mmHg
MV pk E vel: 112 m/s
RV LATERAL S' VELOCITY: 8.78 cm/s
RV sys press: 36 mmHg
Reg peak vel: 264 cm/s
TDI e' medial: 3.43
TRMAXVEL: 264 cm/s
Valve area index: 0.88
Weight: 2768.98 oz

## 2016-06-23 LAB — CBC WITH DIFFERENTIAL/PLATELET
Basophils Absolute: 0 10*3/uL (ref 0.0–0.1)
Basophils Relative: 0 %
Eosinophils Absolute: 0.3 10*3/uL (ref 0.0–0.7)
Eosinophils Relative: 2 %
HEMATOCRIT: 30.2 % — AB (ref 39.0–52.0)
HEMOGLOBIN: 10 g/dL — AB (ref 13.0–17.0)
LYMPHS ABS: 0.9 10*3/uL (ref 0.7–4.0)
LYMPHS PCT: 9 %
MCH: 31.3 pg (ref 26.0–34.0)
MCHC: 33.1 g/dL (ref 30.0–36.0)
MCV: 94.4 fL (ref 78.0–100.0)
Monocytes Absolute: 0.9 10*3/uL (ref 0.1–1.0)
Monocytes Relative: 9 %
NEUTROS ABS: 8.2 10*3/uL — AB (ref 1.7–7.7)
Neutrophils Relative %: 80 %
Platelets: 217 10*3/uL (ref 150–400)
RBC: 3.2 MIL/uL — ABNORMAL LOW (ref 4.22–5.81)
RDW: 12.7 % (ref 11.5–15.5)
WBC: 10.2 10*3/uL (ref 4.0–10.5)

## 2016-06-23 LAB — BASIC METABOLIC PANEL
Anion gap: 5 (ref 5–15)
BUN: 6 mg/dL (ref 6–20)
CHLORIDE: 103 mmol/L (ref 101–111)
CO2: 26 mmol/L (ref 22–32)
CREATININE: 0.69 mg/dL (ref 0.61–1.24)
Calcium: 8.4 mg/dL — ABNORMAL LOW (ref 8.9–10.3)
GFR calc Af Amer: >60 mL/min (ref >60)
GFR calc non Af Amer: >60 mL/min (ref >60)
GLUCOSE: 101 mg/dL — AB (ref 65–99)
Potassium: 3.8 mmol/L (ref 3.5–5.1)
Sodium: 134 mmol/L — ABNORMAL LOW (ref 135–145)

## 2016-06-23 LAB — HEMOGLOBIN A1C
Hgb A1c MFr Bld: 6.1 % — ABNORMAL HIGH (ref 4.8–5.6)
Mean Plasma Glucose: 128 mg/dL

## 2016-06-23 LAB — MAGNESIUM: Magnesium: 1.6 mg/dL — ABNORMAL LOW (ref 1.7–2.4)

## 2016-06-23 MED ORDER — FUROSEMIDE 10 MG/ML IJ SOLN
40.0000 mg | Freq: Once | INTRAMUSCULAR | Status: AC
Start: 2016-06-23 — End: 2016-06-23
  Administered 2016-06-23: 40 mg via INTRAVENOUS
  Filled 2016-06-23: qty 4

## 2016-06-23 MED ORDER — CHLORHEXIDINE GLUCONATE 0.12 % MT SOLN
15.0000 mL | Freq: Two times a day (BID) | OROMUCOSAL | Status: DC
  Administered 2016-06-23 – 2016-06-26 (×6): 15 mL via OROMUCOSAL
  Filled 2016-06-23 (×5): qty 15

## 2016-06-23 MED ORDER — POLYETHYLENE GLYCOL 3350 17 G PO PACK: 17.0000 g | PACK | Freq: Every day | ORAL | Status: DC | PRN

## 2016-06-23 MED ORDER — ORAL CARE MOUTH RINSE
15.0000 mL | Freq: Two times a day (BID) | OROMUCOSAL | Status: DC
  Administered 2016-06-23 – 2016-06-26 (×5): 15 mL via OROMUCOSAL

## 2016-06-23 MED ORDER — DABIGATRAN ETEXILATE MESYLATE 150 MG PO CAPS
150.0000 mg | ORAL_CAPSULE | Freq: Two times a day (BID) | ORAL | Status: DC
  Filled 2016-06-23: qty 1

## 2016-06-23 MED ORDER — DABIGATRAN ETEXILATE MESYLATE 150 MG PO CAPS
150.0000 mg | ORAL_CAPSULE | Freq: Two times a day (BID) | ORAL | Status: DC
  Administered 2016-06-23 – 2016-06-27 (×8): 150 mg via ORAL
  Filled 2016-06-23 (×9): qty 1

## 2016-06-23 MED ORDER — METOPROLOL TARTRATE 25 MG PO TABS
75.0000 mg | ORAL_TABLET | Freq: Two times a day (BID) | ORAL | Status: DC
  Administered 2016-06-23: 22:00:00 75 mg via ORAL
  Administered 2016-06-23: 14:00:00 25 mg via ORAL
  Administered 2016-06-24 – 2016-06-27 (×7): 75 mg via ORAL
  Filled 2016-06-23 (×9): qty 1

## 2016-06-23 MED ORDER — DABIGATRAN ETEXILATE MESYLATE 150 MG PO CAPS: 150.0000 mg | ORAL_CAPSULE | Freq: Two times a day (BID) | ORAL | Status: DC

## 2016-06-23 MED FILL — Insulin Regular (Human) Inj 100 Unit/ML: INTRAMUSCULAR | Qty: 250 | Status: AC

## 2016-06-23 NOTE — Progress Notes (Signed)
Progress Note  Patient Name: Richard Simon Date of Encounter: 06/23/2016  Primary Cardiologist: [place]   Patient feels tired this morning. He denies chest pain or shortness of breath. No specific complaints.  Inpatient Medications    Scheduled Meds: . aspirin EC  81 mg Oral Daily  . bacitracin  1 application Topical TID  . cefUROXime (ZINACEF)  IV  1.5 g Intravenous Q12H  . Chlorhexidine Gluconate Cloth  6 each Topical Daily  . diltiazem  240 mg Oral Daily  . docusate sodium  100 mg Oral BID  . famotidine (PEPCID) IV  20 mg Intravenous Q12H  . feeding supplement (ENSURE ENLIVE)  237 mL Oral TID BM  . Gerhardt's butt cream   Topical BID  . hydrocortisone cream   Topical TID  . mouth rinse  15 mL Mouth Rinse BID  . metoprolol tartrate  50 mg Oral BID  . [START ON 06/24/2016] pantoprazole  40 mg Oral Daily  . rosuvastatin  10 mg Oral Daily  . sodium chloride flush  10-40 mL Intracatheter Q12H  . sodium chloride flush  3 mL Intravenous Q12H  . sodium chloride flush  3 mL Intravenous Q12H  . sodium chloride flush  3 mL Intravenous Q12H   Continuous Infusions: . lactated ringers Stopped (06/22/16 1900)  . lactated ringers 10 mL/hr at 06/23/16 0700  . nitroGLYCERIN Stopped (06/22/16 1745)  . phenylephrine (NEO-SYNEPHRINE) Adult infusion     PRN Meds: sodium chloride, sodium chloride, acetaminophen, albumin human, ALPRAZolam, guaiFENesin-dextromethorphan, HYDROcodone-acetaminophen, lactated ringers, levalbuterol, magnesium hydroxide, morphine injection, morphine injection, ondansetron **OR** ondansetron (ZOFRAN) IV, oxyCODONE, sodium chloride flush, sodium chloride flush, sodium chloride flush, traMADol, witch hazel-glycerin   Vital Signs    Vitals:   06/23/16 0600 06/23/16 0700 06/23/16 0749 06/23/16 0800  BP: 126/66 135/78    Pulse: 84 (!) 109  75  Resp: 17 16  20   Temp: 98.4 F (36.9 C) 99.3 F (37.4 C) 99.5 F (37.5 C) 99.5 F (37.5 C)  TempSrc:   Oral  Core (Comment)  SpO2: 100% 100%  100%  Weight: 173 lb 1 oz (78.5 kg)     Height: 5\' 9"  (1.753 m)       Intake/Output Summary (Last 24 hours) at 06/23/16 0802 Last data filed at 06/23/16 0800  Gross per 24 hour  Intake          2402.95 ml  Output             1630 ml  Net           772.95 ml   Filed Weights   06/21/16 0500 06/22/16 0559 06/23/16 0600  Weight: 166 lb 4.8 oz (75.4 kg) 168 lb 12.8 oz (76.6 kg) 173 lb 1 oz (78.5 kg)    Telemetry    Atrial fibrillation heart rate 100 120 bpm - Personally Reviewed  ECG    Atrial fibrillation with RVR - Personally Reviewed  Physical Exam  Alert and oriented, elderly male in no distress GEN: No acute distress.   Neck: No JVD Cardiac:  irregularly irregular, no murmurs, rubs, or gallops.  Respiratory: Clear to auscultation bilaterally. GI: Soft, nontender, non-distended  MS: No edema; No deformity. Bilateral groin sites are clear Neuro:  Nonfocal  Psych: Normal affect   Labs    Chemistry Recent Labs Lab 06/21/16 0551 06/22/16 0533 06/22/16 1359 06/22/16 1500 06/22/16 1617 06/23/16 0415  NA 134* 134* 135 135 136 134*  K 4.2 4.1 3.6 3.6 3.7 3.8  CL 99* 97* 97* 97*  --  103  CO2 26 28  --   --   --  26  GLUCOSE 98 106* 129* 130* 124* 101*  BUN 10 9 7 6   --  6  CREATININE 0.90 0.81 0.50* 0.60*  --  0.69  CALCIUM 9.2 9.1  --   --   --  8.4*  GFRNONAA >60 >60  --   --   --  >60  GFRAA >60 >60  --   --   --  >60  ANIONGAP 9 9  --   --   --  5     Hematology Recent Labs Lab 06/22/16 0533  06/22/16 1617 06/22/16 1630 06/23/16 0415  WBC 11.8*  --   --  18.9* 10.2  RBC 3.70*  --   --  3.45* 3.20*  HGB 11.7*  < > 9.9* 10.7* 10.0*  HCT 35.0*  < > 29.0* 32.4* 30.2*  MCV 94.6  --   --  93.9 94.4  MCH 31.6  --   --  31.0 31.3  MCHC 33.4  --   --  33.0 33.1  RDW 12.8  --   --  12.6 12.7  PLT 328  --   --  255 217  < > = values in this interval not displayed.  Cardiac EnzymesNo results for input(s): TROPONINI in the  last 168 hours. No results for input(s): TROPIPOC in the last 168 hours.   BNPNo results for input(s): BNP, PROBNP in the last 168 hours.   DDimer No results for input(s): DDIMER in the last 168 hours.   Radiology    Dg Chest Port 1 View  Result Date: 06/22/2016 CLINICAL DATA:  Severe aortic stenosis. EXAM: PORTABLE CHEST 1 VIEW COMPARISON:  06/19/2016 FINDINGS: Sequelae of prior CABG and interval transcatheter aortic valve replacement are identified. A right jugular catheter terminates over the SVC. The cardiomediastinal silhouette is unchanged. Diffuse interstitial coarsening with bilateral upper lobe predominant airspace opacities do not appear significantly changed. No sizable pleural effusion or pneumothorax is identified. IMPRESSION: 1. Interval TAVR.  Right jugular catheter overlies SVC. 2. Unchanged bilateral upper lobe predominant airspace disease. Electronically Signed   By: Logan Bores M.D.   On: 06/22/2016 16:29    Cardiac Studies   Postoperative day #1 echo is pending  Patient Profile     83 y.o. male with chronic atrial fibrillation, acute combined systolic and diastolic heart failure, and critical aortic stenosis  Assessment & Plan    1. Critical aortic stenosis, stage D: Patient postoperative day #1 from TAVR. He has done very well and is hemodynamically stable. Will DC his Foley catheter and arterial line and mobilize him today. Transfer to a cardiac stepdown bed.  2. Atrial fibrillation with RVR: Will increase metoprolol to 75 mg twice daily. Continue Cardizem CD. Resume Pradaxa this evening.  3. Acute on chronic combined systolic and diastolic heart failure: Should improve with treatment of his aortic stenosis. Continue current regimen and watch volume status closely. Chest x-ray reviewed and shows bilateral pleural effusions and some interstitial edema. Will give IV furosemide 40 mg 1 today.  Deatra James, MD  06/23/2016, 8:02 AM

## 2016-06-23 NOTE — Progress Notes (Signed)
TCTS BRIEF SICU PROGRESS NOTE  1 Day Post-Op  S/P Procedure(s) (LRB): TRANSCATHETER AORTIC VALVE REPLACEMENT, TRANSFEMORAL (N/A) TRANSESOPHAGEAL ECHOCARDIOGRAM (TEE) (N/A)   Stable day Afib w/ controlled rate, BP stable Breathing comfortably on room air UOP adequate  Plan: Continue current plan  Rexene Alberts, MD 06/23/2016 5:03 PM

## 2016-06-23 NOTE — Progress Notes (Signed)
CSW met patients family to discus patients discharge plan. Family stated they wanted a list of SNF/Longterm facility for both patient and patients spouse. Family stated that they will contact CSW with final facility choice. CSW remains available for support and discharge needs.  Rhea Pink, MSW,  [place]

## 2016-06-23 NOTE — Care Management (Signed)
Case Management Note Previous note completed by Michail Jewels 06/22/16 Initial Note Started BY Fuller Mandril, RN,BSN  Patient Details  Name: Richard Simon MRN: [id number] Date of Birth: 1930/01/08  Subjective/Objective:                  From home with spouse. /83 y.o.malewho has a PMHx of Afib, CAD, CABG x 4, HTN and DLD presents to the Emergency Department by EMS complaining of intermittent, moderate SOB for the past week.   Action/Plan: Admit status INPATIENT (Hyponatremia); anticipate discharge [place].   Expected Discharge Date:   (unsure)                     Expected Discharge Plan:  [place]  In-House Referral:  NA  Discharge planning Services  CM Consult  Post Acute Care Choice:  N/A  Choice offered to:   N/A  DME Arranged:   N/A DME Agency:   N/A  HH Arranged: N/A   HH Agency:   N/A  Status of Service:  COMPLETED  If discussed at [place] of Stay Meetings, dates discussed:  06-13-16, 06-20-16, 06-22-16  Additional Comments: 06/23/2016 Pt is in agreement with discharge to SNF  06-22-16 [address] [place] [phone] TAVR planned for 06-22-16. Plan will be for SNF once stable for d/c. CSW is following. CM will continue to monitor.   06-20-16 [address] [place] [phone] Diuresing with IV lasix. Aortic Stenosis- Dental consulted and pt had multiple extractions 06-20-16. Plan for TAVR- post will be SNF once stable. CSW monitoring.    1031 06-13-16 [name], RN,BSN [phone] PT recommendations for SNF. CSW is assisting with disposition needs. CM will continue to follow the patient as well.    1617 06-12-16 [name], RN,BSN [phone] CM did speak with son [name],[name] @ [phone] (cell phone). Son to visit today around 6pm. Son [name] stated the house phone has been out over the weekend and he is providing care to the patient's wife. CM will f/u for home needs.      1036 06-09-16 [name], RN,BSN [phone] CM did speak with pt in regards to disposition needs. Pt is from home with wife and son. CM was able to speak with son and husband has been the caregiver to wife with Parkinson's Disease. Son was a little overwhelmed with the care that is being needed for his mother. Pt sleepy at time of visit-Na levels low. Unable to make a decision on home Health Agency at this time. [place] Agency List left at the bedside. CM will continue to follow discharge plan of care with patient.

## 2016-06-23 NOTE — Progress Notes (Signed)
PROGRESS NOTE    Richard Simon  HCW:[id number] DOB: 1929/06/15 DOA: 06/07/2016 PCP: Merrilee Seashore, MD   Chief Complaint  Patient presents with  . Chest Pain  . Shortness of Breath    Brief Narrative:  83 y.o.malewith medical history significant of atrial fibrillation on Pradaxa, CAD, CABG, hyperlipidemia, hypertension, moderate to severe aortic stenosis is coming to the emergency department with complaints of on/off chest pressure, radiates to his back, improved by rest for the past week associated with dyspnea, orthopnea and fatigue.  He was found to be hyponatremic in the ED.  He was felt to be dehydrated, and received gentle IV hydration, which resulted in improvement in his sodium however he developed fluid overload quite rapidly.  2D echo later showed critical aortic stenosis and cardiology was consulted, patient was started on IV Lasix, his sodium has improved. CTS has determined that pt needs TAVR and to be done prior to discharge. Pt needs to have dental extraction, Dr. Orene Desanctis. Consulted and this is to be done today. Suspect that pt can undergo TAVR ~ 2 days post dental extraction.  Assessment & Plan   Chest pain -Echocardiogram done on 2/22 showed an ejection fraction of 45-50%, with mildly reduced systolic function, with diffuse hypokinesis and critical aortic valve stenosis.  Valve area was calculated at 0.26 cm.  Prior to the echo done in April 2017 showed normal EF of 17-61%, normal systolic function, with a valve area of 0.55 -per cardiology may need TAVR -no chest pain this AM   -appreciate cardiothoracic surgery assistance, s/p TAVR and dental extraction completed  -S/p dental extractions on 3/6  Stage D severe and symptomatic AS with Acute on chronic combined diastolic and systolic CHF, NYHA class III -echo shows a severely calcified trileaflet aortic valve with severely restricted leaflet mobility -LV systolic function has decreased compared to last year -cath shows  patent grafts and no significant ischemic territory -per vascular surgery, TAVR to be considered but vascular surgery recommends that pt is clear from pulmonary stand point  -pt taken to OR for dental extraction and full mouth debridement of remaining dentition -PCCM cleared for surgery with close monitoring in SDU vs ICU post op- recommended Post op care with BDs, adequate analgesia, avoid oversedation, incentive spirometry and pulm hygiene -s/p TAVR -Echocardiogram  CAP -Completed 7 days therapy with ABX but opacities still seen on imaging studies  -will ask PCCM to review and provide pre op clearance for pt  -no fevers and WBC is now WNL  Hyponatremia -Patient was initially hypovolemic and his sodium improved with gentle hydration, however he developed fluid overload rather rapidly due to his critical aortic stenosis, with worsening hyponatremia.   -He required IV Lasix, with improvement in his respiratory status as well as improvement in his sodium levels. -Sodium 134 today (was 119 upon admission) -Appears to have chronic hyponatremia (125-128 in 2010)  Plaque-like irregularity along the posterior aspect of the urinary bladder wall -adjacent to a small filling defect which may have an attachment to the wall of the urinary bladder -findings are concerning for potential urothelial neoplasm, and follow-up nonemergent evaluation by Urology is suggested in the near future to better evaluate these findings -when pt ready for discharge will set up follow up appointment   Acute encephalopathy -Patient with intermittent confusion, this is likely in the setting of infectious process, as well as probable delirium related to inpatient stay -appears to be stable at baseline   CAD (coronary artery disease) -Continue home medications, cardiology  following   Essential hypertension -reasonably stable for now   Dyslipidemia, goal LDL below 70 -Continue Crestor  Atrial fibrillation,  permanent (HCC) -CHA2DS2-VASc Score of at least 4. -Continue atenolol for rate control, on Heparin drip for now  -per cardiology, added digoxin on 3/4 -HR now in low 100's   Severe malnutrition -In the context of chronic illness -nutrition consulted and recommended supplements  DVT Prophylaxis  SCDs, Heparin   Code Status: Full  Family Communication: None at bedside  Disposition Plan: Admitted.  Patient currently in ICU and will transfer to cardiac step down, under cardiology service.  Will sign off.  Please let us know if we can be of further assistance.  Consultants Cardiology Cardiothoracic surgery PCCM  Dentistry, Dr. Enrique Sack  Procedures  Echocardiogram Multiple tooth extractions (8,75,64,33) with alveoloplasty, full mouth debridement of remaining dentition  TAVR  Antibiotics   Anti-infectives    Start     Dose/Rate Route Frequency Ordered Stop   06/23/16 0000  vancomycin (VANCOCIN) IVPB 1000 mg/200 mL premix     1,000 mg 200 mL/hr over 60 Minutes Intravenous  Once 06/22/16 1555 06/23/16 0100   06/22/16 1930  cefUROXime (ZINACEF) 1.5 g in dextrose 5 % 50 mL IVPB     1.5 g 100 mL/hr over 30 Minutes Intravenous Every 12 hours 06/22/16 1555 06/24/16 1929   06/22/16 0400  vancomycin (VANCOCIN) 1,250 mg in sodium chloride 0.9 % 250 mL IVPB     1,250 mg 166.7 mL/hr over 90 Minutes Intravenous To Surgery 06/21/16 1332 06/22/16 1400   06/22/16 0400  cefUROXime (ZINACEF) 1.5 g in dextrose 5 % 50 mL IVPB     1.5 g 100 mL/hr over 30 Minutes Intravenous To Surgery 06/21/16 1332 06/22/16 1400   06/20/16 0600  ceFAZolin (ANCEF) IVPB 2g/100 mL premix     2 g 200 mL/hr over 30 Minutes Intravenous  Once 06/19/16 1604 06/20/16 0635   06/20/16 0400  vancomycin (VANCOCIN) 1,250 mg in sodium chloride 0.9 % 250 mL IVPB  Status:  Discontinued     1,250 mg 166.7 mL/hr over 90 Minutes Intravenous To Surgery 06/19/16 1047 06/19/16 1522   06/20/16 0400  cefUROXime (ZINACEF) 1.5 g in  dextrose 5 % 50 mL IVPB  Status:  Discontinued     1.5 g 100 mL/hr over 30 Minutes Intravenous To Surgery 06/19/16 1047 06/19/16 1522   06/08/16 0500  azithromycin (ZITHROMAX) 500 mg in dextrose 5 % 250 mL IVPB     500 mg 250 mL/hr over 60 Minutes Intravenous Every 24 hours 06/07/16 0240 06/14/16 0611   06/08/16 0300  cefTRIAXone (ROCEPHIN) 1 g in dextrose 5 % 50 mL IVPB     1 g 100 mL/hr over 30 Minutes Intravenous Every 24 hours 06/07/16 0240 06/14/16 0408   06/07/16 0215  cefTRIAXone (ROCEPHIN) 1 g in dextrose 5 % 50 mL IVPB     1 g 100 mL/hr over 30 Minutes Intravenous  Once 06/07/16 0212 06/07/16 0330   06/07/16 0215  azithromycin (ZITHROMAX) 500 mg in dextrose 5 % 250 mL IVPB     500 mg 250 mL/hr over 60 Minutes Intravenous  Once 06/07/16 2951 06/07/16 0601      Subjective:   Freddi Starr seen and examined today. Patient complains of pain from dental surgery, otherwise, has no new complaints. Denies chest pain, shortness of breath, abdominal pain, N/V/D/C.   Objective:   Vitals:   06/23/16 1000 06/23/16 1010 06/23/16 1100 06/23/16 1225  BP:  100/73 118/71  Pulse: (!) 116 (!) 111 68   Resp: 17 19 (!) 29   Temp:    98.6 F (37 C)  TempSrc:    Oral  SpO2: 93% 93% 92%   Weight:      Height:        Intake/Output Summary (Last 24 hours) at 06/23/16 1317 Last data filed at 06/23/16 1200  Gross per 24 hour  Intake          2562.95 ml  Output             1750 ml  Net           812.95 ml   Filed Weights   06/21/16 0500 06/22/16 0559 06/23/16 0600  Weight: 75.4 kg (166 lb 4.8 oz) 76.6 kg (168 lb 12.8 oz) 78.5 kg (173 lb 1 oz)    Exam  General: Well developed, well nourished, NAD, appears stated age  HEENT: NCAT, mucous membranes moist  Cardiovascular: S1 S2 auscultated, 3/6 SEM, tachycardic.  Respiratory: Clear to auscultation bilaterally with equal chest rise  Abdomen: Soft, nontender, nondistended, + bowel sounds  Extremities: warm dry without cyanosis  clubbing or edema  Neuro: AAOx3, nonfocal  Psych: Normal affect and demeanor, pleasant    Data Reviewed: I have personally reviewed following labs and imaging studies  CBC:  Recent Labs Lab 06/19/16 0409 06/20/16 0258 06/21/16 0551 06/22/16 0533 06/22/16 1359 06/22/16 1500 06/22/16 1617 06/22/16 1630 06/23/16 0415  WBC 8.4 8.2 9.3 11.8*  --   --   --  18.9* 10.2  NEUTROABS 6.0 5.4 7.3 9.2*  --   --   --   --  8.2*  HGB 11.0* 11.1* 11.5* 11.7* 9.9* 9.9* 9.9* 10.7* 10.0*  HCT 33.3* 33.1* 34.5* 35.0* 29.0* 29.0* 29.0* 32.4* 30.2*  MCV 94.6 94.3 95.0 94.6  --   --   --  93.9 94.4  PLT 357 339 250 328  --   --   --  255 808   Basic Metabolic Panel:  Recent Labs Lab 06/18/16 0342 06/19/16 0409 06/21/16 0551 06/22/16 0533 06/22/16 1359 06/22/16 1500 06/22/16 1617 06/23/16 0415  NA 132* 135 134* 134* 135 135 136 134*  K 3.8 4.0 4.2 4.1 3.6 3.6 3.7 3.8  CL 97* 101 99* 97* 97* 97*  --  103  CO2 24 29 26 28   --   --   --  26  GLUCOSE 108* 104* 98 106* 129* 130* 124* 101*  BUN 8 5* 10 9 7 6   --  6  CREATININE 0.70 0.71 0.90 0.81 0.50* 0.60*  --  0.69  CALCIUM 8.9 9.0 9.2 9.1  --   --   --  8.4*  MG  --   --   --   --   --   --   --  1.6*   GFR: Estimated Creatinine Clearance: 68.7 mL/min (by C-G formula based on SCr of 0.69 mg/dL). Liver Function Tests: No results for input(s): AST, ALT, ALKPHOS, BILITOT, PROT, ALBUMIN in the last 168 hours. No results for input(s): LIPASE, AMYLASE in the last 168 hours. No results for input(s): AMMONIA in the last 168 hours. Coagulation Profile:  Recent Labs Lab 06/22/16 0533 06/22/16 1630  INR 1.24 1.39   Cardiac Enzymes: No results for input(s): CKTOTAL, CKMB, CKMBINDEX, TROPONINI in the last 168 hours. BNP (last 3 results) No results for input(s): PROBNP in the last 8760 hours. HbA1C:  Recent Labs  06/22/16 0533  HGBA1C 6.1*  CBG:  Recent Labs Lab 06/20/16 1253  GLUCAP 100*   Lipid Profile: No results for  input(s): CHOL, HDL, LDLCALC, TRIG, CHOLHDL, LDLDIRECT in the last 72 hours. Thyroid Function Tests: No results for input(s): TSH, T4TOTAL, FREET4, T3FREE, THYROIDAB in the last 72 hours. Anemia Panel: No results for input(s): VITAMINB12, FOLATE, FERRITIN, TIBC, IRON, RETICCTPCT in the last 72 hours. Urine analysis:    Component Value Date/Time   COLORURINE YELLOW 06/22/2016 0100   APPEARANCEUR CLEAR 06/22/2016 0100   LABSPEC 1.020 06/22/2016 0100   PHURINE 6.0 06/22/2016 0100   GLUCOSEU NEGATIVE 06/22/2016 0100   HGBUR NEGATIVE 06/22/2016 0100   BILIRUBINUR NEGATIVE 06/22/2016 0100   KETONESUR NEGATIVE 06/22/2016 0100   PROTEINUR NEGATIVE 06/22/2016 0100   UROBILINOGEN 0.2 12/28/2010 0050   NITRITE NEGATIVE 06/22/2016 0100   LEUKOCYTESUR NEGATIVE 06/22/2016 0100   Sepsis Labs: @LABRCNTIP (procalcitonin:4,lacticidven:4)  ) Recent Results (from the past 240 hour(s))  Surgical PCR screen     Status: None   Collection Time: 06/20/16  5:31 AM  Result Value Ref Range Status   MRSA, PCR NEGATIVE NEGATIVE Final   Staphylococcus aureus NEGATIVE NEGATIVE Final    Comment:        The Xpert SA Assay (FDA approved for NASAL specimens in patients over 16 years of age), is one component of a comprehensive surveillance program.  Test performance has been validated by [facility] for patients greater than or equal to 46 year old. It is not intended to diagnose infection nor to guide or monitor treatment.       Radiology Studies: Dg Chest Port 1 View  Result Date: 06/23/2016 CLINICAL DATA:  Severe aortic stenosis status post TAVR EXAM: PORTABLE CHEST 1 VIEW COMPARISON:  Chest radiograph from one day prior. FINDINGS: Stable configuration of right internal jugular central venous catheter, median sternotomy wires and aortic valve prosthesis. Stable cardiomediastinal silhouette with mild cardiomegaly and aortic atherosclerosis. No pneumothorax. Small bilateral pleural effusions appear  slightly increased bilaterally. Mild-to-moderate pulmonary edema appears stable. IMPRESSION: 1. No pneumothorax . 2. Small bilateral pleural effusions, slightly increased bilaterally. 3. Mild-to-moderate congestive heart failure, stable . 4. Aortic atherosclerosis. Electronically Signed   By: Ilona Sorrel M.D.   On: 06/23/2016 09:08   Dg Chest Port 1 View  Result Date: 06/22/2016 CLINICAL DATA:  Severe aortic stenosis. EXAM: PORTABLE CHEST 1 VIEW COMPARISON:  06/19/2016 FINDINGS: Sequelae of prior CABG and interval transcatheter aortic valve replacement are identified. A right jugular catheter terminates over the SVC. The cardiomediastinal silhouette is unchanged. Diffuse interstitial coarsening with bilateral upper lobe predominant airspace opacities do not appear significantly changed. No sizable pleural effusion or pneumothorax is identified. IMPRESSION: 1. Interval TAVR.  Right jugular catheter overlies SVC. 2. Unchanged bilateral upper lobe predominant airspace disease. Electronically Signed   By: Logan Bores M.D.   On: 06/22/2016 16:29     Scheduled Meds: . aspirin EC  81 mg Oral Daily  . bacitracin  1 application Topical TID  . cefUROXime (ZINACEF)  IV  1.5 g Intravenous Q12H  . chlorhexidine  15 mL Mouth Rinse BID  . Chlorhexidine Gluconate Cloth  6 each Topical Daily  . dabigatran  150 mg Oral Q12H  . diltiazem  240 mg Oral Daily  . docusate sodium  100 mg Oral BID  . famotidine (PEPCID) IV  20 mg Intravenous Q12H  . feeding supplement (ENSURE ENLIVE)  237 mL Oral TID BM  . Gerhardt's butt cream   Topical BID  . hydrocortisone cream  Topical TID  . mouth rinse  15 mL Mouth Rinse BID  . mouth rinse  15 mL Mouth Rinse q12n4p  . metoprolol tartrate  75 mg Oral BID  . [START ON 06/24/2016] pantoprazole  40 mg Oral Daily  . rosuvastatin  10 mg Oral Daily  . sodium chloride flush  10-40 mL Intracatheter Q12H  . sodium chloride flush  3 mL Intravenous Q12H  . sodium chloride flush  3 mL  Intravenous Q12H  . sodium chloride flush  3 mL Intravenous Q12H   Continuous Infusions: . lactated ringers Stopped (06/22/16 1900)  . lactated ringers Stopped (06/23/16 1200)  . nitroGLYCERIN Stopped (06/22/16 1745)  . phenylephrine (NEO-SYNEPHRINE) Adult infusion       LOS: 16 days   Time Spent in minutes   30 minutes  Josephine Wooldridge D.O. on 06/23/2016 at 1:17 PM  Between 7am to 7pm - Pager - [phone]  After 7pm go to [email or web address] - password [email or web address]  And look for the night coverage person covering for me after hours  [facility] Office  [phone]

## 2016-06-23 NOTE — Progress Notes (Signed)
  Echocardiogram 2D Echocardiogram has been performed.  Tresa Res 06/23/2016, 12:17 PM

## 2016-06-24 DIAGNOSIS — I350 Nonrheumatic aortic (valve) stenosis: Secondary | ICD-10-CM

## 2016-06-24 DIAGNOSIS — Z954 Presence of other heart-valve replacement: Secondary | ICD-10-CM

## 2016-06-24 LAB — CBC WITH DIFFERENTIAL/PLATELET
BASOS ABS: 0 10*3/uL (ref 0.0–0.1)
BASOS PCT: 0 %
EOS ABS: 0.2 10*3/uL (ref 0.0–0.7)
Eosinophils Relative: 2 %
HEMATOCRIT: 31.1 % — AB (ref 39.0–52.0)
HEMOGLOBIN: 10.6 g/dL — AB (ref 13.0–17.0)
Lymphocytes Relative: 10 %
Lymphs Abs: 1.1 10*3/uL (ref 0.7–4.0)
MCH: 32 pg (ref 26.0–34.0)
MCHC: 34.1 g/dL (ref 30.0–36.0)
MCV: 94 fL (ref 78.0–100.0)
Monocytes Absolute: 1 10*3/uL (ref 0.1–1.0)
Monocytes Relative: 10 %
NEUTROS ABS: 8.4 10*3/uL — AB (ref 1.7–7.7)
NEUTROS PCT: 78 %
Platelets: 229 10*3/uL (ref 150–400)
RBC: 3.31 MIL/uL — ABNORMAL LOW (ref 4.22–5.81)
RDW: 12.7 % (ref 11.5–15.5)
WBC: 10.7 10*3/uL — ABNORMAL HIGH (ref 4.0–10.5)

## 2016-06-24 MED ORDER — SODIUM CHLORIDE 0.9% FLUSH: 3.0000 mL | INTRAVENOUS | Status: DC | PRN

## 2016-06-24 MED ORDER — CHLORHEXIDINE GLUCONATE CLOTH 2 % EX PADS
6.0000 | MEDICATED_PAD | Freq: Every day | CUTANEOUS | Status: DC
  Administered 2016-06-24: 6 via TOPICAL

## 2016-06-24 MED ORDER — SODIUM CHLORIDE 0.9% FLUSH: 10.0000 mL | Freq: Two times a day (BID) | INTRAVENOUS | Status: DC

## 2016-06-24 MED ORDER — SODIUM CHLORIDE 0.9% FLUSH
3.0000 mL | Freq: Two times a day (BID) | INTRAVENOUS | Status: DC
  Administered 2016-06-24 – 2016-06-27 (×4): 3 mL via INTRAVENOUS

## 2016-06-24 MED ORDER — FUROSEMIDE 40 MG PO TABS
40.0000 mg | ORAL_TABLET | Freq: Every day | ORAL | Status: DC
  Administered 2016-06-24 – 2016-06-27 (×4): 40 mg via ORAL
  Filled 2016-06-24 (×4): qty 1

## 2016-06-24 MED ORDER — SODIUM CHLORIDE 0.9% FLUSH: 10.0000 mL | INTRAVENOUS | Status: DC | PRN

## 2016-06-24 MED ORDER — POTASSIUM CHLORIDE CRYS ER 20 MEQ PO TBCR
20.0000 meq | EXTENDED_RELEASE_TABLET | Freq: Every day | ORAL | Status: DC
  Administered 2016-06-24 – 2016-06-27 (×4): 20 meq via ORAL
  Filled 2016-06-24 (×3): qty 1

## 2016-06-24 MED ORDER — SODIUM CHLORIDE 0.9 % IV SOLN: 250.0000 mL | INTRAVENOUS | Status: DC | PRN

## 2016-06-24 MED ORDER — MOVING RIGHT ALONG BOOK
Freq: Once | Status: AC
  Administered 2016-06-24: 10:00:00
  Filled 2016-06-24: qty 1

## 2016-06-24 NOTE — Progress Notes (Signed)
Seen by Dr. Roxy Manns for the TAVR team - he has ordered transfer to telemetry today. Agree with plans for diuresis and PT consult.   Pixie Casino, MD, [facility] Attending Cardiologist [place]

## 2016-06-24 NOTE — Progress Notes (Signed)
Patient now under cardiology/CTS service. TRH will s/o. Please consult if we can be of further assistance.

## 2016-06-24 NOTE — Progress Notes (Signed)
      [address]       [place] [address]             [phone]        CARDIOTHORACIC SURGERY PROGRESS NOTE   R2 Days Post-Op Procedure(s) (LRB): TRANSCATHETER AORTIC VALVE REPLACEMENT, TRANSFEMORAL (N/A) TRANSESOPHAGEAL ECHOCARDIOGRAM (TEE) (N/A)  Subjective: Looks good.  Denies pain or SOB.  Eating breakfast.  Very concerned about having a bowel movement, although denies abdominal discomfort and patient's nurse reports that he had one yesterday.    Objective: Vital signs: BP Readings from Last 1 Encounters:  06/24/16 135/75   Pulse Readings from Last 1 Encounters:  06/24/16 (!) 102   Resp Readings from Last 1 Encounters:  06/24/16 16   Temp Readings from Last 1 Encounters:  06/24/16 99.1 F (37.3 C) (Oral)    Hemodynamics:    Physical Exam:  Rhythm:   Afib w/ controlled rate  Breath sounds: clear  Heart sounds:  Irregular w/out murmur  Incisions:  Both groins okay  Abdomen:  Soft, non-distended, non-tender  Extremities:  Warm, well-perfused    Intake/Output from previous day: 03/09 0701 - 03/10 0700 In: 900 [P.O.:750; I.V.:50; IV Piggyback:100] Out: 1105 [Urine:1105] Intake/Output this shift: Total I/O In: 170 [P.O.:120; IV Piggyback:50] Out: 125 [Urine:125]  Lab Results:  CBC: Recent Labs  06/23/16 0415 06/24/16 0340  WBC 10.2 10.7*  HGB 10.0* 10.6*  HCT 30.2* 31.1*  PLT 217 229    BMET:  Recent Labs  06/22/16 0533  06/22/16 1500 06/22/16 1617 06/23/16 0415  NA 134*  < > 135 136 134*  K 4.1  < > 3.6 3.7 3.8  CL 97*  < > 97*  --  103  CO2 28  --   --   --  26  GLUCOSE 106*  < > 130* 124* 101*  BUN 9  < > 6  --  6  CREATININE 0.81  < > 0.60*  --  0.69  CALCIUM 9.1  --   --   --  8.4*  < > = values in this interval not displayed.   PT/INR:   Recent Labs  06/22/16 1630  LABPROT 17.2*  INR 1.39    CBG (last 3)  No results for input(s): GLUCAP in the last 72 hours.  ABG    Component Value Date/Time   PHART 7.422 06/22/2016 1652   PCO2ART 41.5 06/22/2016 1652   PO2ART 108.0 06/22/2016 1652   HCO3 26.9 06/22/2016 1652   TCO2 28 06/22/2016 1652   O2SAT 98.0 06/22/2016 1652    CXR: n/a  Assessment/Plan: S/P Procedure(s) (LRB): TRANSCATHETER AORTIC VALVE REPLACEMENT, TRANSFEMORAL (N/A) TRANSESOPHAGEAL ECHOCARDIOGRAM (TEE) (N/A)  Overall doing well POD2 TAVR Maintaining rate-controlled Afib w/ stable BP Breathing comfortably w/ O2 sats 91-97% on RA Expected post op acute blood loss anemia, mild, stable Acute on chronic combined systolic and diastolic CHF with expected post-op volume excess, weight still up slightly in c/w immediately preop but markedly less than at the time of original presentation Mild confusion   Mobilize  Gentle diuresis  Continue low dose ASA and Pradaxa  Continue metoprolol, diltiazem  PT consult  Transfer telemetry  I spent in excess of 15 minutes during the conduct of this hospital encounter and >50% of this time involved direct face-to-face encounter with the patient for counseling and/or coordination of their care.   Rexene Alberts, MD 06/24/2016 10:13 AM

## 2016-06-25 DIAGNOSIS — I442 Atrioventricular block, complete: Secondary | ICD-10-CM

## 2016-06-25 LAB — CBC WITH DIFFERENTIAL/PLATELET
BASOS ABS: 0 10*3/uL (ref 0.0–0.1)
BASOS PCT: 0 %
Eosinophils Absolute: 0.4 10*3/uL (ref 0.0–0.7)
Eosinophils Relative: 5 %
HEMATOCRIT: 29.3 % — AB (ref 39.0–52.0)
HEMOGLOBIN: 9.8 g/dL — AB (ref 13.0–17.0)
LYMPHS PCT: 13 %
Lymphs Abs: 1.1 10*3/uL (ref 0.7–4.0)
MCH: 31.5 pg (ref 26.0–34.0)
MCHC: 33.4 g/dL (ref 30.0–36.0)
MCV: 94.2 fL (ref 78.0–100.0)
Monocytes Absolute: 0.8 10*3/uL (ref 0.1–1.0)
Monocytes Relative: 10 %
NEUTROS ABS: 5.7 10*3/uL (ref 1.7–7.7)
NEUTROS PCT: 72 %
Platelets: 193 10*3/uL (ref 150–400)
RBC: 3.11 MIL/uL — ABNORMAL LOW (ref 4.22–5.81)
RDW: 12.9 % (ref 11.5–15.5)
WBC: 8.1 10*3/uL (ref 4.0–10.5)

## 2016-06-25 LAB — BASIC METABOLIC PANEL
ANION GAP: 6 (ref 5–15)
BUN: 7 mg/dL (ref 6–20)
CALCIUM: 8.6 mg/dL — AB (ref 8.9–10.3)
CO2: 28 mmol/L (ref 22–32)
Chloride: 99 mmol/L — ABNORMAL LOW (ref 101–111)
Creatinine, Ser: 0.66 mg/dL (ref 0.61–1.24)
GFR calc Af Amer: >60 mL/min (ref >60)
GLUCOSE: 116 mg/dL — AB (ref 65–99)
POTASSIUM: 3.5 mmol/L (ref 3.5–5.1)
SODIUM: 133 mmol/L — AB (ref 135–145)

## 2016-06-25 NOTE — Progress Notes (Addendum)
      [address]       [place] [address]             [phone]      3 Days Post-Op Procedure(s) (LRB): TRANSCATHETER AORTIC VALVE REPLACEMENT, TRANSFEMORAL (N/A) TRANSESOPHAGEAL ECHOCARDIOGRAM (TEE) (N/A)   Subjective:  No new complaints.  Awoke from sleep and states he needs to go back to sleep. Mild Confusion  Objective: Vital signs in last 24 hours: Temp:  [98.4 F (36.9 C)-99.1 F (37.3 C)] 98.8 F (37.1 C) (03/11 0531) Pulse Rate:  [87-102] 93 (03/11 0531) Cardiac Rhythm: Atrial fibrillation (03/11 0852) Resp:  [16-23] 18 (03/11 0531) BP: (109-135)/(51-77) 121/69 (03/11 0531) SpO2:  [93 %-97 %] 94 % (03/11 0531) Weight:  [172 lb 6.4 oz (78.2 kg)] 172 lb 6.4 oz (78.2 kg) (03/11 0531)  Intake/Output from previous day: 03/10 0701 - 03/11 0700 In: 1370 [P.O.:1320; IV Piggyback:50] Out: 125 [Urine:125] Intake/Output this shift: Total I/O In: -  Out: 100 [Urine:100]  General appearance: cooperative Heart: regular rate and rhythm Lungs: clear to auscultation bilaterally Extremities: extremities normal, atraumatic, no cyanosis or edema Wound: clean and dry  Lab Results:  Recent Labs  06/24/16 0340 06/25/16 0412  WBC 10.7* 8.1  HGB 10.6* 9.8*  HCT 31.1* 29.3*  PLT 229 193   BMET:  Recent Labs  06/23/16 0415 06/25/16 0412  NA 134* 133*  K 3.8 3.5  CL 103 99*  CO2 26 28  GLUCOSE 101* 116*  BUN 6 7  CREATININE 0.69 0.66  CALCIUM 8.4* 8.6*    PT/INR:  Recent Labs  06/22/16 1630  LABPROT 17.2*  INR 1.39   ABG    Component Value Date/Time   PHART 7.422 06/22/2016 1652   HCO3 26.9 06/22/2016 1652   TCO2 28 06/22/2016 1652   O2SAT 98.0 06/22/2016 1652   CBG (last 3)  No results for input(s): GLUCAP in the last 72 hours.  Assessment/Plan: S/P Procedure(s) (LRB): TRANSCATHETER AORTIC VALVE REPLACEMENT, TRANSFEMORAL (N/A) TRANSESOPHAGEAL ECHOCARDIOGRAM (TEE) (N/A)  1. CV- Previous A. Fib, NSR, with PVCs- on Lopressor,  Cardizem, continue ASA, Pradaxa 2. Pulm- no acute issues, off oxygen 3. Renal- creatinine WNL, continue Lasix 4. Dispo- patient stable, care per Cardiology   LOS: 18 days    BARRETT, ERIN 06/25/2016   I have seen and examined the patient and agree with the assessment and plan as outlined.  Will likely need placement in SNF for discharge - arrangements for the patient's wife to be coordinated as well.  I spent in excess of 15 minutes during the conduct of this hospital encounter and >50% of this time involved direct face-to-face encounter with the patient for counseling and/or coordination of their care.   Rexene Alberts, MD 06/25/2016 10:58 AM

## 2016-06-25 NOTE — Progress Notes (Signed)
DAILY PROGRESS NOTE  Subjective:  No complaints. Awaiting SNF placement. Seen by Dr. Roxy Manns this am - felt to be stable. LVEF 40-45% by echo on 3/9 with normally functioning Sapien 3 bioprosthetic stent valve. A-fib with periods of idioventricular rhythm for <5 seconds noted on telemetry, asymptomatic.  Objective:  Temp:  [98.4 F (36.9 C)-99 F (37.2 C)] 98.8 F (37.1 C) (03/11 0531) Pulse Rate:  [87-101] 93 (03/11 0531) Resp:  [18-21] 18 (03/11 0531) BP: (116-121)/(51-69) 121/69 (03/11 0531) SpO2:  [93 %-95 %] 94 % (03/11 0531) Weight:  [172 lb 6.4 oz (78.2 kg)] 172 lb 6.4 oz (78.2 kg) (03/11 0531) Weight change: 1 lb 1.6 oz (0.5 kg)  Intake/Output from previous day: 03/10 0701 - 03/11 0700 In: 1370 [P.O.:1320; IV Piggyback:50] Out: 125 [Urine:125]  Intake/Output from this shift: Total I/O In: -  Out: 100 [Urine:100]  Medications: No current facility-administered medications on file prior to encounter.    Current Outpatient Prescriptions on File Prior to Encounter  Medication Sig Dispense Refill  . Acetaminophen 500 MG coapsule Take 1 tablet by mouth every 6 (six) hours as needed for pain.     Marland Kitchen aspirin EC 81 MG tablet Take 81 mg by mouth daily.    Marland Kitchen atenolol (TENORMIN) 50 MG tablet Take 50 mg by mouth daily.    Marland Kitchen CARTIA XT 240 MG 24 hr capsule TAKE 1 CAPSULE EVERY DAY 90 capsule 2  . co-enzyme Q-10 30 MG capsule Take 1 capsule by mouth daily.    . dabigatran (PRADAXA) 150 MG CAPS capsule Take 1 capsule (150 mg total) by mouth 2 (two) times daily. 180 capsule 3  . lisinopril (PRINIVIL,ZESTRIL) 10 MG tablet Take 1 tablet by mouth at bedtime.    . Omega-3 Fatty Acids (FISH OIL) 1200 MG CAPS Take 1,200 mg by mouth 2 (two) times daily.    . rosuvastatin (CRESTOR) 10 MG tablet TAKE 1 TABLET (10 MG TOTAL) BY MOUTH DAILY. 90 tablet 0    Physical Exam: General appearance: sleepy, mildly confused Lungs: clear to auscultation bilaterally Heart: regular rate and  rhythm Extremities: extremities normal, atraumatic, no cyanosis or edema Neurologic: Grossly normal  Lab Results: Results for orders placed or performed during the hospital encounter of 06/07/16 (from the past 48 hour(s))  CBC WITH DIFFERENTIAL     Status: Abnormal   Collection Time: 06/24/16  3:40 AM  Result Value Ref Range   WBC 10.7 (H) 4.0 - 10.5 K/uL   RBC 3.31 (L) 4.22 - 5.81 MIL/uL   Hemoglobin 10.6 (L) 13.0 - 17.0 g/dL   HCT 31.1 (L) 39.0 - 52.0 %   MCV 94.0 78.0 - 100.0 fL   MCH 32.0 26.0 - 34.0 pg   MCHC 34.1 30.0 - 36.0 g/dL   RDW 12.7 11.5 - 15.5 %   Platelets 229 150 - 400 K/uL   Neutrophils Relative % 78 %   Neutro Abs 8.4 (H) 1.7 - 7.7 K/uL   Lymphocytes Relative 10 %   Lymphs Abs 1.1 0.7 - 4.0 K/uL   Monocytes Relative 10 %   Monocytes Absolute 1.0 0.1 - 1.0 K/uL   Eosinophils Relative 2 %   Eosinophils Absolute 0.2 0.0 - 0.7 K/uL   Basophils Relative 0 %   Basophils Absolute 0.0 0.0 - 0.1 K/uL  CBC WITH DIFFERENTIAL     Status: Abnormal   Collection Time: 06/25/16  4:12 AM  Result Value Ref Range   WBC 8.1 4.0 - 10.5 K/uL  RBC 3.11 (L) 4.22 - 5.81 MIL/uL   Hemoglobin 9.8 (L) 13.0 - 17.0 g/dL   HCT 29.3 (L) 39.0 - 52.0 %   MCV 94.2 78.0 - 100.0 fL   MCH 31.5 26.0 - 34.0 pg   MCHC 33.4 30.0 - 36.0 g/dL   RDW 12.9 11.5 - 15.5 %   Platelets 193 150 - 400 K/uL   Neutrophils Relative % 72 %   Neutro Abs 5.7 1.7 - 7.7 K/uL   Lymphocytes Relative 13 %   Lymphs Abs 1.1 0.7 - 4.0 K/uL   Monocytes Relative 10 %   Monocytes Absolute 0.8 0.1 - 1.0 K/uL   Eosinophils Relative 5 %   Eosinophils Absolute 0.4 0.0 - 0.7 K/uL   Basophils Relative 0 %   Basophils Absolute 0.0 0.0 - 0.1 K/uL  Basic metabolic panel     Status: Abnormal   Collection Time: 06/25/16  4:12 AM  Result Value Ref Range   Sodium 133 (L) 135 - 145 mmol/L   Potassium 3.5 3.5 - 5.1 mmol/L   Chloride 99 (L) 101 - 111 mmol/L   CO2 28 22 - 32 mmol/L   Glucose, Bld 116 (H) 65 - 99 mg/dL   BUN 7  6 - 20 mg/dL   Creatinine, Ser 0.66 0.61 - 1.24 mg/dL   Calcium 8.6 (L) 8.9 - 10.3 mg/dL   GFR calc non Af Amer >60 >60 mL/min   GFR calc Af Amer >60 >60 mL/min    Comment: (NOTE) The eGFR has been calculated using the CKD EPI equation. This calculation has not been validated in all clinical situations. eGFR's persistently <60 mL/min signify possible Chronic Kidney Disease.    Anion gap 6 5 - 15    Imaging: No results found.  Assessment:  1. Principal Problem: 2.   Hyponatremia 3. Active Problems: 4.   Severe aortic stenosis 5.   Essential hypertension 6.   Dyslipidemia, goal LDL below 70 7.   Atrial fibrillation, permanent ([place]) 8.   CAD (coronary artery disease) 9.   Chest pain 10.   SOB (shortness of breath) 11.   Acute encephalopathy 12.   Chronic diastolic heart failure ([place]) 13.   Dental caries 14.   Chronic periodontitis 15.   Plan:  1. Fatigued - awaiting SNF, not clear if he has picked a place or if bed available. Suspect transfer not likely today. Some IVR this morning - superimposed on permanent a-fib, asymptomatic. He is on cardizem and lopressor. Remains hyponatremic, but appears euvolemic on exam and getting daily lasix.  Time Spent Directly with Patient:  15 minutes  Length of Stay:  LOS: 18 days   Pixie Casino, MD, [facility] Attending Cardiologist [place] 06/25/2016, 12:48 PM

## 2016-06-26 DIAGNOSIS — Z954 Presence of other heart-valve replacement: Secondary | ICD-10-CM

## 2016-06-26 DIAGNOSIS — I350 Nonrheumatic aortic (valve) stenosis: Secondary | ICD-10-CM

## 2016-06-26 DIAGNOSIS — I480 Paroxysmal atrial fibrillation: Secondary | ICD-10-CM

## 2016-06-26 LAB — CBC WITH DIFFERENTIAL/PLATELET
BASOS PCT: 1 %
Basophils Absolute: 0.1 10*3/uL (ref 0.0–0.1)
EOS ABS: 0.6 10*3/uL (ref 0.0–0.7)
EOS PCT: 8 %
HCT: 28.7 % — ABNORMAL LOW (ref 39.0–52.0)
HEMOGLOBIN: 9.9 g/dL — AB (ref 13.0–17.0)
Lymphocytes Relative: 17 %
Lymphs Abs: 1.2 10*3/uL (ref 0.7–4.0)
MCH: 32.6 pg (ref 26.0–34.0)
MCHC: 34.5 g/dL (ref 30.0–36.0)
MCV: 94.4 fL (ref 78.0–100.0)
MONOS PCT: 11 %
Monocytes Absolute: 0.8 10*3/uL (ref 0.1–1.0)
NEUTROS PCT: 63 %
Neutro Abs: 4.8 10*3/uL (ref 1.7–7.7)
PLATELETS: 199 10*3/uL (ref 150–400)
RBC: 3.04 MIL/uL — ABNORMAL LOW (ref 4.22–5.81)
RDW: 13.3 % (ref 11.5–15.5)
WBC: 7.5 10*3/uL (ref 4.0–10.5)

## 2016-06-26 MED ORDER — METOPROLOL TARTRATE 75 MG PO TABS: 75.0000 mg | ORAL_TABLET | Freq: Two times a day (BID) | ORAL | 3 refills | Status: DC

## 2016-06-26 MED ORDER — BACITRACIN ZINC 500 UNIT/GM EX OINT: 1.0000 "application " | TOPICAL_OINTMENT | Freq: Three times a day (TID) | CUTANEOUS | 0 refills | Status: DC

## 2016-06-26 MED ORDER — PANTOPRAZOLE SODIUM 40 MG PO TBEC: 40.0000 mg | DELAYED_RELEASE_TABLET | Freq: Every day | ORAL | 6 refills | Status: DC

## 2016-06-26 MED ORDER — POTASSIUM CHLORIDE CRYS ER 20 MEQ PO TBCR: 20.0000 meq | EXTENDED_RELEASE_TABLET | Freq: Every day | ORAL | 3 refills | Status: DC

## 2016-06-26 MED ORDER — DOCUSATE SODIUM 100 MG PO CAPS: 100.0000 mg | ORAL_CAPSULE | Freq: Two times a day (BID) | ORAL | 0 refills | Status: DC

## 2016-06-26 MED ORDER — FUROSEMIDE 40 MG PO TABS: 40.0000 mg | ORAL_TABLET | Freq: Every day | ORAL | 3 refills | Status: DC

## 2016-06-26 NOTE — Progress Notes (Signed)
Progress Note  Patient Name: Richard Simon Date of Encounter: 06/26/2016  Primary Cardiologist: Dr. Ellyn Hack TVAR: Dr. Burt Knack  Subjective   Tired and fatigue. Complains of HA.   Inpatient Medications    Scheduled Meds: . aspirin EC  81 mg Oral Daily  . bacitracin  1 application Topical TID  . chlorhexidine  15 mL Mouth Rinse BID  . dabigatran  150 mg Oral Q12H  . diltiazem  240 mg Oral Daily  . docusate sodium  100 mg Oral BID  . feeding supplement (ENSURE ENLIVE)  237 mL Oral TID BM  . furosemide  40 mg Oral Daily  . Gerhardt's butt cream   Topical BID  . hydrocortisone cream   Topical TID  . mouth rinse  15 mL Mouth Rinse BID  . mouth rinse  15 mL Mouth Rinse q12n4p  . metoprolol tartrate  75 mg Oral BID  . pantoprazole  40 mg Oral Daily  . potassium chloride  20 mEq Oral Daily  . rosuvastatin  10 mg Oral Daily  . sodium chloride flush  3 mL Intravenous Q12H  . sodium chloride flush  3 mL Intravenous Q12H   Continuous Infusions:  PRN Meds: sodium chloride, sodium chloride, acetaminophen, ALPRAZolam, guaiFENesin-dextromethorphan, levalbuterol, magnesium hydroxide, ondansetron **OR** ondansetron (ZOFRAN) IV, polyethylene glycol, sodium chloride flush, sodium chloride flush, traMADol, witch hazel-glycerin   Vital Signs    Vitals:   06/25/16 1413 06/25/16 2054 06/25/16 2126 06/26/16 0319  BP: (!) 96/55 122/71  126/77  Pulse: 88 96  98  Resp: 18 18 18 18   Temp: 98.8 F (37.1 C) 98.6 F (37 C)  98.5 F (36.9 C)  TempSrc: Oral Oral  Oral  SpO2: 94% 95%  97%  Weight:    167 lb 11.2 oz (76.1 kg)  Height:        Intake/Output Summary (Last 24 hours) at 06/26/16 0852 Last data filed at 06/25/16 0853  Gross per 24 hour  Intake                0 ml  Output              100 ml  Net             -100 ml   Filed Weights   06/24/16 0500 06/25/16 0531 06/26/16 0319  Weight: 171 lb 4.8 oz (77.7 kg) 172 lb 6.4 oz (78.2 kg) 167 lb 11.2 oz (76.1 kg)    Telemetry    afib  at rate of 90-100s - Personally Reviewed  ECG    N/A  Physical Exam   GEN: No acute distress.   Neck: No JVD Cardiac: IR Ir tachycardiac , no murmurs, rubs, or gallops.  Respiratory: Clear to auscultation bilaterally. GI: Soft, nontender, non-distended  MS: No edema; No deformity. Neuro:  Nonfocal  Psych: Normal affect   Labs    Chemistry Recent Labs Lab 06/22/16 0533  06/22/16 1500 06/22/16 1617 06/23/16 0415 06/25/16 0412  NA 134*  < > 135 136 134* 133*  K 4.1  < > 3.6 3.7 3.8 3.5  CL 97*  < > 97*  --  103 99*  CO2 28  --   --   --  26 28  GLUCOSE 106*  < > 130* 124* 101* 116*  BUN 9  < > 6  --  6 7  CREATININE 0.81  < > 0.60*  --  0.69 0.66  CALCIUM 9.1  --   --   --  8.4* 8.6*  GFRNONAA >60  --   --   --  >60 >60  GFRAA >60  --   --   --  >60 >60  ANIONGAP 9  --   --   --  5 6  < > = values in this interval not displayed.   Hematology Recent Labs Lab 06/24/16 0340 06/25/16 0412 06/26/16 0220  WBC 10.7* 8.1 7.5  RBC 3.31* 3.11* 3.04*  HGB 10.6* 9.8* 9.9*  HCT 31.1* 29.3* 28.7*  MCV 94.0 94.2 94.4  MCH 32.0 31.5 32.6  MCHC 34.1 33.4 34.5  RDW 12.7 12.9 13.3  PLT 229 193 199    Cardiac EnzymesNo results for input(s): TROPONINI in the last 168 hours. No results for input(s): TROPIPOC in the last 168 hours.   BNPNo results for input(s): BNP, PROBNP in the last 168 hours.   DDimer No results for input(s): DDIMER in the last 168 hours.   Radiology    No results found.  Cardiac Studies    TTE 06/23/16 Study Conclusions  - Left ventricle: The cavity size was normal. Posterior wall   thickness was increased in a pattern of mild LVH. Systolic   function was mildly to moderately reduced. The estimated ejection   fraction was in the range of 40% to 45%. Diffuse hypokinesis.   Doppler parameters are consistent with high ventricular filling   pressure. - Aortic valve: A 29 mm Edwards Sapien 3 TAVR bioprosthesis was   present. Valve area (VTI): 1.34  cm^2. Valve area (Vmax): 1.48   cm^2. Valve area (Vmean): 1.52 cm^2. - Mitral valve: Severely calcified annulus. Transvalvular velocity   was within the normal range. There was no evidence for stenosis.   There was mild regurgitation. - Left atrium: The atrium was severely dilated. - Right ventricle: The cavity size was normal. Wall thickness was   normal. Systolic function was normal. - Right atrium: The atrium was mildly dilated. - Tricuspid valve: There was mild regurgitation. - Pulmonary arteries: Systolic pressure was mildly increased. PA   peak pressure: 43 mm Hg (S).   Transcatheter Aortic Valve Replacement - Percutaneous  Transfemoral Approach 06/22/16             Edwards Sapien 3 THV (size 29 mm, model # 9600TFX, serial # [id number])   TEE 06/22/16 Impressions:  - Pre TAVR: EF 40-45% diffuse hypokinesis Biatrial enlargement   Normal RV. No pericardial effusion Normal aortic root. Mild MR   Severely calcified   trileaflet aortic valve. Moderate AR Severe AS Peak velocity 3.8   /.sec, mean gradient 40 mmHg peak gradient 60 mmHg AVA 1.4 cm2.   LAA with severe   dense spontaneous contrast     Post TAVR: EF improved to 50-55%. Less spontaneous contrast in   atria. No aortic root trauma. No pericardial effusion MR remains   mild   Well placed 29 mm Sapien 3 valve. No significant peri valvular   regurgitation Good leaflet motion. Peak velocity 38m/sec, peak   gradient 3 mmHg, mean   gradient 2 mmHg AVA 3.3 cm2.  Carotid doppler 06/15/16 Summary: Bilateral: intimal wall thickening CCA. Mild mixed plaque origin ICA. 1-39% ICA plaquing. Vertebral artery flow is antegrade.  Right Heart Cath and Coronary/Graft Angiography   06/13/16  Conclusion     Echocardiographic evidence of Severe/Critical Aortic Stenosis - now symptomatic  Hemodynamic findings consistent with mild secondary pulmonary hypertension.  Mid LAD lesion, 70 %stenosed. Dist LAD lesion, 80 %stenosed.  Randall Hiss  is  normal in caliber and anatomically normal.  Ost 2nd Diag to 2nd Diag lesion, 95 %stenosed.  SVG-Diag2 is normal in caliber and anatomically normal.  Ost 1st Mrg to 1st Mrg lesion, 100 %stenosed.  Mid Cx lesion, 100 %stenosed.  Seq SVG- OM1-LPL(dCx) and is large and anatomically normal.  Small nondominant RCA patent   4/4 widely patent grafts with severe native coronary disease  Mildly elevated PA pressures with elevated PCWP.  Plan:  Return to nursing unit for ongoing care and management per primary service as well as cardiology consultation team.  TR band removal per protocol  CT surgery has been consulted for evaluation for possible aortic valve replacement (likley TAVR)    Echo 06/08/16 Study Conclusions  - Left ventricle: The cavity size was normal. Systolic function was   mildly reduced. The estimated ejection fraction was in the range   of 45% to 50%. Diffuse hypokinesis. - Aortic valve: Severely calcified annulus. Trileaflet; severely   thickened, severely calcified leaflets. Valve mobility was   restricted. There was critical stenosis. There was moderate   regurgitation. Valve area (VTI): 0.26 cm^2. Valve area (Vmax):   0.32 cm^2. Valve area (Vmean): 0.26 cm^2. - Mitral valve: Moderately calcified annulus. Transvalvular   velocity was within the normal range. There was no evidence for   stenosis. There was mild regurgitation. - Left atrium: The atrium was mildly dilated. - Right ventricle: Systolic function was normal. - Right atrium: The atrium was mildly dilated. - Atrial septum: No defect or patent foramen ovale was identified   by color flow Doppler. - Tricuspid valve: There was trivial regurgitation. - Pulmonary arteries: Systolic pressure was severely increased. PA   peak pressure: 63 mm Hg (S).  Patient Profile     83 y.o. male with permanent atrial fibrillation on Pradaxa,  CAD s/p CABG in 2004 (LIMA to LAD, SVG-Diag, seqSVG-OM1-OM2) , HTN, HLD  and AS.   The patient was admitted June 07, 2016 with progressive exertional dyspnea, orthopnea, and generalized fatigue. He also complained of chest tightness radiating to the upper back. Lab and radiographic studies were suggestive of congestive heart failure with hyponatremia, elevated BNP, and bilateral pulmonary infiltrates. Treated with abx of pneumonia. Cleared by PCCM for surgery. Seen by nephrology for hyponatremia. An echocardiogram demonstrated critical aortic stenosis with a mean gradient of 52 mmHg, calculated valve area of 0.3 cm, and reduction in LV function with an LVEF of 45-50%. Cardiac catheterization demonstrated severe native vessel CAD with patency of his bypass grafts. PA pressure was 48/23/33.    Assessment & Plan    1. Critical aortic stenosis, stage D s/p TVAR 06/22/16 - LVEF 40-45% by echo on 3/9 with normally functioning Sapien 3 bioprosthetic stent valve. Cath site stable.   2. Permanent atrial fibrillation - Post op noted,  idioventricular rhythm for <5 seconds noted on telemetry, asymptomatic. On rate control therapy.  - Rate minimally elevated to 90-100s. Continue Cardizem CD 240mg  qd. Consider increasing BB dose. BP stable.  - Continue Pradaxa for anticoagulation.   3. Acute on chronic combined CHF -  He is euvolemic. Volume status improved after surgery. No dyspnea. Continue lasix 40mg  qd and supplemental K.  - Continue BB. His Lisinopril was hold during admission. Consider resuming during follow up.   4. CAD s/p CABG x4 - Cath this admission showed patent grafts. Continue ASA and statin.   Dispo: discharge to SNF when bed available. Will review discharge meds with MD.    Jarrett Soho, PA  06/26/2016, 8:52 AM    Agree with note by Robbie Lis PA-C  Postop day #4 the TAVR with a Sapian 3 aortic valve. He appears to be euvolemic. His sodium is 133. He denies chest pain or shortness of breath. He does have runs of A. fib and is on Pradaxa.  His exam is benign. Plan is to discharge to a skilled nursing facility for rehabilitation. Follow-up with Drs. Noemi Chapel and [place] as an outpatient.  Lorretta Harp, M.D., [place], [facility], [place] [address] [place], [place]  [address]  [phone] 06/26/2016 10:37 AM

## 2016-06-26 NOTE — Care Management Important Message (Signed)
Important Message  Patient Details  Name: Richard Simon MRN: [id number] Date of Birth: [birth year]   Medicare Important Message Given:  Yes    Nathen May 06/26/2016, 1:14 PM

## 2016-06-26 NOTE — Progress Notes (Signed)
CARDIAC REHAB PHASE I   PRE:  Rate/Rhythm: 105 afib  BP:  Supine:   Sitting: 124/60  Standing:    SaO2: 94-97%RA  MODE:  Ambulation: 550 ft   POST:  Rate/Rhythm: 130-136 afib  BP:  Supine:   Sitting: 150/72  Standing:    SaO2: 89-97%RA [id number] Set pt up for breakfast this morning and returned to walk. Then pt getting bath. Returned for third time and pt ready to walk. Pt stated he was cold with recliner next to window. We moved recliner to other side of bed. Pt walked 550 ft on RA with rolling walker and asst x 2. Could be asst x 1. Heart rate elevated with walk. Pt requested to brush teeth. Assisted pt with mouth care and gave 2 blankets. Has call bell. Encouraged to call if he needs to get up. PT to see later.   Graylon Good, RN BSN  06/26/2016 10:00 AM

## 2016-06-26 NOTE — Progress Notes (Addendum)
      [address]       [place] [address]             [phone]        4 Days Post-Op Procedure(s) (LRB): TRANSCATHETER AORTIC VALVE REPLACEMENT, TRANSFEMORAL (N/A) TRANSESOPHAGEAL ECHOCARDIOGRAM (TEE) (N/A)  Subjective: Awakened from sleep this am. Has had intermittent mild confusion.  Objective: Vital signs in last 24 hours: Temp:  [98.5 F (36.9 C)-98.8 F (37.1 C)] 98.5 F (36.9 C) (03/12 0319) Pulse Rate:  [88-98] 98 (03/12 0319) Cardiac Rhythm: Normal sinus rhythm (03/11 2126) Resp:  [18] 18 (03/12 0319) BP: (96-126)/(55-77) 126/77 (03/12 0319) SpO2:  [94 %-97 %] 97 % (03/12 0319) Weight:  [76.1 kg (167 lb 11.2 oz)] 76.1 kg (167 lb 11.2 oz) (03/12 0319)   Current Weight  06/26/16 76.1 kg (167 lb 11.2 oz)       Intake/Output from previous day: 03/11 0701 - 03/12 0700 In: -  Out: 100 [Urine:100]   Physical Exam:  Cardiovascular: RRR Pulmonary: Clear to auscultation bilaterally Abdomen: Soft, non tender, bowel sounds present. Extremities: Warm Wounds: Bilateral groin dressings are clean and dry.    Lab Results: CBC: Recent Labs  06/25/16 0412 06/26/16 0220  WBC 8.1 7.5  HGB 9.8* 9.9*  HCT 29.3* 28.7*  PLT 193 199   BMET:  Recent Labs  06/25/16 0412  NA 133*  K 3.5  CL 99*  CO2 28  GLUCOSE 116*  BUN 7  CREATININE 0.66  CALCIUM 8.6*    PT/INR:  Lab Results  Component Value Date   INR 1.39 06/22/2016   INR 1.24 06/22/2016   INR 1.29 06/11/2016   ABG:  INR: Will add last result for INR, ABG once components are confirmed Will add last 4 CBG results once components are confirmed  Assessment/Plan:  1. CV - Previous a fib. On Cardizem CD 240 mg daily, Lopressor 75 mg bid, and Pradaxa 150 mg bid. 2.  Pulmonary - On room air. 3. Volume Overload - On Lasix 40 mg daily. 4. Anemia-H and H stable at 9.9 and 28.7 5.  Will need SNF-management per cardiology   ZIMMERMAN,DONIELLE MPA-C 06/26/2016,7:39 AM   Chart  reviewed, patient examined, agree with above. He is ambulating better and ready for SNF.

## 2016-06-26 NOTE — Discharge Summary (Addendum)
Discharge Summary    Patient ID: Richard Simon,  MRN: [id number], DOB/AGE: 04/19/1929 83 y.o.  Admit date: 06/07/2016 Discharge date: 06/27/2016  Primary Care Provider: [facility] Primary Cardiologist: Dr. Ellyn Hack TVAR: Dr. Burt Knack  Discharge Diagnoses    Principal Problem:   Hyponatremia Active Problems:   Severe aortic stenosis   Essential hypertension   Dyslipidemia, goal LDL below 70   Atrial fibrillation, permanent (HCC)   CAD (coronary artery disease)   Chest pain   SOB (shortness of breath)   Acute encephalopathy   Chronic diastolic heart failure (HCC)   Dental caries   Chronic periodontitis   Allergies Allergies  Allergen Reactions  . No Known Allergies     Diagnostic Studies/Procedures    TTE 06/23/16 Study Conclusions  - Left ventricle: The cavity size was normal. Posterior wall thickness was increased in a pattern of mild LVH. Systolic function was mildly to moderately reduced. The estimated ejection fraction was in the range of 40% to 45%. Diffuse hypokinesis. Doppler parameters are consistent with high ventricular filling pressure. - Aortic valve: A 29 mm Edwards Sapien 3 TAVR bioprosthesis was present. Valve area (VTI): 1.34 cm^2. Valve area (Vmax): 1.48 cm^2. Valve area (Vmean): 1.52 cm^2. - Mitral valve: Severely calcified annulus. Transvalvular velocity was within the normal range. There was no evidence for stenosis. There was mild regurgitation. - Left atrium: The atrium was severely dilated. - Right ventricle: The cavity size was normal. Wall thickness was normal. Systolic function was normal. - Right atrium: The atrium was mildly dilated. - Tricuspid valve: There was mild regurgitation. - Pulmonary arteries: Systolic pressure was mildly increased. PA peak pressure: 43 mm Hg (S).   Transcatheter Aortic Valve Replacement - Percutaneous Transfemoral Approach 06/22/16 Edwards Sapien 3 THV (size  81mm, model # [id number], serial # [id number])   TEE 06/22/16 Impressions:  - Pre TAVR: EF 40-45% diffuse hypokinesis Biatrial enlargement Normal RV. No pericardial effusion Normal aortic root. Mild MR Severely calcified trileaflet aortic valve. Moderate AR Severe AS Peak velocity 3.8 /.sec, mean gradient 40 mmHg peak gradient 60 mmHg AVA 1.4 cm2. LAA with severe dense spontaneous contrast  Post TAVR: EF improved to 50-55%. Less spontaneous contrast in atria. No aortic root trauma. No pericardial effusion MR remains mild Well placed 29 mm Sapien 3 valve. No significant peri valvular regurgitation Good leaflet motion. Peak velocity 18m/sec, peak gradient 3 mmHg, mean gradient 2 mmHg AVA 3.3 cm2.  Carotid doppler 06/15/16 Summary: Bilateral: intimal wall thickening CCA. Mild mixed plaque origin ICA. 1-39% ICA plaquing. Vertebral artery flow is antegrade.  Right Heart Cath and Coronary/Graft Angiography   06/13/16  Conclusion     Echocardiographic evidence of Severe/Critical Aortic Stenosis - now symptomatic  Hemodynamic findings consistent with mild secondary pulmonary hypertension.  Mid LAD lesion, 70 %stenosed. Dist LAD lesion, 80 %stenosed.  LIMA-dLAD is normal in caliber and anatomically normal.  Ost 2nd Diag to 2nd Diag lesion, 95 %stenosed.  SVG-Diag2 is normal in caliber and anatomically normal.  Ost 1st Mrg to 1st Mrg lesion, 100 %stenosed.  Mid Cx lesion, 100 %stenosed.  Seq SVG- OM1-LPL(dCx) and is large and anatomically normal.  Small nondominant RCA patent  4/4 widely patent grafts with severe native coronary disease  Mildly elevated PA pressures with elevated PCWP.  Plan:  Return to nursing unit for ongoing care and management per primary service as well as cardiology consultation team.  TR band removal per protocol  CT surgery has been consulted for evaluation  for possible aortic valve replacement (likley TAVR)     Echo 06/08/16 Study Conclusions  - Left ventricle: The cavity size was normal. Systolic function was mildly reduced. The estimated ejection fraction was in the range of 45% to 50%. Diffuse hypokinesis. - Aortic valve: Severely calcified annulus. Trileaflet; severely thickened, severely calcified leaflets. Valve mobility was restricted. There was critical stenosis. There was moderate regurgitation. Valve area (VTI): 0.26 cm^2. Valve area (Vmax): 0.32 cm^2. Valve area (Vmean): 0.26 cm^2. - Mitral valve: Moderately calcified annulus. Transvalvular velocity was within the normal range. There was no evidence for stenosis. There was mild regurgitation. - Left atrium: The atrium was mildly dilated. - Right ventricle: Systolic function was normal. - Right atrium: The atrium was mildly dilated. - Atrial septum: No defect or patent foramen ovale was identified by color flow Doppler. - Tricuspid valve: There was trivial regurgitation. - Pulmonary arteries: Systolic pressure was severely increased. PA peak pressure: 63 mm Hg (S).    History of Present Illness     83 y.o. male with permanent atrial fibrillation on Pradaxa,  CAD s/p CABG in 2004 (LIMA to LAD, SVG-Diag, seqSVG-OM1-OM2) , HTN, HLD and AS who admitted June 07, 2016 with progressive exertional dyspnea, orthopnea, and generalized fatigue x 1 week.   He also complained of chest tightness radiating to the upper back. In the ED CXR was concerning for infectious process in the upper lobes, no edema. Labs showed Na+ 119, BNP 277, Hgb 11.8, Trop negx3. He was started on antibiotics for CAP. Also given gentle IV hydration for hyponatremia. Cardiology was consulted in regards to his reports of chest pain and AS.    Hospital Course     Consultants: IM as attending initially, nephrology, CTCA, dental and critical care  1. CAP Treated with abx of pneumonia. He had CT of chest/abdomen/pelvis as part of his  workup that showed extensive GGO's with septal thickening suggestive of infectious process. Seen by PCCM and felt resolving penumonia. Cleared for surgery with out any intervention.   2. Hyponatremia - Initially consistent with volume depletion (low UNa and low Uosm with some component of reset osmostat as Uosm was not maximally dillute at 222) but sodium worsened with IVF's. Seen by nephrology. Hold ACE. Improved with diuretics. Finding consistent with hypervolemia hyponatremia  3. Aortic stenosis - An echocardiogram demonstrated critical aortic stenosis with a mean gradient of 52 mmHg, calculated valve area of 0.3 cm, and reduction in LV function with an LVEF of 45-50%. Cardiac catheterization demonstrated severe native vessel CAD with patency of his bypassgrafts.PA pressure was 48/23/33. The patient was seen by Dr. Burt Knack and Dr. Cyndia Bent. Now s/p TVAR 06/22/16. LVEF 40-45% by echo on 3/9 with normally functioning Sapien 3 bioprosthetic stent valve. Cath site stable. No complications.   4. Permanent atrial fibrillation - Post op noted,  idioventricular rhythm for <5 seconds noted on telemetry, asymptomatic. On rate control therapy with Cardizem CD 240mg  qd and metoprolol 75mg  BID. Rate minimally elevated to 100s -110s with activity.  Unable to uptitrate BB base soft low BP. Continue Pradaxa for anticoagulation.   5. Acute on chronic combined CHF -  Echo as above. Initially had pulmonary edema. Initially treated with IV diuresis and later transition to po lasix.   Volume status improved after surgery. No dyspnea. Continue lasix 40mg  qd and supplemental K.  - Continue BB. His Lisinopril was hold during admission. Consider resuming during follow up.   6. CAD s/p CABG x4 - Cath this admission showed  patent grafts. Continue ASA and statin. No chest pain.   The patient was seen by PT and recommended SNF. Unable to discharge yesterday due to bed availability. He is feeling better today. HA improved  after BM and walking. Patient states that "no need for neurology evaluation". His symptoms likely due to lack of sleep and loss of appetitive.   The patient has been seen by Dr. Gwenlyn Found  today and deemed ready for discharge home. All follow-up appointments have been scheduled. Discharge medications are listed below.    Discharge Vitals Blood pressure 121/80, pulse 90, temperature 98.3 F (36.8 C), temperature source Oral, resp. rate 18, height 5\' 9"  (1.753 m), weight 164 lb 9.6 oz (74.7 kg), SpO2 97 %.  Filed Weights   06/25/16 0531 06/26/16 0319 06/27/16 0354  Weight: 172 lb 6.4 oz (78.2 kg) 167 lb 11.2 oz (76.1 kg) 164 lb 9.6 oz (74.7 kg)    Labs & Radiologic Studies     CBC  Recent Labs  06/26/16 0220 06/27/16 0212  WBC 7.5 8.0  NEUTROABS 4.8 5.2  HGB 9.9* 10.2*  HCT 28.7* 30.6*  MCV 94.4 93.9  PLT 199 097   Basic Metabolic Panel  Recent Labs  06/25/16 0412  NA 133*  K 3.5  CL 99*  CO2 28  GLUCOSE 116*  BUN 7  CREATININE 0.66  CALCIUM 8.6*   Liver Function Tests No results for input(s): AST, ALT, ALKPHOS, BILITOT, PROT, ALBUMIN in the last 72 hours. No results for input(s): LIPASE, AMYLASE in the last 72 hours. Cardiac Enzymes No results for input(s): CKTOTAL, CKMB, CKMBINDEX, TROPONINI in the last 72 hours. BNP Invalid input(s): POCBNP D-Dimer No results for input(s): DDIMER in the last 72 hours. Hemoglobin A1C No results for input(s): HGBA1C in the last 72 hours. Fasting Lipid Panel No results for input(s): CHOL, HDL, LDLCALC, TRIG, CHOLHDL, LDLDIRECT in the last 72 hours. Thyroid Function Tests No results for input(s): TSH, T4TOTAL, T3FREE, THYROIDAB in the last 72 hours.  Invalid input(s): FREET3  Dg Orthopantogram  Result Date: 06/16/2016 CLINICAL DATA:  Aortic stenosis. EXAM: ORTHOPANTOGRAM/PANORAMIC COMPARISON:  CT 01/03/2009. FINDINGS: Mild periapical lucency lucency is noted over the left lower most posterior remaining tooth. Subtle lucencies  noted over the posterior most aspect of the left inferior mandibular ramus. Dental evaluation suggested. No other focal bony abnormalities identified. No evidence of fracture. IMPRESSION: Mild periapical lucency noted over the left lower most posterior remaining tooth. Subtle lucencies noted over the posterior aspect of the left inferior mandibular ramus. Dental evaluation suggested. Electronically Signed   By: Marcello Moores  Register   On: 06/16/2016 15:20   Dg Chest 2 View  Result Date: 06/07/2016 CLINICAL DATA:  Chest pain with shortness of breath EXAM: CHEST  2 VIEW COMPARISON:  12/27/2010 FINDINGS: Post sternotomy changes. Mild hyperinflation. Diffuse coarsening of the lung interstitium, suspect chronic change. Hazy opacity within the bilateral upper lobes suspicious for superimposed infection or inflammatory process. Tiny bilateral effusions. Mild cardiomegaly. No pneumothorax. Degenerative changes of the spine. IMPRESSION: 1. Diffuse coarsening of the lung interstitium, suspect chronic interstitial changes. Hazy opacities are present within the bilateral upper lobes and are suspicious for superimposed infectious or inflammatory process. 2. Mild cardiomegaly Electronically Signed   By: Donavan Foil M.D.   On: 06/07/2016 01:16   Ct Cardiac Morph/pulm Vein W/cm&w/o Ca Score  Addendum Date: 06/16/2016   ADDENDUM REPORT: 06/16/2016 09:50 EXAM: OVER-READ INTERPRETATION  CT CHEST The following report is an over-read performed by radiologist Dr. Quillian Quince  [facility], [place] on 06/16/2016. This over-read does not include interpretation of cardiac or coronary anatomy or pathology. The cardiac CT interpretation by the cardiologist is attached. COMPARISON:  None. FINDINGS: Full description of extracardiac findings on separate dictation for contemporaneously obtained CTA of the chest, abdomen and pelvis 06/15/2016. IMPRESSION: See separate dictation for contemporaneously obtained CTA of the chest, abdomen and  pelvis 06/15/2016 for full description of extracardiac findings. Electronically Signed   By: Vinnie Langton M.D.   On: 06/16/2016 09:50   Result Date: 06/16/2016 CLINICAL DATA:  83 year old male with severe aortic stenosis. EXAM: Cardiac TAVR CT TECHNIQUE: The patient was scanned on a Philips 256 scanner. A 120 kV retrospective scan was triggered in the descending thoracic aorta at 111 HU's. Gantry rotation speed was 270 msecs and collimation was .9 mm. 20 mg of iv Metoprolol and no nitro were given. The 3D data set was reconstructed in 5% intervals of the R-R cycle. Systolic and diastolic phases were analyzed on a dedicated work station using MPR, MIP and VRT modes. The patient received 80 cc of contrast. FINDINGS: Aortic Valve: Trileaflet, severely calcified and thickened with severely restricted leaflet opening and minimal calcifications extending into the LVOT. Aorta:  Normal size, no dissection.  Mild diffuse calcifications. Sinotubular Junction:  30 x 30 mm Ascending Thoracic Aorta:  33 x 32 mm Aortic Arch:  24 x 24 mm Descending Thoracic Aorta:  24 x 24 mm Sinus of Valsalva Measurements: Non-coronary:  37 mm Right -coronary:  33 mm Left -coronary:  34 mm Coronary Artery Height above Annulus: Left Main:  16 mm Right Coronary:  24 mm Virtual Basal Annulus Measurements: Maximum/Minimum Diameter:  32 x 22 mm Perimeter:  94 mm Area:  596 mm2 Optimum Fluoroscopic Angle for Delivery:  RAO 2 CRA 1 Dilated pulmonary artery measuring 32 x 29 mm No thrombus in the left atrial appendage. Normal pulmonary vein drainage into the left atrium. No ASD/VSD seen. IMPRESSION: 1. Trileaflet, severely calcified and thickened with severely restricted leaflet opening and minimal calcifications extending into the LVOT. Annular measurements suitable for delivery of a 29 mm Edwards-SAPIEN 3 TAVR valve. 2. Optimum Fluoroscopic Angle for Delivery:  RAO 2 CRA 1 Ena Dawley Electronically Signed: By: Ena Dawley On:  06/15/2016 18:06   Dg Chest Port 1 View  Result Date: 06/23/2016 CLINICAL DATA:  Severe aortic stenosis status post TAVR EXAM: PORTABLE CHEST 1 VIEW COMPARISON:  Chest radiograph from one day prior. FINDINGS: Stable configuration of right internal jugular central venous catheter, median sternotomy wires and aortic valve prosthesis. Stable cardiomediastinal silhouette with mild cardiomegaly and aortic atherosclerosis. No pneumothorax. Small bilateral pleural effusions appear slightly increased bilaterally. Mild-to-moderate pulmonary edema appears stable. IMPRESSION: 1. No pneumothorax . 2. Small bilateral pleural effusions, slightly increased bilaterally. 3. Mild-to-moderate congestive heart failure, stable . 4. Aortic atherosclerosis. Electronically Signed   By: Ilona Sorrel M.D.   On: 06/23/2016 09:08   Dg Chest Port 1 View  Result Date: 06/22/2016 CLINICAL DATA:  Severe aortic stenosis. EXAM: PORTABLE CHEST 1 VIEW COMPARISON:  06/19/2016 FINDINGS: Sequelae of prior CABG and interval transcatheter aortic valve replacement are identified. A right jugular catheter terminates over the SVC. The cardiomediastinal silhouette is unchanged. Diffuse interstitial coarsening with bilateral upper lobe predominant airspace opacities do not appear significantly changed. No sizable pleural effusion or pneumothorax is identified. IMPRESSION: 1. Interval TAVR.  Right jugular catheter overlies SVC. 2. Unchanged bilateral upper lobe predominant airspace disease. Electronically Signed   By: Zenia Resides  Jeralyn Ruths M.D.   On: 06/22/2016 16:29   Dg Chest Port 1 View  Result Date: 06/19/2016 CLINICAL DATA:  Shortness of breath and congestion EXAM: PORTABLE CHEST 1 VIEW COMPARISON:  06/09/2016, 06/15/2016 FINDINGS: Cardiac shadow is mildly enlarged but stable. Postsurgical changes are again seen. Diffuse changes in the upper lobes are noted bilaterally similar to that seen on recent chest x-ray and recent CT examination. No bony abnormality  is noted. IMPRESSION: Stable changes in the upper lobes bilaterally. No new focal abnormality is seen. Electronically Signed   By: Inez Catalina M.D.   On: 06/19/2016 14:07   Dg Chest Port 1 View  Result Date: 06/09/2016 CLINICAL DATA:  Shortness of breath. EXAM: PORTABLE CHEST 1 VIEW COMPARISON:  06/09/2016 at 7:35 a.m. FINDINGS: Sternotomy wires unchanged. Lungs are adequately inflated with continued airspace consolidation most prominent over the upper lobes right worse than left which is slightly worse over the right mid to upper lung. Minimal patchy bibasilar opacification unchanged. Possible small amount of bilateral pleural fluid unchanged. Mild stable cardiomegaly. Calcified plaque over the aortic arch. Remainder of the exam is unchanged. IMPRESSION: Multifocal airspace process worse over the upper lobes with possible slight interval worsening over the right upper lobe. Findings are likely due to infection. Possible small amount of bilateral pleural fluid. Mild stable cardiomegaly.  Aortic atherosclerosis. Electronically Signed   By: Marin Olp M.D.   On: 06/09/2016 21:05   Dg Chest Port 1 View  Result Date: 06/09/2016 CLINICAL DATA:  Shortness of Breath EXAM: PORTABLE CHEST 1 VIEW COMPARISON:  June 07, 2016 and December 27, 2010 FINDINGS: There is diffuse interstitial prominence, primarily in the upper lobes superimposed on chronic interstitial thickening. There is patchy airspace opacity in the upper lobes and left mid lung region superimposed on interstitial edema. There is cardiomegaly with pulmonary venous hypertension. There are small pleural effusions bilaterally. There is atherosclerotic calcification in the aorta. Patient is status post coronary artery bypass grafting. No bone lesions. No adenopathy. IMPRESSION: Findings felt to represent congestive heart failure superimposed on chronic interstitial thickening. Relative airspace opacity in portions of the upper lobes likely represents  alveolar edema. A degree of superimposed pneumonia in the upper lobes cannot be excluded radiographically. There is aortic atherosclerosis. Appearance is essentially stable compared to 1 day prior. Electronically Signed   By: Lowella Grip III M.D.   On: 06/09/2016 08:04   Ct Angio Chest/abd/pel For Dissection W And/or W/wo  Result Date: 06/16/2016 CLINICAL DATA:  83 year old male with history of severe aortic stenosis. Preprocedural study prior to potential transcatheter aortic valve replacement (TAVR). EXAM: CT ANGIOGRAPHY CHEST, ABDOMEN AND PELVIS TECHNIQUE: Multidetector CT imaging through the chest, abdomen and pelvis was performed using the standard protocol during bolus administration of intravenous contrast. Multiplanar reconstructed images and MIPs were obtained and reviewed to evaluate the vascular anatomy. CONTRAST:  80 mL of Isovue 370. COMPARISON:  No priors. FINDINGS: CTA CHEST FINDINGS Cardiovascular: Heart size is mildly enlarged with biatrial dilatation. There is no significant pericardial fluid, thickening or pericardial calcification. There is aortic atherosclerosis, as well as atherosclerosis of the great vessels of the mediastinum and the coronary arteries, including calcified atherosclerotic plaque in the left main, left anterior descending and left circumflex coronary arteries. Status post median sternotomy for CABG, including LIMA to the LAD. Mediastinum/Lymph Nodes: Multiple prominent borderline enlarged mediastinal and hilar lymph nodes are noted, likely reactive. Esophagus is unremarkable in appearance. No axillary lymphadenopathy. Lungs/Pleura: Moderate right and small left pleural effusions lie  dependently. Throughout the lungs bilaterally there are relatively symmetric areas of ground-glass attenuation and septal thickening which are most evident throughout the mid to upper lungs. Lower lungs appear relatively normal, with a few patchy areas of ground-glass attenuation.  Musculoskeletal/Soft Tissues: Median sternotomy wires. There are no aggressive appearing lytic or blastic lesions noted in the visualized portions of the skeleton. CTA ABDOMEN AND PELVIS FINDINGS Hepatobiliary: Liver has a slightly shrunken appearance and nodular contour, suggestive of underlying cirrhosis. No cystic or solid hepatic lesions. No intra or extrahepatic biliary ductal dilatation. Gallbladder is normal in appearance. Pancreas: No pancreatic mass. No pancreatic ductal dilatation. No pancreatic or peripancreatic fluid or inflammatory changes. Spleen: Unremarkable. Adrenals/Urinary Tract: Bilateral kidneys and bilateral adrenal glands are unremarkable in appearance. No hydroureteronephrosis. Small filling defect in the urinary bladder measuring 0.6 x 1.3 x 1.6 cm (axial image 254 of series 401 and coronal image 108 of series 403), with potential stalk like attachment to the wall of the urinary bladder (coronal image 108 of series 403). There is also some plaque-like irregularity along the posterior aspect of the urinary bladder wall, best appreciated on sagittal image 124 of series 404. Stomach/Bowel: The appearance of the stomach is normal. There is no pathologic dilatation of small bowel or colon. Vascular/Lymphatic: Aortic atherosclerosis, with vascular findings and measurements pertinent to potential TAVR procedure, as detailed below. 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 Throughout the abdominal aorta there multiple ulcerated plaques. The largest of these plaques is best demonstrated in cross-section on axial image 200 of series 401 adjacent to the inferior mesenteric artery origin, where it appears as a short segment dissection (however, this does not appear to be a true dissection). 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 The celiac axis, superior mesenteric artery and inferior mesenteric artery are all patent without definite hemodynamically significant stenosis. Two right-sided and one left-sided renal arteries are all patent without hemodynamically significant stenosis. No lymphadenopathy noted in the abdomen or pelvis. Reproductive: Prostate gland and seminal vesicles are unremarkable in appearance. Other: No significant volume of ascites.  No pneumoperitoneum. Musculoskeletal: There are no aggressive appearing lytic or blastic lesions noted in the visualized portions of the skeleton. VASCULAR MEASUREMENTS PERTINENT TO TAVR: AORTA: Minimal Aortic Diameter -  19 x 15 mm Severity of Aortic Calcification -  moderate to severe RIGHT PELVIS: Right Common Iliac Artery - Minimal Diameter - 11.2 x 8.3 mm Tortuosity - severe Calcification - moderate Right External Iliac Artery - Minimal Diameter - 8.7 x 9.1 mm Tortuosity - severe Calcification - mild Right Common Femoral Artery - Focal aneurysmal dilatation up to 17 mm with large ulcerated plaque. Minimal Diameter - 8.5 x 6.3 mm Tortuosity - mild Calcification - moderate LEFT PELVIS: Left Common Iliac Artery - Minimal Diameter - 8.1 x 7.8 mm Tortuosity - severe Calcification - moderate Left External Iliac Artery - Minimal Diameter - 9.1 x 8.5 mm Tortuosity - severe Calcification - mild Left Common Femoral Artery - Minimal Diameter - 8.7 x 7.6 mm Tortuosity - mild Calcification - moderate Review of the MIP images confirms the above findings. IMPRESSION: 1. Vascular findings and measurements pertinent to potential TAVR procedure, as detailed above. From a size criteria, the patient has suitable pelvic arterial access bilaterally. 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 In addition, there is a large ulcerated plaque in the infrarenal abdominal aorta (adjacent to the origin of the inferior mesenteric artery), best appreciated on axial image 200 of series 401, which in the axial plane has an appearance of a very short segment dissection. Caution should be taken when traversing this region with a catheter. 2. Severe thickening calcification of the aortic valve, compatible with the reported clinical history of severe aortic stenosis. 3. Extensive ground-glass attenuation and septal thickening throughout the mid to upper lungs bilaterally, strongly favored to be related to an acute infection. There may also be a background of very mild interstitial pulmonary edema, best appreciated in the lower lungs. 4. Moderate right and small left pleural effusions lying dependently. 5. Plaque-like irregularity along the posterior aspect of the urinary bladder wall, adjacent to a small filling defect which may have an attachment to the wall of the urinary bladder. These findings are concerning for potential urothelial neoplasm, and follow-up nonemergent evaluation by Urology is suggested in the near future to better evaluate these findings. 6. Morphologic changes in the liver suggestive of early cirrhosis, as above. 7. Additional incidental findings, as above. Electronically Signed   By: Vinnie Langton M.D.   On: 06/16/2016 09:01    Disposition   Pt is being discharged home today in good condition.  Follow-up Plans & Appointments     Contact information for follow-up providers    Glenetta Hew, MD Follow up on 07/05/2016.   Specialty:  Cardiology Why:  @9 :45am for post hospital  Contact information: [address] [place] [address] [phone]            Contact information for after-discharge care    Destination    [facility] Follow up.   Specialty:  [place]  information: [address] [place] [phone]                 Discharge Instructions    Diet - low sodium heart healthy    Complete by:  As directed    Discharge instructions    Complete by:  As directed    No driving until cleared cleared by Dr.Harding.   Increase activity slowly    Complete by:  As directed       Discharge Medications   Current Discharge Medication List    START taking these medications   Details  bacitracin ointment Apply 1 application topically 3 (three) times daily. Qty: 120 g, Refills: 0    docusate sodium (COLACE) 100 MG capsule Take 1 capsule (100 mg total) by mouth 2 (two) times daily. Qty: 10 capsule, Refills: 0    furosemide (LASIX) 40 MG tablet Take 1 tablet (40 mg total) by mouth daily. Qty: 30 tablet, Refills: 3    Metoprolol Tartrate 75 MG TABS Take 75 mg by mouth 2 (two) times daily. Qty: 60 tablet, Refills: 3    pantoprazole (PROTONIX) 40 MG tablet Take 1 tablet (40 mg total) by mouth daily. Qty: 30 tablet, Refills: 6    potassium chloride SA (K-DUR,KLOR-CON) 20 MEQ tablet Take 1 tablet (20 mEq total) by mouth daily. Qty: 30 tablet, Refills: 3      CONTINUE these medications which have NOT CHANGED   Details  Acetaminophen  500 MG coapsule Take 1 tablet by mouth every 6 (six) hours as needed for pain.     aspirin EC 81 MG tablet Take 81 mg by mouth daily.    CARTIA XT 240 MG 24 hr capsule TAKE 1 CAPSULE EVERY DAY Qty: 90 capsule, Refills: 2    co-enzyme Q-10 30 MG capsule Take 1 capsule by mouth daily.    dabigatran (PRADAXA) 150 MG CAPS capsule Take 1 capsule (150 mg total) by mouth 2 (two) times daily. Qty: 180 capsule, Refills: 3    Omega-3 Fatty Acids (FISH OIL) 1200 MG CAPS Take 1,200 mg by mouth 2 (two) times daily.    rosuvastatin (CRESTOR) 10 MG tablet TAKE 1 TABLET (10 MG TOTAL) BY MOUTH DAILY. Qty: 90 tablet, Refills: 0      STOP taking these medications     atenolol (TENORMIN)  50 MG tablet      lisinopril (PRINIVIL,ZESTRIL) 10 MG tablet         Outstanding Labs/Studies   BEMT during post hospital   Duration of Discharge Encounter   Greater than 30 minutes including physician time.  Signed, Bhagat,Bhavinkumar PA-C 06/27/2016, 10:25 AM   Agree with note by Robbie Lis PA-C  Pt is S/P TAVR. OK for DC to SNF. Exam benign this AM. On approp meds. ROV with Drs Burt Knack and Ellyn Hack.   Lorretta Harp, M.D., [place], [facility], [place] [address] [place], [place]  [id number]  [phone] 06/27/2016 10:25 AM

## 2016-06-26 NOTE — Progress Notes (Signed)
Physical Therapy Treatment Patient Details Name: Richard Simon MRN: [id number] DOB: 12-10-1929 Today's Date: 06/26/2016    History of Present Illness Pt is a 83 y.o. male with medical history significant of atrial fibrillation on Pradaxa, CAD, CABG, hyperlipidemia, hypertension, moderate to severe aortic stenosis is coming to the emergency department with complaints of on/off chest pressure, associated with dyspnea, orthopnea and fatigue. He was diagnosed with hyponatremia. PLan for TAVR 3/8.    PT Comments    Pt progressing well toward goals with increase in ambulation distance and less physical assist required for mobility. Current d/c recommendation remains appropriate when medically ready. PT will continue to follow.    Follow Up Recommendations  SNF;Supervision for mobility/OOB     Equipment Recommendations  Rolling walker with 5" wheels    Recommendations for Other Services       Precautions / Restrictions Precautions Precautions: Fall Restrictions Weight Bearing Restrictions: No    Mobility  Bed Mobility Overal bed mobility: Needs Assistance Bed Mobility: Supine to Sit;Sit to Supine     Supine to sit: Supervision Sit to supine: Supervision   General bed mobility comments: supervision for safety, increased time  Transfers Overall transfer level: Needs assistance Equipment used: Rolling walker (2 wheeled) Transfers: Sit to/from Stand Sit to Stand: Min assist         General transfer comment: minA to power up from EOB, verbal cues for technique  Ambulation/Gait Ambulation/Gait assistance: Min guard Ambulation Distance (Feet): 400 Feet Assistive device: Rolling walker (2 wheeled) Gait Pattern/deviations: Step-through pattern;Decreased step length - right;Decreased step length - left;Decreased stride length;Trunk flexed Gait velocity: decreased Gait velocity interpretation: Below normal speed for age/gender General Gait Details: v/c for upright posture and  safety with RW, wide turns with verbal cues for technique with RW   Stairs            Wheelchair Mobility    Modified Rankin (Stroke Patients Only)       Balance Overall balance assessment: Needs assistance Sitting-balance support: Feet supported;No upper extremity supported Sitting balance-Leahy Scale: Good Sitting balance - Comments: sat EOB x 2 min   Standing balance support: Bilateral upper extremity supported Standing balance-Leahy Scale: Poor Standing balance comment: reliant on RW                    Cognition Arousal/Alertness: Awake/alert Behavior During Therapy: WFL for tasks assessed/performed Overall Cognitive Status: Within Functional Limits for tasks assessed                      Exercises      General Comments        Pertinent Vitals/Pain Pain Assessment: No/denies pain    Home Living                      Prior Function            PT Goals (current goals can now be found in the care plan section) Acute Rehab PT Goals Patient Stated Goal: get better Progress towards PT goals: Progressing toward goals    Frequency    Min 3X/week      PT Plan Current plan remains appropriate    Co-evaluation             End of Session Equipment Utilized During Treatment: Gait belt Activity Tolerance: Patient tolerated treatment well Patient left: in bed;with call bell/phone within reach Nurse Communication: Mobility status PT Visit Diagnosis: Unsteadiness on feet (R26.81);Other abnormalities  of gait and mobility (R26.89);Muscle weakness (generalized) (M62.81);Difficulty in walking, not elsewhere classified (R26.2)     Time: 1131-1150 PT Time Calculation (min) (ACUTE ONLY): 19 min  Charges:  $Gait Training: 8-22 mins                    G Codes:       Tracie Harrier Jul 03, 2016, 1:11 PM   Tracie Harrier, SPT Acute Rehab SPT [phone]

## 2016-06-26 NOTE — Progress Notes (Signed)
1420-1430 Pt not interested in referral to [facility] Phase 2. Gave low sodium diet and encouraged light walking for ex. Gave modified walking instructions. Pt for SNF. Graylon Good RN BSN 06/26/2016 2:29 PM

## 2016-06-27 DIAGNOSIS — Z23 Encounter for immunization: Secondary | ICD-10-CM | POA: Diagnosis not present

## 2016-06-27 DIAGNOSIS — J189 Pneumonia, unspecified organism: Secondary | ICD-10-CM | POA: Diagnosis not present

## 2016-06-27 DIAGNOSIS — I5042 Chronic combined systolic (congestive) and diastolic (congestive) heart failure: Secondary | ICD-10-CM | POA: Diagnosis not present

## 2016-06-27 DIAGNOSIS — M6281 Muscle weakness (generalized): Secondary | ICD-10-CM | POA: Diagnosis not present

## 2016-06-27 DIAGNOSIS — I482 Chronic atrial fibrillation: Secondary | ICD-10-CM | POA: Diagnosis not present

## 2016-06-27 DIAGNOSIS — R531 Weakness: Secondary | ICD-10-CM | POA: Diagnosis not present

## 2016-06-27 DIAGNOSIS — R278 Other lack of coordination: Secondary | ICD-10-CM | POA: Diagnosis not present

## 2016-06-27 DIAGNOSIS — I5040 Unspecified combined systolic (congestive) and diastolic (congestive) heart failure: Secondary | ICD-10-CM | POA: Diagnosis not present

## 2016-06-27 DIAGNOSIS — I2510 Atherosclerotic heart disease of native coronary artery without angina pectoris: Secondary | ICD-10-CM | POA: Diagnosis not present

## 2016-06-27 DIAGNOSIS — E876 Hypokalemia: Secondary | ICD-10-CM | POA: Diagnosis not present

## 2016-06-27 DIAGNOSIS — I350 Nonrheumatic aortic (valve) stenosis: Secondary | ICD-10-CM | POA: Diagnosis not present

## 2016-06-27 DIAGNOSIS — E871 Hypo-osmolality and hyponatremia: Secondary | ICD-10-CM | POA: Diagnosis not present

## 2016-06-27 DIAGNOSIS — R2689 Other abnormalities of gait and mobility: Secondary | ICD-10-CM | POA: Diagnosis not present

## 2016-06-27 DIAGNOSIS — B372 Candidiasis of skin and nail: Secondary | ICD-10-CM | POA: Diagnosis not present

## 2016-06-27 DIAGNOSIS — K219 Gastro-esophageal reflux disease without esophagitis: Secondary | ICD-10-CM | POA: Diagnosis not present

## 2016-06-27 DIAGNOSIS — D500 Iron deficiency anemia secondary to blood loss (chronic): Secondary | ICD-10-CM | POA: Diagnosis not present

## 2016-06-27 LAB — CBC WITH DIFFERENTIAL/PLATELET
BASOS ABS: 0.1 10*3/uL (ref 0.0–0.1)
BASOS PCT: 1 %
EOS PCT: 7 %
Eosinophils Absolute: 0.5 10*3/uL (ref 0.0–0.7)
HCT: 30.6 % — ABNORMAL LOW (ref 39.0–52.0)
Hemoglobin: 10.2 g/dL — ABNORMAL LOW (ref 13.0–17.0)
Lymphocytes Relative: 20 %
Lymphs Abs: 1.6 10*3/uL (ref 0.7–4.0)
MCH: 31.3 pg (ref 26.0–34.0)
MCHC: 33.3 g/dL (ref 30.0–36.0)
MCV: 93.9 fL (ref 78.0–100.0)
MONO ABS: 0.6 10*3/uL (ref 0.1–1.0)
Monocytes Relative: 7 %
NEUTROS ABS: 5.2 10*3/uL (ref 1.7–7.7)
Neutrophils Relative %: 65 %
PLATELETS: 201 10*3/uL (ref 150–400)
RBC: 3.26 MIL/uL — AB (ref 4.22–5.81)
RDW: 13 % (ref 11.5–15.5)
WBC: 8 10*3/uL (ref 4.0–10.5)

## 2016-06-27 NOTE — Progress Notes (Signed)
Pt to be discharged to [facility]. IV and telemetry box removed. Report has been given to receiving nurse at [facility]. Family has been updated about pt's plan of care.   Grant Fontana BSN, RN

## 2016-06-27 NOTE — Care Management Note (Addendum)
Case Management Note Marvetta Gibbons RN, BSN Unit 2W-Case Manager [phone]  Patient Details  Name: Richard Simon MRN: [id number] Date of Birth: 02/11/1930  Subjective/Objective:  Pt s/p TAVR   tx from 2S to 2W on 3/10               Action/Plan: PTA pt lived at home with wife, PT recommendations for SNF, CSW following for placement needs- plan for [facility].  Expected Discharge Date:  06/26/16               Expected Discharge Plan:  [place]  In-House Referral:  NA  Discharge planning Services  CM Consult  Post Acute Care Choice:  NA Choice offered to:  NA  DME Arranged:    DME Agency:     HH Arranged:  NA HH Agency:     Status of Service:  Completed, signed off  If discussed at [place] of Stay Meetings, dates discussed:  3/13  Additional Comments:  Dawayne Patricia, RN 06/27/2016, 11:49 AM

## 2016-06-27 NOTE — Progress Notes (Signed)
      [place] [address]       [place] [address]             [phone]      5 Days Post-Op Procedure(s) (LRB): TRANSCATHETER AORTIC VALVE REPLACEMENT, TRANSFEMORAL (N/A) TRANSESOPHAGEAL ECHOCARDIOGRAM (TEE) (N/A) Subjective: Feels well except a persistent headache  Objective: Vital signs in last 24 hours: Temp:  [98 F (36.7 C)-98.4 F (36.9 C)] 98.3 F (36.8 C) (03/13 0354) Pulse Rate:  [90-98] 90 (03/13 0354) Cardiac Rhythm: Atrial fibrillation (03/12 1930) Resp:  [17-18] 18 (03/13 0354) BP: (110-127)/(69-80) 121/80 (03/13 0354) SpO2:  [97 %-98 %] 97 % (03/13 0354) Weight:  [164 lb 9.6 oz (74.7 kg)] 164 lb 9.6 oz (74.7 kg) (03/13 0354)  Hemodynamic parameters for last 24 hours:    Intake/Output from previous day: 03/12 0701 - 03/13 0700 In: 600 [P.O.:600] Out: 250 [Urine:250] Intake/Output this shift: No intake/output data recorded.  General appearance: alert, cooperative and no distress Heart: irregularly irregular rhythm Lungs: clear to auscultation bilaterally Abdomen: benign Extremities: no edema Wound: groins without hematoma or echymosis  Lab Results:  Recent Labs  06/26/16 0220 06/27/16 0212  WBC 7.5 8.0  HGB 9.9* 10.2*  HCT 28.7* 30.6*  PLT 199 201   BMET:  Recent Labs  06/25/16 0412  NA 133*  K 3.5  CL 99*  CO2 28  GLUCOSE 116*  BUN 7  CREATININE 0.66  CALCIUM 8.6*    PT/INR: No results for input(s): LABPROT, INR in the last 72 hours. ABG    Component Value Date/Time   PHART 7.422 06/22/2016 1652   HCO3 26.9 06/22/2016 1652   TCO2 28 06/22/2016 1652   O2SAT 98.0 06/22/2016 1652   CBG (last 3)  No results for input(s): GLUCAP in the last 72 hours.  Meds Scheduled Meds: . aspirin EC  81 mg Oral Daily  . bacitracin  1 application Topical TID  . chlorhexidine  15 mL Mouth Rinse BID  . dabigatran  150 mg Oral Q12H  . diltiazem  240 mg Oral Daily  . docusate sodium  100 mg Oral BID  . feeding supplement (ENSURE  ENLIVE)  237 mL Oral TID BM  . furosemide  40 mg Oral Daily  . Gerhardt's butt cream   Topical BID  . hydrocortisone cream   Topical TID  . mouth rinse  15 mL Mouth Rinse BID  . mouth rinse  15 mL Mouth Rinse q12n4p  . metoprolol tartrate  75 mg Oral BID  . pantoprazole  40 mg Oral Daily  . potassium chloride  20 mEq Oral Daily  . rosuvastatin  10 mg Oral Daily  . sodium chloride flush  3 mL Intravenous Q12H  . sodium chloride flush  3 mL Intravenous Q12H   Continuous Infusions: PRN Meds:.sodium chloride, sodium chloride, acetaminophen, ALPRAZolam, guaiFENesin-dextromethorphan, levalbuterol, magnesium hydroxide, ondansetron **OR** ondansetron (ZOFRAN) IV, polyethylene glycol, sodium chloride flush, sodium chloride flush, traMADol, witch hazel-glycerin  Xrays No results found.  Assessment/Plan: S/P Procedure(s) (LRB): TRANSCATHETER AORTIC VALVE REPLACEMENT, TRANSFEMORAL (N/A) TRANSESOPHAGEAL ECHOCARDIOGRAM (TEE) (N/A)  1 remains clinically stable 2 SNF when arrangements finalized   LOS: 20 days    Richard Simon E 06/27/2016

## 2016-06-27 NOTE — Progress Notes (Addendum)
Clinical Social Worker facilitated patient discharge including contacting patient family and facility to confirm patient discharge plans.  Clinical information faxed to facility and family agreeable with plan.  CSW spoke to family and son [name] will be transporting patient to facility via private vehicle. Son Quillian Quince has agreed to sign paper work on patients behalf .  RN Lauren to call [phone] for report prior to discharge.  Clinical Social Worker will sign off for now as social work intervention is no longer needed. Please consult [place] again if new need arises.  Rhea Pink, MSW, [place]

## 2016-06-27 NOTE — Progress Notes (Signed)
Pt discharged to [facility]. Pt left with all of his belongings. Pt's Son, Linna Hoff, is transporting pt to facility. Linna Hoff has been informed to take pt directly to the facility. Dan verbalized understanding.   Grant Fontana BSN, RN

## 2016-06-27 NOTE — Progress Notes (Signed)
Unable to discharge yesterday due to bed availability. He is feeling better today. HA improved after BM and walking. Patient states that "no need for neurology evaluation". His symptoms likely due to lack of sleep and loss of appetitive. Feeling good today. Headache almost resolved. Eager to go to SNF.

## 2016-06-28 ENCOUNTER — Encounter: Payer: Self-pay | Admitting: Internal Medicine

## 2016-06-28 ENCOUNTER — Non-Acute Institutional Stay (SKILLED_NURSING_FACILITY): Payer: Medicare HMO | Admitting: Internal Medicine

## 2016-06-28 DIAGNOSIS — K219 Gastro-esophageal reflux disease without esophagitis: Secondary | ICD-10-CM

## 2016-06-28 DIAGNOSIS — I4821 Permanent atrial fibrillation: Secondary | ICD-10-CM

## 2016-06-28 DIAGNOSIS — I350 Nonrheumatic aortic (valve) stenosis: Secondary | ICD-10-CM

## 2016-06-28 DIAGNOSIS — R531 Weakness: Secondary | ICD-10-CM | POA: Diagnosis not present

## 2016-06-28 DIAGNOSIS — I482 Chronic atrial fibrillation: Secondary | ICD-10-CM | POA: Diagnosis not present

## 2016-06-28 DIAGNOSIS — E871 Hypo-osmolality and hyponatremia: Secondary | ICD-10-CM | POA: Diagnosis not present

## 2016-06-28 DIAGNOSIS — D500 Iron deficiency anemia secondary to blood loss (chronic): Secondary | ICD-10-CM | POA: Diagnosis not present

## 2016-06-28 DIAGNOSIS — I5042 Chronic combined systolic (congestive) and diastolic (congestive) heart failure: Secondary | ICD-10-CM

## 2016-06-28 DIAGNOSIS — E876 Hypokalemia: Secondary | ICD-10-CM | POA: Diagnosis not present

## 2016-06-28 DIAGNOSIS — I2510 Atherosclerotic heart disease of native coronary artery without angina pectoris: Secondary | ICD-10-CM | POA: Diagnosis not present

## 2016-06-28 NOTE — Progress Notes (Signed)
LOCATION: [facility]  PCP: Merrilee Seashore, MD   Code Status: Full Code  Goals of care: Advanced Directive information Advanced Directives 06/07/2016  Does Patient Have a Medical Advance Directive? Yes  Type of Advance Directive Living will  Does patient want to make changes to medical advance directive? No - Patient declined       Extended Emergency Contact Information Primary Emergency Contact: Rodolph Bong, [place] Mobile Phone: [phone] Relation: None Secondary Emergency Contact: Harder,[name] Address: [place] [address], [place] [address] [place] Phone: [phone] Relation: Son   Allergies  Allergen Reactions  . No Known Allergies     Chief Complaint  Patient presents with  . New Admit To SNF    New Admission Visit     HPI:  Patient is a 83 y.o. male seen today for short term rehabilitation post hospital admission from 06/07/16-06/27/16 with generalized weakness, dyspnea and chest pain. He was diagnosed with CAP and hyponatremia. He ws started on antibiotics and received IV fluids. He underwent cardiac catheterization showing severe native vessel CAD with patency of his bypass grafts and TVAR on 06/22/16 for severe aortic stenosis. Post surgery, he went into afib and required AV nodal blocking agent. He was also treated for acute on chronic CHF and responded well to diuresis. He has medical history of Afib on pradaxa, CAD s/p CABG, HTN, HLD and Aortic stenosis among others. He is seen in his room today.   Review of Systems:  Constitutional: Negative for fever, chills, diaphoresis. he feels tired this morning.  HENT: Negative for headache, congestion, nasal discharge, sore throat, difficulty swallowing.   Eyes: Negative for blurred vision, double vision and discharge.  Respiratory: Positive for cough and dyspnea on exertion. Denies dyspnea at rest. Negative for wheezing.   Cardiovascular:  Negative for chest pain, palpitations, leg swelling.  Gastrointestinal: Negative for heartburn, vomiting, abdominal pain. Appetite has been picking up. He has been constipated. Positive for occasional nausea. He had a small bowel movement this am.  Genitourinary: Negative for dysuria and flank pain.  Musculoskeletal: Negative for back pain, fall.  Skin: Negative for itching, rash.  Neurological: Positive for dizziness with change in position. Psychiatric/Behavioral: Negative for depression.   Past Medical History:  Diagnosis Date  . Atrial fibrillation, permanent ([place])    Rate controlled. Anticoagulation with Pradaxa.  Marland Kitchen CAD (coronary artery disease) 2004   S/P 4-vessel CABG in 2004  . Dyslipidemia, goal LDL below 70   . Hypertension     very well controlled  . Moderate to severe aortic stenosis 01/2013; 06/2014   Echo Eval: a) 01/2013: Mod calcified AoV w/ reduced mobility of R cusp. Mod-Severe stenosis. Mn-Pk gradient 25 mmHg--39 mmHg. AVA ~ 0.79-0.83 cm. Moderate MR. Normal LV size and function EF 55-60%. Elevated LAP. Mild pulmonary attention. Mild moderately dilated left atrium.;; b) 06/2014: EF 60-65%. Mod-Severe AS, AVA~0.69, Mn-Pk gradient 32 mmHg -- 55 mmHg, severe LA dilation, moderate-severe RA d  . S/P CABG x 4 2004   Post CABG   Past Surgical History:  Procedure Laterality Date  . CARDIAC CATHETERIZATION  12/11/2002   Recommendation-CABG; 80% mid LAD, 70% distal LAD. Mid circumflex 95%, OM1 95%. AV groove circumflex 95%. Proximal RCA 100%  . CORONARY ARTERY BYPASS GRAFT  12/15/2002   LIMA-LAD, SVG-diagonal, SVG-OM1-OM2 sequential  Leane Call  December 2012  Fixed basal inferior diaphragmatic attenuation versusinfarct, no ischemia  . MULTIPLE EXTRACTIONS WITH ALVEOLOPLASTY N/A 06/20/2016   Procedure: EXTRACTION of #7, 12, 13, and 15 WITH ALVEOLOPLASTY and gross debridement of teeth;  Surgeon: Lenn Cal, DDS;  Location: [facility];  Service: Oral Surgery;   Laterality: N/A;  . RIGHT HEART CATH AND CORONARY/GRAFT ANGIOGRAPHY N/A 06/13/2016   Procedure: Right Heart Cath and Coronary/Graft Angiography;  Surgeon: Leonie Man, MD;  Location: [facility];  Service: Cardiovascular;  Laterality: N/A;  . TEE WITHOUT CARDIOVERSION N/A 06/22/2016   Procedure: TRANSESOPHAGEAL ECHOCARDIOGRAM (TEE);  Surgeon: Sherren Mocha, MD;  Location: [facility];  Service: Open Heart Surgery;  Laterality: N/A;  . TRANSCATHETER AORTIC VALVE REPLACEMENT, TRANSFEMORAL N/A 06/22/2016   Procedure: TRANSCATHETER AORTIC VALVE REPLACEMENT, TRANSFEMORAL;  Surgeon: Sherren Mocha, MD;  Location: [facility];  Service: Open Heart Surgery;  Laterality: N/A;  . TRANSTHORACIC ECHOCARDIOGRAM  September 2013   Normal EF. > 55%; moderate aortic valve stenosis with mild AI. Gradient: Peak 40 mmHg, mean 24 military. Moderate LA dilation. Moderate TR with RV pressure 30-40 mmHg. Mild MR.  . TRANSTHORACIC ECHOCARDIOGRAM  October 2014   Moderate-severe AS with mild AI. Peak and mean gradients no significant change: 39 mmHg and 25 mmHg;; mild LV dilation with EF 55-60%. Elevated LAP. Moderate MR. Mild to moderate LA dilation. Mildly elevated RV pressure.  . TRANSTHORACIC ECHOCARDIOGRAM  March 2016   EF 60-65%. Mod-Severe AS, AVA~0.69, Mn-Pk gradient 32 mmHg -- 55 mmHg, severe LA dilation, moderate-severe RA dilation: Progression of disease   Social History:   reports that he quit smoking about 41 years ago. He has never used smokeless tobacco. He reports that he drinks about 8.4 oz of alcohol per week . His drug history is not on file.  Family History  Problem Relation Age of Onset  . Hip fracture Mother     Medications: Allergies as of 06/28/2016      Reactions   No Known Allergies       Medication List       Accurate as of 06/28/16 11:54 AM. Always use your most recent med list.          Acetaminophen 500 MG coapsule Take 1 tablet by mouth every 6 (six) hours as needed for pain.     aspirin EC 81 MG tablet Take 81 mg by mouth daily.   bacitracin ointment Apply 1 application topically 3 (three) times daily.   CARTIA XT 240 MG 24 hr capsule Generic drug:  diltiazem TAKE 1 CAPSULE EVERY DAY   co-enzyme Q-10 30 MG capsule Take 1 capsule by mouth daily.   dabigatran 150 MG Caps capsule Commonly known as:  PRADAXA Take 1 capsule (150 mg total) by mouth 2 (two) times daily.   docusate sodium 100 MG capsule Commonly known as:  COLACE Take 1 capsule (100 mg total) by mouth 2 (two) times daily.   Fish Oil 1200 MG Caps Take 1,200 mg by mouth 2 (two) times daily.   furosemide 40 MG tablet Commonly known as:  LASIX Take 1 tablet (40 mg total) by mouth daily.   Metoprolol Tartrate 75 MG Tabs Take 75 mg by mouth 2 (two) times daily.   pantoprazole 40 MG tablet Commonly known as:  PROTONIX Take 1 tablet (40 mg total) by mouth daily.   potassium chloride SA 20 MEQ tablet Commonly known as:  K-DUR,KLOR-CON Take 1 tablet (20 mEq total) by mouth daily.   rosuvastatin 10 MG tablet  Commonly known as:  CRESTOR TAKE 1 TABLET (10 MG TOTAL) BY MOUTH DAILY.       Immunizations: Immunization History  Administered Date(s) Administered  . Influenza,inj,Quad PF,36+ Mos 06/09/2016  . Pneumococcal Polysaccharide-23 06/09/2016     Physical Exam: Vitals:   06/28/16 1151  BP: 132/73  Pulse: 83  Resp: 18  Temp: 98.3 F (36.8 C)  TempSrc: Oral  SpO2: 97%  Weight: 164 lb 9.6 oz (74.7 kg)  Height: 5\' 9"  (1.753 m)   Body mass index is 24.31 kg/m.  General- elderly male, well built, in no acute distress Head- normocephalic, atraumatic Nose- no nasal discharge Throat- moist mucus membrane, normal oropharynx Eyes- PERRLA, EOMI, no pallor, no icterus, no discharge, normal conjunctiva, normal sclera Neck- no cervical lymphadenopathy Cardiovascular- irregular heart rate, systolic murmur + Respiratory- bilateral clear to auscultation, no wheeze, no rhonchi, no  crackles, no use of accessory muscles Abdomen- bowel sounds present, soft, non tender, no guarding or rigidity Musculoskeletal- able to move all 4 extremities, generalized weakness Neurological- alert and oriented to person, place and time Skin- warm and dry Psychiatry- normal mood and affect    Labs reviewed: Basic Metabolic Panel:  Recent Labs  06/22/16 0533  06/22/16 1500 06/22/16 1617 06/23/16 0415 06/25/16 0412  NA 134*  < > 135 136 134* 133*  K 4.1  < > 3.6 3.7 3.8 3.5  CL 97*  < > 97*  --  103 99*  CO2 28  --   --   --  26 28  GLUCOSE 106*  < > 130* 124* 101* 116*  BUN 9  < > 6  --  6 7  CREATININE 0.81  < > 0.60*  --  0.69 0.66  CALCIUM 9.1  --   --   --  8.4* 8.6*  MG  --   --   --   --  1.6*  --   < > = values in this interval not displayed. Liver Function Tests: No results for input(s): AST, ALT, ALKPHOS, BILITOT, PROT, ALBUMIN in the last 8760 hours. No results for input(s): LIPASE, AMYLASE in the last 8760 hours. No results for input(s): AMMONIA in the last 8760 hours. CBC:  Recent Labs  06/25/16 0412 06/26/16 0220 06/27/16 0212  WBC 8.1 7.5 8.0  NEUTROABS 5.7 4.8 5.2  HGB 9.8* 9.9* 10.2*  HCT 29.3* 28.7* 30.6*  MCV 94.2 94.4 93.9  PLT 193 199 201   Cardiac Enzymes:  Recent Labs  06/07/16 0723 06/07/16 1357  TROPONINI <0.03 <0.03   BNP: Invalid input(s): POCBNP CBG:  Recent Labs  06/20/16 1253  GLUCAP 100*    Radiological Exams: Dg Orthopantogram  Result Date: 06/16/2016 CLINICAL DATA:  Aortic stenosis. EXAM: ORTHOPANTOGRAM/PANORAMIC COMPARISON:  CT 01/03/2009. FINDINGS: Mild periapical lucency lucency is noted over the left lower most posterior remaining tooth. Subtle lucencies noted over the posterior most aspect of the left inferior mandibular ramus. Dental evaluation suggested. No other focal bony abnormalities identified. No evidence of fracture. IMPRESSION: Mild periapical lucency noted over the left lower most posterior remaining  tooth. Subtle lucencies noted over the posterior aspect of the left inferior mandibular ramus. Dental evaluation suggested. Electronically Signed   By: Marcello Moores  Register   On: 06/16/2016 15:20   Dg Chest 2 View  Result Date: 06/07/2016 CLINICAL DATA:  Chest pain with shortness of breath EXAM: CHEST  2 VIEW COMPARISON:  12/27/2010 FINDINGS: Post sternotomy changes. Mild hyperinflation. Diffuse coarsening of the lung interstitium, suspect chronic change.  Hazy opacity within the bilateral upper lobes suspicious for superimposed infection or inflammatory process. Tiny bilateral effusions. Mild cardiomegaly. No pneumothorax. Degenerative changes of the spine. IMPRESSION: 1. Diffuse coarsening of the lung interstitium, suspect chronic interstitial changes. Hazy opacities are present within the bilateral upper lobes and are suspicious for superimposed infectious or inflammatory process. 2. Mild cardiomegaly Electronically Signed   By: Donavan Foil M.D.   On: 06/07/2016 01:16   Ct Cardiac Morph/pulm Vein W/cm&w/o Ca Score  Addendum Date: 06/16/2016   ADDENDUM REPORT: 06/16/2016 09:50 EXAM: OVER-READ INTERPRETATION  CT CHEST The following report is an over-read performed by radiologist Dr. Rebekah Chesterfield [facility] on 06/16/2016. This over-read does not include interpretation of cardiac or coronary anatomy or pathology. The cardiac CT interpretation by the cardiologist is attached. COMPARISON:  None. FINDINGS: Full description of extracardiac findings on separate dictation for contemporaneously obtained CTA of the chest, abdomen and pelvis 06/15/2016. IMPRESSION: See separate dictation for contemporaneously obtained CTA of the chest, abdomen and pelvis 06/15/2016 for full description of extracardiac findings. Electronically Signed   By: Vinnie Langton M.D.   On: 06/16/2016 09:50   Result Date: 06/16/2016 CLINICAL DATA:  83 year old male with severe aortic stenosis. EXAM: Cardiac TAVR CT TECHNIQUE:  The patient was scanned on a Philips 256 scanner. A 120 kV retrospective scan was triggered in the descending thoracic aorta at 111 HU's. Gantry rotation speed was 270 msecs and collimation was .9 mm. 20 mg of iv Metoprolol and no nitro were given. The 3D data set was reconstructed in 5% intervals of the R-R cycle. Systolic and diastolic phases were analyzed on a dedicated work station using MPR, MIP and VRT modes. The patient received 80 cc of contrast. FINDINGS: Aortic Valve: Trileaflet, severely calcified and thickened with severely restricted leaflet opening and minimal calcifications extending into the LVOT. Aorta:  Normal size, no dissection.  Mild diffuse calcifications. Sinotubular Junction:  30 x 30 mm Ascending Thoracic Aorta:  33 x 32 mm Aortic Arch:  24 x 24 mm Descending Thoracic Aorta:  24 x 24 mm Sinus of Valsalva Measurements: Non-coronary:  37 mm Right -coronary:  33 mm Left -coronary:  34 mm Coronary Artery Height above Annulus: Left Main:  16 mm Right Coronary:  24 mm Virtual Basal Annulus Measurements: Maximum/Minimum Diameter:  32 x 22 mm Perimeter:  94 mm Area:  596 mm2 Optimum Fluoroscopic Angle for Delivery:  RAO 2 CRA 1 Dilated pulmonary artery measuring 32 x 29 mm No thrombus in the left atrial appendage. Normal pulmonary vein drainage into the left atrium. No ASD/VSD seen. IMPRESSION: 1. Trileaflet, severely calcified and thickened with severely restricted leaflet opening and minimal calcifications extending into the LVOT. Annular measurements suitable for delivery of a 29 mm Edwards-SAPIEN 3 TAVR valve. 2. Optimum Fluoroscopic Angle for Delivery:  RAO 2 CRA 1 Ena Dawley Electronically Signed: By: Ena Dawley On: 06/15/2016 18:06   Dg Chest Port 1 View  Result Date: 06/23/2016 CLINICAL DATA:  Severe aortic stenosis status post TAVR EXAM: PORTABLE CHEST 1 VIEW COMPARISON:  Chest radiograph from one day prior. FINDINGS: Stable configuration of right internal jugular central  venous catheter, median sternotomy wires and aortic valve prosthesis. Stable cardiomediastinal silhouette with mild cardiomegaly and aortic atherosclerosis. No pneumothorax. Small bilateral pleural effusions appear slightly increased bilaterally. Mild-to-moderate pulmonary edema appears stable. IMPRESSION: 1. No pneumothorax . 2. Small bilateral pleural effusions, slightly increased bilaterally. 3. Mild-to-moderate congestive heart failure, stable . 4. Aortic atherosclerosis. Electronically Signed  By: Ilona Sorrel M.D.   On: 06/23/2016 09:08   Dg Chest Port 1 View  Result Date: 06/22/2016 CLINICAL DATA:  Severe aortic stenosis. EXAM: PORTABLE CHEST 1 VIEW COMPARISON:  06/19/2016 FINDINGS: Sequelae of prior CABG and interval transcatheter aortic valve replacement are identified. A right jugular catheter terminates over the SVC. The cardiomediastinal silhouette is unchanged. Diffuse interstitial coarsening with bilateral upper lobe predominant airspace opacities do not appear significantly changed. No sizable pleural effusion or pneumothorax is identified. IMPRESSION: 1. Interval TAVR.  Right jugular catheter overlies SVC. 2. Unchanged bilateral upper lobe predominant airspace disease. Electronically Signed   By: Logan Bores M.D.   On: 06/22/2016 16:29   Dg Chest Port 1 View  Result Date: 06/19/2016 CLINICAL DATA:  Shortness of breath and congestion EXAM: PORTABLE CHEST 1 VIEW COMPARISON:  06/09/2016, 06/15/2016 FINDINGS: Cardiac shadow is mildly enlarged but stable. Postsurgical changes are again seen. Diffuse changes in the upper lobes are noted bilaterally similar to that seen on recent chest x-ray and recent CT examination. No bony abnormality is noted. IMPRESSION: Stable changes in the upper lobes bilaterally. No new focal abnormality is seen. Electronically Signed   By: Inez Catalina M.D.   On: 06/19/2016 14:07   Dg Chest Port 1 View  Result Date: 06/09/2016 CLINICAL DATA:  Shortness of breath. EXAM:  PORTABLE CHEST 1 VIEW COMPARISON:  06/09/2016 at 7:35 a.m. FINDINGS: Sternotomy wires unchanged. Lungs are adequately inflated with continued airspace consolidation most prominent over the upper lobes right worse than left which is slightly worse over the right mid to upper lung. Minimal patchy bibasilar opacification unchanged. Possible small amount of bilateral pleural fluid unchanged. Mild stable cardiomegaly. Calcified plaque over the aortic arch. Remainder of the exam is unchanged. IMPRESSION: Multifocal airspace process worse over the upper lobes with possible slight interval worsening over the right upper lobe. Findings are likely due to infection. Possible small amount of bilateral pleural fluid. Mild stable cardiomegaly.  Aortic atherosclerosis. Electronically Signed   By: Marin Olp M.D.   On: 06/09/2016 21:05   Dg Chest Port 1 View  Result Date: 06/09/2016 CLINICAL DATA:  Shortness of Breath EXAM: PORTABLE CHEST 1 VIEW COMPARISON:  June 07, 2016 and December 27, 2010 FINDINGS: There is diffuse interstitial prominence, primarily in the upper lobes superimposed on chronic interstitial thickening. There is patchy airspace opacity in the upper lobes and left mid lung region superimposed on interstitial edema. There is cardiomegaly with pulmonary venous hypertension. There are small pleural effusions bilaterally. There is atherosclerotic calcification in the aorta. Patient is status post coronary artery bypass grafting. No bone lesions. No adenopathy. IMPRESSION: Findings felt to represent congestive heart failure superimposed on chronic interstitial thickening. Relative airspace opacity in portions of the upper lobes likely represents alveolar edema. A degree of superimposed pneumonia in the upper lobes cannot be excluded radiographically. There is aortic atherosclerosis. Appearance is essentially stable compared to 1 day prior. Electronically Signed   By: Lowella Grip III M.D.   On:  06/09/2016 08:04   Ct Angio Chest/abd/pel For Dissection W And/or W/wo  Result Date: 06/16/2016 CLINICAL DATA:  83 year old male with history of severe aortic stenosis. Preprocedural study prior to potential transcatheter aortic valve replacement (TAVR). EXAM: CT ANGIOGRAPHY CHEST, ABDOMEN AND PELVIS TECHNIQUE: Multidetector CT imaging through the chest, abdomen and pelvis was performed using the standard protocol during bolus administration of intravenous contrast. Multiplanar reconstructed images and MIPs were obtained and reviewed to evaluate the vascular anatomy. CONTRAST:  80  mL of Isovue 370. COMPARISON:  No priors. FINDINGS: CTA CHEST FINDINGS Cardiovascular: Heart size is mildly enlarged with biatrial dilatation. There is no significant pericardial fluid, thickening or pericardial calcification. There is aortic atherosclerosis, as well as atherosclerosis of the great vessels of the mediastinum and the coronary arteries, including calcified atherosclerotic plaque in the left main, left anterior descending and left circumflex coronary arteries. Status post median sternotomy for CABG, including LIMA to the LAD. Mediastinum/Lymph Nodes: Multiple prominent borderline enlarged mediastinal and hilar lymph nodes are noted, likely reactive. Esophagus is unremarkable in appearance. No axillary lymphadenopathy. Lungs/Pleura: Moderate right and small left pleural effusions lie dependently. Throughout the lungs bilaterally there are relatively symmetric areas of ground-glass attenuation and septal thickening which are most evident throughout the mid to upper lungs. Lower lungs appear relatively normal, with a few patchy areas of ground-glass attenuation. Musculoskeletal/Soft Tissues: Median sternotomy wires. There are no aggressive appearing lytic or blastic lesions noted in the visualized portions of the skeleton. CTA ABDOMEN AND PELVIS FINDINGS Hepatobiliary: Liver has a slightly shrunken appearance and nodular  contour, suggestive of underlying cirrhosis. No cystic or solid hepatic lesions. No intra or extrahepatic biliary ductal dilatation. Gallbladder is normal in appearance. Pancreas: No pancreatic mass. No pancreatic ductal dilatation. No pancreatic or peripancreatic fluid or inflammatory changes. Spleen: Unremarkable. Adrenals/Urinary Tract: Bilateral kidneys and bilateral adrenal glands are unremarkable in appearance. No hydroureteronephrosis. Small filling defect in the urinary bladder measuring 0.6 x 1.3 x 1.6 cm (axial image 254 of series 401 and coronal image 108 of series 403), with potential stalk like attachment to the wall of the urinary bladder (coronal image 108 of series 403). There is also some plaque-like irregularity along the posterior aspect of the urinary bladder wall, best appreciated on sagittal image 124 of series 404. Stomach/Bowel: The appearance of the stomach is normal. There is no pathologic dilatation of small bowel or colon. Vascular/Lymphatic: Aortic atherosclerosis, with vascular findings and measurements pertinent to potential TAVR procedure, as detailed below. 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 Throughout the abdominal aorta there multiple ulcerated plaques. The largest of these plaques is best demonstrated in cross-section on axial image 200 of series 401 adjacent to the inferior mesenteric artery origin, where it appears as a short segment dissection (however, this does not appear to be a true dissection). 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 The celiac axis, superior mesenteric artery and inferior mesenteric artery are all patent without definite hemodynamically significant stenosis. Two right-sided and one left-sided renal arteries are all  patent without hemodynamically significant stenosis. No lymphadenopathy noted in the abdomen or pelvis. Reproductive: Prostate gland and seminal vesicles are unremarkable in appearance. Other: No significant volume of ascites.  No pneumoperitoneum. Musculoskeletal: There are no aggressive appearing lytic or blastic lesions noted in the visualized portions of the skeleton. VASCULAR MEASUREMENTS PERTINENT TO TAVR: AORTA: Minimal Aortic Diameter -  19 x 15 mm Severity of Aortic Calcification -  moderate to severe RIGHT PELVIS: Right Common Iliac Artery - Minimal Diameter - 11.2 x 8.3 mm Tortuosity - severe Calcification - moderate Right External Iliac Artery - Minimal Diameter - 8.7 x 9.1 mm Tortuosity - severe Calcification - mild Right Common Femoral Artery - Focal aneurysmal dilatation up to 17 mm with  large ulcerated plaque. Minimal Diameter - 8.5 x 6.3 mm Tortuosity - mild Calcification - moderate LEFT PELVIS: Left Common Iliac Artery - Minimal Diameter - 8.1 x 7.8 mm Tortuosity - severe Calcification - moderate Left External Iliac Artery - Minimal Diameter - 9.1 x 8.5 mm Tortuosity - severe Calcification - mild Left Common Femoral Artery - Minimal Diameter - 8.7 x 7.6 mm Tortuosity - mild Calcification - moderate Review of the MIP images confirms the above findings. IMPRESSION: 1. Vascular findings and measurements pertinent to potential TAVR procedure, as detailed above. From a size criteria, the patient has suitable pelvic arterial access bilaterally. 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 In addition, there is a large ulcerated plaque in the infrarenal abdominal aorta (adjacent to the origin of the inferior mesenteric artery), best appreciated on axial image 200 of series 401, which in the axial plane  has an appearance of a very short segment dissection. Caution should be taken when traversing this region with a catheter. 2. Severe thickening calcification of the aortic valve, compatible with the reported clinical history of severe aortic stenosis. 3. Extensive ground-glass attenuation and septal thickening throughout the mid to upper lungs bilaterally, strongly favored to be related to an acute infection. There may also be a background of very mild interstitial pulmonary edema, best appreciated in the lower lungs. 4. Moderate right and small left pleural effusions lying dependently. 5. Plaque-like irregularity along the posterior aspect of the urinary bladder wall, adjacent to a small filling defect which may have an attachment to the wall of the urinary bladder. These findings are concerning for potential urothelial neoplasm, and follow-up nonemergent evaluation by Urology is suggested in the near future to better evaluate these findings. 6. Morphologic changes in the liver suggestive of early cirrhosis, as above. 7. Additional incidental findings, as above. Electronically Signed   By: Vinnie Langton M.D.   On: 06/16/2016 09:01    Assessment/Plan  Generalized weakness Will have him work with physical therapy and occupational therapy team to help with gait training and muscle strengthening exercises.fall precautions. Skin care. Encourage to be out of bed.   Community-acquired pneumonia Has completed his course of antibiotic. Breathing has been stable at present. Patient remains afebrile. Encourage patient to use incentive spirometer meter to prevent atelectasis. Monitor his breathing  Aortic stenosis Status post TVAR. Will need follow-up with cardiothoracic surgery.Will have patient work with PT/OT as tolerated to regain strength and restore function.  Fall precautions are in place. She reviewed echocardiogram 06/23/16 with LVEF 40-45 percent with normal functioning bioprosthetic stent  valve.  Coronary Artery disease Chest pain free. Continue metoprolol tartrate 75 mg bid, rosuvastatin 10 mg daily and aspirin enteric-coated 81 mg daily.  Constipation D/c colace. Start senna s 2 tab qhs and miralax daily as needed and monitor  Hyponatremia Check BMP.  Blood loss anemia Check CBC. No bleed reported at present.  afib Controlled heart rate. Continue metoprolol tartrate 75 mg twice a day, diltiazem 240 mg daily and Pradaxa 150 mg twice a day for anticoagulation.  Chronic systolic and diastolic congestive heart failure Reviewed echocardiogram. Continue Lasix 40 mg daily, metoprolol tartrate 75 mg twice a day  Hypokalemia With him on Lasix. Continue potassium chloride and monitor BMP  GERD Control symptoms on Protonix 40 mg daily, no changes made  Goals of care: short term rehabilitation   Labs/tests ordered: cbc, bmp 07/03/16  Family/ staff Communication: reviewed care plan with patient and nursing supervisor    Blanchie Serve, MD Internal Medicine [place], [place] [address] Cell Phone (Monday-Friday 8 am - 5 pm): [phone] On Call: [phone] and follow prompts after 5 pm and on weekends Office Phone: [phone] Office Fax: [phone]

## 2016-07-03 LAB — CBC AND DIFFERENTIAL
HCT: 38 % — AB (ref 41–53)
HEMOGLOBIN: 12.7 g/dL — AB (ref 13.5–17.5)
NEUTROS ABS: 4 /uL
PLATELETS: 263 10*3/uL (ref 150–399)
WBC: 6.7 10*3/mL

## 2016-07-03 LAB — BASIC METABOLIC PANEL
BUN: 7 mg/dL (ref 4–21)
CREATININE: 0.8 mg/dL (ref 0.6–1.3)
Glucose: 112 mg/dL
Potassium: 4.5 mmol/L (ref 3.4–5.3)
Sodium: 136 mmol/L — AB (ref 137–147)

## 2016-07-04 ENCOUNTER — Non-Acute Institutional Stay (SKILLED_NURSING_FACILITY): Payer: Medicare HMO | Admitting: Adult Health

## 2016-07-04 ENCOUNTER — Encounter: Payer: Self-pay | Admitting: Adult Health

## 2016-07-04 DIAGNOSIS — I350 Nonrheumatic aortic (valve) stenosis: Secondary | ICD-10-CM | POA: Diagnosis not present

## 2016-07-04 DIAGNOSIS — K219 Gastro-esophageal reflux disease without esophagitis: Secondary | ICD-10-CM | POA: Diagnosis not present

## 2016-07-04 DIAGNOSIS — I482 Chronic atrial fibrillation: Secondary | ICD-10-CM | POA: Diagnosis not present

## 2016-07-04 DIAGNOSIS — J189 Pneumonia, unspecified organism: Secondary | ICD-10-CM | POA: Diagnosis not present

## 2016-07-04 DIAGNOSIS — I5042 Chronic combined systolic (congestive) and diastolic (congestive) heart failure: Secondary | ICD-10-CM | POA: Diagnosis not present

## 2016-07-04 DIAGNOSIS — E785 Hyperlipidemia, unspecified: Secondary | ICD-10-CM

## 2016-07-04 DIAGNOSIS — I4821 Permanent atrial fibrillation: Secondary | ICD-10-CM

## 2016-07-04 DIAGNOSIS — E876 Hypokalemia: Secondary | ICD-10-CM

## 2016-07-04 DIAGNOSIS — R531 Weakness: Secondary | ICD-10-CM

## 2016-07-04 DIAGNOSIS — I2510 Atherosclerotic heart disease of native coronary artery without angina pectoris: Secondary | ICD-10-CM | POA: Diagnosis not present

## 2016-07-04 DIAGNOSIS — K5901 Slow transit constipation: Secondary | ICD-10-CM | POA: Diagnosis not present

## 2016-07-04 DIAGNOSIS — B372 Candidiasis of skin and nail: Secondary | ICD-10-CM | POA: Diagnosis not present

## 2016-07-04 NOTE — Progress Notes (Signed)
DATE:  07/04/2016   MRN:  [id number]  BIRTHDAY: [birth year]  Facility:  Nursing Home Location:  [place] Room Number: 242-A  LEVEL OF CARE:  SNF [phone])  Contact Information    Name Relation Home Work [place]    [phone]   Kurk, Corniel [phone]     Sabastion, Hrdlicka [phone]         Code Status History    Date Active Date Inactive Code Status Order ID Comments User Context   06/07/2016  8:33 AM 06/27/2016  3:52 PM Full Code [id number]  Reubin Milan, MD ED   06/07/2016  2:40 AM 06/07/2016  8:32 AM Full Code [id number]  Reubin Milan, MD ED       Chief Complaint  Patient presents with  . Discharge Note    HISTORY OF PRESENT ILLNESS:  This is an 83-YO male seen for a discharge visit.  He will discharge to home on 07/04/2016 with home health PT, OT, and CNA services.    He has been admitted to [facility] on 06/27/16 from [facility] with admission dates 06/07/16 thru 06/27/16 with generalized weakness, dyspnea and chest pain. He was diagnosed with CPAP and hyponatremia. He was treated with antibiotics and IV fluids. Underwent cardiac catheterization showing severe native vessel CAD with patency of his bypass grafts and TVAR on 06/22/16 for severe aortic stenosis. He went into A. fib post-of and required AV nodal blocking agent. He was also treated for acute and chronic CHF and was diuresed. He has PMH of A. fib on Pradaxa, CAD S/P CABG, hypertension, hyperlipidemia and aortic stenosis.  Patient was admitted to this facility for short-term rehabilitation after the patient's recent hospitalization.  Patient has completed SNF rehabilitation and therapy has cleared the patient for discharge.   PAST MEDICAL HISTORY:  Past Medical History:  Diagnosis Date  . Atrial fibrillation, permanent ([place])    Rate controlled. Anticoagulation with Pradaxa.  [place] Kitchen CAD (coronary artery disease) 2004   S/P 4-vessel CABG in 2004   . Dyslipidemia, goal LDL below 70   . Hypertension     very well controlled  . Moderate to severe aortic stenosis 01/2013; 06/2014   Echo Eval: a) 01/2013: Mod calcified AoV w/ reduced mobility of R cusp. Mod-Severe stenosis. Mn-Pk gradient 25 mmHg--39 mmHg. AVA ~ 0.79-0.83 cm. Moderate MR. Normal LV size and function EF 55-60%. Elevated LAP. Mild pulmonary attention. Mild moderately dilated left atrium.;; b) 06/2014: EF 60-65%. Mod-Severe AS, AVA~0.69, Mn-Pk gradient 32 mmHg -- 55 mmHg, severe LA dilation, moderate-severe RA d  . S/P CABG x 4 2004   Post CABG     CURRENT MEDICATIONS: Reviewed  Patient's Medications  New Prescriptions   No medications on file  Previous Medications   ACETAMINOPHEN 500 MG COAPSULE    Take 1 tablet by mouth every 6 (six) hours as needed for pain.    ASPIRIN EC 81 MG TABLET    Take 81 mg by mouth daily.   CARTIA XT 240 MG 24 HR CAPSULE    TAKE 1 CAPSULE EVERY DAY   CO-ENZYME Q-10 30 MG CAPSULE    Take 1 capsule by mouth daily.   DABIGATRAN (PRADAXA) 150 MG CAPS CAPSULE    Take 1 capsule (150 mg total) by mouth 2 (two) times daily.   FUROSEMIDE (LASIX) 40 MG TABLET    Take 1 tablet (40 mg total) by mouth daily.   METOPROLOL TARTRATE  75 MG TABS    Take 75 mg by mouth 2 (two) times daily.   OMEGA-3 FATTY ACIDS (FISH OIL) 1200 MG CAPS    Take 1,200 mg by mouth 2 (two) times daily.   PANTOPRAZOLE (PROTONIX) 40 MG TABLET    Take 1 tablet (40 mg total) by mouth daily.   POLYETHYLENE GLYCOL (MIRALAX / GLYCOLAX) PACKET    Take 17 g by mouth daily as needed for mild constipation.   POTASSIUM CHLORIDE SA (K-DUR,KLOR-CON) 20 MEQ TABLET    Take 1 tablet (20 mEq total) by mouth daily.   ROSUVASTATIN (CRESTOR) 10 MG TABLET    TAKE 1 TABLET (10 MG TOTAL) BY MOUTH DAILY.   SENNOSIDES-DOCUSATE SODIUM (SENOKOT-S) 8.6-50 MG TABLET    Take 2 tablets by mouth at bedtime.   UNABLE TO FIND    Apply 1 application topically 2 (two) times daily. Med Name: Nystatin/Zinc/Tac 0.1%    Apply to scrotum, bilateral buttocks, bilateral inner thighs, bilateral groin  Modified Medications   No medications on file  Discontinued Medications   BACITRACIN OINTMENT    Apply 1 application topically 3 (three) times daily.   DOCUSATE SODIUM (COLACE) 100 MG CAPSULE    Take 1 capsule (100 mg total) by mouth 2 (two) times daily.     Allergies  Allergen Reactions  . No Known Allergies      REVIEW OF SYSTEMS:  GENERAL: no change in appetite, no fatigue, no weight changes, no fever, chills or weakness EYES: Denies change in vision, dry eyes, eye pain, itching or discharge EARS: Denies change in hearing, ringing in ears, or earache NOSE: Denies nasal congestion or epistaxis MOUTH and THROAT: Denies oral discomfort, gingival pain or bleeding, pain from teeth or hoarseness   RESPIRATORY: no cough, SOB, DOE, wheezing, hemoptysis CARDIAC: no chest pain, edema or palpitations GI: no abdominal pain, diarrhea, constipation, heart burn, nausea or vomiting GU: Denies dysuria, frequency, hematuria, incontinence, or discharge PSYCHIATRIC: Denies feeling of depression or anxiety. No report of hallucinations, insomnia, paranoia, or agitation    PHYSICAL EXAMINATION  GENERAL APPEARANCE: Well nourished. In no acute distress. Normal body habitus SKIN:  Minimal erythematous rashes on bilateral groin HEAD: Normal in size and contour. No evidence of trauma EYES: Lids open and close normally. No blepharitis, entropion or ectropion. PERRL. Conjunctivae are clear and sclerae are white. Lenses are without opacity EARS: Pinnae are normal. Patient hears normal voice tunes of the examiner MOUTH and THROAT: Lips are without lesions. Oral mucosa is moist and without lesions. Tongue is normal in shape, size, and color and without lesions NECK: supple, trachea midline, no neck masses, no thyroid tenderness, no thyromegaly LYMPHATICS: no LAN in the neck, no supraclavicular LAN RESPIRATORY: breathing is  even & unlabored, BS CTAB CARDIAC: Irregularly irregular, no murmur,no extra heart sounds, no edema GI: abdomen soft, normal BS, no masses, no tenderness, no hepatomegaly, no splenomegaly EXTREMITIES:  Able to move 4 extremities PSYCHIATRIC: Alert and oriented X 3. Affect and behavior are appropriate   LABS/RADIOLOGY: Labs reviewed: Basic Metabolic Panel:  Recent Labs  06/22/16 0533  06/22/16 1500 06/22/16 1617 06/23/16 0415 06/25/16 0412 07/03/16  NA 134*  < > 135 136 134* 133* 136*  K 4.1  < > 3.6 3.7 3.8 3.5 4.5  CL 97*  < > 97*  --  103 99*  --   CO2 28  --   --   --  26 28  --   GLUCOSE 106*  < > 130*  124* 101* 116*  --   BUN 9  < > 6  --  6 7 7   CREATININE 0.81  < > 0.60*  --  0.69 0.66 0.8  CALCIUM 9.1  --   --   --  8.4* 8.6*  --   MG  --   --   --   --  1.6*  --   --   < > = values in this interval not displayed. CBC:  Recent Labs  06/25/16 0412 06/26/16 0220 06/27/16 0212 07/03/16  WBC 8.1 7.5 8.0 6.7  NEUTROABS 5.7 4.8 5.2 4  HGB 9.8* 9.9* 10.2* 12.7*  HCT 29.3* 28.7* 30.6* 38*  MCV 94.2 94.4 93.9  --   PLT 193 199 201 263   Cardiac Enzymes:  Recent Labs  06/07/16 0723 06/07/16 1357  TROPONINI <0.03 <0.03   CBG:  Recent Labs  06/20/16 1253  GLUCAP 100*      Dg Orthopantogram  Result Date: 06/16/2016 CLINICAL DATA:  Aortic stenosis. EXAM: ORTHOPANTOGRAM/PANORAMIC COMPARISON:  CT 01/03/2009. FINDINGS: Mild periapical lucency lucency is noted over the left lower most posterior remaining tooth. Subtle lucencies noted over the posterior most aspect of the left inferior mandibular ramus. Dental evaluation suggested. No other focal bony abnormalities identified. No evidence of fracture. IMPRESSION: Mild periapical lucency noted over the left lower most posterior remaining tooth. Subtle lucencies noted over the posterior aspect of the left inferior mandibular ramus. Dental evaluation suggested. Electronically Signed   By: Marcello Moores  Register   On:  06/16/2016 15:20   Dg Chest 2 View  Result Date: 06/07/2016 CLINICAL DATA:  Chest pain with shortness of breath EXAM: CHEST  2 VIEW COMPARISON:  12/27/2010 FINDINGS: Post sternotomy changes. Mild hyperinflation. Diffuse coarsening of the lung interstitium, suspect chronic change. Hazy opacity within the bilateral upper lobes suspicious for superimposed infection or inflammatory process. Tiny bilateral effusions. Mild cardiomegaly. No pneumothorax. Degenerative changes of the spine. IMPRESSION: 1. Diffuse coarsening of the lung interstitium, suspect chronic interstitial changes. Hazy opacities are present within the bilateral upper lobes and are suspicious for superimposed infectious or inflammatory process. 2. Mild cardiomegaly Electronically Signed   By: Donavan Foil M.D.   On: 06/07/2016 01:16   Ct Cardiac Morph/pulm Vein W/cm&w/o Ca Score  Addendum Date: 06/16/2016   ADDENDUM REPORT: 06/16/2016 09:50 EXAM: OVER-READ INTERPRETATION  CT CHEST The following report is an over-read performed by radiologist Dr. Rebekah Chesterfield [facility] on 06/16/2016. This over-read does not include interpretation of cardiac or coronary anatomy or pathology. The cardiac CT interpretation by the cardiologist is attached. COMPARISON:  None. FINDINGS: Full description of extracardiac findings on separate dictation for contemporaneously obtained CTA of the chest, abdomen and pelvis 06/15/2016. IMPRESSION: See separate dictation for contemporaneously obtained CTA of the chest, abdomen and pelvis 06/15/2016 for full description of extracardiac findings. Electronically Signed   By: Vinnie Langton M.D.   On: 06/16/2016 09:50   Result Date: 06/16/2016 CLINICAL DATA:  83 year old male with severe aortic stenosis. EXAM: Cardiac TAVR CT TECHNIQUE: The patient was scanned on a Philips 256 scanner. A 120 kV retrospective scan was triggered in the descending thoracic aorta at 111 HU's. Gantry rotation speed was 270 msecs  and collimation was .9 mm. 20 mg of iv Metoprolol and no nitro were given. The 3D data set was reconstructed in 5% intervals of the R-R cycle. Systolic and diastolic phases were analyzed on a dedicated work station using MPR, MIP and VRT modes. The patient received  80 cc of contrast. FINDINGS: Aortic Valve: Trileaflet, severely calcified and thickened with severely restricted leaflet opening and minimal calcifications extending into the LVOT. Aorta:  Normal size, no dissection.  Mild diffuse calcifications. Sinotubular Junction:  30 x 30 mm Ascending Thoracic Aorta:  33 x 32 mm Aortic Arch:  24 x 24 mm Descending Thoracic Aorta:  24 x 24 mm Sinus of Valsalva Measurements: Non-coronary:  37 mm Right -coronary:  33 mm Left -coronary:  34 mm Coronary Artery Height above Annulus: Left Main:  16 mm Right Coronary:  24 mm Virtual Basal Annulus Measurements: Maximum/Minimum Diameter:  32 x 22 mm Perimeter:  94 mm Area:  596 mm2 Optimum Fluoroscopic Angle for Delivery:  RAO 2 CRA 1 Dilated pulmonary artery measuring 32 x 29 mm No thrombus in the left atrial appendage. Normal pulmonary vein drainage into the left atrium. No ASD/VSD seen. IMPRESSION: 1. Trileaflet, severely calcified and thickened with severely restricted leaflet opening and minimal calcifications extending into the LVOT. Annular measurements suitable for delivery of a 29 mm Edwards-SAPIEN 3 TAVR valve. 2. Optimum Fluoroscopic Angle for Delivery:  RAO 2 CRA 1 Ena Dawley Electronically Signed: By: Ena Dawley On: 06/15/2016 18:06   Dg Chest Port 1 View  Result Date: 06/23/2016 CLINICAL DATA:  Severe aortic stenosis status post TAVR EXAM: PORTABLE CHEST 1 VIEW COMPARISON:  Chest radiograph from one day prior. FINDINGS: Stable configuration of right internal jugular central venous catheter, median sternotomy wires and aortic valve prosthesis. Stable cardiomediastinal silhouette with mild cardiomegaly and aortic atherosclerosis. No pneumothorax.  Small bilateral pleural effusions appear slightly increased bilaterally. Mild-to-moderate pulmonary edema appears stable. IMPRESSION: 1. No pneumothorax . 2. Small bilateral pleural effusions, slightly increased bilaterally. 3. Mild-to-moderate congestive heart failure, stable . 4. Aortic atherosclerosis. Electronically Signed   By: Ilona Sorrel M.D.   On: 06/23/2016 09:08   Dg Chest Port 1 View  Result Date: 06/22/2016 CLINICAL DATA:  Severe aortic stenosis. EXAM: PORTABLE CHEST 1 VIEW COMPARISON:  06/19/2016 FINDINGS: Sequelae of prior CABG and interval transcatheter aortic valve replacement are identified. A right jugular catheter terminates over the SVC. The cardiomediastinal silhouette is unchanged. Diffuse interstitial coarsening with bilateral upper lobe predominant airspace opacities do not appear significantly changed. No sizable pleural effusion or pneumothorax is identified. IMPRESSION: 1. Interval TAVR.  Right jugular catheter overlies SVC. 2. Unchanged bilateral upper lobe predominant airspace disease. Electronically Signed   By: Logan Bores M.D.   On: 06/22/2016 16:29   Dg Chest Port 1 View  Result Date: 06/19/2016 CLINICAL DATA:  Shortness of breath and congestion EXAM: PORTABLE CHEST 1 VIEW COMPARISON:  06/09/2016, 06/15/2016 FINDINGS: Cardiac shadow is mildly enlarged but stable. Postsurgical changes are again seen. Diffuse changes in the upper lobes are noted bilaterally similar to that seen on recent chest x-ray and recent CT examination. No bony abnormality is noted. IMPRESSION: Stable changes in the upper lobes bilaterally. No new focal abnormality is seen. Electronically Signed   By: Inez Catalina M.D.   On: 06/19/2016 14:07   Dg Chest Port 1 View  Result Date: 06/09/2016 CLINICAL DATA:  Shortness of breath. EXAM: PORTABLE CHEST 1 VIEW COMPARISON:  06/09/2016 at 7:35 a.m. FINDINGS: Sternotomy wires unchanged. Lungs are adequately inflated with continued airspace consolidation most  prominent over the upper lobes right worse than left which is slightly worse over the right mid to upper lung. Minimal patchy bibasilar opacification unchanged. Possible small amount of bilateral pleural fluid unchanged. Mild stable cardiomegaly. Calcified plaque over  the aortic arch. Remainder of the exam is unchanged. IMPRESSION: Multifocal airspace process worse over the upper lobes with possible slight interval worsening over the right upper lobe. Findings are likely due to infection. Possible small amount of bilateral pleural fluid. Mild stable cardiomegaly.  Aortic atherosclerosis. Electronically Signed   By: Marin Olp M.D.   On: 06/09/2016 21:05   Dg Chest Port 1 View  Result Date: 06/09/2016 CLINICAL DATA:  Shortness of Breath EXAM: PORTABLE CHEST 1 VIEW COMPARISON:  June 07, 2016 and December 27, 2010 FINDINGS: There is diffuse interstitial prominence, primarily in the upper lobes superimposed on chronic interstitial thickening. There is patchy airspace opacity in the upper lobes and left mid lung region superimposed on interstitial edema. There is cardiomegaly with pulmonary venous hypertension. There are small pleural effusions bilaterally. There is atherosclerotic calcification in the aorta. Patient is status post coronary artery bypass grafting. No bone lesions. No adenopathy. IMPRESSION: Findings felt to represent congestive heart failure superimposed on chronic interstitial thickening. Relative airspace opacity in portions of the upper lobes likely represents alveolar edema. A degree of superimposed pneumonia in the upper lobes cannot be excluded radiographically. There is aortic atherosclerosis. Appearance is essentially stable compared to 1 day prior. Electronically Signed   By: Lowella Grip III M.D.   On: 06/09/2016 08:04   Ct Angio Chest/abd/pel For Dissection W And/or W/wo  Result Date: 06/16/2016 CLINICAL DATA:  83 year old male with history of severe aortic stenosis.  Preprocedural study prior to potential transcatheter aortic valve replacement (TAVR). EXAM: CT ANGIOGRAPHY CHEST, ABDOMEN AND PELVIS TECHNIQUE: Multidetector CT imaging through the chest, abdomen and pelvis was performed using the standard protocol during bolus administration of intravenous contrast. Multiplanar reconstructed images and MIPs were obtained and reviewed to evaluate the vascular anatomy. CONTRAST:  80 mL of Isovue 370. COMPARISON:  No priors. FINDINGS: CTA CHEST FINDINGS Cardiovascular: Heart size is mildly enlarged with biatrial dilatation. There is no significant pericardial fluid, thickening or pericardial calcification. There is aortic atherosclerosis, as well as atherosclerosis of the great vessels of the mediastinum and the coronary arteries, including calcified atherosclerotic plaque in the left main, left anterior descending and left circumflex coronary arteries. Status post median sternotomy for CABG, including LIMA to the LAD. Mediastinum/Lymph Nodes: Multiple prominent borderline enlarged mediastinal and hilar lymph nodes are noted, likely reactive. Esophagus is unremarkable in appearance. No axillary lymphadenopathy. Lungs/Pleura: Moderate right and small left pleural effusions lie dependently. Throughout the lungs bilaterally there are relatively symmetric areas of ground-glass attenuation and septal thickening which are most evident throughout the mid to upper lungs. Lower lungs appear relatively normal, with a few patchy areas of ground-glass attenuation. Musculoskeletal/Soft Tissues: Median sternotomy wires. There are no aggressive appearing lytic or blastic lesions noted in the visualized portions of the skeleton. CTA ABDOMEN AND PELVIS FINDINGS Hepatobiliary: Liver has a slightly shrunken appearance and nodular contour, suggestive of underlying cirrhosis. No cystic or solid hepatic lesions. No intra or extrahepatic biliary ductal dilatation. Gallbladder is normal in appearance.  Pancreas: No pancreatic mass. No pancreatic ductal dilatation. No pancreatic or peripancreatic fluid or inflammatory changes. Spleen: Unremarkable. Adrenals/Urinary Tract: Bilateral kidneys and bilateral adrenal glands are unremarkable in appearance. No hydroureteronephrosis. Small filling defect in the urinary bladder measuring 0.6 x 1.3 x 1.6 cm (axial image 254 of series 401 and coronal image 108 of series 403), with potential stalk like attachment to the wall of the urinary bladder (coronal image 108 of series 403). There is also some plaque-like irregularity along  the posterior aspect of the urinary bladder wall, best appreciated on sagittal image 124 of series 404. Stomach/Bowel: The appearance of the stomach is normal. There is no pathologic dilatation of small bowel or colon. Vascular/Lymphatic: Aortic atherosclerosis, with vascular findings and measurements pertinent to potential TAVR procedure, as detailed below. 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 Throughout the abdominal aorta there multiple ulcerated plaques. The largest of these plaques is best demonstrated in cross-section on axial image 200 of series 401 adjacent to the inferior mesenteric artery origin, where it appears as a short segment dissection (however, this does not appear to be a true dissection). 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 The celiac axis, superior mesenteric artery and inferior mesenteric artery are all patent without definite hemodynamically significant stenosis. Two right-sided and one left-sided renal arteries are all patent without hemodynamically significant stenosis. No lymphadenopathy noted in the abdomen or pelvis. Reproductive: Prostate gland and seminal vesicles are unremarkable  in appearance. Other: No significant volume of ascites.  No pneumoperitoneum. Musculoskeletal: There are no aggressive appearing lytic or blastic lesions noted in the visualized portions of the skeleton. VASCULAR MEASUREMENTS PERTINENT TO TAVR: AORTA: Minimal Aortic Diameter -  19 x 15 mm Severity of Aortic Calcification -  moderate to severe RIGHT PELVIS: Right Common Iliac Artery - Minimal Diameter - 11.2 x 8.3 mm Tortuosity - severe Calcification - moderate Right External Iliac Artery - Minimal Diameter - 8.7 x 9.1 mm Tortuosity - severe Calcification - mild Right Common Femoral Artery - Focal aneurysmal dilatation up to 17 mm with large ulcerated plaque. Minimal Diameter - 8.5 x 6.3 mm Tortuosity - mild Calcification - moderate LEFT PELVIS: Left Common Iliac Artery - Minimal Diameter - 8.1 x 7.8 mm Tortuosity - severe Calcification - moderate Left External Iliac Artery - Minimal Diameter - 9.1 x 8.5 mm Tortuosity - severe Calcification - mild Left Common Femoral Artery - Minimal Diameter - 8.7 x 7.6 mm Tortuosity - mild Calcification - moderate Review of the MIP images confirms the above findings. IMPRESSION: 1. Vascular findings and measurements pertinent to potential TAVR procedure, as detailed above. From a size criteria, the patient has suitable pelvic arterial access bilaterally. 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 In addition, there is a large ulcerated plaque in the infrarenal abdominal aorta (adjacent to the origin of the inferior mesenteric artery), best appreciated on axial image 200 of series 401, which in the axial plane has an appearance of a very short segment dissection. Caution should be taken when traversing this region with a catheter. 2. Severe thickening calcification of the  aortic valve, compatible with the reported clinical history of severe aortic stenosis. 3. Extensive ground-glass attenuation and septal thickening throughout the mid to upper lungs bilaterally, strongly favored to be related to an acute infection. There may also be a background of very mild interstitial pulmonary edema, best appreciated in the lower lungs. 4. Moderate right and small left pleural effusions lying dependently. 5. Plaque-like irregularity along the posterior aspect of the urinary bladder wall, adjacent to a small filling defect which may have an  attachment to the wall of the urinary bladder. These findings are concerning for potential urothelial neoplasm, and follow-up nonemergent evaluation by Urology is suggested in the near future to better evaluate these findings. 6. Morphologic changes in the liver suggestive of early cirrhosis, as above. 7. Additional incidental findings, as above. Electronically Signed   By: Vinnie Langton M.D.   On: 06/16/2016 09:01    ASSESSMENT/PLAN:  Generalized weakness - continue home health PT, OT and CNA, for therapeutic strengthening exercises; fall precautions  Community acquired pneumonia - has completed antibiotic course and has now resolved  Severe aortic stenosis - S/P TVAR, follow-up with cardiothoracic surgery; echocardiogram on 06/23/16 showed EF 40-45% with normal functioning bioprosthetic stent valve  CAD - no complaints of chest pain; continue metoprolol tartrate 75 mg 1 tab by mouth twice a day and aspirin EC 81 mg 1 tab by mouth daily  Chronic systolic and diastolic CHF - no SOB, continue Lasix 40 mg 1 tab by mouth daily  Hypokalemia - continue KCl ER 20 meq 1 tab by mouth daily Lab Results  Component Value Date   K 4.5 07/03/2016   Atrial fibrillation - continue Cartia XT 240 mg 1 capsule by mouth daily, metoprolol titrate 75 mg 1 tab by mouth twice a day and productive 150 mg 1 capsule by mouth twice a day  Hyperlipidemia - continue  Crestor 10 mg 1 tab by mouth daily  Constipation - continue senna S 2 tabs by mouth daily at bedtime   GERD - continue Protonix 40 mg 1 tab by mouth daily  Candida, skin - continue nystatin cream topically to bilateral groin twice a day 1 week     I have filled out patient's discharge paperwork and written prescriptions.  Patient will receive home health PT, OT, Nursing and CNA.  DME provided:  None  Total discharge time: Greater than 30 minutes Greater than 50% was spent in counseling and coordination of care with the patient.  Discharge time involved coordination of the discharge process with social worker, nursing staff and therapy department. Medical justification for home health services verified.    Nitish Roes C. [place] - NP    [employer] [phone]

## 2016-07-04 NOTE — Anesthesia Postprocedure Evaluation (Addendum)
Anesthesia Post Note  Patient: Richard Simon  Procedure(s) Performed: Procedure(s) (LRB): TRANSCATHETER AORTIC VALVE REPLACEMENT, TRANSFEMORAL (N/A) TRANSESOPHAGEAL ECHOCARDIOGRAM (TEE) (N/A)  Patient location during evaluation: ICU Anesthesia Type: General Level of consciousness: awake and alert Pain management: pain level controlled Vital Signs Assessment: post-procedure vital signs reviewed and stable Respiratory status: spontaneous breathing, nonlabored ventilation, respiratory function stable and patient connected to nasal cannula oxygen Cardiovascular status: blood pressure returned to baseline and stable Postop Assessment: no signs of nausea or vomiting Anesthetic complications: no       Last Vitals:  Vitals:   06/27/16 0354 06/27/16 1044  BP: 121/80 132/73  Pulse: 90 83  Resp: 18   Temp: 36.8 C     Last Pain:  Vitals:   06/27/16 0756  TempSrc:   PainSc: 7                  Velicia Dejager

## 2016-07-05 ENCOUNTER — Ambulatory Visit: Payer: Medicare HMO | Admitting: Cardiology

## 2016-07-06 ENCOUNTER — Encounter: Payer: Self-pay | Admitting: Cardiology

## 2016-07-06 ENCOUNTER — Ambulatory Visit (INDEPENDENT_AMBULATORY_CARE_PROVIDER_SITE_OTHER): Payer: Medicare HMO | Admitting: Cardiology

## 2016-07-06 ENCOUNTER — Other Ambulatory Visit: Payer: Self-pay

## 2016-07-06 VITALS — BP 102/62 | HR 86 | Ht 70.0 in | Wt 165.0 lb

## 2016-07-06 DIAGNOSIS — I2510 Atherosclerotic heart disease of native coronary artery without angina pectoris: Secondary | ICD-10-CM

## 2016-07-06 DIAGNOSIS — E785 Hyperlipidemia, unspecified: Secondary | ICD-10-CM | POA: Diagnosis not present

## 2016-07-06 DIAGNOSIS — I350 Nonrheumatic aortic (valve) stenosis: Secondary | ICD-10-CM

## 2016-07-06 DIAGNOSIS — I5032 Chronic diastolic (congestive) heart failure: Secondary | ICD-10-CM

## 2016-07-06 DIAGNOSIS — Z953 Presence of xenogenic heart valve: Secondary | ICD-10-CM

## 2016-07-06 DIAGNOSIS — I482 Chronic atrial fibrillation: Secondary | ICD-10-CM | POA: Diagnosis not present

## 2016-07-06 DIAGNOSIS — I10 Essential (primary) hypertension: Secondary | ICD-10-CM | POA: Diagnosis not present

## 2016-07-06 DIAGNOSIS — I4821 Permanent atrial fibrillation: Secondary | ICD-10-CM

## 2016-07-06 NOTE — Patient Outreach (Signed)
[name]) Care Management  07/06/2016  Richard Simon 06/16/1929 [id number]       Transition of Care Referral  Referral Date: 07/04/16 Referral Source: [facility] Discharge Report Date of Discharge: 07/05/16 Facility: [place]   Outreach attempt # 1 to patient. Spoke with patient. Patient requested brief call as he was eating breakfast. He states that we was discharged home form [place] on yesterday. He reports that "its too early to tell" how he's doing but glad to be home and everything seems to be going as planned. He has not heard from [facility] services yet and advised him to expect call within 24-48 hrs and to contact agency if he does not hear from anyone. He voiced understanding. States he has supportive family to assist with care needs as needed. Denies any issues with med mgmt and transportation. Patient has f/u appt with cardiologist later today and has to make appt with PCP. Patient appreciative of f/u appt. However,he denies any RN CM needs or concerns at this time.     Plan: RN CM will notify [facility] administrative assistant of case status.   Enzo Montgomery, RN,BSN,CCM [place] Management Telephonic Care Management Coordinator Direct Phone: [phone] Toll Free: [phone] Fax: [phone]

## 2016-07-06 NOTE — Progress Notes (Signed)
PCP: Merrilee Seashore, MD  Clinic Note: Chief Complaint  Patient presents with  . Hospitalization Follow-up    Presented with symptomatic aortic stenosis/heart failure after diuresis had TAVR    HPI: Richard Simon is a 83 y.o. male with a PMH below who presents today for hospital follow-up status post TAVR by Dr. Burt Knack. He has a history of permanentatrial fibrillation on Pradaxa, CAD s/p CABG in 2004 (LIMA to LAD, SVG-Diag, seqSVG-OM1-OM2) , HTN, HLD and AS - (s/p TAVR)  Freddi Starr was last seen when he presented to the hospital on 06/07/2016 essentially with new onset symptomatic aortic stenosis. He was almost due for his outpatient follow-up echo, presented to the hospital with symptoms first. He was noted to be in acute heart failure but had profound exertional dyspnea and fatigue and hyponatremia. Likely triggered by pneumonia. - His inpatient echocardiogram revealed now severe borderline critical aortic stenosis, therefore he underwent his cardiac catheterization and evaluation by the TAVR team (Drs. [employer]) --> I performed his cardiac catheterization, but did not follow him following that. He apparently did relatively well and had been aggressively diuresed during his hospital stay but was placed on by mouth Lasix for discharge.  Recent Hospitalizations: February 21- June 27, 2016  Studies Reviewed:   2-D Echo 06/08/2016: Diffuse hypokinesis. EF 45-50%. Severely calcified aortic valve with critical aortic stenosis and moderate regurgitation (valve area estimated roughly 0.26 cm). Heart catheterization of the mitral valve annulus with mild MR. Bilateral atria but mildly dilated. Severely increased pulmonary pressures (63 mmHg)  Right & Left Heart Catheterization June 13 2016:4/4 widely patent grafts with severe native coronary disease. Mild secondary pulmonary hypertension with wedge pressure 25 mmHg and PA pressure 48/23/33 mmHg) Diagnostic Diagram     TAVR:  transfemoral -Edward Sapiens 3 29 mm Aortic Bioprosthesis. (Dr. Burt Knack)  Intraoperative TEE 06/22/2016: Pre-TAVR severely calcified aortic valve with moderate AR and severe left ear. Mean gradient 40 mmHg. Post TAVR: EF 50-55%. Well-placed Sapien 3 29 mm bioprosthetic aortic valve well seated. No significant paravalvular leak. New peak gradient 3 mmHg.  Transthoracic echo 06/23/2016: EF 40-45% with diffuse hypokinesis. High LV filling pressures. Well-seated Edwards's Sapien 3 TAVR bioprosthesis.  - AVA ~1.5. PA pressures now estimated 43 mmHg.  Interval History: Richard Simon presents today overall feeling pretty good. He says his legs are just a little bit weak, and he has not yet quite gotten his strength back. He still is recovering from the initial part of the hospitalization prior to his TAVR. He is looking 40 getting into rehabilitation. He feels a little bit dry and has been thirsty a lot. He has not had any further PND orthopnea or edema. No more heart failure symptoms. He never did have any chest tightness pressure/pain with rest or exertion. Besides just being a little bit tired and fatigued, he denies any exertional dyspnea either.  As is usually the case, he still denies any sensation of being in atrial fibrillation. No rapid heartbeats. No syncope or near-syncope, TIA or amaurosis fugax symptoms. No melena, hematochezia or hematuria. No epistaxis. No claudication.  He has lost quite a bit overweight, not fully intentional. Prior to him getting sick and go to the hospital, he was still doing his routine exercise and walking as he had been doing for several years and had not noted any symptoms.  ROS: A comprehensive was performed. Review of Systems  Constitutional: Positive for malaise/fatigue (Just not quite yet Get up and go to do things). Negative for chills  and fever.  HENT: Positive for hearing loss (Chronic).   Respiratory: Negative for shortness of breath.   Cardiovascular: Negative for leg  swelling.  Gastrointestinal: Negative for heartburn.  Genitourinary: Negative for hematuria.  Musculoskeletal: Negative for falls.  Neurological: Positive for dizziness (Sometimes with change in position).  Psychiatric/Behavioral: Negative for depression and memory loss. The patient is not nervous/anxious and does not have insomnia.   All other systems reviewed and are negative.   Past Medical History:  Diagnosis Date  . Atrial fibrillation, permanent (Richard Simon)    Rate controlled. Anticoagulation with Pradaxa.  . CAD, multiple vessel 2004   S/P 4-vessel CABG in 2004: Pre-TAVR CATH: Occluded OM1 and distal circumflex PRIOR TO GRAFT; 70&80% mid LAD stenosis prior to LIMA graft, 95% D1 stenosis prior to graft. 4/4 Grafts patent (LIMA-LAD, SVG-DIAG, SVG-OM-OM).    . Dyslipidemia, goal LDL below 70   . Hypertension     very well controlled  . S/P CABG x 4 2004   Post CABG  . 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 Burt Knack: Oletta Lamas Sapient 3 29 mm biprosthetic valve  . Severe aortic stenosis by prior echocardiogram - s/pTAVR 01/2013; 06/2014   05/2016: Presented with progressively worsening aortic stenosis and referred for TAVR. - Underwent TAVR March 2018    Past Surgical History:  Procedure Laterality Date  . CARDIAC CATHETERIZATION  12/11/2002   Recommendation-CABG; 80% mid LAD, 70% distal LAD. Mid circumflex 95%, OM1 95%. AV groove circumflex 95%. Proximal RCA 100%  . CORONARY ARTERY BYPASS GRAFT  12/15/2002   LIMA-LAD, SVG-diagonal, SVG-OM1-OM2 sequential  Carlton Adam MYOVIEW  December 2012   Fixed basal inferior diaphragmatic attenuation versusinfarct, no ischemia  . MULTIPLE EXTRACTIONS WITH ALVEOLOPLASTY N/A 06/20/2016   Procedure: EXTRACTION of #7, 12, 13, and 15 WITH ALVEOLOPLASTY and gross debridement of teeth;  Surgeon: Lenn Cal, DDS;  Location: [place];  Service: Oral Surgery;  Laterality: N/A;  . RIGHT HEART CATH AND CORONARY/GRAFT  ANGIOGRAPHY N/A 06/13/2016   Procedure: Right Heart Cath and Coronary/Graft Angiography;  Surgeon: Leonie Man, MD;  Location: [facility];  Service: Cardiovascular: Occluded OM1 and distal circumflex PRIOR TO GRAFT; 70&80% mid LAD stenosis prior to LIMA graft, 95% D1 stenosis prior to graft. 4/4 Grafts patent (LIMA-LAD, SVG-DIAG, SVG-OM-OM).   Mild 2 pulmonary hypertension: PCWP 25 mmHg. PA peak/mean 48/23/33 mmH  . TEE WITHOUT CARDIOVERSION N/A 06/22/2016   Procedure: TRANSESOPHAGEAL ECHOCARDIOGRAM (TEE);  Surgeon: Sherren Mocha, MD;  Location: [place];  Service: Open Heart Surgery;  Laterality: N/A;  . TRANSCATHETER AORTIC VALVE REPLACEMENT, TRANSFEMORAL N/A 06/22/2016   Procedure: TRANSCATHETER AORTIC VALVE REPLACEMENT, TRANSFEMORAL;  Surgeon: Sherren Mocha, MD;  Location: [place];  Service: Open Heart Surgery: Oletta Lamas Sapient 3 29 mm biprosthetic walve.  . TRANSTHORACIC ECHOCARDIOGRAM  06/23/2016   Post TAVR:  EF 40-45% with diffuse hypokinesis. High LV filling pressures. Well-seated Edwards's Sapien 3 TAVR bioprosthesis.  - AVA ~1.5. PA pressures now estimated 43 mmHg.  Marland Kitchen TRANSTHORACIC ECHOCARDIOGRAM  October 2014   Moderate-severe AS with mild AI. Peak and mean gradients no significant change: 39 mmHg and 25 mmHg;; mild LV dilation with EF 55-60%. Elevated LAP. Moderate MR. Mild to moderate LA dilation. Mildly elevated RV pressure.  . TRANSTHORACIC ECHOCARDIOGRAM  March 2016   EF 60-65%. Mod-Severe AS, AVA~0.69, Mn-Pk gradient 32 mmHg -- 55 mmHg, severe LA dilation, moderate-severe RA dilation: Progression of disease    Current Meds  Medication Sig  . Acetaminophen  500 MG coapsule Take 1 tablet by mouth every 6 (six) hours as needed for pain.   Marland Kitchen CARTIA XT 240 MG 24 hr capsule TAKE 1 CAPSULE EVERY DAY  . co-enzyme Q-10 30 MG capsule Take 1 capsule by mouth daily.  . dabigatran (PRADAXA) 150 MG CAPS capsule Take 1 capsule (150 mg total) by mouth 2 (two) times daily.  . furosemide  (LASIX) 40 MG tablet Take 40 mg by mouth daily as needed.  . Metoprolol Tartrate 75 MG TABS Take 75 mg by mouth 2 (two) times daily.  . Omega-3 Fatty Acids (FISH OIL) 1200 MG CAPS Take 1,200 mg by mouth 2 (two) times daily.  . polyethylene glycol (MIRALAX / GLYCOLAX) packet Take 17 g by mouth daily as needed for mild constipation.  . potassium chloride SA (K-DUR,KLOR-CON) 20 MEQ tablet Take 1 tablet (20 mEq total) by mouth daily.  . rosuvastatin (CRESTOR) 10 MG tablet TAKE 1 TABLET (10 MG TOTAL) BY MOUTH DAILY.  Marland Kitchen sennosides-docusate sodium (SENOKOT-S) 8.6-50 MG tablet Take 2 tablets by mouth at bedtime.  Marland Kitchen UNABLE TO FIND Apply 1 application topically 2 (two) times daily. Med Name: Nystatin/Zinc/Tac 0.1%   Apply to scrotum, bilateral buttocks, bilateral inner thighs, bilateral groin  . [DISCONTINUED] aspirin EC 81 MG tablet Take 81 mg by mouth daily.  . [DISCONTINUED] furosemide (LASIX) 40 MG tablet Take 1 tablet (40 mg total) by mouth daily.  . [DISCONTINUED] pantoprazole (PROTONIX) 40 MG tablet Take 1 tablet (40 mg total) by mouth daily.    Allergies  Allergen Reactions  . No Known Allergies     Social History   Social History  . Marital status: Married    Spouse name: N/A  . Number of children: N/A  . Years of education: N/A   Social History Main Topics  . Smoking status: Former Smoker    Quit date: 02/12/1975  . Smokeless tobacco: Never Used  . Alcohol use 8.4 oz/week    14 Glasses of wine per week  . Drug use: Unknown  . Sexual activity: Not Asked   Other Topics Concern  . None   Social History Narrative   Married. Father of 9, grandfather 11, great-grandfather 3.   Very active, but without routine exercise. Does yard work and intermittent walking, but nothing routine.   Does not smoke. Occasional alcohol.    family history includes Hip fracture in his mother.  Wt Readings from Last 3 Encounters:  07/06/16 74.8 kg (165 lb)  07/04/16 74.7 kg (164 lb 10.9 oz)    06/28/16 74.7 kg (164 lb 9.6 oz)    PHYSICAL EXAM BP 102/62   Pulse 86   Ht 5\' 10"  (1.778 m)   Wt 74.8 kg (165 lb)   BMI 23.68 kg/m  General appearance: alert, cooperative, appears stated age, no distress ; He is notably thinner, borderline, appearing. Somewhat disheveled/unshaven Neck: no adenopathy, no carotid bruit and no JVD Lungs: clear to auscultation bilaterally, normal percussion bilaterally and non-labored Heart: Irregularly irregular rhythm with controlled rate. Normal S1 and soft S2 2. No S3 gallop. Very trace systolic murmur heard now at the left upper sternal border. No diastolic murmur heard. He still has a soft 1/6 HSM at apex. Abdomen: soft, non-tender; bowel sounds normal; no masses,  no organomegaly; no HJR Extremities: extremities normal, atraumatic, no cyanosis, or edema Pulses: 2+ and symmetric;  Skin: mobility and turgor normal, no edema and Still has some stigmata from his IV lines etc. with mild bruising.  Neurologic: Mental status: Alert, oriented, thought content appropriate Cranial nerves: normal (II-XII grossly intact)    Adult ECG Report N/A  Other studies Reviewed: Additional studies/ records that were reviewed today include:  Recent Labs:    Lab Results  Component Value Date   CREATININE 0.8 07/03/2016   BUN 7 07/03/2016   NA 136 (A) 07/03/2016   K 4.5 07/03/2016   CL 99 (L) 06/25/2016   CO2 28 06/25/2016   Lab Results  Component Value Date   WBC 6.7 07/03/2016   HGB 12.7 (A) 07/03/2016   HCT 38 (A) 07/03/2016   MCV 93.9 06/27/2016   PLT 263 07/03/2016     ASSESSMENT / PLAN: Problem List Items Addressed This Visit    Atrial fibrillation, permanent ([place]); CHA2DS2-VASc Score 4 - on Pradaxa. BB & CCB for RATE control (Chronic)    Rate well-controlled. Totally asymptomatic. Hopefully as he continues to recover, his blood pressure will improve and we can continue current medications. Pretty asymptomatic overall. Anticoagulated with  Pradaxa. - He has done well with Pradaxa, therefore we have continued it as opposed to switching to [facility] or Xarelto      Relevant Medications   furosemide (LASIX) 40 MG tablet   Chronic diastolic heart failure (HCC) (Chronic)    EF was slightly reduced when he was found to have severe aortic stenosis. Will need to reassess along with his aortic valve per Dr. Burt Knack. Currently euvolemic if not possibly dry. Blood pressure would not tolerate restarting his ACE inhibitor. Would simply continue beta blocker and calcium blocker as ordered. We can change his diuretic to as needed as opposed to standing.      Relevant Medications   furosemide (LASIX) 40 MG tablet   Dyslipidemia, goal LDL below 70 (Chronic)    No current labs. He is on daily Crestor. Apparently his labs are followed by his PCP. Would like to get those results, for now would simply continue current dose of statin.      Relevant Medications   furosemide (LASIX) 40 MG tablet   Essential hypertension (Chronic)    Blood pressures well controlled on current meds. Pending a little bit low. If his pressures continued to be this level, I may need to back off on either the Cartia or the beta blocker. He is taking calcitriol beta blocker at alternate times the day, and is not back on his ACE inhibitor.      Relevant Medications   furosemide (LASIX) 40 MG tablet   Multivessel Native CAD - s/p CABGx4: LIMA to LAD, SVG-Diag, seqSVG-OM1-OM2 (Chronic)    He had cardiac catheterization as part of his preoperative evaluation. He had all 4 of 4 grafts patent. He has not had any anginal symptoms with rest or exertion since his TAVR. He had a Myoview in 2013 that showed a fixed basal inferior defect thought to be diaphragmatic attenuation but also could've been infarct. Since he just had a heart catheterization, he would need another evaluation for another 3-4 years at which time he may decide he does not want that.  Plan: Continue beta blocker  and statin. He also has Cartia for rate control and antianginal effect. Not on aspirin or Plavix because of Pradaxa.      Relevant Medications   furosemide (LASIX) 40 MG tablet   S/p TAVR (transcatheter aortic valve replacement), bioprosthetic: Dr, Burt Knack - Primary (Chronic)    Notable change in symptoms. No further heart failure or dyspnea symptoms postprocedure. Murmur is all but  gone. Minimal leak noted on follow-up echo. We'll defer to Dr. Burt Knack for timing of follow-up echo.      Severe aortic stenosis (Chronic)    Status post TAVR during his last hospitalization. Now barely audible murmur. We'll defer scheduling follow-up echocardiogram to the TAVR team (Dr. Burt Knack) Was not placed on Plavix because he is already on Pradaxa for A. fib.      Relevant Medications   furosemide (LASIX) 40 MG tablet      Current medicines are reviewed at length with the patient today. (+/- concerns) n/a The following changes have been made: May need PRN lasix   Patient Instructions  Medication Instructions: Your physician recommends that you continue on your current medications as directed. Please refer to the Current Medication list given to you today.   Follow-Up: Your physician recommends that you schedule a follow-up appointment in: 4-6 months with Dr. Ellyn Hack.  Your physician recommends that you schedule a follow-up appointment with Dr. Burt Knack on 07/27/16 at 4pm after echocardiogram scheduled at 3 pm.  If you need a refill on your cardiac medications before your next appointment, please call your pharmacy.    Studies Ordered:   No orders of the defined types were placed in this encounter.     Glenetta Hew, M.D., M.S. Interventional Cardiologist   Pager # [phone] Phone # [phone] [address]. [place], [place] [address]

## 2016-07-06 NOTE — Telephone Encounter (Signed)
This encounter was created in error - please disregard.

## 2016-07-06 NOTE — Patient Instructions (Signed)
Medication Instructions: Your physician recommends that you continue on your current medications as directed. Please refer to the Current Medication list given to you today.   Follow-Up: Your physician recommends that you schedule a follow-up appointment in: 4-6 months with Dr. Ellyn Hack.  Your physician recommends that you schedule a follow-up appointment with Dr. Burt Knack on 07/27/16 at 4pm after echocardiogram scheduled at 3 pm.  If you need a refill on your cardiac medications before your next appointment, please call your pharmacy.

## 2016-07-12 ENCOUNTER — Encounter: Payer: Self-pay | Admitting: Cardiology

## 2016-07-12 DIAGNOSIS — M6281 Muscle weakness (generalized): Secondary | ICD-10-CM | POA: Diagnosis not present

## 2016-07-12 DIAGNOSIS — K219 Gastro-esophageal reflux disease without esophagitis: Secondary | ICD-10-CM | POA: Diagnosis not present

## 2016-07-12 DIAGNOSIS — I110 Hypertensive heart disease with heart failure: Secondary | ICD-10-CM | POA: Diagnosis not present

## 2016-07-12 DIAGNOSIS — I2510 Atherosclerotic heart disease of native coronary artery without angina pectoris: Secondary | ICD-10-CM | POA: Diagnosis not present

## 2016-07-12 DIAGNOSIS — K5900 Constipation, unspecified: Secondary | ICD-10-CM | POA: Diagnosis not present

## 2016-07-12 DIAGNOSIS — I482 Chronic atrial fibrillation: Secondary | ICD-10-CM | POA: Diagnosis not present

## 2016-07-12 DIAGNOSIS — E785 Hyperlipidemia, unspecified: Secondary | ICD-10-CM | POA: Diagnosis not present

## 2016-07-12 DIAGNOSIS — I5042 Chronic combined systolic (congestive) and diastolic (congestive) heart failure: Secondary | ICD-10-CM | POA: Diagnosis not present

## 2016-07-12 DIAGNOSIS — Z48812 Encounter for surgical aftercare following surgery on the circulatory system: Secondary | ICD-10-CM | POA: Diagnosis not present

## 2016-07-12 NOTE — Assessment & Plan Note (Signed)
He had cardiac catheterization as part of his preoperative evaluation. He had all 4 of 4 grafts patent. He has not had any anginal symptoms with rest or exertion since his TAVR. He had a Myoview in 2013 that showed a fixed basal inferior defect thought to be diaphragmatic attenuation but also could've been infarct. Since he just had a heart catheterization, he would need another evaluation for another 3-4 years at which time he may decide he does not want that.  Plan: Continue beta blocker and statin. He also has Cartia for rate control and antianginal effect. Not on aspirin or Plavix because of Pradaxa.

## 2016-07-12 NOTE — Assessment & Plan Note (Signed)
No current labs. He is on daily Crestor. Apparently his labs are followed by his PCP. Would like to get those results, for now would simply continue current dose of statin.

## 2016-07-12 NOTE — Assessment & Plan Note (Signed)
Notable change in symptoms. No further heart failure or dyspnea symptoms postprocedure. Murmur is all but gone. Minimal leak noted on follow-up echo. We'll defer to Dr. Burt Knack for timing of follow-up echo.

## 2016-07-12 NOTE — Assessment & Plan Note (Signed)
EF was slightly reduced when he was found to have severe aortic stenosis. Will need to reassess along with his aortic valve per Dr. Burt Knack. Currently euvolemic if not possibly dry. Blood pressure would not tolerate restarting his ACE inhibitor. Would simply continue beta blocker and calcium blocker as ordered. We can change his diuretic to as needed as opposed to standing.

## 2016-07-12 NOTE — Assessment & Plan Note (Signed)
Status post TAVR during his last hospitalization. Now barely audible murmur. We'll defer scheduling follow-up echocardiogram to the TAVR team (Dr. Burt Knack) Was not placed on Plavix because he is already on Pradaxa for A. fib.

## 2016-07-12 NOTE — Assessment & Plan Note (Addendum)
Blood pressures well controlled on current meds. Pending a little bit low. If his pressures continued to be this level, I may need to back off on either the Cartia or the beta blocker. He is taking calcitriol beta blocker at alternate times the day, and is not back on his ACE inhibitor.

## 2016-07-12 NOTE — Assessment & Plan Note (Signed)
Rate well-controlled. Totally asymptomatic. Hopefully as he continues to recover, his blood pressure will improve and we can continue current medications. Pretty asymptomatic overall. Anticoagulated with Pradaxa. - He has done well with Pradaxa, therefore we have continued it as opposed to switching to [facility] or Xarelto

## 2016-07-13 ENCOUNTER — Encounter: Payer: Self-pay | Admitting: Cardiovascular Disease

## 2016-07-14 NOTE — Telephone Encounter (Signed)
This encounter was created in error - please disregard.

## 2016-07-19 DIAGNOSIS — K219 Gastro-esophageal reflux disease without esophagitis: Secondary | ICD-10-CM | POA: Diagnosis not present

## 2016-07-19 DIAGNOSIS — Z48812 Encounter for surgical aftercare following surgery on the circulatory system: Secondary | ICD-10-CM | POA: Diagnosis not present

## 2016-07-19 DIAGNOSIS — I110 Hypertensive heart disease with heart failure: Secondary | ICD-10-CM | POA: Diagnosis not present

## 2016-07-19 DIAGNOSIS — I5042 Chronic combined systolic (congestive) and diastolic (congestive) heart failure: Secondary | ICD-10-CM | POA: Diagnosis not present

## 2016-07-19 DIAGNOSIS — I482 Chronic atrial fibrillation: Secondary | ICD-10-CM | POA: Diagnosis not present

## 2016-07-19 DIAGNOSIS — M6281 Muscle weakness (generalized): Secondary | ICD-10-CM | POA: Diagnosis not present

## 2016-07-19 DIAGNOSIS — E785 Hyperlipidemia, unspecified: Secondary | ICD-10-CM | POA: Diagnosis not present

## 2016-07-19 DIAGNOSIS — K5900 Constipation, unspecified: Secondary | ICD-10-CM | POA: Diagnosis not present

## 2016-07-19 DIAGNOSIS — I2510 Atherosclerotic heart disease of native coronary artery without angina pectoris: Secondary | ICD-10-CM | POA: Diagnosis not present

## 2016-07-20 ENCOUNTER — Telehealth: Payer: Self-pay | Admitting: Cardiology

## 2016-07-20 NOTE — Telephone Encounter (Signed)
The same dose started prior to his valve procedure. We may need to do a brain MRI for stroke symptoms did not get better.  Star

## 2016-07-20 NOTE — Telephone Encounter (Signed)
New Message   Pt would like to speak to you about his heart operation from last month , he would not specify what it was regarding

## 2016-07-20 NOTE — Telephone Encounter (Signed)
Spoke with pt, he called to report dizziness that he has had over the last several months but it seems to be getting worse. He reports it can occur at anytime not just when standing from sitting. His bp this morning and 127/70. He has not checked his bp when dizzy. Medications confirmed with the patient. He also reports an occ flashing in his eyes that will go away quickly and he reports his dizziness does seem to get better after eating or drinking. Ask patient to take his bp when he feels dizzy so we can tell if that is a cause. Also ask the patient to try Claritin for the possibility of vertigo symptoms. Pt agreed with this plan and wanted dr harding to be aware of what was going on.

## 2016-07-21 NOTE — Telephone Encounter (Signed)
Spoke with pt, aware of dr Darcus Pester recommendations. He has an appt to see dr cooper next week.

## 2016-07-24 ENCOUNTER — Telehealth: Payer: Self-pay | Admitting: Cardiology

## 2016-07-24 NOTE — Telephone Encounter (Signed)
New message      Pt has a question regarding the procedure he had last month.  He would not give me any more info

## 2016-07-24 NOTE — Telephone Encounter (Signed)
Pt of Dr. Marlowe Alt to patient. Notes he had procedure last month (TAVR) Was aware that the procedure would not have been able to be done without the extraction of 4 teeth. His insurance is denying coverage for the extractions unless he has a letter of support from Dr. Ellyn Hack stating this was a medical necessity for the procedure. Pt is bringing insurance paperwork up today for signature, and is also requesting the letter. Aware I will make sure this paperwork gets in hand to Dr. Ellyn Hack and he can review -- pt aware we will then call him back when Dr. Ellyn Hack has these items ready for him. Pt expressed understanding and thanks.

## 2016-07-25 ENCOUNTER — Other Ambulatory Visit: Payer: Self-pay | Admitting: Physician Assistant

## 2016-07-25 ENCOUNTER — Other Ambulatory Visit: Payer: Self-pay | Admitting: Cardiology

## 2016-07-25 DIAGNOSIS — K5900 Constipation, unspecified: Secondary | ICD-10-CM | POA: Diagnosis not present

## 2016-07-25 DIAGNOSIS — Z48812 Encounter for surgical aftercare following surgery on the circulatory system: Secondary | ICD-10-CM | POA: Diagnosis not present

## 2016-07-25 DIAGNOSIS — I2510 Atherosclerotic heart disease of native coronary artery without angina pectoris: Secondary | ICD-10-CM | POA: Diagnosis not present

## 2016-07-25 DIAGNOSIS — K219 Gastro-esophageal reflux disease without esophagitis: Secondary | ICD-10-CM | POA: Diagnosis not present

## 2016-07-25 DIAGNOSIS — I482 Chronic atrial fibrillation: Secondary | ICD-10-CM | POA: Diagnosis not present

## 2016-07-25 DIAGNOSIS — E785 Hyperlipidemia, unspecified: Secondary | ICD-10-CM | POA: Diagnosis not present

## 2016-07-25 DIAGNOSIS — I110 Hypertensive heart disease with heart failure: Secondary | ICD-10-CM | POA: Diagnosis not present

## 2016-07-25 DIAGNOSIS — I5042 Chronic combined systolic (congestive) and diastolic (congestive) heart failure: Secondary | ICD-10-CM | POA: Diagnosis not present

## 2016-07-25 DIAGNOSIS — M6281 Muscle weakness (generalized): Secondary | ICD-10-CM | POA: Diagnosis not present

## 2016-07-25 NOTE — Telephone Encounter (Signed)
LEFT MESSAGE TO CALL BACK -IN REGARDS TO LETTER NEEDED.

## 2016-07-26 DIAGNOSIS — I2510 Atherosclerotic heart disease of native coronary artery without angina pectoris: Secondary | ICD-10-CM | POA: Diagnosis not present

## 2016-07-26 DIAGNOSIS — I482 Chronic atrial fibrillation: Secondary | ICD-10-CM | POA: Diagnosis not present

## 2016-07-26 DIAGNOSIS — K5900 Constipation, unspecified: Secondary | ICD-10-CM | POA: Diagnosis not present

## 2016-07-26 DIAGNOSIS — K219 Gastro-esophageal reflux disease without esophagitis: Secondary | ICD-10-CM | POA: Diagnosis not present

## 2016-07-26 DIAGNOSIS — Z48812 Encounter for surgical aftercare following surgery on the circulatory system: Secondary | ICD-10-CM | POA: Diagnosis not present

## 2016-07-26 DIAGNOSIS — I5042 Chronic combined systolic (congestive) and diastolic (congestive) heart failure: Secondary | ICD-10-CM | POA: Diagnosis not present

## 2016-07-26 DIAGNOSIS — M6281 Muscle weakness (generalized): Secondary | ICD-10-CM | POA: Diagnosis not present

## 2016-07-26 DIAGNOSIS — I110 Hypertensive heart disease with heart failure: Secondary | ICD-10-CM | POA: Diagnosis not present

## 2016-07-26 DIAGNOSIS — E785 Hyperlipidemia, unspecified: Secondary | ICD-10-CM | POA: Diagnosis not present

## 2016-07-26 NOTE — Telephone Encounter (Signed)
Follow up  Pt voiced wanting to speak with nurse or for someone to give him a call back.  Pt voiced Hilda Blades since Ovid Curd and Ivin Booty are out of the office today.

## 2016-07-26 NOTE — Telephone Encounter (Signed)
Rx(s) sent to pharmacy electronically.  

## 2016-07-27 ENCOUNTER — Ambulatory Visit (INDEPENDENT_AMBULATORY_CARE_PROVIDER_SITE_OTHER): Payer: Medicare HMO | Admitting: Cardiovascular Disease

## 2016-07-27 ENCOUNTER — Ambulatory Visit (HOSPITAL_COMMUNITY): Payer: Medicare HMO | Attending: Internal Medicine

## 2016-07-27 ENCOUNTER — Encounter: Payer: Self-pay | Admitting: Cardiovascular Disease

## 2016-07-27 ENCOUNTER — Other Ambulatory Visit: Payer: Self-pay

## 2016-07-27 VITALS — BP 132/74 | HR 65 | Ht 70.0 in | Wt 171.0 lb

## 2016-07-27 DIAGNOSIS — Z952 Presence of prosthetic heart valve: Secondary | ICD-10-CM | POA: Diagnosis not present

## 2016-07-27 DIAGNOSIS — I361 Nonrheumatic tricuspid (valve) insufficiency: Secondary | ICD-10-CM | POA: Diagnosis not present

## 2016-07-27 DIAGNOSIS — I340 Nonrheumatic mitral (valve) insufficiency: Secondary | ICD-10-CM | POA: Diagnosis not present

## 2016-07-27 DIAGNOSIS — I350 Nonrheumatic aortic (valve) stenosis: Secondary | ICD-10-CM | POA: Diagnosis not present

## 2016-07-27 DIAGNOSIS — Z953 Presence of xenogenic heart valve: Secondary | ICD-10-CM

## 2016-07-27 MED ORDER — AMOXICILLIN 500 MG PO TABS: ORAL_TABLET | ORAL | 2 refills | Status: DC

## 2016-07-27 NOTE — Patient Instructions (Addendum)
Medication Instructions:  Your physician recommends that you continue on your current medications as directed. Please refer to the Current Medication list given to you today.  PLEASE REVIEW YOUR MEDICATIONS AT HOME AND CONFIRM THAT YOU ARE NOT TAKING ATENOLOL OR LISINOPRIL.   Labwork: No new orders.   Testing/Procedures: Your physician has requested that you have an echocardiogram in 1 YEAR. Echocardiography is a painless test that uses sound waves to create images of your heart. It provides your doctor with information about the size and shape of your heart and how well your heart's chambers and valves are working. This procedure takes approximately one hour. There are no restrictions for this procedure.  Follow-Up: Your physician wants you to follow-up in: 1 YEAR with Dr Burt Knack.  You will receive a reminder letter in the mail two months in advance. If you don't receive a letter, please call our office to schedule the follow-up appointment.   Any Other Special Instructions Will Be Listed Below (If Applicable).  Your physician discussed the importance of taking an antibiotic prior to any dental, gastrointestinal, genitourinary procedures to prevent damage to the heart valves from infection. You were given a prescription for an antibiotic based on current SBE prophylaxis guidelines.   If you need a refill on your cardiac medications before your next appointment, please call your pharmacy.

## 2016-07-27 NOTE — Telephone Encounter (Signed)
Spoke to patient - ask patient to speak with Dr Burt Knack today in regards to letter that is needed for insurance. Patient verbalized understanding.

## 2016-07-27 NOTE — Telephone Encounter (Signed)
LEFT MESSAGE TO CALL BACK

## 2016-07-27 NOTE — Progress Notes (Signed)
Cardiology Office Note Date:  07/29/2016   ID:  Richard Simon, DOB 05-Sep-1929, MRN [id number]  PCP:  Richard Seashore, MD  Cardiologist:  Richard Mocha, MD    Chief Complaint  Patient presents with  . Fatigue     History of Present Illness: Richard Simon is a 83 y.o. male who presents for 30-day TAVR follow-up.   The patient has a long history of ischemic heart disease and aortic stenosis. He has been followed with moderate to severe aortic stenosis because of asymptomatic clinical status. He ultimately was hospitalized in February with acute systolic heart failure and had a prolonged hospitalization (2/21-3/12). He was found to have critical aortic stenosis and was treated with TAVR using a 29 mm sapien 3 heart valve 06/22/2016 via a percutaneous transfemoral approach. He had no postoperative complications. He presents today for follow-up evaluation. He was recently seen by Richard Simon and his complete note is reviewed.  He is here alone today. He's back to living independently after [facility] for rehab after hospital discharge. His breathing is significantly improved. Primary complaint is leg heaviness/weakness. No lightheadedness or syncope. No chest pain. His wife is in SNF in [place] where his son lives and they are trying to decide on next steps for her care.    Past Medical History:  Diagnosis Date  . Atrial fibrillation, permanent ([place])    Rate controlled. Anticoagulation with Pradaxa.  . CAD, multiple vessel 2004   S/P 4-vessel CABG in 2004: Pre-TAVR CATH: Occluded OM1 and distal circumflex PRIOR TO GRAFT; 70&80% mid LAD stenosis prior to LIMA graft, 95% D1 stenosis prior to graft. 4/4 Grafts patent (LIMA-LAD, SVG-DIAG, SVG-OM-OM).    . Dyslipidemia, goal LDL below 70   . Hypertension     very well controlled  . S/P CABG x 4 2004   Post CABG  . 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 Burt Simon: Richard Simon Sapient 3 29 mm biprosthetic valve    . Severe aortic stenosis by prior echocardiogram - s/pTAVR 01/2013; 06/2014   05/2016: Presented with progressively worsening aortic stenosis and referred for TAVR. - Underwent TAVR March 2018    Past Surgical History:  Procedure Laterality Date  . CARDIAC CATHETERIZATION  12/11/2002   Recommendation-CABG; 80% mid LAD, 70% distal LAD. Mid circumflex 95%, OM1 95%. AV groove circumflex 95%. Proximal RCA 100%  . CORONARY ARTERY BYPASS GRAFT  12/15/2002   LIMA-LAD, SVG-diagonal, SVG-OM1-OM2 sequential  Richard Simon MYOVIEW  December 2012   Fixed basal inferior diaphragmatic attenuation versusinfarct, no ischemia  . MULTIPLE EXTRACTIONS WITH ALVEOLOPLASTY N/A 06/20/2016   Procedure: EXTRACTION of #7, 12, 13, and 15 WITH ALVEOLOPLASTY and gross debridement of teeth;  Surgeon: Richard Simon, DDS;  Location: [facility];  Service: Oral Surgery;  Laterality: N/A;  . RIGHT HEART CATH AND CORONARY/GRAFT ANGIOGRAPHY N/A 06/13/2016   Procedure: Right Heart Cath and Coronary/Graft Angiography;  Surgeon: Richard Man, MD;  Location: [facility];  Service: Cardiovascular: Occluded OM1 and distal circumflex PRIOR TO GRAFT; 70&80% mid LAD stenosis prior to LIMA graft, 95% D1 stenosis prior to graft. 4/4 Grafts patent (LIMA-LAD, SVG-DIAG, SVG-OM-OM).   Mild 2 pulmonary hypertension: PCWP 25 mmHg. PA peak/mean 48/23/33 mmH  . TEE WITHOUT CARDIOVERSION N/A 06/22/2016   Procedure: TRANSESOPHAGEAL ECHOCARDIOGRAM (TEE);  Surgeon: Richard Mocha, MD;  Location: [facility];  Service: Open Heart Surgery;  Laterality: N/A;  . TRANSCATHETER AORTIC VALVE REPLACEMENT, TRANSFEMORAL N/A 06/22/2016   Procedure: TRANSCATHETER AORTIC VALVE REPLACEMENT, TRANSFEMORAL;  Surgeon: Richard Mocha, MD;  Location: [facility] OR;  Service: Open Heart Surgery: Edwards Sapient 3 29 mm biprosthetic walve.  . TRANSTHORACIC ECHOCARDIOGRAM  06/23/2016   Post TAVR:  EF 40-45% with diffuse hypokinesis. High LV filling pressures. Well-seated Edwards's Sapien 3  TAVR bioprosthesis.  - AVA ~1.5. PA pressures now estimated 43 mmHg.  Marland Kitchen TRANSTHORACIC ECHOCARDIOGRAM  October 2014   Moderate-severe AS with mild AI. Peak and mean gradients no significant change: 39 mmHg and 25 mmHg;; mild LV dilation with EF 55-60%. Elevated LAP. Moderate MR. Mild to moderate LA dilation. Mildly elevated RV pressure.  . TRANSTHORACIC ECHOCARDIOGRAM  March 2016   EF 60-65%. Mod-Severe AS, AVA~0.69, Mn-Pk gradient 32 mmHg -- 55 mmHg, severe LA dilation, moderate-severe RA dilation: Progression of disease    Current Outpatient Prescriptions  Medication Sig Dispense Refill  . Acetaminophen 500 MG coapsule Take 1 tablet by mouth every 6 (six) hours as needed for pain.     Marland Kitchen CARTIA XT 240 MG 24 hr capsule TAKE 1 CAPSULE EVERY DAY 90 capsule 2  . dabigatran (PRADAXA) 150 MG CAPS capsule Take 1 capsule (150 mg total) by mouth 2 (two) times daily. 180 capsule 3  . furosemide (LASIX) 40 MG tablet Take 40 mg by mouth daily as needed.    . Metoprolol Tartrate 75 MG TABS Take 75 mg by mouth 2 (two) times daily. 60 tablet 3  . Omega-3 Fatty Acids (FISH OIL) 1200 MG CAPS Take 1,200 mg by mouth 2 (two) times daily.    . potassium chloride SA (K-DUR,KLOR-CON) 20 MEQ tablet Take 1 tablet (20 mEq total) by mouth daily. 30 tablet 3  . rosuvastatin (CRESTOR) 10 MG tablet Take 1 tablet (10 mg total) by mouth daily. 90 tablet 3  . sennosides-docusate sodium (SENOKOT-S) 8.6-50 MG tablet Take 2 tablets by mouth at bedtime.    Marland Kitchen UNABLE TO FIND Apply 1 application topically 2 (two) times daily. Med Name: Nystatin/Zinc/Tac 0.1%   Apply to scrotum, bilateral buttocks, bilateral inner thighs, bilateral groin    . amoxicillin (AMOXIL) 500 MG tablet Take 4 tablets by mouth one hour prior to dental appointment 8 tablet 2   No current facility-administered medications for this visit.     Allergies:   No known allergies   Social History:  The patient  reports that he quit smoking about 41 years ago. He  has never used smokeless tobacco. He reports that he drinks about 8.4 oz of alcohol per week . He reports that he does not use drugs.   Family History:  The patient's  family history includes Hip fracture in his mother.    ROS:  Please see the history of present illness.  Otherwise, review of systems is positive for anxiety, visual disturbance, easy bruising, balance problems.  All other systems are reviewed and negative.    PHYSICAL EXAM: VS:  BP 132/74   Pulse 65   Ht 5\' 10"  (1.778 m)   Wt 171 lb (77.6 kg)   SpO2 97%   BMI 24.54 kg/m  , BMI Body mass index is 24.54 kg/m. GEN: elderly male, in no acute distress  HEENT: normal  Neck: no JVD, no masses.  Cardiac: irregularly irregular with 1/6 systolic murmur at the RUSB               Respiratory:  clear to auscultation bilaterally, normal work of breathing GI: soft, nontender, nondistended, + BS MS: no deformity or atrophy  Ext: no pretibial edema, pedal pulses  2+= bilaterally Skin: warm and dry, no rash Neuro:  Strength and sensation are intact Psych: euthymic mood, full affect  EKG:  EKG is ordered today. The ekg ordered today shows atrial fibrillation 94 bpm, rightward axis  Recent Labs: 06/07/2016: B Natriuretic Peptide 277.1 06/09/2016: TSH 1.319 06/23/2016: Magnesium 1.6 07/03/2016: BUN 7; Creatinine 0.8; Hemoglobin 12.7; Platelets 263; Potassium 4.5; Sodium 136   Lipid Panel     Component Value Date/Time   CHOL 150 12/28/2010 0645   TRIG 45 12/28/2010 0645   HDL 56 12/28/2010 0645   CHOLHDL 2.7 12/28/2010 0645   VLDL 9 12/28/2010 0645   LDLCALC 85 12/28/2010 0645      Wt Readings from Last 3 Encounters:  07/27/16 171 lb (77.6 kg)  07/06/16 165 lb (74.8 kg)  07/04/16 164 lb 10.9 oz (74.7 kg)     Cardiac Studies Reviewed: 2D Echo: Study Conclusions  - Left ventricle: The cavity size was normal. Wall thickness was   increased in a pattern of mild LVH. Systolic function was mildly   reduced. The estimated  ejection fraction was in the range of 45%   to 50%. Wall motion was normal; there were no regional wall   motion abnormalities. The study is not technically sufficient to   allow evaluation of LV diastolic function. - Aortic valve: s/p bioprosthetic stent valve. No obstruction or   paravalvular leak. Mean gradient (S): 10 mm Hg. Peak gradient   (S): 18 mm Hg. Valve area (VTI): 1.28 cm^2. Valve area (Vmax):   1.25 cm^2. Valve area (Vmean): 1.19 cm^2. - Aorta: Aortic root dimension: 39 mm (ED). - Aortic root: The aortic root is dilated. - Mitral valve: Calcified annulus. Mildly thickened leaflets .   There was mild regurgitation. - Right ventricle: The cavity size was mildly dilated. - Right atrium: Severely dilated. - Tricuspid valve: There was mild regurgitation. - Pulmonary arteries: PA peak pressure: 23 mm Hg (S). - Inferior vena cava: The vessel was normal in size. The   respirophasic diameter changes were in the normal range (>= 50%),   consistent with normal central venous pressure.  Impressions:  - Compared to a prior study in 06/2016, the LVEF is slightly better   at 45-50%. The bioprosthetic TAVR valve is stable with no   obstruction or paravalvular leak.   ASSESSMENT AND PLAN: 83 yo male with Stage D aortic stenosis now s/p TAVR, progressing well. He has NYHA functional Class 2 symptoms of chronic combined systolic and diastolic CHF improved from baseline. Echo images are reviewed and LV function has improved, TAVR valve is functioning normally with a mean gradient of 10 mmHg and no paravalvular regurgitation. Medications are reviewed and he will continue on Pradaxa for anticoagulation in the setting of permanent atrial fibrillation. He remains on diltiazem and toprol XL for rate-control. Unable to tolerate an ACE/ARB at this point. He understands that after a prolonged hospitalization, would expect his recovery to be slow and he is encouraged to continue increasing his  activity as tolerated.   Current medicines are reviewed with the patient today.  The patient does not have concerns regarding medicines.  Labs/ tests ordered today include:   Orders Placed This Encounter  Procedures  . EKG 12-Lead    Disposition:   FU Dr Richard Simon as planned. Will arrange 1 year valve clinic FU and echo per protocol.   Deatra James, MD  07/29/2016 9:11 AM    [facility] [place],  [place]  [address] Phone: [phone]; Fax: [phone]

## 2016-07-28 DIAGNOSIS — Z48812 Encounter for surgical aftercare following surgery on the circulatory system: Secondary | ICD-10-CM | POA: Diagnosis not present

## 2016-07-28 DIAGNOSIS — I10 Essential (primary) hypertension: Secondary | ICD-10-CM | POA: Diagnosis not present

## 2016-07-28 DIAGNOSIS — I5042 Chronic combined systolic (congestive) and diastolic (congestive) heart failure: Secondary | ICD-10-CM | POA: Diagnosis not present

## 2016-07-28 DIAGNOSIS — M6281 Muscle weakness (generalized): Secondary | ICD-10-CM | POA: Diagnosis not present

## 2016-08-01 ENCOUNTER — Telehealth: Payer: Self-pay | Admitting: Cardiology

## 2016-08-01 NOTE — Telephone Encounter (Signed)
Attempt to call again-unable to connect to hear patient.

## 2016-08-01 NOTE — Telephone Encounter (Signed)
New message    Pt is calling to find out if he needs to take the pantoprazole sodium that was prescribed to him after his surgery.

## 2016-08-01 NOTE — Telephone Encounter (Signed)
attempt to call x 2-answer but unable to hear patient.

## 2016-08-03 NOTE — Telephone Encounter (Signed)
Left msg advising that this med is probably best handled by primary care, if other concerns may call back.

## 2016-08-03 NOTE — Telephone Encounter (Signed)
New Message    Pt calling regarding his prescription sennosides-docusate sodium (SENOKOT-S) 8.6-50 MG tablet. He received a letter from [facility] stating that it got denied from provider. Requesting call back

## 2016-08-04 DIAGNOSIS — M6281 Muscle weakness (generalized): Secondary | ICD-10-CM | POA: Diagnosis not present

## 2016-08-04 DIAGNOSIS — I5042 Chronic combined systolic (congestive) and diastolic (congestive) heart failure: Secondary | ICD-10-CM | POA: Diagnosis not present

## 2016-08-04 DIAGNOSIS — K5900 Constipation, unspecified: Secondary | ICD-10-CM | POA: Diagnosis not present

## 2016-08-04 DIAGNOSIS — I110 Hypertensive heart disease with heart failure: Secondary | ICD-10-CM | POA: Diagnosis not present

## 2016-08-04 DIAGNOSIS — I2510 Atherosclerotic heart disease of native coronary artery without angina pectoris: Secondary | ICD-10-CM | POA: Diagnosis not present

## 2016-08-04 DIAGNOSIS — Z48812 Encounter for surgical aftercare following surgery on the circulatory system: Secondary | ICD-10-CM | POA: Diagnosis not present

## 2016-08-04 DIAGNOSIS — E785 Hyperlipidemia, unspecified: Secondary | ICD-10-CM | POA: Diagnosis not present

## 2016-08-04 DIAGNOSIS — I482 Chronic atrial fibrillation: Secondary | ICD-10-CM | POA: Diagnosis not present

## 2016-08-04 DIAGNOSIS — K219 Gastro-esophageal reflux disease without esophagitis: Secondary | ICD-10-CM | POA: Diagnosis not present

## 2016-08-11 DIAGNOSIS — K219 Gastro-esophageal reflux disease without esophagitis: Secondary | ICD-10-CM | POA: Diagnosis not present

## 2016-08-11 DIAGNOSIS — Z48812 Encounter for surgical aftercare following surgery on the circulatory system: Secondary | ICD-10-CM | POA: Diagnosis not present

## 2016-08-11 DIAGNOSIS — M6281 Muscle weakness (generalized): Secondary | ICD-10-CM | POA: Diagnosis not present

## 2016-08-11 DIAGNOSIS — E785 Hyperlipidemia, unspecified: Secondary | ICD-10-CM | POA: Diagnosis not present

## 2016-08-11 DIAGNOSIS — K5900 Constipation, unspecified: Secondary | ICD-10-CM | POA: Diagnosis not present

## 2016-08-11 DIAGNOSIS — I482 Chronic atrial fibrillation: Secondary | ICD-10-CM | POA: Diagnosis not present

## 2016-08-11 DIAGNOSIS — I2510 Atherosclerotic heart disease of native coronary artery without angina pectoris: Secondary | ICD-10-CM | POA: Diagnosis not present

## 2016-08-11 DIAGNOSIS — I5042 Chronic combined systolic (congestive) and diastolic (congestive) heart failure: Secondary | ICD-10-CM | POA: Diagnosis not present

## 2016-08-11 DIAGNOSIS — I110 Hypertensive heart disease with heart failure: Secondary | ICD-10-CM | POA: Diagnosis not present

## 2016-08-17 DIAGNOSIS — I10 Essential (primary) hypertension: Secondary | ICD-10-CM | POA: Diagnosis not present

## 2016-08-24 DIAGNOSIS — I2510 Atherosclerotic heart disease of native coronary artery without angina pectoris: Secondary | ICD-10-CM | POA: Diagnosis not present

## 2016-08-24 DIAGNOSIS — Z0000 Encounter for general adult medical examination without abnormal findings: Secondary | ICD-10-CM | POA: Diagnosis not present

## 2016-08-24 DIAGNOSIS — E782 Mixed hyperlipidemia: Secondary | ICD-10-CM | POA: Diagnosis not present

## 2016-08-24 DIAGNOSIS — R310 Gross hematuria: Secondary | ICD-10-CM | POA: Diagnosis not present

## 2016-09-06 ENCOUNTER — Other Ambulatory Visit: Payer: Self-pay | Admitting: Physician Assistant

## 2016-09-07 NOTE — Telephone Encounter (Signed)
This is Dr. Harding's pt. °

## 2016-09-18 NOTE — Addendum Note (Signed)
Addendum  created 09/18/16 1218 by Oleta Mouse, MD   Sign clinical note

## 2016-09-22 ENCOUNTER — Telehealth: Payer: Self-pay | Admitting: Cardiology

## 2016-09-22 MED ORDER — DABIGATRAN ETEXILATE MESYLATE 150 MG PO CAPS: 150.0000 mg | ORAL_CAPSULE | Freq: Two times a day (BID) | ORAL | 0 refills | Status: DC

## 2016-09-22 NOTE — Telephone Encounter (Signed)
Spoke with pt, he is going to run out of medication before he gets his shipment. Samples and savings card placed at the front desk for his pick up

## 2016-09-22 NOTE — Telephone Encounter (Signed)
New Message      Pt would like to speak to you about his Pradaxa , he said it is very important he speaks with you today

## 2016-09-25 ENCOUNTER — Other Ambulatory Visit: Payer: Self-pay | Admitting: Cardiology

## 2016-10-20 ENCOUNTER — Other Ambulatory Visit: Payer: Self-pay

## 2016-10-20 ENCOUNTER — Other Ambulatory Visit: Payer: Self-pay | Admitting: Physician Assistant

## 2016-10-20 NOTE — Patient Outreach (Signed)
[facility]) Care Management  10/20/16  Richard Simon [birth year]  [id number]  RNCM received new High Risk Referral on 10/19/2016. Patient has a past medical history of mitral, aortic and tricuspid valve insufficiency, presence of prosthetic heart valve, chronic combined systolic and diastolic heart failure, chronic a-fib, shortness of breath, orthopnea, corneal dystrophy, osteoarthritis, malnutrition, MI.  Attempted to reach patient for screening without success. Left HIPAA compliant voicemail with RNCM contact information and requested callback.  [place] R. Eleonore Shippee, RN, BSN, [place] Management Coordinator [phone]

## 2016-10-25 ENCOUNTER — Telehealth: Payer: Self-pay | Admitting: Cardiology

## 2016-10-25 MED ORDER — METOPROLOL TARTRATE 25 MG PO TABS: 75.0000 mg | ORAL_TABLET | Freq: Two times a day (BID) | ORAL | 3 refills | Status: DC

## 2016-10-25 NOTE — Telephone Encounter (Signed)
Returned call to patient, reports [facility] has informed him that they are having trouble getting the 75mg  tablets of metoprolol, reports there is a Soil scientist.  Patient wondering if he should continue this medication.  Advised patient that he should continue this medication, I will contact [employer] to see what mg tablets will be covered and/or they have available.    Spoke to [employer], 75mg  tabs are on Scientist, physiological.  States they sent rx out to [facility] ( which patient reports he got filled yesterday).  Unsure when they will get 75mg  tablets in.  States that 25mg  tablets would be covered with patient taking 6 tablet daily.    Spoke to patient and request to send in rx for 25mg  tablets.   New rx sent to pharmacy.  Patient aware and verbalized understanding.

## 2016-10-25 NOTE — Telephone Encounter (Signed)
New message   Pt c/o medication issue:  1. Name of Medication: Metoprolol Tartrate 75 MG TABS  2. How are you currently taking this medication (dosage and times per day)? 75mg   3. Are you having a reaction (difficulty breathing--STAT)? no  4. What is your medication issue? Pt states that he only has enough for 50 days and they have discontinued it. He states he needs some direction on what type of medication will be taking the place of this but he would like a call back

## 2016-11-08 ENCOUNTER — Other Ambulatory Visit: Payer: Self-pay

## 2016-11-08 NOTE — Patient Outreach (Signed)
[facility]) Care Management 11/08/16  Richard Simon [birth year] [id number]  Second attempt to reach patient without success. Left HIPAA compliant voicemail with RNCM contact information.  [place] R. Kylee Nardozzi, RN, BSN, [place] Management Coordinator [phone]

## 2016-11-10 ENCOUNTER — Other Ambulatory Visit: Payer: Self-pay

## 2016-11-10 NOTE — Patient Outreach (Signed)
[facility]) Care Management  11/10/16  Ayaansh Smail 07-May-1929 [id number]  Successful outreach completed with patient. RNCM provided education about the [place] Management program and RNCM role and patient politely declined services stating he does not really need anything right now. He requested that [facility] send him the brochure and contact information in the mail and that if he changes his mind he will reach out.  Plan: RNCM to send [place] Management literature and letter to patient and will perform case closure due to declining services at this time.  [place] R. Babbette Dalesandro, RN, BSN, [place] Management Coordinator [phone]

## 2016-11-15 DIAGNOSIS — H1859 Other hereditary corneal dystrophies: Secondary | ICD-10-CM | POA: Diagnosis not present

## 2016-11-15 DIAGNOSIS — H43393 Other vitreous opacities, bilateral: Secondary | ICD-10-CM | POA: Diagnosis not present

## 2016-11-15 DIAGNOSIS — H2513 Age-related nuclear cataract, bilateral: Secondary | ICD-10-CM | POA: Diagnosis not present

## 2016-11-25 ENCOUNTER — Other Ambulatory Visit: Payer: Self-pay | Admitting: Physician Assistant

## 2016-12-04 ENCOUNTER — Telehealth: Payer: Self-pay | Admitting: Cardiology

## 2016-12-04 NOTE — Telephone Encounter (Signed)
Spoke to patient wanted to know if he needs to take sennosides- docusate ( senokot) . Patient states he is receiving letters from [facility] mail order.  RN  Informed patient  He may use medication as needed and get over the counter , he does not need a prescription.  patient verbalized understanding and states he will call [employer] to stop the order

## 2016-12-04 NOTE — Telephone Encounter (Signed)
New message    Pt states he would like to talk to a nurse about some medications and it is confidential

## 2017-01-03 DIAGNOSIS — R3121 Asymptomatic microscopic hematuria: Secondary | ICD-10-CM | POA: Diagnosis not present

## 2017-03-11 IMAGING — CR DG CHEST 1V PORT
2 series · 2 of 2 positions shown · non-contrast
Comparison: Chest radiograph from one day prior.

CLINICAL DATA: Severe aortic stenosis status post TAVR

EXAM:
PORTABLE CHEST 1 VIEW

[AP (1 of 2)]
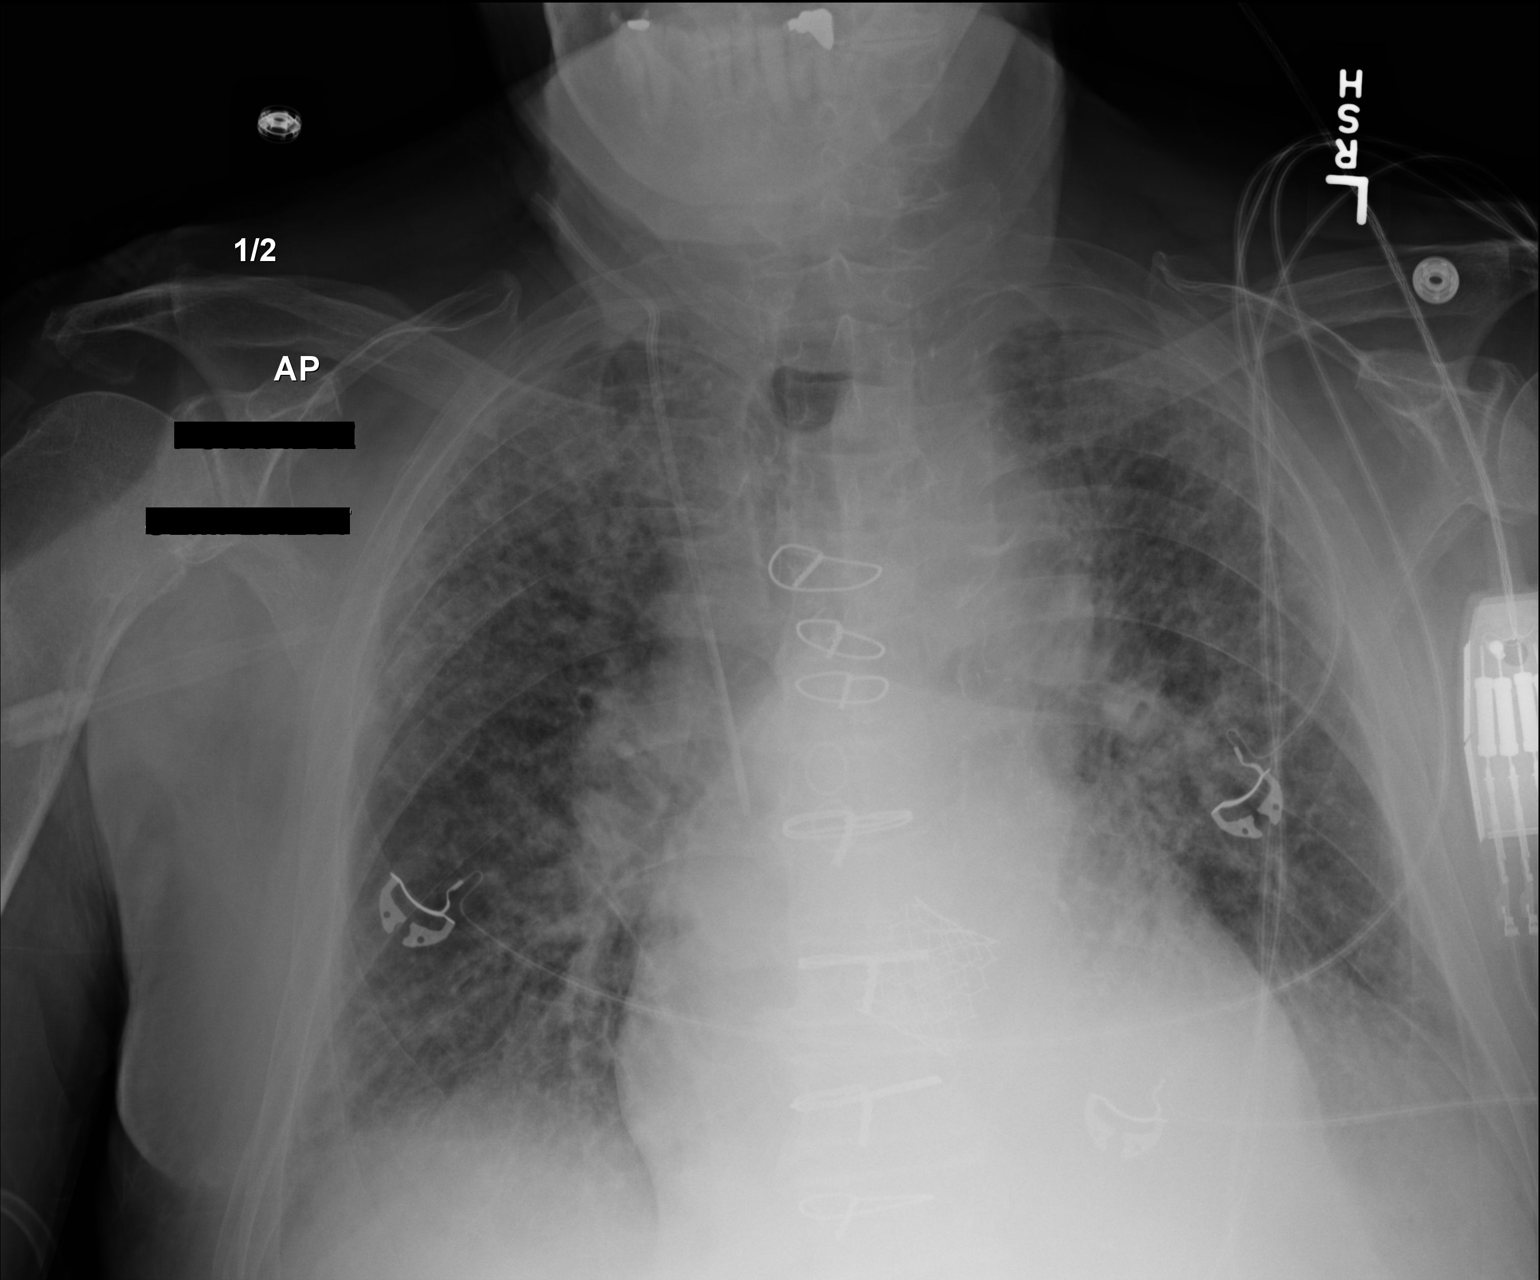

[AP (2 of 2)]
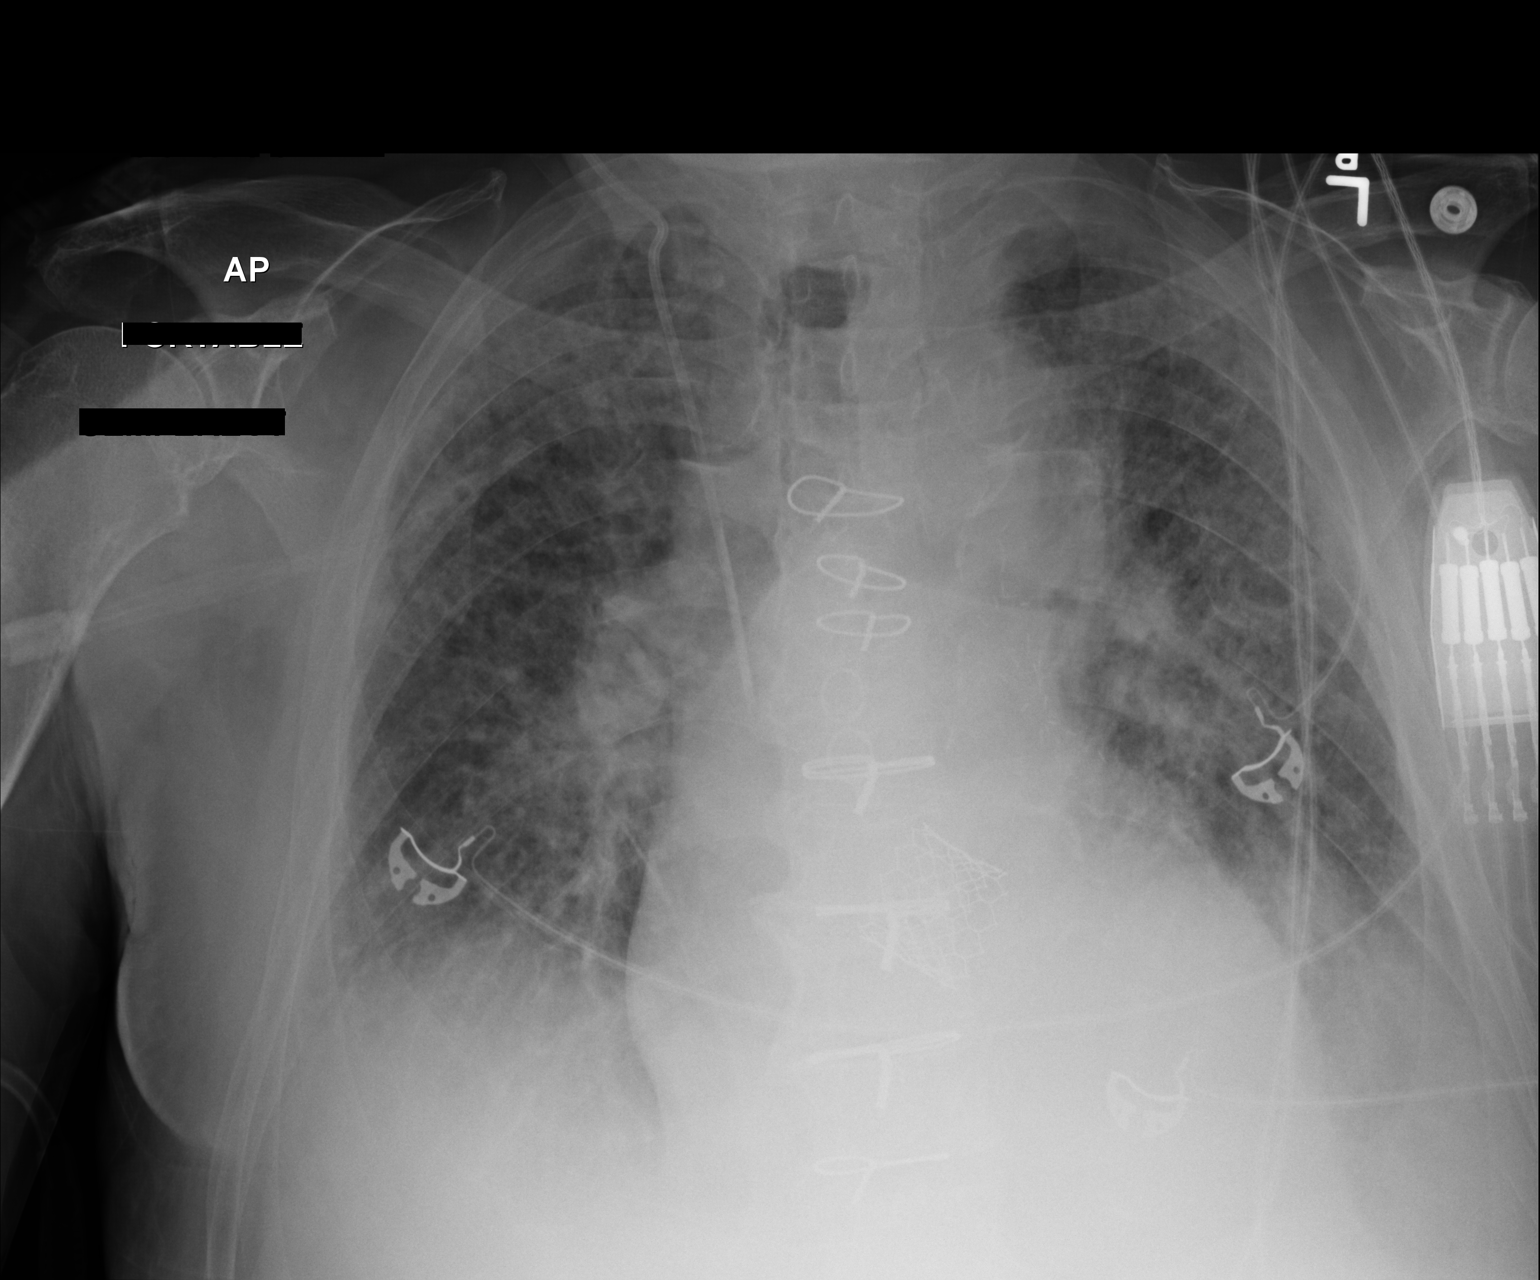

[2 of 2 positions shown; findings below may reference images not displayed]

FINDINGS: Stable configuration of right internal jugular central venous
catheter, median sternotomy wires and aortic valve prosthesis.
Stable cardiomediastinal silhouette with mild cardiomegaly and
aortic atherosclerosis. No pneumothorax. Small bilateral pleural
effusions appear slightly increased bilaterally. Mild-to-moderate
pulmonary edema appears stable.
IMPRESSION: 1. No pneumothorax .
2. Small bilateral pleural effusions, slightly increased
bilaterally.
3. Mild-to-moderate congestive heart failure, stable .
4. Aortic atherosclerosis.

## 2017-04-12 ENCOUNTER — Other Ambulatory Visit: Payer: Self-pay

## 2017-04-12 DIAGNOSIS — Z952 Presence of prosthetic heart valve: Secondary | ICD-10-CM

## 2017-04-12 DIAGNOSIS — I350 Nonrheumatic aortic (valve) stenosis: Secondary | ICD-10-CM

## 2017-04-23 ENCOUNTER — Telehealth: Payer: Self-pay | Admitting: *Deleted

## 2017-04-23 MED ORDER — DABIGATRAN ETEXILATE MESYLATE 150 MG PO CAPS: 150.0000 mg | ORAL_CAPSULE | Freq: Two times a day (BID) | ORAL | 3 refills | Status: DC

## 2017-04-23 NOTE — Telephone Encounter (Signed)
LATE ENTRY  PATIENT CAME BY OFFICE - HE BROUGHT A PATIENT  DRUG ASSISTANCE FORM FOR PRADAXA   INFORMATION WILL BE FILLED OUT AN GIVEN TO PATIENT TO SEND TO COMPANY. PER PATIENT REQUEST

## 2017-04-23 NOTE — Telephone Encounter (Signed)
Patient came to office today requesting Pradaxa samples and to drop off patient assistance forms. Samples of Pradaxa 150 mg #3 lot #[id number] exp 10/20 Samples given to patient, follow up appointment scheduled, and paper given to Cherene Altes RN

## 2017-04-25 NOTE — Telephone Encounter (Signed)
F/U call: Patient calling for update on Pradaxa Drug Assistance Form

## 2017-04-25 NOTE — Telephone Encounter (Signed)
Returned call to patient. He states he dropped off paperwork for Pradaxa patient assistance on Monday Jan 7. He needs his paperwork signed by Dr. Ellyn Hack and would like to be called when the form is signed and ready to pick up. Patient is aware MD & RN are out of the office today. He would like to pick this up soon.

## 2017-04-27 NOTE — Telephone Encounter (Signed)
Spoke to patient - prescription and patient assistance form is ready to be picked up . Patient states he will pick up today

## 2017-04-27 NOTE — Telephone Encounter (Signed)
New message  Pt verbalized that he is returning call for RN  To see if he can pick up his application because he needs the medication form

## 2017-05-08 DIAGNOSIS — H1859 Other hereditary corneal dystrophies: Secondary | ICD-10-CM | POA: Diagnosis not present

## 2017-05-08 DIAGNOSIS — H2513 Age-related nuclear cataract, bilateral: Secondary | ICD-10-CM | POA: Diagnosis not present

## 2017-05-08 DIAGNOSIS — H5703 Miosis: Secondary | ICD-10-CM | POA: Diagnosis not present

## 2017-05-14 DIAGNOSIS — H2511 Age-related nuclear cataract, right eye: Secondary | ICD-10-CM | POA: Diagnosis not present

## 2017-05-18 HISTORY — PX: TRANSTHORACIC ECHOCARDIOGRAM: SHX275

## 2017-05-22 ENCOUNTER — Other Ambulatory Visit: Payer: Self-pay | Admitting: Cardiology

## 2017-05-23 ENCOUNTER — Encounter: Payer: Self-pay | Admitting: Physician Assistant

## 2017-05-28 NOTE — Progress Notes (Signed)
[facility]                                       Cardiology Office Note    Date:  05/30/2017   ID:  Freddi Starr, DOB 07/12/1929, MRN [id number]  PCP:  Merrilee Seashore, MD  Cardiologist: Dr. Ellyn Hack / Dr. Burt Knack & Dr. Cyndia Bent (TAVR)   CC: 1 year s/p TAVR   History of Present Illness:  Richard Simon is a 84 y.o. male with a history of permanent atrial fibrillation on Pradaxa, CAD s/p CABG (LIMA to LAD, SVG-Diag, seqSVG-OM1-OM2) in 2004, HTN, HLD, chronic diastolic CHF and severe AS s/p TAVR.   He has a long history of ischemic heart disease and aortic stenosis. He had been followed with moderate to severe aortic stenosis because of asymptomatic clinical status. He then was hospitalized in 05/2016 with acute systolic heart failure and had a prolonged hospitalization (2/21-3/12). He was found to have critical aortic stenosis and was treated with TAVR using a 29 mm sapien 3 heart valve 06/22/2016 via a percutaneous transfemoral approach. He had no postoperative complications.  His 30 day post operative echo 07/27/16 showed EF 45-50% with normally functioning TAVR valve with mean/peak gradient 10/18 mmHg and no PVL.   Today he presents to clinic for follow up.  He is overall doing well. He often feels "foggy headed" and dizzy, which comes and goes and lasts about 10 minutes at at time. He worries that it is related to his medications. He occasionally gets SOB when walking up inclines. He otherwise has no issues with day to day activities. No CP. No LE edema, orthopnea or PND. No blood in stool or urine. No palpitations.    Past Medical History:  Diagnosis Date  . Atrial fibrillation, permanent ([place])    Rate controlled. Anticoagulation with Pradaxa.  . CAD, multiple vessel 2004   S/P 4-vessel CABG in 2004: Pre-TAVR CATH: Occluded OM1 and distal circumflex PRIOR TO GRAFT; 70&80% mid LAD stenosis prior to LIMA graft, 95% D1 stenosis prior to  graft. 4/4 Grafts patent (LIMA-LAD, SVG-DIAG, SVG-OM-OM).    . Dyslipidemia, goal LDL below 70   . Hypertension     very well controlled  . S/P CABG x 4 2004   Post CABG  . 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 Burt Knack: Oletta Lamas Sapient 3 29 mm biprosthetic valve  . Severe aortic stenosis by prior echocardiogram - s/pTAVR 01/2013; 06/2014   05/2016: Presented with progressively worsening aortic stenosis and referred for TAVR. - Underwent TAVR March 2018    Past Surgical History:  Procedure Laterality Date  . CARDIAC CATHETERIZATION  12/11/2002   Recommendation-CABG; 80% mid LAD, 70% distal LAD. Mid circumflex 95%, OM1 95%. AV groove circumflex 95%. Proximal RCA 100%  . CORONARY ARTERY BYPASS GRAFT  12/15/2002   LIMA-LAD, SVG-diagonal, SVG-OM1-OM2 sequential  [facility] MYOVIEW  December 2012   Fixed basal inferior diaphragmatic attenuation versusinfarct, no ischemia  . MULTIPLE EXTRACTIONS WITH ALVEOLOPLASTY N/A 06/20/2016   Procedure: EXTRACTION of #7, 12, 13, and 15 WITH ALVEOLOPLASTY and gross debridement of teeth;  Surgeon: Lenn Cal, DDS;  Location: [facility];  Service: Oral Surgery;  Laterality: N/A;  . RIGHT HEART CATH AND CORONARY/GRAFT ANGIOGRAPHY N/A 06/13/2016   Procedure: Right Heart Cath and Coronary/Graft Angiography;  Surgeon: Leonie Man, MD;  Location:  [facility];  Service: Cardiovascular: Occluded OM1 and distal circumflex PRIOR TO GRAFT; 70&80% mid LAD stenosis prior to LIMA graft, 95% D1 stenosis prior to graft. 4/4 Grafts patent (LIMA-LAD, SVG-DIAG, SVG-OM-OM).   Mild 2 pulmonary hypertension: PCWP 25 mmHg. PA peak/mean 48/23/33 mmH  . TEE WITHOUT CARDIOVERSION N/A 06/22/2016   Procedure: TRANSESOPHAGEAL ECHOCARDIOGRAM (TEE);  Surgeon: Sherren Mocha, MD;  Location: [facility];  Service: Open Heart Surgery;  Laterality: N/A;  . TRANSCATHETER AORTIC VALVE REPLACEMENT, TRANSFEMORAL N/A 06/22/2016   Procedure: TRANSCATHETER  AORTIC VALVE REPLACEMENT, TRANSFEMORAL;  Surgeon: Sherren Mocha, MD;  Location: [place];  Service: Open Heart Surgery: Oletta Lamas Sapient 3 29 mm biprosthetic walve.  . TRANSTHORACIC ECHOCARDIOGRAM  06/23/2016   Post TAVR:  EF 40-45% with diffuse hypokinesis. High LV filling pressures. Well-seated Edwards's Sapien 3 TAVR bioprosthesis.  - AVA ~1.5. PA pressures now estimated 43 mmHg.  Marland Kitchen TRANSTHORACIC ECHOCARDIOGRAM  October 2014   Moderate-severe AS with mild AI. Peak and mean gradients no significant change: 39 mmHg and 25 mmHg;; mild LV dilation with EF 55-60%. Elevated LAP. Moderate MR. Mild to moderate LA dilation. Mildly elevated RV pressure.  . TRANSTHORACIC ECHOCARDIOGRAM  March 2016   EF 60-65%. Mod-Severe AS, AVA~0.69, Mn-Pk gradient 32 mmHg -- 55 mmHg, severe LA dilation, moderate-severe RA dilation: Progression of disease    Current Medications: Outpatient Medications Prior to Visit  Medication Sig Dispense Refill  . Acetaminophen 500 MG coapsule Take 1 tablet by mouth every 6 (six) hours as needed for pain.     Marland Kitchen CARTIA XT 240 MG 24 hr capsule TAKE 1 CAPSULE EVERY DAY 90 capsule 3  . dabigatran (PRADAXA) 150 MG CAPS capsule Take 1 capsule (150 mg total) by mouth 2 (two) times daily. 180 capsule 3  . furosemide (LASIX) 40 MG tablet TAKE 1 TABLET EVERY DAY 90 tablet 3  . Omega-3 Fatty Acids (FISH OIL) 1200 MG CAPS Take 1,200 mg by mouth 2 (two) times daily.    . Potassium Chloride ER 20 MEQ TBCR TAKE 1 TABLET EVERY DAY 90 tablet 3  . rosuvastatin (CRESTOR) 10 MG tablet TAKE 1 TABLET (10 MG TOTAL) BY MOUTH DAILY. 90 tablet 3  . sennosides-docusate sodium (SENOKOT-S) 8.6-50 MG tablet Take 2 tablets by mouth at bedtime.    Marland Kitchen UNABLE TO FIND Apply 1 application topically 2 (two) times daily. Med Name: Nystatin/Zinc/Tac 0.1%   Apply to scrotum, bilateral buttocks, bilateral inner thighs, bilateral groin    . amoxicillin (AMOXIL) 500 MG tablet Take 4 tablets by mouth one hour prior to dental  appointment 8 tablet 2  . metoprolol tartrate (LOPRESSOR) 25 MG tablet Take 3 tablets (75 mg total) by mouth 2 (two) times daily. 540 tablet 3   No facility-administered medications prior to visit.      Allergies:   No known allergies   Social History   Socioeconomic History  . Marital status: Married    Spouse name: None  . Number of children: None  . Years of education: None  . Highest education level: None  Social Needs  . Financial resource strain: None  . Food insecurity - worry: None  . Food insecurity - inability: None  . Transportation needs - medical: None  . Transportation needs - non-medical: None  Occupational History  . None  Tobacco Use  . Smoking status: Former Smoker    Last attempt to quit: 02/12/1975    Years since quitting: 42.3  . Smokeless tobacco: Never Used  Substance  and Sexual Activity  . Alcohol use: Yes    Alcohol/week: 8.4 oz    Types: 14 Glasses of wine per week  . Drug use: No  . Sexual activity: None  Other Topics Concern  . None  Social History Narrative   Married. Father of 80, grandfather 3, great-grandfather 3.   Very active, but without routine exercise. Does yard work and intermittent walking, but nothing routine.   Does not smoke. Occasional alcohol.     Family History:  The patient's family history includes Hip fracture in his mother.      ROS:   Please see the history of present illness.    ROS All other systems reviewed and are negative.   PHYSICAL EXAM:   VS:  BP 132/70   Pulse 74   Ht 5\' 11"  (1.803 m)   Wt 196 lb (88.9 kg)   BMI 27.34 kg/m    GEN: Well nourished, well developed, in no acute distress  HEENT: normal  Neck: no JVD, carotid bruits, or masses Cardiac: irreg irreg. no murmurs, rubs, or gallops,no edema  Respiratory:  clear to auscultation bilaterally, normal work of breathing GI: soft, nontender, nondistended, + BS MS: no deformity or atrophy  Skin: warm and dry, no rash Neuro:  Alert and Oriented x  3, Strength and sensation are intact Psych: euthymic mood, full affect   Wt Readings from Last 3 Encounters:  05/30/17 196 lb (88.9 kg)  07/27/16 171 lb (77.6 kg)  07/06/16 165 lb (74.8 kg)      Studies/Labs Reviewed:   EKG:  EKG is NOT ordered today.   Recent Labs: 06/07/2016: B Natriuretic Peptide 277.1 06/09/2016: TSH 1.319 06/23/2016: Magnesium 1.6 07/03/2016: BUN 7; Creatinine 0.8; Hemoglobin 12.7; Platelets 263; Potassium 4.5; Sodium 136   Lipid Panel    Component Value Date/Time   CHOL 150 12/28/2010 0645   TRIG 45 12/28/2010 0645   HDL 56 12/28/2010 0645   CHOLHDL 2.7 12/28/2010 0645   VLDL 9 12/28/2010 0645   LDLCALC 85 12/28/2010 0645    Additional studies/ records that were reviewed today include:  2D Echo: 07/27/16 Study Conclusions - Left ventricle: The cavity size was normal. Wall thickness was increased in a pattern of mild LVH. Systolic function was mildly reduced. The estimated ejection fraction was in the range of 45% to 50%. Wall motion was normal; there were no regional wall motion abnormalities. The study is not technically sufficient to allow evaluation of LV diastolic function. - Aortic valve: s/p bioprosthetic stent valve. No obstruction or paravalvular leak. Mean gradient (S): 10 mm Hg. Peak gradient (S): 18 mm Hg. Valve area (VTI): 1.28 cm^2. Valve area (Vmax): 1.25 cm^2. Valve area (Vmean): 1.19 cm^2. - Aorta: Aortic root dimension: 39 mm (ED). - Aortic root: The aortic root is dilated. - Mitral valve: Calcified annulus. Mildly thickened leaflets . There was mild regurgitation. - Right ventricle: The cavity size was mildly dilated. - Right atrium: Severely dilated. - Tricuspid valve: There was mild regurgitation. - Pulmonary arteries: PA peak pressure: 23 mm Hg (S). - Inferior vena cava: The vessel was normal in size. The respirophasic diameter changes were in the normal range (>= 50%), consistent with normal central  venous pressure. Impressions: - Compared to a prior study in 06/2016, the LVEF is slightly better at 45-50%. The bioprosthetic TAVR valve is stable with no obstruction or paravalvular leak.   2D ECHO 05/30/17 (1 year s/p TAVR) Study Conclusions - Left ventricle: The cavity size was normal.  Wall thickness was   increased in a pattern of mild LVH. The estimated ejection   fraction was 55%. The study is not technically sufficient to   allow evaluation of LV diastolic function. - Aortic valve: Post TAVR with no significant peri valvular   regurgitation and stable gradients - Mitral valve: Moderately calcified, moderately fibrotic,   moderately thickened annulus. Moderately thickened, mildly   calcified leaflets . - Left atrium: The atrium was moderately dilated. - Right ventricle: The cavity size was moderately dilated. - Right atrium: The atrium was moderately dilated. - Atrial septum: No defect or patent foramen ovale was identified.   ASSESSMENT & PLAN:   Severe AS s/p TAVR: he is doing well. He has NYHA class I symptoms. 2D ECHO shows a normally functioning TAVR valve with no PVL and stable gradients; mean gradient 8 mm Hg. He has Amoxil for SBE prophylaxis.    Permanent atrial fibrillation: rate well controlled. Continue pradaxa, metoprolol and Cartia XT.   HTN: BP well controlled today. Continue current regimen   CAD: stable. Continue medical therapy.   Chronic diastolic CHF: appears euvolemic. Continue current lasix dosing.   Dizziness/brain fog: does not sound cardiac, but I suppose he could be having episodes of bradycardia (on large doses of AV nodal blocking agents). I offered a heart monitor but he declines this at this time. He thinks he might need a brain MRI. I also offered a neurology referral but he has also declined this as well. He will discuss at his office visit with Dr. Ellyn Hack next month.   Medication Adjustments/Labs and Tests Ordered: Current medicines  are reviewed at length with the patient today.  Concerns regarding medicines are outlined above.  Medication changes, Labs and Tests ordered today are listed in the Patient Instructions below. Patient Instructions  Medication Instructions:  1) TAKE AMOXIL 2g (four 500mg  tablets) one hour prior to dental procedures.  Labwork: None  Testing/Procedures: None  Follow-Up: Your provider recommends that you schedule a follow-up appointment AS NEEDED with the Structural Heart Team!  Any Other Special Instructions Will Be Listed Below (If Applicable).     If you need a refill on your cardiac medications before your next appointment, please call your pharmacy.      Signed, Angelena Form, PA-C  05/30/2017 4:26 PM    [facility] [address], [place],   [address] Phone: [phone]; Fax: [phone]

## 2017-05-30 ENCOUNTER — Encounter (INDEPENDENT_AMBULATORY_CARE_PROVIDER_SITE_OTHER): Payer: Self-pay

## 2017-05-30 ENCOUNTER — Ambulatory Visit: Payer: Medicare HMO | Admitting: Physician Assistant

## 2017-05-30 ENCOUNTER — Other Ambulatory Visit: Payer: Self-pay

## 2017-05-30 ENCOUNTER — Encounter: Payer: Self-pay | Admitting: Physician Assistant

## 2017-05-30 ENCOUNTER — Ambulatory Visit (HOSPITAL_COMMUNITY): Payer: Medicare HMO | Attending: Cardiovascular Disease

## 2017-05-30 VITALS — BP 132/70 | HR 74 | Ht 71.0 in | Wt 196.0 lb

## 2017-05-30 DIAGNOSIS — Z952 Presence of prosthetic heart valve: Secondary | ICD-10-CM | POA: Insufficient documentation

## 2017-05-30 DIAGNOSIS — Z953 Presence of xenogenic heart valve: Secondary | ICD-10-CM | POA: Diagnosis not present

## 2017-05-30 DIAGNOSIS — I2510 Atherosclerotic heart disease of native coronary artery without angina pectoris: Secondary | ICD-10-CM

## 2017-05-30 DIAGNOSIS — Z87891 Personal history of nicotine dependence: Secondary | ICD-10-CM | POA: Insufficient documentation

## 2017-05-30 DIAGNOSIS — I110 Hypertensive heart disease with heart failure: Secondary | ICD-10-CM | POA: Insufficient documentation

## 2017-05-30 DIAGNOSIS — I350 Nonrheumatic aortic (valve) stenosis: Secondary | ICD-10-CM

## 2017-05-30 DIAGNOSIS — E785 Hyperlipidemia, unspecified: Secondary | ICD-10-CM | POA: Diagnosis not present

## 2017-05-30 DIAGNOSIS — R42 Dizziness and giddiness: Secondary | ICD-10-CM

## 2017-05-30 DIAGNOSIS — I509 Heart failure, unspecified: Secondary | ICD-10-CM | POA: Diagnosis not present

## 2017-05-30 DIAGNOSIS — I080 Rheumatic disorders of both mitral and aortic valves: Secondary | ICD-10-CM | POA: Insufficient documentation

## 2017-05-30 DIAGNOSIS — I10 Essential (primary) hypertension: Secondary | ICD-10-CM

## 2017-05-30 DIAGNOSIS — I4891 Unspecified atrial fibrillation: Secondary | ICD-10-CM | POA: Diagnosis not present

## 2017-05-30 DIAGNOSIS — I4821 Permanent atrial fibrillation: Secondary | ICD-10-CM

## 2017-05-30 DIAGNOSIS — I482 Chronic atrial fibrillation: Secondary | ICD-10-CM | POA: Diagnosis not present

## 2017-05-30 MED ORDER — AMOXICILLIN 500 MG PO TABS: ORAL_TABLET | ORAL | 4 refills | Status: DC

## 2017-05-30 NOTE — Patient Instructions (Signed)
Medication Instructions:  1) TAKE AMOXIL 2g (four 500mg  tablets) one hour prior to dental procedures.  Labwork: None  Testing/Procedures: None  Follow-Up: Your provider recommends that you schedule a follow-up appointment AS NEEDED with the Structural Heart Team!  Any Other Special Instructions Will Be Listed Below (If Applicable).     If you need a refill on your cardiac medications before your next appointment, please call your pharmacy.

## 2017-06-26 ENCOUNTER — Encounter: Payer: Self-pay | Admitting: Cardiology

## 2017-06-26 ENCOUNTER — Ambulatory Visit: Payer: Medicare HMO | Admitting: Cardiology

## 2017-06-26 VITALS — BP 125/78 | HR 79 | Ht 70.5 in | Wt 196.6 lb

## 2017-06-26 DIAGNOSIS — I5032 Chronic diastolic (congestive) heart failure: Secondary | ICD-10-CM | POA: Diagnosis not present

## 2017-06-26 DIAGNOSIS — R5383 Other fatigue: Secondary | ICD-10-CM | POA: Diagnosis not present

## 2017-06-26 DIAGNOSIS — Z953 Presence of xenogenic heart valve: Secondary | ICD-10-CM

## 2017-06-26 DIAGNOSIS — I482 Chronic atrial fibrillation: Secondary | ICD-10-CM | POA: Diagnosis not present

## 2017-06-26 DIAGNOSIS — I10 Essential (primary) hypertension: Secondary | ICD-10-CM | POA: Diagnosis not present

## 2017-06-26 DIAGNOSIS — I350 Nonrheumatic aortic (valve) stenosis: Secondary | ICD-10-CM | POA: Diagnosis not present

## 2017-06-26 DIAGNOSIS — I4821 Permanent atrial fibrillation: Secondary | ICD-10-CM

## 2017-06-26 DIAGNOSIS — E785 Hyperlipidemia, unspecified: Secondary | ICD-10-CM

## 2017-06-26 DIAGNOSIS — I2510 Atherosclerotic heart disease of native coronary artery without angina pectoris: Secondary | ICD-10-CM

## 2017-06-26 MED ORDER — METOPROLOL TARTRATE 25 MG PO TABS: 50.0000 mg | ORAL_TABLET | Freq: Two times a day (BID) | ORAL | 3 refills | Status: DC

## 2017-06-26 NOTE — Progress Notes (Signed)
PCP: Merrilee Seashore, MD  Clinic Note: Chief Complaint  Patient presents with  . Follow-up    Just low energy  . Cardiac Valve Problem    s/p TAVR  . Coronary Artery Disease  . Atrial Fibrillation    HPI: Richard Simon is a 84 y.o. male with a PMH below who presents today for annual follow-up. 84 y.o. male with a history of permanent atrial fibrillation on Pradaxa, Long-standing Ischemic Heart Disease. CAD s/p CABG (LIMA to LAD, SVG-Diag, SeqSVG-OM1-OM2) in 2004, HTN, HLD, chronic diastolic CHF and severe/critical AS s/p TAVR in February-March 2018 (Drs. Richard Simon) . --   I last saw him in March 2018 following TAVR.--He was feeling quite well.  Recovering from his hospitalization.  Was looking forward to the rehab.  Richard Simon was last seen on May 30, 2017 by Richard Ligas, PA-C from the [facility] --> was doing well.  Had some foggy headed dizzy sensations every now and then lasting about 10 minutes of time but otherwise doing fine.  Occasional exertional dyspnea with walking up incline.  Otherwise doing well. -->   Recent Hospitalizations: None  Studies Personally Reviewed - (if available, images/films reviewed: From Epic Chart or Care Everywhere)  Transthoracic Echo May 30, 2017: Normal LV size.  Mild LVH.  EF 55% with no regional wall motion normality.  Cannot assess diastolic function.  Post TAVR with no significant perivalvular regurgitation and stable gradients.  Moderate MAC.  Mildly fibrotic/calcified (no comment of MR).  Moderate LA and RA dilation.  Moderate RV dilation but with normal function.  Interval History: Richard Simon presents here today overall feeling okay.  He he is a little bit frustrated about co-pay issues etc.  But for the most part really does not have any cardiac complaints.  He just has a little bit of issue with fatigue.  He just does not have the get up and go never really improved.  A little bit of exertional dyspnea, but not  significant.  No PND, orthopnea with trivial edema. He is under a lot of stress because his wife is now in a skilled nursing facility and he is having a go back and forth to see her. He denies any sensation of rapid irregular heartbeats palpitations.  He does note that compared to pre- TAVR he has significantly improved exertional stamina. He denies any chest tightness pressure with rest or exertion. No syncope/near syncope or TIA/amaurosis fugax  No melena, hematochezia, hematuria, or epstaxis. No claudication.  ROS: A comprehensive was performed. Review of Systems  Constitutional: Positive for malaise/fatigue (This does have a lot of get up and go). Negative for chills and fever.  HENT: Negative for congestion and nosebleeds.   Respiratory: Negative for cough, shortness of breath and wheezing.   Gastrointestinal: Negative for abdominal pain, blood in stool and melena.  Genitourinary: Negative.  Negative for hematuria.  Musculoskeletal: Positive for joint pain. Negative for myalgias.  Neurological: Positive for dizziness.  Psychiatric/Behavioral: Negative for memory loss. The patient is not nervous/anxious and does not have insomnia.   All other systems reviewed and are negative.   I have reviewed and (if needed) personally updated the patient's problem list, medications, allergies, past medical and surgical history, social and family history.   Past Medical History:  Diagnosis Date  . Atrial fibrillation, permanent ([place])    Rate controlled. Anticoagulation with Pradaxa.  . CAD, multiple vessel 2004   S/P 4-vessel CABG in 2004: Pre-TAVR CATH: Occluded OM1 and distal circumflex  PRIOR TO GRAFT; 70&80% mid LAD stenosis prior to LIMA graft, 95% D1 stenosis prior to graft. 4/4 Grafts patent (LIMA-LAD, SVG-DIAG, SVG-OM-OM).    . Dyslipidemia, goal LDL below 70   . Hypertension     very well controlled  . S/P CABG x 4 2004   Post CABG  . S/p TAVR (transcatheter aortic valve replacement),  bioprosthetic 06/22/2016   Dr. Burt Knack: Oletta Lamas Sapient 3 29 mm biprosthetic valve  . Severe aortic stenosis by prior echocardiogram - s/pTAVR 01/2013; 06/2014   05/2016: Presented with progressively worsening aortic stenosis and referred for TAVR. - Underwent TAVR March 2018    Past Surgical History:  Procedure Laterality Date  . CARDIAC CATHETERIZATION  12/11/2002   Recommendation-CABG; 80% mid LAD, 70% distal LAD. Mid circumflex 95%, OM1 95%. AV groove circumflex 95%. Proximal RCA 100%  . CORONARY ARTERY BYPASS GRAFT  12/15/2002   LIMA-LAD, SVG-diagonal, SVG-OM1-OM2 sequential  Carlton Adam MYOVIEW  December 2012   Fixed basal inferior diaphragmatic attenuation versusinfarct, no ischemia  . MULTIPLE EXTRACTIONS WITH ALVEOLOPLASTY N/A 06/20/2016   Procedure: EXTRACTION of #7, 12, 13, and 15 WITH ALVEOLOPLASTY and gross debridement of teeth;  Surgeon: Lenn Cal, DDS;  Location: [facility];  Service: Oral Surgery;  Laterality: N/A;  . RIGHT HEART CATH AND CORONARY/GRAFT ANGIOGRAPHY N/A 06/13/2016   Procedure: Right Heart Cath and Coronary/Graft Angiography;  Surgeon: Leonie Man, MD;  Location: [facility];  Service: Cardiovascular: Occluded OM1 and distal circumflex PRIOR TO GRAFT; 70&80% mid LAD stenosis prior to LIMA graft, 95% D1 stenosis prior to graft. 4/4 Grafts patent (LIMA-LAD, SVG-DIAG, SVG-OM-OM).   Mild 2 pulmonary hypertension: PCWP 25 mmHg. PA peak/mean 48/23/33 mmH  . TEE WITHOUT CARDIOVERSION N/A 06/22/2016   Procedure: TRANSESOPHAGEAL ECHOCARDIOGRAM (TEE);  Surgeon: Sherren Mocha, MD;  Location: [facility];  Service: Open Heart Surgery;  Laterality: N/A;  . TRANSCATHETER AORTIC VALVE REPLACEMENT, TRANSFEMORAL N/A 06/22/2016   Procedure: TRANSCATHETER AORTIC VALVE REPLACEMENT, TRANSFEMORAL;  Surgeon: Sherren Mocha, MD;  Location: [facility];  Service: Open Heart Surgery: Oletta Lamas Sapient 3 29 mm biprosthetic walve.  . TRANSTHORACIC ECHOCARDIOGRAM  06/2016   Post TAVR:  EF 40-45% with  diffuse hypokinesis. High LV filling pressures. Well-seated Edwards's Sapien 3 TAVR bioprosthesis.  - AVA ~1.5. PA pressures now estimated 43 mmHg.  Marland Kitchen TRANSTHORACIC ECHOCARDIOGRAM  05/2017   Normal LV size.  Mild LVH.  EF 55% with no regional wall motion normality.  Cannot assess diastolic function.  Post TAVR with no significant perivalvular regurgitation and stable gradients.  Moderate MAC.  Mildly fibrotic/calcified (no comment of MR).  Moderate LA and RA dilation.  Moderate RV dilation but with normal function.  . TRANSTHORACIC ECHOCARDIOGRAM  06/2014   EF 60-65%. Mod-Severe AS, AVA~0.69, Mn-Pk gradient 32 mmHg -- 55 mmHg, severe LA dilation, moderate-severe RA dilation: Progression of disease     Diagnostic Diagram 06/13/2016 - pre-TAVR: 4/4 widely patent grafts with severe native CAD (CTO of mid Cx and OM1, 70 and 80% stenosis of the mid LAD and 85% stenosis of D2. Mild secondary pulmonary hypertension with wedge pressure 25 mmHg and PA pressure 48/23/33 mmHg     Current Meds  Medication Sig  . Acetaminophen 500 MG coapsule Take 1 tablet by mouth every 6 (six) hours as needed for pain.   Marland Kitchen amoxicillin (AMOXIL) 500 MG tablet Take 4 tablets by mouth one hour prior to dental appointment  . CARTIA XT 240 MG 24 hr capsule TAKE 1 CAPSULE EVERY DAY  .  dabigatran (PRADAXA) 150 MG CAPS capsule Take 1 capsule (150 mg total) by mouth 2 (two) times daily.  . furosemide (LASIX) 40 MG tablet TAKE 1 TABLET EVERY DAY  . metoprolol tartrate (LOPRESSOR) 25 MG tablet Take 2 tablets (50 mg total) by mouth 2 (two) times daily. MAY TAKE AN EXTRA 25 MG IF NEEDED FOR PALPATION  . Omega-3 Fatty Acids (FISH OIL) 1200 MG CAPS Take 1,200 mg by mouth 2 (two) times daily.  . Potassium Chloride ER 20 MEQ TBCR TAKE 1 TABLET EVERY DAY  . rosuvastatin (CRESTOR) 10 MG tablet TAKE 1 TABLET (10 MG TOTAL) BY MOUTH DAILY.  Marland Kitchen sennosides-docusate sodium (SENOKOT-S) 8.6-50 MG tablet Take 2 tablets by mouth at bedtime.  Marland Kitchen UNABLE  TO FIND Apply 1 application topically 2 (two) times daily. Med Name: Nystatin/Zinc/Tac 0.1%   Apply to scrotum, bilateral buttocks, bilateral inner thighs, bilateral groin  . [DISCONTINUED] metoprolol tartrate (LOPRESSOR) 25 MG tablet Take 3 tablets (75 mg total) by mouth 2 (two) times daily.    Allergies  Allergen Reactions  . No Known Allergies     Social History   Tobacco Use  . Smoking status: Former Smoker    Last attempt to quit: 02/12/1975    Years since quitting: 42.4  . Smokeless tobacco: Never Used  Substance Use Topics  . Alcohol use: Yes    Alcohol/week: 8.4 oz    Types: 14 Glasses of wine per week  . Drug use: No   Social History   Social History Narrative   Married. Father of 22, grandfather 57, great-grandfather 3.   Very active, but without routine exercise. Does yard work and intermittent walking, but nothing routine.   Does not smoke. Occasional alcohol.    family history includes Hip fracture in his mother.  Wt Readings from Last 3 Encounters:  06/26/17 196 lb 9.6 oz (89.2 kg)  05/30/17 196 lb (88.9 kg)  07/27/16 171 lb (77.6 kg)    PHYSICAL EXAM BP 125/78   Pulse 79   Ht 5' 10.5" (1.791 m)   Wt 196 lb 9.6 oz (89.2 kg)   BMI 27.81 kg/m  Physical Exam  Constitutional: He is oriented to person, place, and time. He appears well-developed and well-nourished. No distress.  He has regained the weight that he lost during his hospital stay.  Now healthy-appearing.  Well-groomed.  HENT:  Head: Normocephalic and atraumatic.  Eyes: EOM are normal.  Neck: Normal range of motion. Neck supple. No hepatojugular reflux and no JVD present. Carotid bruit is not present.  Cardiovascular: Normal rate and intact distal pulses. An irregularly irregular rhythm present.  No extrasystoles are present. PMI is not displaced.  Murmur heard. High-pitched blowing holosystolic murmur is present with a grade of 1/6 at the apex. No systolic ejection or diastolic  murmur Pulmonary/Chest: Effort normal and breath sounds normal. No respiratory distress. He has no wheezes. He has no rales.  Abdominal: Soft. Bowel sounds are normal. He exhibits no distension. There is no tenderness. There is no rebound.  Musculoskeletal: Normal range of motion. He exhibits no edema.  Neurological: He is alert and oriented to person, place, and time.  Psychiatric: He has a normal mood and affect. His behavior is normal. Judgment and thought content normal.  Nursing note and vitals reviewed.   Adult ECG Report  Rate: 63 ;  Rhythm: atrial fibrillation and Controlled ventricular rate.  Normal axis, intervals and durations.;   Narrative Interpretation: Stable EKG   Other studies Reviewed: Additional  studies/ records that were reviewed today include:  Recent Labs: followed by PCP (last check 1 yr ago)    ASSESSMENT / PLAN: Problem List Items Addressed This Visit    Severe aortic stenosis (Chronic)    Doing well S/P TAVR --follow-up echo looks great.      Relevant Medications   metoprolol tartrate (LOPRESSOR) 25 MG tablet   S/p TAVR (transcatheter aortic valve replacement), bioprosthetic: Dr, Burt Knack (Chronic)    1 year out echo looks wonderful.  No suggestion of aortic stenosis.  Probably can follow every couple years.      Fatigue due to treatment (Chronic)    He is doing better overall with a reduced beta-blocker dose in the evening, but I will reduce even further to 63m twice daily.  Need to watch for bradycardia, but I think he is noticing some fatigue from the beta-blocker dose.      Essential hypertension (Chronic)    Well-controlled blood pressure on current medications.  He is on beta-blocker and calcium channel blocker.      Relevant Medications   metoprolol tartrate (LOPRESSOR) 25 MG tablet   Dyslipidemia, goal LDL below 70 (Chronic)    No  current labs available.  He remains on stable dose of Crestor along with fish oil. Target LDL is less than 70  close to 50.  If not at goal.  Need to be more aggressive.      Relevant Medications   metoprolol tartrate (LOPRESSOR) 25 MG tablet   Chronic diastolic heart failure (HCC) (Chronic)    Relatively preserved EF, but likely long-standing valvular heart disease from the hypertension.  Currently seems euvolemic.  Not requiring any additional diuretic besides his baseline 40 mg Lasix dose.  Blood pressures well controlled no need for additional afterload reduction.  He is actually taking his Lasix every day, we had tried to make it as needed and he used it every day.      Relevant Medications   metoprolol tartrate (LOPRESSOR) 25 MG tablet   Other Relevant Orders   EKG 12-Lead (Completed)   RESOLVED: CAD (coronary artery disease) - Primary   Relevant Medications   metoprolol tartrate (LOPRESSOR) 25 MG tablet   Other Relevant Orders   EKG 12-Lead (Completed)   [place]rial [place], permanent [place]; CHA2DS2-VASc Score 4 - on Pradaxa. BB & CCB for RATE control (Chronic)    Rate well controlled on metoprolol.  However with him having fatigue and would reduce his dose to 50 twice daily, and continue current dose of calcium channel blocker. Remains on Pradaxa for anticoagulation.  He is doing well.  No reason to switch. No sensation of being in rate controlled atrial fib.  Need to watch for tachycardia with reduced dose of beta-blocker --if that occurred, would increase CCB.>       Relevant Medications   metoprolol tartrate (LOPRESSOR) 25 MG tablet   Other Relevant Orders   EKG 12-Lead (Completed)      Current medicines are reviewed at length with the patient today. (+/- concerns) n/a The following changes have been made: He has Amoxil for SBE prophylaxis.   Patient Instructions  Medication Instructions:  CHANGE METOPROLOL 50 MG TWICE A DAY. YOU MAY TAKE AND EXTRA 25 MG AS NEEDED   Labwork: NONE  Testing/Procedures: NONE  Follow-Up: Your physician wants you to follow-up in: [place] receive a reminder letter in the mail two months in advance. If you don't receive a letter,  please call our office to schedule the follow-up appointment.  If you need a refill on your cardiac medications before your next appointment, please call your pharmacy.    Studies Ordered:   Orders Placed This Encounter  Procedures  . EKG 12-Lead      Glenetta Hew, M.D., M.S. Interventional Cardiologist   Pager # [phone] Phone # [phone] [address]. [place], [place] [address]   Thank you for choosing Heartcare at [facility]!!

## 2017-06-26 NOTE — Patient Instructions (Signed)
Medication Instructions:  CHANGE METOPROLOL 50 MG TWICE A DAY. YOU MAY TAKE AND EXTRA 25 MG AS NEEDED   Labwork: NONE  Testing/Procedures: NONE  Follow-Up: Your physician wants you to follow-up in: Richard Simon will receive a reminder letter in the mail two months in advance. If you don't receive a letter, please call our office to schedule the follow-up appointment.  If you need a refill on your cardiac medications before your next appointment, please call your pharmacy.

## 2017-06-28 ENCOUNTER — Encounter: Payer: Self-pay | Admitting: Cardiology

## 2017-06-28 NOTE — Assessment & Plan Note (Signed)
He has severe native CAD and a left dominant system.  All 4 out of 4 grafts are patent (likely because of the severe native disease. No active anginal symptoms on stable dose of beta-blocker and diltiazem.

## 2017-06-28 NOTE — Assessment & Plan Note (Signed)
Well-controlled blood pressure on current medications.  He is on beta-blocker and calcium channel blocker.

## 2017-06-28 NOTE — Assessment & Plan Note (Addendum)
He is doing better overall with a reduced beta-blocker dose in the evening, but I will reduce even further to 50mg  twice daily.  Need to watch for bradycardia, but I think he is noticing some fatigue from the beta-blocker dose.

## 2017-06-28 NOTE — Assessment & Plan Note (Addendum)
Rate well controlled on metoprolol.  However with him having fatigue and would reduce his dose to 50 twice daily, and continue current dose of calcium channel blocker. Remains on Pradaxa for anticoagulation.  He is doing well.  No reason to switch. No sensation of being in rate controlled atrial fib.  Need to watch for tachycardia with reduced dose of beta-blocker --if that occurred, would increase CCB.>

## 2017-06-28 NOTE — Assessment & Plan Note (Signed)
1 year out echo looks wonderful.  No suggestion of aortic stenosis.  Probably can follow every couple years.

## 2017-06-28 NOTE — Assessment & Plan Note (Signed)
Doing well S/P TAVR --follow-up echo looks great.

## 2017-06-28 NOTE — Assessment & Plan Note (Signed)
Relatively preserved EF, but likely long-standing valvular heart disease from the hypertension.  Currently seems euvolemic.  Not requiring any additional diuretic besides his baseline 40 mg Lasix dose.  Blood pressures well controlled no need for additional afterload reduction.  He is actually taking his Lasix every day, we had tried to make it as needed and he used it every day.

## 2017-06-28 NOTE — Assessment & Plan Note (Signed)
No  current labs available.  He remains on stable dose of Crestor along with fish oil. Target LDL is less than 70 close to 50.  If not at goal.  Need to be more aggressive.

## 2017-06-30 ENCOUNTER — Other Ambulatory Visit: Payer: Self-pay | Admitting: Cardiology

## 2017-07-02 ENCOUNTER — Telehealth: Payer: Self-pay | Admitting: Cardiology

## 2017-07-02 NOTE — Telephone Encounter (Signed)
New Message    Patient calling the office for samples of medication:   1.  What medication and dosage are you requesting samples for? metoprolol tartrate (LOPRESSOR) 25 MG tablet  2.  Are you currently out of this medication? No, has 5 days remaining. Patient states that [facility] says the rx is on back order.

## 2017-07-02 NOTE — Telephone Encounter (Signed)
WE DO NOT HAVE SAMPLES-PT NOTIFIED HE WILL CALL BACK FOR NEW RX

## 2017-07-05 ENCOUNTER — Encounter: Payer: Self-pay | Admitting: Thoracic Surgery (Cardiothoracic Vascular Surgery)

## 2017-09-20 DIAGNOSIS — I2510 Atherosclerotic heart disease of native coronary artery without angina pectoris: Secondary | ICD-10-CM | POA: Diagnosis not present

## 2017-09-20 DIAGNOSIS — I10 Essential (primary) hypertension: Secondary | ICD-10-CM | POA: Diagnosis not present

## 2017-09-20 DIAGNOSIS — E782 Mixed hyperlipidemia: Secondary | ICD-10-CM | POA: Diagnosis not present

## 2017-09-20 DIAGNOSIS — Z0000 Encounter for general adult medical examination without abnormal findings: Secondary | ICD-10-CM | POA: Diagnosis not present

## 2017-09-20 DIAGNOSIS — R7303 Prediabetes: Secondary | ICD-10-CM | POA: Diagnosis not present

## 2017-09-27 DIAGNOSIS — I5032 Chronic diastolic (congestive) heart failure: Secondary | ICD-10-CM | POA: Diagnosis not present

## 2017-09-27 DIAGNOSIS — E782 Mixed hyperlipidemia: Secondary | ICD-10-CM | POA: Diagnosis not present

## 2017-09-27 DIAGNOSIS — I10 Essential (primary) hypertension: Secondary | ICD-10-CM | POA: Diagnosis not present

## 2017-09-27 DIAGNOSIS — I2510 Atherosclerotic heart disease of native coronary artery without angina pectoris: Secondary | ICD-10-CM | POA: Diagnosis not present

## 2017-09-27 DIAGNOSIS — R7303 Prediabetes: Secondary | ICD-10-CM | POA: Diagnosis not present

## 2017-09-27 DIAGNOSIS — Z952 Presence of prosthetic heart valve: Secondary | ICD-10-CM | POA: Diagnosis not present

## 2017-09-27 DIAGNOSIS — I481 Persistent atrial fibrillation: Secondary | ICD-10-CM | POA: Diagnosis not present

## 2017-09-27 DIAGNOSIS — M150 Primary generalized (osteo)arthritis: Secondary | ICD-10-CM | POA: Diagnosis not present

## 2017-09-27 DIAGNOSIS — I359 Nonrheumatic aortic valve disorder, unspecified: Secondary | ICD-10-CM | POA: Diagnosis not present

## 2017-11-20 DIAGNOSIS — I481 Persistent atrial fibrillation: Secondary | ICD-10-CM | POA: Diagnosis not present

## 2017-11-20 DIAGNOSIS — I10 Essential (primary) hypertension: Secondary | ICD-10-CM | POA: Diagnosis not present

## 2017-11-20 DIAGNOSIS — F0390 Unspecified dementia without behavioral disturbance: Secondary | ICD-10-CM | POA: Diagnosis not present

## 2017-11-20 DIAGNOSIS — E782 Mixed hyperlipidemia: Secondary | ICD-10-CM | POA: Diagnosis not present

## 2017-11-20 DIAGNOSIS — I2510 Atherosclerotic heart disease of native coronary artery without angina pectoris: Secondary | ICD-10-CM | POA: Diagnosis not present

## 2018-01-11 ENCOUNTER — Other Ambulatory Visit: Payer: Self-pay | Admitting: Cardiology

## 2018-02-15 ENCOUNTER — Other Ambulatory Visit: Payer: Self-pay | Admitting: Cardiology

## 2018-04-23 ENCOUNTER — Other Ambulatory Visit: Payer: Self-pay | Admitting: Cardiology

## 2018-05-22 ENCOUNTER — Other Ambulatory Visit: Payer: Self-pay | Admitting: Cardiology

## 2018-05-22 ENCOUNTER — Telehealth: Payer: Self-pay | Admitting: *Deleted

## 2018-05-22 MED ORDER — DABIGATRAN ETEXILATE MESYLATE 150 MG PO CAPS
150.0000 mg | ORAL_CAPSULE | Freq: Two times a day (BID) | ORAL | 3 refills | Status: DC
Start: 2018-05-22 — End: 2019-07-21

## 2018-05-22 NOTE — Telephone Encounter (Signed)
PATIENT WALKED -IN OFFICE. RECEIVED  PATIENT ASSISTANCE FORM FROM PATIENT ON 05/21/18-  PRINTED PRESCRIPTION SENT WITH FORM  FORMED COMPLETED AND  FAXED TODAY

## 2018-05-23 NOTE — Telephone Encounter (Signed)
CALLED PATIENT- LINE BUSY   CALLING TO INFORM HIM  INFORMATION WAS FAXED FOR PATIENT ASISTANCE  ALSO PATIENT NEED AN APPOINTMENT FOR 06/2018- 1 YEAR FOLLOUP

## 2018-05-23 NOTE — Telephone Encounter (Signed)
PATIENT CALLED BACK - AWARE INFORMATION FAXED - APPOINTMENT MADE FOR 07/11/18 AT 2:40 PM

## 2018-07-11 ENCOUNTER — Ambulatory Visit: Payer: Medicare HMO | Admitting: Cardiology

## 2018-07-26 ENCOUNTER — Other Ambulatory Visit: Payer: Self-pay | Admitting: Cardiology

## 2018-07-26 NOTE — Telephone Encounter (Signed)
Diltiazem,  klor con, and furosemide.

## 2018-08-09 ENCOUNTER — Telehealth: Payer: Self-pay | Admitting: *Deleted

## 2018-08-09 NOTE — Telephone Encounter (Signed)
   TELEPHONE CALL NOTE  This patient has been deemed a candidate for follow-up tele-health visit to limit community exposure during the Covid-19 pandemic. I spoke with the patient via phone to discuss instructions.  The patient will receive a phone call 2-3 days prior to their E-Visit at which time consent will be verbally confirmed.   A Virtual Office Visit appointment type has been scheduled for 2/29  with [facility]- patient prefers TELEPHONE ONLY  type.    Raiford Simmonds, RN 08/09/2018 1:57 PM

## 2018-08-13 ENCOUNTER — Telehealth: Payer: Self-pay | Admitting: Cardiology

## 2018-08-13 NOTE — Telephone Encounter (Signed)
Declined mychart, no smartphone, pre reg complete 08/13/18 AF °

## 2018-08-14 ENCOUNTER — Telehealth (INDEPENDENT_AMBULATORY_CARE_PROVIDER_SITE_OTHER): Payer: Medicare HMO | Admitting: Cardiology

## 2018-08-14 ENCOUNTER — Telehealth: Payer: Self-pay | Admitting: *Deleted

## 2018-08-14 ENCOUNTER — Encounter: Payer: Self-pay | Admitting: Cardiology

## 2018-08-14 VITALS — BP 120/67 | HR 71 | Ht 70.0 in

## 2018-08-14 DIAGNOSIS — Z7901 Long term (current) use of anticoagulants: Secondary | ICD-10-CM

## 2018-08-14 DIAGNOSIS — I5032 Chronic diastolic (congestive) heart failure: Secondary | ICD-10-CM

## 2018-08-14 DIAGNOSIS — I4821 Permanent atrial fibrillation: Secondary | ICD-10-CM

## 2018-08-14 DIAGNOSIS — E785 Hyperlipidemia, unspecified: Secondary | ICD-10-CM

## 2018-08-14 DIAGNOSIS — I10 Essential (primary) hypertension: Secondary | ICD-10-CM

## 2018-08-14 DIAGNOSIS — R413 Other amnesia: Secondary | ICD-10-CM | POA: Insufficient documentation

## 2018-08-14 DIAGNOSIS — Z953 Presence of xenogenic heart valve: Secondary | ICD-10-CM

## 2018-08-14 DIAGNOSIS — I2510 Atherosclerotic heart disease of native coronary artery without angina pectoris: Secondary | ICD-10-CM

## 2018-08-14 MED ORDER — FUROSEMIDE 40 MG PO TABS: ORAL_TABLET | ORAL | 3 refills | Status: DC

## 2018-08-14 MED ORDER — AMOXICILLIN 500 MG PO TABS: ORAL_TABLET | ORAL | 4 refills | Status: AC

## 2018-08-14 NOTE — Patient Instructions (Addendum)
Medication Instructions:    STOP TAKING Rosuvastatin  Reduce Furosemide to 1/2 tab daily (use additional 1/2 tab for worsening swelling) ( MEDICATION WAS NOT SENT TO PHARMACY PER YOUR REQUEST )    SBE prophylaxis: TAKE AMOXIL 2g (four 500mg  tablets) one hour prior to dental procedures.( MEDICATION WAS SENT TO PHARMACY AND PLACED ON FILE)  -- Recommend Mucinex (guaifinesin) for congestion     If you need a refill on your cardiac medications before your next appointment, please call your pharmacy.   Lab work:  not needed  If you have labs (blood work) drawn today and your tests are completely normal, you will receive your results only by: Marland Kitchen MyChart Message (if you have MyChart) OR . A paper copy in the mail If you have any lab test that is abnormal or we need to change your treatment, we will call you to review the results.  Testing/Procedures: NOT NEEDED  Follow-Up: At [facility], you and your health needs are our priority.  As part of our continuing mission to provide you with exceptional heart care, we have created designated Provider Care Teams.  These Care Teams include your primary Cardiologist (physician) and Advanced Practice Providers (APPs -  Physician Assistants and Nurse Practitioners) who all work together to provide you with the care you need, when you need it. You will need a follow up appointment in 6 months OCT 2020.  Please call our office 2 months in advance to schedule this appointment.  You may see Glenetta Hew, MD or one of the following Advanced Practice Providers on your designated Care Team:   Rosaria Ferries, PA-C . Jory Sims, DNP, ANP  Any Other Special Instructions Will Be Listed Below (If Applicable).

## 2018-08-14 NOTE — Telephone Encounter (Signed)
SPOKE TO PATIENT - INSTRUCTION GIVEN TELE-VISIT 4/29-- MAILED AVS SUMMARY  PATIENT VERBALIZED UNDERSTANDING

## 2018-08-14 NOTE — Progress Notes (Signed)
Virtual Visit via Telephone Note   This visit type was conducted due to national recommendations for restrictions regarding the COVID-19 Pandemic (e.g. social distancing) in an effort to limit this patient's exposure and mitigate transmission in our community.  Due to his co-morbid illnesses, this patient is at least at moderate risk for complications without adequate follow up.  This format is felt to be most appropriate for this patient at this time.  The patient did not have access to video technology/had technical difficulties with video requiring transitioning to audio format only (telephone).  All issues noted in this document were discussed and addressed.  No physical exam could be performed with this format.  Please refer to the patient's chart for his  consent to telehealth for [facility].   Patient does not have video technology available for video call.  Should this with evidence of apnea challenge  Patient has given verbal permission to conduct this visit via virtual appointment and to bill insurance 08/15/2018 6:41 PM     Evaluation Performed:  Follow-up visit  Date:  08/15/2018   ID:  Richard Simon, DOB 1929-10-05, MRN [id number]  Patient Location: Home Provider Location: Office  PCP:  Merrilee Seashore, MD  Cardiologist:  Glenetta Hew, MD Electrophysiologist:  None   Chief Complaint:  Annual f/u - CAD, Afib & s/p TAVR  History of Present Illness:    Richard Simon is a 85 y.o. male with PMH notable for CAD,Afib & AS-s/p TAVR (detailed below) who presents via audio/video conferencing for a telehealth visit today.    Ischemic CAD s/p CABG (LIMA to LAD, SVG-Diag, SeqSVG-OM1-OM2) in 2004,   Longstanding, target atrial fibrillation Pradaxa  Severe/critical AS s/p TAVR in February-March 2018 (Drs. [place] [facility] [place])   HTN, HLD, chronic diastolic CHF  Doron Shake was last seen In March 2019.  Was doing well overall.  No major complaints.  No signs or symptoms of A. fib.  Was  under a lot of stress because his wife was now in a skilled nursing facility.  Interval History:  Doing well - just bothered by congestion & phlegm -- worse in AM.  No Fever or cough.  Hard to breath with congestion.   (Concerned about PCP started on dementia medication - Donepazil) No active cardiac complaints.  Cardiovascular ROS: no chest pain or dyspnea on exertion positive for - orthostatic dizziness negative for - edema, irregular heartbeat, orthopnea, palpitations, paroxysmal nocturnal dyspnea, rapid heart rate, shortness of breath or syncope/near syncope; TIA/amaurosis; claudication  The patient does not have symptoms concerning for COVID-19 infection (fever, chills, cough, or new shortness of breath). Has allergy related congestion. The patient is practicing social distancing.  Tends to ramble - long time answering questions.  ROS:  Please see the history of present illness.    Review of Systems  Constitutional: Negative for malaise/fatigue (less energy) and weight loss.  HENT: Positive for congestion (phlegm). Negative for nosebleeds.   Respiratory: Negative for cough and shortness of breath.   Gastrointestinal: Positive for constipation. Negative for blood in stool, diarrhea, melena, nausea and vomiting.  Genitourinary: Negative for hematuria.  Musculoskeletal: Positive for joint pain (elbows ache). Negative for myalgias.  Neurological: Positive for dizziness (can get a little lightheaded.).  Psychiatric/Behavioral: Negative for memory loss.  All other systems reviewed and are negative. Less than usual get up and go.  Joint pains.  Mild dizziness.   Past Medical History:  Diagnosis Date  . Atrial fibrillation, permanent    Rate controlled.  Anticoagulation with Pradaxa.  . CAD, multiple vessel 2004   S/P 4-vessel CABG in 2004: Pre-TAVR CATH: Occluded OM1 and distal circumflex PRIOR TO GRAFT; 70&80% mid LAD stenosis prior to LIMA graft, 95% D1 stenosis prior to graft. 4/4  Grafts patent (LIMA-LAD, SVG-DIAG, SVG-OM-OM).    . Dyslipidemia, goal LDL below 70   . Hypertension     very well controlled  . S/P CABG x 4 2004   Post CABG  . 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 Burt Knack: Oletta Lamas Sapient 3 29 mm biprosthetic valve  . Severe aortic stenosis by prior echocardiogram - s/pTAVR 01/2013; 06/2014   05/2016: Presented with progressively worsening aortic stenosis and referred for TAVR. - Underwent TAVR March 2018   Past Surgical History:  Procedure Laterality Date  . CARDIAC CATHETERIZATION  12/11/2002   Recommendation-CABG; 80% mid LAD, 70% distal LAD. Mid circumflex 95%, OM1 95%. AV groove circumflex 95%. Proximal RCA 100%  . CORONARY ARTERY BYPASS GRAFT  12/15/2002   LIMA-LAD, SVG-diagonal, SVG-OM1-OM2 sequential  Carlton Adam MYOVIEW  December 2012   Fixed basal inferior diaphragmatic attenuation versusinfarct, no ischemia  . MULTIPLE EXTRACTIONS WITH ALVEOLOPLASTY N/A 06/20/2016   Procedure: EXTRACTION of #7, 12, 13, and 15 WITH ALVEOLOPLASTY and gross debridement of teeth;  Surgeon: Lenn Cal, DDS;  Location: [place];  Service: Oral Surgery;  Laterality: N/A;  . RIGHT HEART CATH AND CORONARY/GRAFT ANGIOGRAPHY N/A 06/13/2016   Procedure: Right Heart Cath and Coronary/Graft Angiography;  Surgeon: Leonie Man, MD;  Location: [place] [facility];  Service: Cardiovascular: Occluded OM1 and distal circumflex PRIOR TO GRAFT; 70&80% mid LAD stenosis prior to LIMA graft, 95% D1 stenosis prior to graft. 4/4 Grafts patent (LIMA-LAD, SVG-DIAG, SVG-OM-OM).   Mild 2 pulmonary hypertension: PCWP 25 mmHg. PA peak/mean 48/23/33 mmH  . TEE WITHOUT CARDIOVERSION N/A 06/22/2016   Procedure: TRANSESOPHAGEAL ECHOCARDIOGRAM (TEE);  Surgeon: Sherren Mocha, MD;  Location: [place];  Service: Open Heart Surgery;  Laterality: N/A;  . TRANSCATHETER AORTIC VALVE REPLACEMENT, TRANSFEMORAL N/A 06/22/2016   Procedure: TRANSCATHETER AORTIC VALVE  REPLACEMENT, TRANSFEMORAL;  Surgeon: Sherren Mocha, MD;  Location: [place];  Service: Open Heart Surgery: Oletta Lamas Sapient 3 29 mm biprosthetic walve.  . TRANSTHORACIC ECHOCARDIOGRAM  06/2016   Post TAVR:  EF 40-45% with diffuse hypokinesis. High LV filling pressures. Well-seated Edwards's Sapien 3 TAVR bioprosthesis.  - AVA ~1.5. PA pressures now estimated 43 mmHg.  Marland Kitchen TRANSTHORACIC ECHOCARDIOGRAM  05/2017   Normal LV size.  Mild LVH.  EF 55% with no regional wall motion normality.  Cannot assess diastolic function.  Post TAVR with no significant perivalvular regurgitation and stable gradients.  Moderate MAC.  Mildly fibrotic/calcified (no comment of MR).  Moderate LA and RA dilation.  Moderate RV dilation but with normal function.  . TRANSTHORACIC ECHOCARDIOGRAM  06/2014   EF 60-65%. Mod-Severe AS, AVA~0.69, Mn-Pk gradient 32 mmHg -- 55 mmHg, severe LA dilation, moderate-severe RA dilation: Progression of disease     Current Meds  Medication Sig  . Acetaminophen 500 MG coapsule Take 1 tablet by mouth every 6 (six) hours as needed for pain.   Marland Kitchen amoxicillin (AMOXIL) 500 MG tablet Take 4 tablets by mouth one hour prior to dental appointment  . dabigatran (PRADAXA) 150 MG CAPS capsule Take 1 capsule (150 mg total) by mouth 2 (two) times daily.  Marland Kitchen diltiazem (CARDIZEM CD) 240 MG 24 hr capsule TAKE 1 CAPSULE EVERY DAY  . furosemide (LASIX) 40 MG tablet TAKE  1/2 TABLET OF 40 MG  BY MOUTH DAILY , MAY TAKE AN ADDITIONAL 1/2 TABLET OF 40 MG AS NEED FOR SWELLING  . metoprolol tartrate (LOPRESSOR) 25 MG tablet TAKE 2 TABLETS TWICE DAILY. MAY TAKE AN EXTRA 25MG IF NEEDED FOR PALPITATIONS.  . Omega-3 Fatty Acids (FISH OIL) 1200 MG CAPS Take 1,200 mg by mouth 2 (two) times daily.  . Potassium Chloride ER 20 MEQ TBCR TAKE 1 TABLET EVERY DAY  . sennosides-docusate sodium (SENOKOT-S) 8.6-50 MG tablet Take 2 tablets by mouth at bedtime.  . [DISCONTINUED] amoxicillin (AMOXIL) 500 MG tablet Take 4 tablets by mouth one  hour prior to dental appointment  . [DISCONTINUED] furosemide (LASIX) 40 MG tablet TAKE 1 TABLET EVERY DAY  . [DISCONTINUED] rosuvastatin (CRESTOR) 10 MG tablet TAKE 1 TABLET EVERY DAY    -- PCP started Donepezil  Allergies:   No known allergies   Social History   Tobacco Use  . Smoking status: Former Smoker    Last attempt to quit: 02/12/1975    Years since quitting: 43.5  . Smokeless tobacco: Never Used  Substance Use Topics  . Alcohol use: Yes    Alcohol/week: 14.0 standard drinks    Types: 14 Glasses of wine per week  . Drug use: No   - wife now in SNF.  Now lives with son -- not the best / strict diet.   Family Hx: The patient's family history includes Hip fracture in his mother.   Prior CV studies:   The following studies were reviewed today: . None since 05/2017  Labs/Other Tests and Data Reviewed:    EKG:  No ECG reviewed.  Recent Labs: No results found for requested labs within last 8760 hours.   Recent Lipid Panel - none available   Wt Readings from Last 3 Encounters:  06/26/17 196 lb 9.6 oz (89.2 kg)  05/30/17 196 lb (88.9 kg)  07/27/16 171 lb (77.6 kg)     Objective:    Vital Signs:  BP 120/67   Pulse 71   Ht _0  (1.778 m)   BMI 28.21 kg/m   VITAL SIGNS:  reviewed GEN:  no acute distress RESPIRATORY:  non-labored, no overt wheezing or coughing NEURO:  alert and oriented x 3, no obvious focal deficit PSYCH:  normal affect   ASSESSMENT & PLAN:    Problem List Items Addressed This Visit    Atrial fibrillation, permanent ([place]); CHA2DS2-VASc Score 4 - on Pradaxa. BB & CCB for RATE control (Chronic)    Pretty much chronic persistent atrial fibrillation rate controlled with metoprolol and diltiazem.  He does not really notice the atrial fibrillation, nor has he noted any fast heart rates.  We previously reduced the Toprol dose to 25 mg twice daily with additional 25 as needed in order to allow diltiazem and avoiding fatigue.  Still doing  well on Pradaxa for anticoagulation.      Relevant Medications   furosemide (LASIX) 40 MG tablet   Chronic diastolic heart failure (HCC) (Chronic)    Heart failure with preserved EF likely related to longstanding CAD, hypertension and valvular heart disease. Euvolemic without any really complaints of PND, orthopnea or edema. Is on a stable dose of Lasix, and not having to take additional doses.  Since he is having some lightheaded and dizziness, I am actually back off on his diuretic by having him take a half a tablet of Lasix daily with additional 20 mg as needed.  Is on beta-blocker and calcium channel blocker  for blood pressure control.  Is not on an ARB, but for now we will just hold off given his age and desire to avoid renal complications.      Relevant Medications   furosemide (LASIX) 40 MG tablet   Current use of long term anticoagulation (Chronic)   Dyslipidemia, goal LDL below 70 (Chronic)    Still do not have labs available.  However with him having some memory issues recently started on donepezil, I think we can probably just simply stop the statin with the thought that maybe this would help reduce memory issues.      Relevant Medications   furosemide (LASIX) 40 MG tablet   Essential hypertension (Chronic)    Blood pressure well controlled on beta-blocker plus calcium channel blocker without ARB.  At this point will just simply continue current meds      Relevant Medications   furosemide (LASIX) 40 MG tablet   Memory loss or impairment    He had questions about donepezil.  Will defer to PCP.  However with concerns for memory issues were again to stop the statin just to avoid any potential complication.      Multivessel Native CAD - s/p CABGx4: LIMA to LAD, SVG-Diag, seqSVG-OM1-OM2 - Primary (Chronic)    Severe multivessel CAD with left dominant system.  Prior to TAVR is 4 out of 4 grafts are patent.  No active angina.  Is on a beta-blocker and diltiazem. Is on low-dose  statin, but not aspirin because of Pradaxa.      Relevant Medications   furosemide (LASIX) 40 MG tablet   S/p TAVR (transcatheter aortic valve replacement), bioprosthetic: Dr, Burt Knack (Chronic)    1 year out echo looks wonderful.  Will defer timing of follow-up echocardiogram to the TAVR team.  He has NYHA class I symptoms.  Stays on stable dose of Lasix but no real PND or orthopnea.  Is on Pradaxa, therefore not on aspirin or Plavix.        NYHA Class Ic - forward to [facility] - ? F/u Echo  COVID-19 Education: The signs and symptoms of COVID-19 were discussed with the patient and how to seek care for testing (follow up with PCP or arrange E-visit).   The importance of social distancing was discussed today.  Time:   Today, I have spent 20-22 minutes with the patient with telehealth technology discussing the above problems.     Medication Adjustments/Labs and Tests Ordered: Current medicines are reviewed at length with the patient today.  Concerns regarding medicines are outlined above.  Medication Instructions:    D/c Rosuvastatin  Reduce Furosemide to 1/2 tab daily (use additional 1/2 tab for worsening swelling)   SBE prophylaxis: TAKE AMOXIL 2g (four 531m tablets) one hour prior to dental procedures.  -- Recommend Mucinex (guaifinesin) for congestion  Tests Ordered: No orders of the defined types were placed in this encounter.   Medication Changes: Meds ordered this encounter  Medications  . amoxicillin (AMOXIL) 500 MG tablet    Sig: Take 4 tablets by mouth one hour prior to dental appointment    Dispense:  4 tablet    Refill:  4    PLEASE KEEP ON FILE - PATIENT WILL CALL WHEN NEEDED  . furosemide (LASIX) 40 MG tablet    Sig: TAKE 1/2 TABLET OF 40 MG  BY MOUTH DAILY , MAY TAKE AN ADDITIONAL 1/2 TABLET OF 40 MG AS NEED FOR SWELLING    Dispense:  90 tablet  Refill:  3     Disposition:  Follow up in 6 month(s)    Signed, Glenetta Hew, MD  08/15/2018  6:41 PM    [place]

## 2018-08-15 ENCOUNTER — Encounter: Payer: Self-pay | Admitting: Cardiology

## 2018-08-15 NOTE — Assessment & Plan Note (Signed)
Severe multivessel CAD with left dominant system.  Prior to TAVR is 4 out of 4 grafts are patent.  No active angina.  Is on a beta-blocker and diltiazem. Is on low-dose statin, but not aspirin because of Pradaxa.

## 2018-08-15 NOTE — Assessment & Plan Note (Addendum)
Heart failure with preserved EF likely related to longstanding CAD, hypertension and valvular heart disease. Euvolemic without any really complaints of PND, orthopnea or edema. Is on a stable dose of Lasix, and not having to take additional doses.  Since he is having some lightheaded and dizziness, I am actually back off on his diuretic by having him take a half a tablet of Lasix daily with additional 20 mg as needed.  Is on beta-blocker and calcium channel blocker for blood pressure control.  Is not on an ARB, but for now we will just hold off given his age and desire to avoid renal complications.

## 2018-08-15 NOTE — Assessment & Plan Note (Addendum)
1 year out echo looks wonderful.  Will defer timing of follow-up echocardiogram to the TAVR team.  He has NYHA class I symptoms.  Stays on stable dose of Lasix but no real PND or orthopnea.  Is on Pradaxa, therefore not on aspirin or Plavix.  Has as needed prescription for amoxicillin for dental or GI procedures-SBE prophylaxis

## 2018-08-15 NOTE — Assessment & Plan Note (Signed)
Pretty much chronic persistent atrial fibrillation rate controlled with metoprolol and diltiazem.  He does not really notice the atrial fibrillation, nor has he noted any fast heart rates.  We previously reduced the Toprol dose to 25 mg twice daily with additional 25 as needed in order to allow diltiazem and avoiding fatigue.  Still doing well on Pradaxa for anticoagulation.

## 2018-08-15 NOTE — Assessment & Plan Note (Signed)
He had questions about donepezil.  Will defer to PCP.  However with concerns for memory issues were again to stop the statin just to avoid any potential complication.

## 2018-08-15 NOTE — Assessment & Plan Note (Signed)
Still do not have labs available.  However with him having some memory issues recently started on donepezil, I think we can probably just simply stop the statin with the thought that maybe this would help reduce memory issues.

## 2018-08-15 NOTE — Assessment & Plan Note (Signed)
Blood pressure well controlled on beta-blocker plus calcium channel blocker without ARB.  At this point will just simply continue current meds

## 2018-08-16 NOTE — Progress Notes (Signed)
OK - thanks

## 2018-08-16 NOTE — Progress Notes (Signed)
Thanks Dr. Ellyn Hack! I don't usually follow these patients out past 1 year. I think most get a repeat echo at least every other year or when clinically indicated going forward.

## 2018-09-18 ENCOUNTER — Other Ambulatory Visit: Payer: Self-pay | Admitting: Cardiology

## 2018-10-03 ENCOUNTER — Telehealth: Payer: Self-pay | Admitting: Cardiology

## 2018-10-03 NOTE — Telephone Encounter (Signed)
Spoke to patient advised pradaxa 150 mg samples will be left at front desk.

## 2018-10-03 NOTE — Telephone Encounter (Signed)
New Message  Patient calling the office for samples of medication:   1.  What medication and dosage are you requesting samples for? dabigatran (PRADAXA) 150 MG CAPS capsule    2.  Are you currently out of this medication? 2 remaining

## 2018-10-08 DIAGNOSIS — E782 Mixed hyperlipidemia: Secondary | ICD-10-CM | POA: Diagnosis not present

## 2018-10-08 DIAGNOSIS — I10 Essential (primary) hypertension: Secondary | ICD-10-CM | POA: Diagnosis not present

## 2018-10-08 DIAGNOSIS — I2510 Atherosclerotic heart disease of native coronary artery without angina pectoris: Secondary | ICD-10-CM | POA: Diagnosis not present

## 2018-10-08 DIAGNOSIS — R7309 Other abnormal glucose: Secondary | ICD-10-CM | POA: Diagnosis not present

## 2018-10-08 DIAGNOSIS — R7303 Prediabetes: Secondary | ICD-10-CM | POA: Diagnosis not present

## 2018-11-20 ENCOUNTER — Other Ambulatory Visit: Payer: Self-pay | Admitting: Cardiology

## 2018-11-25 ENCOUNTER — Other Ambulatory Visit: Payer: Self-pay | Admitting: Cardiology

## 2018-12-03 ENCOUNTER — Telehealth: Payer: Self-pay | Admitting: Cardiology

## 2018-12-03 NOTE — Telephone Encounter (Signed)
PT CALLED TO SCHEDULE HIS 18MO F/U IN OCTOBER

## 2018-12-03 NOTE — Telephone Encounter (Signed)
New Message     Pt is calling and would like for a nurse to call him      Please call back

## 2019-01-14 DIAGNOSIS — Z952 Presence of prosthetic heart valve: Secondary | ICD-10-CM | POA: Diagnosis not present

## 2019-01-14 DIAGNOSIS — I4819 Other persistent atrial fibrillation: Secondary | ICD-10-CM | POA: Diagnosis not present

## 2019-01-14 DIAGNOSIS — I359 Nonrheumatic aortic valve disorder, unspecified: Secondary | ICD-10-CM | POA: Diagnosis not present

## 2019-01-14 DIAGNOSIS — I2510 Atherosclerotic heart disease of native coronary artery without angina pectoris: Secondary | ICD-10-CM | POA: Diagnosis not present

## 2019-01-14 DIAGNOSIS — Z7189 Other specified counseling: Secondary | ICD-10-CM | POA: Diagnosis not present

## 2019-01-14 DIAGNOSIS — Z0000 Encounter for general adult medical examination without abnormal findings: Secondary | ICD-10-CM | POA: Diagnosis not present

## 2019-01-14 DIAGNOSIS — Z23 Encounter for immunization: Secondary | ICD-10-CM | POA: Diagnosis not present

## 2019-01-14 DIAGNOSIS — R7303 Prediabetes: Secondary | ICD-10-CM | POA: Diagnosis not present

## 2019-01-14 DIAGNOSIS — F0390 Unspecified dementia without behavioral disturbance: Secondary | ICD-10-CM | POA: Diagnosis not present

## 2019-01-14 DIAGNOSIS — I5032 Chronic diastolic (congestive) heart failure: Secondary | ICD-10-CM | POA: Diagnosis not present

## 2019-02-04 ENCOUNTER — Other Ambulatory Visit: Payer: Self-pay | Admitting: Cardiology

## 2019-02-06 ENCOUNTER — Other Ambulatory Visit: Payer: Self-pay

## 2019-02-06 ENCOUNTER — Ambulatory Visit (INDEPENDENT_AMBULATORY_CARE_PROVIDER_SITE_OTHER): Payer: Medicare HMO | Admitting: Cardiology

## 2019-02-06 ENCOUNTER — Encounter: Payer: Self-pay | Admitting: Cardiology

## 2019-02-06 VITALS — BP 142/78 | HR 72 | Ht 70.0 in | Wt 184.5 lb

## 2019-02-06 DIAGNOSIS — E785 Hyperlipidemia, unspecified: Secondary | ICD-10-CM

## 2019-02-06 DIAGNOSIS — I5032 Chronic diastolic (congestive) heart failure: Secondary | ICD-10-CM

## 2019-02-06 DIAGNOSIS — I10 Essential (primary) hypertension: Secondary | ICD-10-CM

## 2019-02-06 DIAGNOSIS — R5383 Other fatigue: Secondary | ICD-10-CM

## 2019-02-06 DIAGNOSIS — Z953 Presence of xenogenic heart valve: Secondary | ICD-10-CM | POA: Diagnosis not present

## 2019-02-06 DIAGNOSIS — I2510 Atherosclerotic heart disease of native coronary artery without angina pectoris: Secondary | ICD-10-CM

## 2019-02-06 DIAGNOSIS — I4821 Permanent atrial fibrillation: Secondary | ICD-10-CM | POA: Diagnosis not present

## 2019-02-06 MED ORDER — METOPROLOL SUCCINATE ER 25 MG PO TB24: 25.0000 mg | ORAL_TABLET | Freq: Every day | ORAL | 3 refills | Status: DC

## 2019-02-06 NOTE — Progress Notes (Signed)
PCP: Merrilee Seashore, MD  Clinic Note: Chief Complaint  Patient presents with  . Follow-up    6 month  . Coronary Artery Disease  . Aortic Stenosis    s/p TAVR     HPI:    Richard Simon is a 85 y.o. male with a significant cardiac history below who presents today for 6 month f/u.   2004: Ischemic CAD s/p CABG (LIMA to LAD, SVG-Diag, SeqSVG-OM1-OM2)   Longstanding, permanent atrial fibrillation -->  Pradaxa., rate controlled.   Severe/critical AS s/p TAVR in February-March 2018 (Drs. Cooper & Stephens)   HTN, HLD, chronic diastolic CHF  Richard Simon was last seen via telemedicine conferencing on August 14, 2018 -> he had he was doing well this had some chronic nasal drip related cough and congestion worse in the morning.  This makes it hard for her to breathe sometimes in the morning but otherwise okay.  Was little bit concerned because his PCP started him on 2 different dementia medications. No cardiac symptoms. --> We decided to stop rosuvastatin (for concerns of memory issues) reduced standing dose of furosemide to 1/2 tablet daily.  Recent Hospitalizations: None  Reviewed  CV studies:   The following studies were reviewed today: (if available, images/films reviewed: From Epic Chart or Care Everywhere) . None:  Interval History:   Richard Simon returns here today for in person evaluation stating that he has some exertional dyspnea off and on, but really no major complaints.  He gets a little bit lightheaded and dizzy more frequently now than he used to.  He describes having some sensation of being off balance.  Otherwise really has no active cardiac symptoms.  No sensation whatsoever being in A. fib.  Cardiovascular review of symptoms: Cardiovascular ROS: positive for - Mild exertional dyspnea if he overdoes it, lightheadedness and dizziness with poor balance, well-controlled edema. negative for - chest pain, irregular heartbeat, orthopnea, palpitations, paroxysmal nocturnal  dyspnea, rapid heart rate, shortness of breath or Syncope/near syncope despite dizziness, TIA/amaurosis fugax, claudication  The patient does not have symptoms concerning for COVID-19 infection (fever, chills, cough, or new shortness of breath).  The patient is practicing social distancing. ++ Masking.  Does not routinely go out for groceries/shopping.    REVIEWED OF SYSTEMS   A comprehensive ROS was performed. Review of Systems  Constitutional: Positive for weight loss (Just not eating as much). Negative for malaise/fatigue.  HENT: Positive for congestion (Better than in April.  Related allergies). Negative for nosebleeds.   Respiratory: Positive for cough (Sometime in the morning due to congestion). Negative for shortness of breath and wheezing.   Cardiovascular: Negative for leg swelling (Stable/controlled).  Gastrointestinal: Negative for blood in stool and melena.  Genitourinary: Negative for hematuria.  Musculoskeletal: Positive for joint pain (Normal arthritis pains). Negative for falls.  Neurological: Positive for dizziness.       Poor balance, lightheadedness  Endo/Heme/Allergies: Positive for environmental allergies. Does not bruise/bleed easily.  Psychiatric/Behavioral: The patient is not nervous/anxious and does not have insomnia.      I have reviewed and (if needed) personally updated the patient's problem list, medications, allergies, past medical and surgical history, social and family history.   PAST MEDICAL HISTORY   Past Medical History:  Diagnosis Date  . Atrial fibrillation, permanent ([place])    Rate controlled. Anticoagulation with Pradaxa.  . CAD, multiple vessel 2004   S/P 4-vessel CABG in 2004: Pre-TAVR CATH: Occluded OM1 and distal circumflex PRIOR TO GRAFT; 70&80% mid LAD stenosis  prior to LIMA graft, 95% D1 stenosis prior to graft. 4/4 Grafts patent (LIMA-LAD, SVG-DIAG, SVG-OM-OM).    . Dyslipidemia, goal LDL below 70   . Hypertension     very well  controlled  . S/P CABG x 4 2004   Post CABG  . 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 Burt Knack: Oletta Lamas Sapient 3 29 mm biprosthetic valve  . Severe aortic stenosis by prior echocardiogram - s/pTAVR 01/2013; 06/2014   05/2016: Presented with progressively worsening aortic stenosis and referred for TAVR. - Underwent TAVR March 2018     PAST SURGICAL HISTORY   Past Surgical History:  Procedure Laterality Date  . CARDIAC CATHETERIZATION  12/11/2002   Recommendation-CABG; 80% mid LAD, 70% distal LAD. Mid circumflex 95%, OM1 95%. AV groove circumflex 95%. Proximal RCA 100%  . CORONARY ARTERY BYPASS GRAFT  12/15/2002   LIMA-LAD, SVG-diagonal, SVG-OM1-OM2 sequential  [facility] Adam MYOVIEW  December 2012   Fixed basal inferior diaphragmatic attenuation versusinfarct, no ischemia  . MULTIPLE EXTRACTIONS WITH ALVEOLOPLASTY N/A 06/20/2016   Procedure: EXTRACTION of #7, 12, 13, and 15 WITH ALVEOLOPLASTY and gross debridement of teeth;  Surgeon: Lenn Cal, DDS;  Location: [facility];  Service: Oral Surgery;  Laterality: N/A;  . RIGHT HEART CATH AND CORONARY/GRAFT ANGIOGRAPHY N/A 06/13/2016   Procedure: Right Heart Cath and Coronary/Graft Angiography;  Surgeon: Leonie Man, MD;  Location: [facility];  Service: Cardiovascular: Occluded OM1 and distal circumflex PRIOR TO GRAFT; 70&80% mid LAD stenosis prior to LIMA graft, 95% D1 stenosis prior to graft. 4/4 Grafts patent (LIMA-LAD, SVG-DIAG, SVG-OM-OM).   Mild 2 pulmonary hypertension: PCWP 25 mmHg. PA peak/mean 48/23/33 mmH  . TEE WITHOUT CARDIOVERSION N/A 06/22/2016   Procedure: TRANSESOPHAGEAL ECHOCARDIOGRAM (TEE);  Surgeon: Sherren Mocha, MD;  Location: [facility];  Service: Open Heart Surgery;  Laterality: N/A;  . TRANSCATHETER AORTIC VALVE REPLACEMENT, TRANSFEMORAL N/A 06/22/2016   Procedure: TRANSCATHETER AORTIC VALVE REPLACEMENT, TRANSFEMORAL;  Surgeon: Sherren Mocha, MD;  Location: [facility];  Service: Open  Heart Surgery: Oletta Lamas Sapient 3 29 mm biprosthetic walve.  . TRANSTHORACIC ECHOCARDIOGRAM  06/2016   Post TAVR:  EF 40-45% with diffuse hypokinesis. High LV filling pressures. Well-seated Edwards's Sapien 3 TAVR bioprosthesis.  - AVA ~1.5. PA pressures now estimated 43 mmHg.  Marland Kitchen TRANSTHORACIC ECHOCARDIOGRAM  05/2017   Normal LV size.  Mild LVH.  EF 55% with no regional wall motion normality.  Cannot assess diastolic function.  Post TAVR with no significant perivalvular regurgitation and stable gradients.  Moderate MAC.  Mildly fibrotic/calcified (no comment of MR).  Moderate LA and RA dilation.  Moderate RV dilation but with normal function.  . TRANSTHORACIC ECHOCARDIOGRAM  06/2014   EF 60-65%. Mod-Severe AS, AVA~0.69, Mn-Pk gradient 32 mmHg -- 55 mmHg, severe LA dilation, moderate-severe RA dilation: Progression of disease    Diagnostic Diagram 06/13/2016 - pre-TAVR: 4/4 widely patent grafts with severe native CAD (CTO of mid Cx and OM1, 70 and 80% stenosis of the mid LAD and 85% stenosis of D2. PCWP 25 mmHg & PAP 48/23/33 mmHg      MEDICATIONS/ALLERGIES   Current Meds  Medication Sig  . Acetaminophen 500 MG coapsule Take 1 tablet by mouth every 6 (six) hours as needed for pain.   Marland Kitchen amoxicillin (AMOXIL) 500 MG tablet Take 4 tablets by mouth one hour prior to dental appointment  . dabigatran (PRADAXA) 150 MG CAPS capsule Take 1 capsule (150 mg total) by mouth 2 (two) times daily.  Marland Kitchen  diltiazem (CARDIZEM CD) 240 MG 24 hr capsule TAKE 1 CAPSULE EVERY DAY  . donepezil (ARICEPT) 10 MG tablet Take 1 tablet by mouth at bedtime.  . furosemide (LASIX) 40 MG tablet TAKE 1 TABLET EVERY DAY  . memantine (NAMENDA) 10 MG tablet Take 1 tablet by mouth daily.  . Omega-3 Fatty Acids (FISH OIL) 1200 MG CAPS Take 1,200 mg by mouth 2 (two) times daily.  . Potassium Chloride ER 20 MEQ TBCR TAKE 1 TABLET EVERY DAY  . sennosides-docusate sodium (SENOKOT-S) 8.6-50 MG tablet Take 2 tablets by mouth at bedtime.  .  [DISCONTINUED] metoprolol tartrate (LOPRESSOR) 25 MG tablet TAKE 2 TABLETS TWICE DAILY. MAY TAKE AN EXTRA 25MG IF NEEDED FOR PALPITATIONS.    Allergies  Allergen Reactions  . No Known Allergies     SOCIAL HISTORY/FAMILY HISTORY   Social History   Tobacco Use  . Smoking status: Former Smoker    Quit date: 02/12/1975    Years since quitting: 44.0  . Smokeless tobacco: Never Used  Substance Use Topics  . Alcohol use: Yes    Alcohol/week: 14.0 standard drinks    Types: 14 Glasses of wine per week  . Drug use: No   Social History   Social History Narrative   Married. Father of 24, grandfather 52, great-grandfather 3.   Very active, but without routine exercise. Does yard work and intermittent walking, but nothing routine.   Does not smoke. Occasional alcohol.    family history includes Hip fracture in his mother.   OBJCTIVE -PE, EKG, labs   Wt Readings from Last 3 Encounters:  02/06/19 184 lb 8 oz (83.7 kg)  06/26/17 196 lb 9.6 oz (89.2 kg)  05/30/17 196 lb (88.9 kg)    Physical Exam: BP (!) 142/78   Pulse 72   Ht _0  (1.778 m)   Wt 184 lb 8 oz (83.7 kg)   BMI 26.47 kg/m  Physical Exam  Constitutional: He is oriented to person, place, and time. He appears well-developed and well-nourished.  Relatively healthy-appearing elderly gentleman.  Well-groomed.  HENT:  Head: Normocephalic and atraumatic.  Neck: No hepatojugular reflux and no JVD present. Carotid bruit is not present.  Cardiovascular: Normal rate and intact distal pulses. An irregularly irregular rhythm present. PMI is not displaced. Exam reveals no gallop and no friction rub.  Murmur heard. High-pitched harsh crescendo-decrescendo midsystolic murmur is present with a grade of 1/6 at the upper right sternal border radiating to the neck. Pulmonary/Chest: Effort normal and breath sounds normal. No respiratory distress. He has no wheezes. He has no rales.  Abdominal: Soft. Bowel sounds are normal. He  exhibits no distension. There is no abdominal tenderness. There is no rebound.  Musculoskeletal: Normal range of motion.        General: No edema (Trivial).  Neurological: He is alert and oriented to person, place, and time.  Psychiatric: He has a normal mood and affect. His behavior is normal. Judgment and thought content normal.  Somewhat rambling     Adult ECG Report  Rate: 72 ;  Rhythm: atrial fibrillation; otherwise normal axis and intervals durations  Narrative Interpretation: Stable EKG  Recent Labs: October 08, 2018: TC 199, TG 92, LDL 127, HDL 54.  A1c 5.9.  Hgb 15.4.  CR 0.8.  K+ 4.5   ASSESSMENT/PLAN    Problem List Items Addressed This Visit    Multivessel Native CAD - s/p CABGx4: LIMA to LAD, SVG-Diag, seqSVG-OM1-OM2 (Chronic)    Doing well from a  angina standpoint.  No active symptoms.  Just had some exertional dyspnea from deconditioning.  Is on combination of diltiazem and metoprolol because of the A. fib.  Not on aspirin because of Pradaxa.  We have stopped his statin because of memory issues.  At this point we decided to stay off of the statin for quality as opposed to quantity of life.      Relevant Medications   metoprolol succinate (TOPROL XL) 25 MG 24 hr tablet   S/p TAVR (transcatheter aortic valve replacement), bioprosthetic: Dr, Burt Knack (Chronic)    NYHA class I CHF symptoms.  Should be due for follow-up echocardiogram February or March of next year.  Will order prior to 93-monthfollow-up.      Relevant Orders   ECHOCARDIOGRAM COMPLETE   Essential hypertension (Chronic)    Borderline blood pressure today on current dose of beta-blocker and calcium channel blocker.  I am not back down his beta-blocker dose to allow from may be some mild permissive hypertension and better rate responsiveness.  Need to monitor blood pressure as result.      Relevant Medications   metoprolol succinate (TOPROL XL) 25 MG 24 hr tablet   Dyslipidemia, goal LDL below 70 (Chronic)     We stopped his rosuvastatin last visit and I think were fine staying off it.  His LDL is very poorly controlled, but at this point treating more for quality as opposed to quantity of life.      Relevant Medications   metoprolol succinate (TOPROL XL) 25 MG 24 hr tablet   Atrial fibrillation, permanent ([place]; CHA2DS2-VASc Score 4 - on Pradaxa. BB & CCB for RATE control - Primary (Chronic)    Permanent A. fib.  Pretty well rate control.  I am a little worried about him having some dizziness and fatigue issues.  Allowing for mild permissive hypertension on the back off his beta-blocker from 50 twice daily to simply 25 mg daily which he will take the opposite time of day that he takes diltiazem.  Continue Pradaxa      Relevant Medications   metoprolol succinate (TOPROL XL) 25 MG 24 hr tablet   Other Relevant Orders   ECHOCARDIOGRAM COMPLETE   EKG 12-Lead (Completed)   Fatigue due to treatment (Chronic)    He has done better every time we reduce his beta-blocker.  I am to further reduce it now and change to once daily dosing of Toprol.  I may need to increase it if heart rate is not adequately controlled.      Relevant Orders   EKG 12-Lead (Completed)   Chronic diastolic heart failure (HCC) (Chronic)    No really significant heart failure symptoms.  Uses furosemide but not every day. Switching to Toprol from for better stable CHF therapy.  With normal EF, diltiazem acceptable.  Using this as opposed to ARB for better rate control.      Relevant Medications   metoprolol succinate (TOPROL XL) 25 MG 24 hr tablet   Other Relevant Orders   ECHOCARDIOGRAM COMPLETE   EKG 12-Lead (Completed)       COVID-19 Education: The signs and symptoms of COVID-19 were discussed with the patient and how to seek care for testing (follow up with PCP or arrange E-visit).   The importance of social distancing was discussed today.  I spent a total of 252mutes with the patient and chart review. >  50% of  the time was spent in direct patient consultation.  Additional time spent with  chart review (studies, outside notes, etc): 5 Total Time: 30 min   Current medicines are reviewed at length with the patient today.  (+/- concerns) none   Patient Instructions / Medication Changes & Studies & Tests Ordered   Patient Instructions  Medication Instructions:   CHANGE TO TOPROL XL (METOPROLOL SUCCINATE  ) 25 MG  IN THE MORNING  UNTIL MEDICATION TOPROL XL  COME FROM MAIL ORDER -- TAKE  1/2 TABLET OF 25 MG METOPROLOL TARTRATE  TWICE  A DAY  *If you need a refill on your cardiac medications before your next appointment, please call your pharmacy*  Lab Work: NOT NEEDED  Testing/Procedures: WILL BE SCHEDULE AT [place] 2021 Your physician has requested that you have an echocardiogram. Echocardiography is a painless test that uses sound waves to create images of your heart. It provides your doctor with information about the size and shape of your heart and how well your heart's chambers and valves are working. This procedure takes approximately one hour. There are no restrictions for this procedure.    Follow-Up: At [facility], you and your health needs are our priority.  As part of our continuing mission to provide you with exceptional heart care, we have created designated Provider Care Teams.  These Care Teams include your primary Cardiologist (physician) and Advanced Practice Providers (APPs -  Physician Assistants and Nurse Practitioners) who all work together to provide you with the care you need, when you need it.  Your next appointment:   4 months  The format for your next appointment:   In Person  Provider:     Other Instructions    Studies Ordered:   Orders Placed This Encounter  Procedures  . EKG 12-Lead  . ECHOCARDIOGRAM COMPLETE     Glenetta Hew, M.D., M.S. Interventional Cardiologist   Pager # [phone] Phone #  [phone] [address] [place], [place] [address]   Thank you for choosing Heartcare at [facility]!!

## 2019-02-06 NOTE — Patient Instructions (Addendum)
Medication Instructions:   CHANGE TO TOPROL XL (METOPROLOL SUCCINATE  ) 25 MG  IN THE MORNING  UNTIL MEDICATION TOPROL XL  COME FROM MAIL ORDER -- TAKE  1/2 TABLET OF 25 MG METOPROLOL TARTRATE  TWICE  A DAY  *If you need a refill on your cardiac medications before your next appointment, please call your pharmacy*  Lab Work: NOT NEEDED  Testing/Procedures: WILL BE SCHEDULE AT [place] 2021 Your physician has requested that you have an echocardiogram. Echocardiography is a painless test that uses sound waves to create images of your heart. It provides your doctor with information about the size and shape of your heart and how well your heart's chambers and valves are working. This procedure takes approximately one hour. There are no restrictions for this procedure.    Follow-Up: At [facility], you and your health needs are our priority.  As part of our continuing mission to provide you with exceptional heart care, we have created designated Provider Care Teams.  These Care Teams include your primary Cardiologist (physician) and Advanced Practice Providers (APPs -  Physician Assistants and Nurse Practitioners) who all work together to provide you with the care you need, when you need it.  Your next appointment:   4 months  The format for your next appointment:   In Person  Provider:     Other Instructions

## 2019-02-09 ENCOUNTER — Encounter: Payer: Self-pay | Admitting: Cardiology

## 2019-02-09 NOTE — Assessment & Plan Note (Signed)
Doing well from a angina standpoint.  No active symptoms.  Just had some exertional dyspnea from deconditioning.  Is on combination of diltiazem and metoprolol because of the A. fib.  Not on aspirin because of Pradaxa.  We have stopped his statin because of memory issues.  At this point we decided to stay off of the statin for quality as opposed to quantity of life.

## 2019-02-09 NOTE — Assessment & Plan Note (Signed)
We stopped his rosuvastatin last visit and I think were fine staying off it.  His LDL is very poorly controlled, but at this point treating more for quality as opposed to quantity of life.

## 2019-02-09 NOTE — Assessment & Plan Note (Signed)
Borderline blood pressure today on current dose of beta-blocker and calcium channel blocker.  I am not back down his beta-blocker dose to allow from may be some mild permissive hypertension and better rate responsiveness.  Need to monitor blood pressure as result.

## 2019-02-09 NOTE — Assessment & Plan Note (Signed)
No really significant heart failure symptoms.  Uses furosemide but not every day. Switching to Toprol from for better stable CHF therapy.  With normal EF, diltiazem acceptable.  Using this as opposed to ARB for better rate control.

## 2019-02-09 NOTE — Assessment & Plan Note (Signed)
Permanent A. fib.  Pretty well rate control.  I am a little worried about him having some dizziness and fatigue issues.  Allowing for mild permissive hypertension on the back off his beta-blocker from 50 twice daily to simply 25 mg daily which he will take the opposite time of day that he takes diltiazem.  Continue Pradaxa

## 2019-02-09 NOTE — Assessment & Plan Note (Signed)
He has done better every time we reduce his beta-blocker.  I am to further reduce it now and change to once daily dosing of Toprol.  I may need to increase it if heart rate is not adequately controlled.

## 2019-02-09 NOTE — Assessment & Plan Note (Signed)
NYHA class I CHF symptoms.  Should be due for follow-up echocardiogram February or March of next year.  Will order prior to 61-month follow-up.

## 2019-03-12 ENCOUNTER — Other Ambulatory Visit: Payer: Self-pay | Admitting: Cardiology

## 2019-04-07 ENCOUNTER — Other Ambulatory Visit: Payer: Self-pay

## 2019-04-07 MED ORDER — DILTIAZEM HCL ER COATED BEADS 240 MG PO CP24: 240.0000 mg | ORAL_CAPSULE | Freq: Every day | ORAL | 3 refills | Status: DC

## 2019-04-14 ENCOUNTER — Telehealth: Payer: Self-pay | Admitting: Cardiology

## 2019-04-14 MED ORDER — METOPROLOL SUCCINATE ER 25 MG PO TB24: 25.0000 mg | ORAL_TABLET | Freq: Every day | ORAL | 3 refills | Status: DC

## 2019-04-14 NOTE — Telephone Encounter (Signed)
New message   Patient calling to verify med list. Patient states he is unsure what medications were changed and what he should be taking

## 2019-04-14 NOTE — Telephone Encounter (Signed)
Spoke with patient. Patient wants clarification of metoprolol. Confirmed dosage listed on AVS from last appointment.   Patient reports he needs a refill sent to his [facility] for Metoprolol Succinate 25mg . Refill reordered.   Patient verbalized understanding.

## 2019-04-15 ENCOUNTER — Other Ambulatory Visit: Payer: Self-pay

## 2019-04-15 MED ORDER — DILTIAZEM HCL ER COATED BEADS 240 MG PO CP24: 240.0000 mg | ORAL_CAPSULE | Freq: Every day | ORAL | 3 refills | Status: DC

## 2019-04-18 HISTORY — PX: TRANSTHORACIC ECHOCARDIOGRAM: SHX275

## 2019-04-23 ENCOUNTER — Ambulatory Visit (HOSPITAL_COMMUNITY): Payer: Medicare HMO | Attending: Cardiovascular Disease

## 2019-04-23 ENCOUNTER — Other Ambulatory Visit: Payer: Self-pay

## 2019-04-23 DIAGNOSIS — I4821 Permanent atrial fibrillation: Secondary | ICD-10-CM | POA: Diagnosis not present

## 2019-04-23 DIAGNOSIS — Z953 Presence of xenogenic heart valve: Secondary | ICD-10-CM | POA: Insufficient documentation

## 2019-04-23 DIAGNOSIS — I5032 Chronic diastolic (congestive) heart failure: Secondary | ICD-10-CM | POA: Diagnosis not present

## 2019-04-24 DIAGNOSIS — I2510 Atherosclerotic heart disease of native coronary artery without angina pectoris: Secondary | ICD-10-CM | POA: Diagnosis not present

## 2019-04-24 DIAGNOSIS — I5032 Chronic diastolic (congestive) heart failure: Secondary | ICD-10-CM | POA: Diagnosis not present

## 2019-04-24 DIAGNOSIS — F0390 Unspecified dementia without behavioral disturbance: Secondary | ICD-10-CM | POA: Diagnosis not present

## 2019-04-24 DIAGNOSIS — E782 Mixed hyperlipidemia: Secondary | ICD-10-CM | POA: Diagnosis not present

## 2019-05-07 ENCOUNTER — Ambulatory Visit (INDEPENDENT_AMBULATORY_CARE_PROVIDER_SITE_OTHER): Payer: Medicare HMO | Admitting: Cardiology

## 2019-05-07 ENCOUNTER — Other Ambulatory Visit: Payer: Self-pay

## 2019-05-07 VITALS — BP 138/86 | HR 95 | Temp 95.7°F | Ht 73.0 in | Wt 185.6 lb

## 2019-05-07 DIAGNOSIS — I10 Essential (primary) hypertension: Secondary | ICD-10-CM | POA: Diagnosis not present

## 2019-05-07 DIAGNOSIS — Z953 Presence of xenogenic heart valve: Secondary | ICD-10-CM | POA: Diagnosis not present

## 2019-05-07 DIAGNOSIS — I2510 Atherosclerotic heart disease of native coronary artery without angina pectoris: Secondary | ICD-10-CM

## 2019-05-07 DIAGNOSIS — I5032 Chronic diastolic (congestive) heart failure: Secondary | ICD-10-CM | POA: Diagnosis not present

## 2019-05-07 DIAGNOSIS — R5383 Other fatigue: Secondary | ICD-10-CM

## 2019-05-07 DIAGNOSIS — I4821 Permanent atrial fibrillation: Secondary | ICD-10-CM

## 2019-05-07 DIAGNOSIS — E785 Hyperlipidemia, unspecified: Secondary | ICD-10-CM

## 2019-05-07 MED ORDER — FUROSEMIDE 40 MG PO TABS: ORAL_TABLET | ORAL | 1 refills | Status: DC

## 2019-05-07 NOTE — Patient Instructions (Signed)
Medication Instructions:  Your physician has recommended you make the following change in your medication:   DECREASE YOUR FUROSEMIDE (LASIX) TO HALF A TABLET (20 MG) BY MOUTH DAILY FOR ONE MONTH.   IF YOU HAVE NO ISSUES WITH SHORTNESS OF BREATH DURING THIS PERIOD THEN DECREASE YOUR LASIX TO HALF A TABLET (20 MG) BY MOUTH THREE TIMES A WEEK .  *If you need a refill on your cardiac medications before your next appointment, please call your pharmacy*  Lab Work: NONE If you have labs (blood work) drawn today and your tests are completely normal, you will receive your results only by: Marland Kitchen MyChart Message (if you have MyChart) OR . A paper copy in the mail If you have any lab test that is abnormal or we need to change your treatment, we will call you to review the results.  Testing/Procedures: NONE  Follow-Up: At [facility], you and your health needs are our priority.  As part of our continuing mission to provide you with exceptional heart care, we have created designated Provider Care Teams.  These Care Teams include your primary Cardiologist (physician) and Advanced Practice Providers (APPs -  Physician Assistants and Nurse Practitioners) who all work together to provide you with the care you need, when you need it.  Your next appointment:   6 month(s)  The format for your next appointment:   In Person  Provider:    Jory Sims, DNP, ANP

## 2019-05-07 NOTE — Progress Notes (Signed)
Primary Care Provider: Merrilee Seashore, MD Cardiologist: Glenetta Hew, MD Electrophysiologist:   Clinic Note: No chief complaint on file.   HPI:    Richard Simon is a 86 y.o. male with a PMH below who presents today for 4 month f/u.   2004: IschemicCAD s/p CABG (LIMA to LAD, SVG-Diag, SeqSVG-OM1-OM2)    Longstanding, permanent atrial fibrillation -->  Pradaxa., rate controlled.   Severe/critical AS s/p TAVR in February-March 2018 (Drs. Cooper &Bartle)   HTN, HLD, chronic diastolic CHF  Richard Simon was last seen on August 14, 2018 via telemedicine for annual follow-up of CAD-A. fib and history of TAVR.  He had a little bit of a cold with congestion but no fever or chill or coughing..  Had been started on donepezil for dementia.  No current cardiac symptoms.  Recent Hospitalizations: None  Reviewed  CV studies:    The following studies were reviewed today: (if available, images/films reviewed: From Epic Chart or Care Everywhere) . Echo 04/23/2019: EF 60-65.  Normal wall motion with exception of abnormal postoperative septal motion. -- 29 mm Edwards Sapien 3 TAVR. 06/22/2016 w/o paravalvular leak or significant aortic stenosis--normal functioning valve.  Gradients are stable compared to prior echo (mean gradient 8 mmHg).  Mild RV enlargement.  Mild to moderate RA and mild LA enlargement.     Interval History:   Richard Simon presents here today without much of any cardiac symptoms just that he feels a little bit of fatigue and lack of get up and go.  Little bit of positional dizziness.  He also notes having some more memory issues. He denies any significant chest tightness episodes.  Besides having sensation of his lightheadedness and legs get weak when standing, not noticing any syncope or near syncope.  No TIA or amaurosis fugax symptoms. Denies any chest tightness pressure with rest or exertion.  No PND orthopnea.  No edema.  No sensation of rapid irregular heartbeats.  CV  Review of Symptoms (Summary): no chest pain or dyspnea on exertion positive for - Orthostatic dizziness, lightheadedness  without near syncope negative for - edema, irregular heartbeat, orthopnea, palpitations, paroxysmal nocturnal dyspnea, rapid heart rate, shortness of breath or Syncope/near syncope, TIA/amaurosis fugax.  The patient does not have symptoms concerning for COVID-19 infection (fever, chills, cough, or new shortness of breath).  The patient is practicing social distancing  And masking, but is really not going out much.  REVIEWED OF SYSTEMS   A comprehensive ROS was performed. Review of Systems  Constitutional: Positive for malaise/fatigue (Feels little more tired than usual.  This seems to be a normal complaint for him).  HENT: Negative for congestion and nosebleeds.   Respiratory: Negative for shortness of breath and wheezing.   Cardiovascular: Positive for leg swelling (Trivial).  Gastrointestinal: Positive for abdominal pain and nausea. Negative for blood in stool, constipation, heartburn, melena and vomiting.  Genitourinary: Negative for dysuria and hematuria.  Musculoskeletal:       Some left arm tingling and pain  Neurological: Positive for dizziness (Positional), tingling, weakness (More or less mildly reduced strength.) and headaches.  Psychiatric/Behavioral: Positive for memory loss. Negative for depression (More like dysthymia type symptoms). The patient is not nervous/anxious and does not have insomnia (Can have some brief episodes).   All other systems reviewed and are negative.  I have reviewed and (if needed) personally updated the patient's problem list, medications, allergies, past medical and surgical history, social and family history.   PAST MEDICAL HISTORY   Past  Medical History:  Diagnosis Date  . Atrial fibrillation, permanent ([place])    Rate controlled. Anticoagulation with Pradaxa.  . CAD, multiple vessel 2004   S/P 4-vessel CABG in 2004: Pre-TAVR  CATH: Occluded OM1 and distal circumflex PRIOR TO GRAFT; 70&80% mid LAD stenosis prior to LIMA graft, 95% D1 stenosis prior to graft. 4/4 Grafts patent (LIMA-LAD, SVG-DIAG, SVG-OM-OM).    . Dyslipidemia, goal LDL below 70   . Hypertension     very well controlled  . S/P CABG x 4 2004   Post CABG  . 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 Burt Knack: Oletta Lamas Sapient 3 29 mm biprosthetic valve  . Severe aortic stenosis by prior echocardiogram - s/pTAVR 01/2013; 06/2014   05/2016: Presented with progressively worsening aortic stenosis and referred for TAVR. - Underwent TAVR March 2018     PAST SURGICAL HISTORY   Past Surgical History:  Procedure Laterality Date  . CARDIAC CATHETERIZATION  12/11/2002   Recommendation-CABG; 80% mid LAD, 70% distal LAD. Mid circumflex 95%, OM1 95%. AV groove circumflex 95%. Proximal RCA 100%  . CORONARY ARTERY BYPASS GRAFT  12/15/2002   LIMA-LAD, SVG-diagonal, SVG-OM1-OM2 sequential  Carlton Adam MYOVIEW  December 2012   Fixed basal inferior diaphragmatic attenuation versusinfarct, no ischemia  . MULTIPLE EXTRACTIONS WITH ALVEOLOPLASTY N/A 06/20/2016   Procedure: EXTRACTION of #7, 12, 13, and 15 WITH ALVEOLOPLASTY and gross debridement of teeth;  Surgeon: Lenn Cal, DDS;  Location: [place];  Service: Oral Surgery;  Laterality: N/A;  . RIGHT HEART CATH AND CORONARY/GRAFT ANGIOGRAPHY N/A 06/13/2016   Procedure: Right Heart Cath and Coronary/Graft Angiography;  Surgeon: Leonie Man, MD;  Location: [facility];  Service: Cardiovascular: Occluded OM1 and distal circumflex PRIOR TO GRAFT; 70&80% mid LAD stenosis prior to LIMA graft, 95% D1 stenosis prior to graft. 4/4 Grafts patent (LIMA-LAD, SVG-DIAG, SVG-OM-OM).   Mild 2 pulmonary hypertension: PCWP 25 mmHg. PA peak/mean 48/23/33 mmH  . TEE WITHOUT CARDIOVERSION N/A 06/22/2016   Procedure: TRANSESOPHAGEAL ECHOCARDIOGRAM (TEE);  Surgeon: Sherren Mocha, MD;  Location: [place];  Service: Open Heart Surgery;  Laterality: N/A;  . TRANSCATHETER AORTIC VALVE REPLACEMENT, TRANSFEMORAL N/A 06/22/2016   Procedure: TRANSCATHETER AORTIC VALVE REPLACEMENT, TRANSFEMORAL;  Surgeon: Sherren Mocha, MD;  Location: [place];  Service: Open Heart Surgery: Oletta Lamas Sapient 3 29 mm biprosthetic walve.  . TRANSTHORACIC ECHOCARDIOGRAM  06/2016   Post TAVR:  EF 40-45% with diffuse hypokinesis. High LV filling pressures. Well-seated Edwards's Sapien 3 TAVR bioprosthesis.  - AVA ~1.5. PA pressures now estimated 43 mmHg.  Marland Kitchen TRANSTHORACIC ECHOCARDIOGRAM  04/2019   EF 60-65.  Normal wall motion with exception of abnormal postoperative septal motion. -- 29 mm Edwards Sapien 3 TAVR. 06/22/2016 w/o paravalvular leak or significant aortic stenosis--normal functioning valve.  Gradients are stable compared to prior echo (mean gradient 8 mmHg).  Mild RV enlargement.  Mild to moderate RA and mild LA enlargement.    . TRANSTHORACIC ECHOCARDIOGRAM  06/2014   EF 60-65%. Mod-Severe AS, AVA~0.69, Mn-Pk gradient 32 mmHg -- 55 mmHg, severe LA dilation, moderate-severe RA dilation: Progression of disease    Diagnostic Diagram2/27/2018 - pre-TAVR:4/4 widely patent grafts with severe native CAD(CTO of midCxand OM1, 70 and 80% stenosis of the mid LAD and 85% stenosis of D2.PCWP 25 mmHg & PAP 48/23/33 mmHg   MEDICATIONS/ALLERGIES   Current Meds  Medication Sig  . Acetaminophen 500 MG coapsule Take 1 tablet by mouth every 6 (six) hours as needed for pain.   Marland Kitchen  amoxicillin (AMOXIL) 500 MG tablet Take 4 tablets by mouth one hour prior to dental appointment  . dabigatran (PRADAXA) 150 MG CAPS capsule Take 1 capsule (150 mg total) by mouth 2 (two) times daily.  Marland Kitchen diltiazem (CARDIZEM CD) 240 MG 24 hr capsule Take 1 capsule (240 mg total) by mouth daily.  Marland Kitchen donepezil (ARICEPT) 10 MG tablet Take 1 tablet by mouth at bedtime.  . furosemide (LASIX) 40 MG tablet Take half a tablet (20 mg) by mouth daily for one month.  Then, decrease to half a tablet (20 mg) by mouth three times a week.  . memantine (NAMENDA) 10 MG tablet Take 1 tablet by mouth daily.  . metoprolol succinate (TOPROL XL) 25 MG 24 hr tablet Take 1 tablet (25 mg total) by mouth daily.  . Omega-3 Fatty Acids (FISH OIL) 1200 MG CAPS Take 1,200 mg by mouth 2 (two) times daily.  . Potassium Chloride ER 20 MEQ TBCR TAKE 1 TABLET EVERY DAY  . potassium chloride SA (KLOR-CON) 20 MEQ tablet TAKE 1 TABLET EVERY DAY  . sennosides-docusate sodium (SENOKOT-S) 8.6-50 MG tablet Take 2 tablets by mouth at bedtime.  . [DISCONTINUED] furosemide (LASIX) 40 MG tablet TAKE 1 TABLET EVERY DAY    Allergies  Allergen Reactions  . No Known Allergies      SOCIAL HISTORY/FAMILY HISTORY   Social History   Tobacco Use  . Smoking status: Former Smoker    Quit date: 02/12/1975    Years since quitting: 44.2  . Smokeless tobacco: Never Used  Substance Use Topics  . Alcohol use: Yes    Alcohol/week: 14.0 standard drinks    Types: 14 Glasses of wine per week  . Drug use: No   Social History   Social History Narrative   Married. Father of 22, grandfather 19, great-grandfather 3.   Very active, but without routine exercise. Does yard work and intermittent walking, but nothing routine.   Does not smoke. Occasional alcohol.    Family History family history includes Hip fracture in his mother.   OBJCTIVE -PE, EKG, labs   Wt Readings from Last 3 Encounters:  05/07/19 185 lb 9.6 oz (84.2 kg)  02/06/19 184 lb 8 oz (83.7 kg)  06/26/17 196 lb 9.6 oz (89.2 kg)    Physical Exam: BP 138/86   Pulse 95   Temp (!) 95.7 F (35.4 C)   Ht 6\' 1"  (1.854 m)   Wt 185 lb 9.6 oz (84.2 kg)   SpO2 100%   BMI 24.49 kg/m  Physical Exam  Constitutional: He is oriented to person, place, and time. He appears well-developed and well-nourished. No distress.  Relatively pleasant healthy-appearing elderly gentleman.  Well-groomed.  HENT:  Head: Normocephalic and atraumatic.   Neck: No JVD present.  Cardiovascular: Normal rate and S1 normal. An irregularly irregular rhythm present. PMI is not displaced. Exam reveals distant heart sounds, friction rub and decreased pulses. Exam reveals no gallop.  Murmur heard.  Medium-pitched scratchy crescendo-decrescendo midsystolic murmur is present with a grade of 1/6 radiating to the neck. Pulmonary/Chest: Effort normal and breath sounds normal. No respiratory distress. He has no wheezes.  Abdominal: Soft. He exhibits no distension. There is no abdominal tenderness.  Musculoskeletal:     Cervical back: Normal range of motion and neck supple.  Neurological: He is alert and oriented to person, place, and time. No cranial nerve deficit.  Psychiatric:  Somewhat rambling, with tangential answers to questions.  Also tends to ask random questions  Vitals reviewed.  Adult ECG Report n/a  Recent Labs: From June 2020: TC 189, TG 92, HDL 50 D4.  (LDL not available).  Cr 0.8.  Hgb 15.4.  A1c 5.9.   ASSESSMENT/PLAN    Problem List Items Addressed This Visit    Multivessel Native CAD - s/p CABGx4: LIMA to LAD, SVG-Diag, seqSVG-OM1-OM2 - Primary (Chronic)    Despite of multivessel disease with CABG, he seems to be doing fine without any anginal symptoms.  His pre-TAVR cath showed patent grafts with significant native disease.  Plan: Continue on beta-blocker but also with normal EF acceptable be on diltiazem as well serving as antianginal. Not on aspirin or Plavix because of the dabigatran.  Not on statin for reasons stated elsewhere but is on fish oil.  In the absence of symptoms I probably would not further evaluate.      Relevant Medications   furosemide (LASIX) 40 MG tablet   S/p TAVR (transcatheter aortic valve replacement), bioprosthetic: Dr, Burt Knack (Chronic)    NYHA class I heart failure symptoms.  Doing well ever since he had his valve.  Appears to be working well but most recent Echocardiogram done earlier this  month.  I think at this point probably would only recommend annual echo if the TAVR team advises.  Otherwise probably do not need to follow-up for couple years.      Essential hypertension (Chronic)    Blood pressure looks fine today.  Gradually trying to allow for some mild permissive hypertension.  Continue current meds but will reduce furosemide dose in half.      Relevant Medications   furosemide (LASIX) 40 MG tablet   Dyslipidemia, goal LDL below 70 (Chronic)    Stop statin due to age.  He continues to be on fish oil.  At this point I think we do not necessarily need to follow routinely.  He is not really interested in trying lots of new medicines.  Memory has improved or loss has diminished secondary to being off statin.      Relevant Medications   furosemide (LASIX) 40 MG tablet   Atrial fibrillation, permanent ([place]); CHA2DS2-VASc Score 4 - on Pradaxa. BB & CCB for RATE control (Chronic)    Rate controlled permanent A. fib.  Relatively asymptomatic.  Plan Still allowing for permissive hypertension.  I think we probably cannot afford to potentially wean down beta-blocker little more, but for now I think I would like to continue with current dose of beta-blocker and diltiazem.  Is anticoagulated with dabigatran.      Relevant Medications   furosemide (LASIX) 40 MG tablet   Fatigue due to treatment (Chronic)    He does feel better with reduced dose of beta-blocker.  I think just keep some on board because of his CAD and some heart failure symptoms.  We are also using combination of diltiazem and beta-blocker with higher dose of diltiazem now for continued rate control with less fatigue.  I think also plan to reduce Lasix dosing amount and potentially interval in the future would be also beneficial      Chronic diastolic heart failure (HCC) (Chronic)    Seems relatively euvolemic.  I think with him having some dizziness and weakness is standing as well as some fatigue, we  should be able to potentially reduce back to taking 1/2tablet daily and then potentially wean off to every other day.  Marland Kitchen  He is more as a as needed medication at that time.  Were try to allow for mild  permissive hypertension using combination of Toprol and diltiazem because of need for rate control.      Relevant Medications   furosemide (LASIX) 40 MG tablet       COVID-19 Education: The signs and symptoms of COVID-19 were discussed with the patient and how to seek care for testing (follow up with PCP or arrange E-visit).   The importance of social distancing was discussed today.  I spent a total of 22 minutes with the patient. >  50% of the time was spent in direct patient consultation.  Additional time spent with chart review, charting/note (studies, outside notes, etc): 10 Total Time: 32 min   Current medicines are reviewed at length with the patient today.  (+/- concerns) none just not sure if there is a medicine that is making him feel tired and dizzy   Patient Instructions / Medication Changes & Studies & Tests Ordered   Patient Instructions  Medication Instructions:  Your physician has recommended you make the following change in your medication:   DECREASE YOUR FUROSEMIDE (LASIX) TO HALF A TABLET (20 MG) BY MOUTH DAILY FOR ONE MONTH.   IF YOU HAVE NO ISSUES WITH SHORTNESS OF BREATH DURING THIS PERIOD THEN DECREASE YOUR LASIX TO HALF A TABLET (20 MG) BY MOUTH THREE TIMES A WEEK .  *If you need a refill on your cardiac medications before your next appointment, please call your pharmacy*  Lab Work: NONE If you have labs (blood work) drawn today and your tests are completely normal, you will receive your results only by: Marland Kitchen MyChart Message (if you have MyChart) OR . A paper copy in the mail If you have any lab test that is abnormal or we need to change your treatment, we will call you to review the results.  Testing/Procedures: NONE  Follow-Up: At [facility],  you and your health needs are our priority.  As part of our continuing mission to provide you with exceptional heart care, we have created designated Provider Care Teams.  These Care Teams include your primary Cardiologist (physician) and Advanced Practice Providers (APPs -  Physician Assistants and Nurse Practitioners) who all work together to provide you with the care you need, when you need it.  Your next appointment:   6 month(s)  The format for your next appointment:   In Person  Provider:    Jory Sims, DNP, ANP        Studies Ordered:   No orders of the defined types were placed in this encounter.    Glenetta Hew, M.D., M.S. Interventional Cardiologist   Pager # [phone] Phone # [phone] [address] [place], [place] [address]   Thank you for choosing Heartcare at [facility]!!

## 2019-05-09 ENCOUNTER — Encounter: Payer: Self-pay | Admitting: Cardiology

## 2019-05-09 NOTE — Assessment & Plan Note (Signed)
He does feel better with reduced dose of beta-blocker.  I think just keep some on board because of his CAD and some heart failure symptoms.  We are also using combination of diltiazem and beta-blocker with higher dose of diltiazem now for continued rate control with less fatigue.  I think also plan to reduce Lasix dosing amount and potentially interval in the future would be also beneficial

## 2019-05-09 NOTE — Assessment & Plan Note (Addendum)
Stop statin due to age.  He continues to be on fish oil.  At this point I think we do not necessarily need to follow routinely.  He is not really interested in trying lots of new medicines.  Memory has improved or loss has diminished secondary to being off statin.

## 2019-05-09 NOTE — Assessment & Plan Note (Signed)
NYHA class I heart failure symptoms.  Doing well ever since he had his valve.  Appears to be working well but most recent Echocardiogram done earlier this month.  I think at this point probably would only recommend annual echo if the TAVR team advises.  Otherwise probably do not need to follow-up for couple years.

## 2019-05-09 NOTE — Assessment & Plan Note (Signed)
Blood pressure looks fine today.  Gradually trying to allow for some mild permissive hypertension.  Continue current meds but will reduce furosemide dose in half.

## 2019-05-09 NOTE — Assessment & Plan Note (Signed)
Seems relatively euvolemic.  I think with him having some dizziness and weakness is standing as well as some fatigue, we should be able to potentially reduce back to taking 1/2tablet daily and then potentially wean off to every other day.  Marland Kitchen  He is more as a as needed medication at that time.  Were try to allow for mild permissive hypertension using combination of Toprol and diltiazem because of need for rate control.

## 2019-05-09 NOTE — Assessment & Plan Note (Signed)
Despite of multivessel disease with CABG, he seems to be doing fine without any anginal symptoms.  His pre-TAVR cath showed patent grafts with significant native disease.  Plan: Continue on beta-blocker but also with normal EF acceptable be on diltiazem as well serving as antianginal. Not on aspirin or Plavix because of the dabigatran.  Not on statin for reasons stated elsewhere but is on fish oil.  In the absence of symptoms I probably would not further evaluate.

## 2019-05-09 NOTE — Assessment & Plan Note (Signed)
Rate controlled permanent A. fib.  Relatively asymptomatic.  Plan Still allowing for permissive hypertension.  I think we probably cannot afford to potentially wean down beta-blocker little more, but for now I think I would like to continue with current dose of beta-blocker and diltiazem.  Is anticoagulated with dabigatran.

## 2019-07-21 ENCOUNTER — Telehealth: Payer: Self-pay

## 2019-07-21 ENCOUNTER — Other Ambulatory Visit: Payer: Self-pay

## 2019-07-21 MED ORDER — DABIGATRAN ETEXILATE MESYLATE 150 MG PO CAPS: 150.0000 mg | ORAL_CAPSULE | Freq: Two times a day (BID) | ORAL | 3 refills | Status: DC

## 2019-07-21 NOTE — Telephone Encounter (Signed)
Patient came in earlier needing refill for medication sent to his pharmacy.  His mail order will not be here in time, so he asked for it to be sent to local pharmacy. I sent medications to the pharmacy.  But patient came back to office states that his local pharmacy can not get it either, and he wants to be switched to something else until his mail order comes in.  I advised they normally do not do this, but any suggestions should come from MD Patient advised that MD is in clinic today- but we will call him with any recommendations.

## 2019-07-21 NOTE — Telephone Encounter (Signed)
I will give him a new prescription, but if we have samples of either Eliquis or Xarelto, he can use that while he is waiting for his medications to come in  Glenetta Hew, MD

## 2019-07-22 MED ORDER — RIVAROXABAN 20 MG PO TABS: 20.0000 mg | ORAL_TABLET | Freq: Every day | ORAL | 0 refills | Status: DC

## 2019-07-22 NOTE — Telephone Encounter (Signed)
When I looked I did not see we had samples of this medication- will route to Primary nurse to advise of how to proceed.

## 2019-07-22 NOTE — Telephone Encounter (Signed)
Mr Richard Simon by the office today to states he tried to get Pradaxa for local pharmacy but they had to order medication as well. He states he was out and waiting for mail order to come in.  per Dr Ellyn Hack patient can use Xarelto 20 mg  Samples until  Pradaxa  Is available    RN explained to patient that he can use Xarelto  20 mg  Once a day at 6 pm over heaviest meal.  Continue with this medication until he receives  Pradaxa 150 mg -take twice a day , and stop taking Xarelto  Patient verbalized understanding

## 2019-07-28 ENCOUNTER — Telehealth: Payer: Self-pay | Admitting: Cardiology

## 2019-07-28 NOTE — Telephone Encounter (Signed)
Routed to primary nurse 

## 2019-07-28 NOTE — Telephone Encounter (Signed)
Patient states [employer] is waiting to receive a fax from the office for eligibility to distribute  dabigatran (PRADAXA) 150 MG CAPS capsule medication to the patient. Please assist.

## 2019-07-29 ENCOUNTER — Other Ambulatory Visit: Payer: Self-pay | Admitting: *Deleted

## 2019-07-29 MED ORDER — DABIGATRAN ETEXILATE MESYLATE 150 MG PO CAPS: 150.0000 mg | ORAL_CAPSULE | Freq: Two times a day (BID) | ORAL | 3 refills | Status: DC

## 2019-07-31 NOTE — Telephone Encounter (Signed)
SPOKE TO PATIENT ON 07/29/19 . INFORMED HIM  INFORMATION HAS BEEN FAXED  PAGE 4 OF 4  WITH MEDICATION LIST AND RX ATTACHED

## 2019-09-19 ENCOUNTER — Other Ambulatory Visit: Payer: Self-pay | Admitting: Cardiology

## 2019-12-02 ENCOUNTER — Other Ambulatory Visit: Payer: Self-pay

## 2019-12-02 MED ORDER — MEMANTINE HCL 10 MG PO TABS: 10.0000 mg | ORAL_TABLET | Freq: Every day | ORAL | 4 refills | Status: DC

## 2019-12-11 ENCOUNTER — Telehealth: Payer: Self-pay | Admitting: Cardiology

## 2019-12-11 NOTE — Telephone Encounter (Signed)
   Pt would like to speak with Dr. Ellyn Hack or Nurse. He said he have some concerns

## 2019-12-11 NOTE — Telephone Encounter (Signed)
Returned call to patient, he states his son has been summoned to [employer].  He states he is his primary caregiver and is requesting Dr. Ellyn Hack write something for him to state this.  He states Dr. Ellyn Hack knows him and his son very well and if he could do anything to help he would appreciate it.     Routed to MD to review.

## 2019-12-12 NOTE — Telephone Encounter (Signed)
Informed patient that Dr Ellyn Hack will not be able to write a letter to excuse son from jury duty. Patient verbalized understanding.

## 2020-01-14 DIAGNOSIS — I10 Essential (primary) hypertension: Secondary | ICD-10-CM | POA: Diagnosis not present

## 2020-01-14 DIAGNOSIS — I2510 Atherosclerotic heart disease of native coronary artery without angina pectoris: Secondary | ICD-10-CM | POA: Diagnosis not present

## 2020-01-14 DIAGNOSIS — E782 Mixed hyperlipidemia: Secondary | ICD-10-CM | POA: Diagnosis not present

## 2020-01-14 DIAGNOSIS — F0390 Unspecified dementia without behavioral disturbance: Secondary | ICD-10-CM | POA: Diagnosis not present

## 2020-01-14 DIAGNOSIS — I5032 Chronic diastolic (congestive) heart failure: Secondary | ICD-10-CM | POA: Diagnosis not present

## 2020-01-22 DIAGNOSIS — Z0000 Encounter for general adult medical examination without abnormal findings: Secondary | ICD-10-CM | POA: Diagnosis not present

## 2020-01-22 DIAGNOSIS — F0390 Unspecified dementia without behavioral disturbance: Secondary | ICD-10-CM | POA: Diagnosis not present

## 2020-01-22 DIAGNOSIS — I2510 Atherosclerotic heart disease of native coronary artery without angina pectoris: Secondary | ICD-10-CM | POA: Diagnosis not present

## 2020-01-22 DIAGNOSIS — I5032 Chronic diastolic (congestive) heart failure: Secondary | ICD-10-CM | POA: Diagnosis not present

## 2020-01-22 DIAGNOSIS — I10 Essential (primary) hypertension: Secondary | ICD-10-CM | POA: Diagnosis not present

## 2020-01-22 DIAGNOSIS — Z952 Presence of prosthetic heart valve: Secondary | ICD-10-CM | POA: Diagnosis not present

## 2020-01-22 DIAGNOSIS — I4811 Longstanding persistent atrial fibrillation: Secondary | ICD-10-CM | POA: Diagnosis not present

## 2020-01-22 DIAGNOSIS — E782 Mixed hyperlipidemia: Secondary | ICD-10-CM | POA: Diagnosis not present

## 2020-01-22 DIAGNOSIS — R5383 Other fatigue: Secondary | ICD-10-CM | POA: Diagnosis not present

## 2020-03-17 ENCOUNTER — Telehealth: Payer: Self-pay | Admitting: *Deleted

## 2020-03-17 MED ORDER — DABIGATRAN ETEXILATE MESYLATE 150 MG PO CAPS: 150.0000 mg | ORAL_CAPSULE | Freq: Two times a day (BID) | ORAL | 3 refills | Status: DC

## 2020-03-17 NOTE — Telephone Encounter (Signed)
Richard Simon came by the office today . He brought application  For Pradaxa  - patient assistance form to be completed by office. He is applying for assistance for 2022. RN reviewed information after patient left office .   called patient , he will have to come to office to sign  HIPPA authorization portion before the information can be faxed.   forms are ready to be faxed once we receive the patient signature.

## 2020-05-29 ENCOUNTER — Other Ambulatory Visit: Payer: Self-pay | Admitting: Cardiology

## 2020-06-30 NOTE — Telephone Encounter (Signed)
Medication approved

## 2020-08-09 ENCOUNTER — Other Ambulatory Visit: Payer: Self-pay | Admitting: Cardiology

## 2020-08-12 DIAGNOSIS — E782 Mixed hyperlipidemia: Secondary | ICD-10-CM | POA: Diagnosis not present

## 2020-08-12 DIAGNOSIS — I5032 Chronic diastolic (congestive) heart failure: Secondary | ICD-10-CM | POA: Diagnosis not present

## 2020-08-12 DIAGNOSIS — I10 Essential (primary) hypertension: Secondary | ICD-10-CM | POA: Diagnosis not present

## 2020-08-26 DIAGNOSIS — F0390 Unspecified dementia without behavioral disturbance: Secondary | ICD-10-CM | POA: Diagnosis not present

## 2020-08-26 DIAGNOSIS — I5032 Chronic diastolic (congestive) heart failure: Secondary | ICD-10-CM | POA: Diagnosis not present

## 2020-08-26 DIAGNOSIS — I10 Essential (primary) hypertension: Secondary | ICD-10-CM | POA: Diagnosis not present

## 2020-08-26 DIAGNOSIS — Z952 Presence of prosthetic heart valve: Secondary | ICD-10-CM | POA: Diagnosis not present

## 2020-08-26 DIAGNOSIS — Z131 Encounter for screening for diabetes mellitus: Secondary | ICD-10-CM | POA: Diagnosis not present

## 2020-08-26 DIAGNOSIS — I4811 Longstanding persistent atrial fibrillation: Secondary | ICD-10-CM | POA: Diagnosis not present

## 2020-08-26 DIAGNOSIS — E782 Mixed hyperlipidemia: Secondary | ICD-10-CM | POA: Diagnosis not present

## 2020-08-26 DIAGNOSIS — I2510 Atherosclerotic heart disease of native coronary artery without angina pectoris: Secondary | ICD-10-CM | POA: Diagnosis not present

## 2020-09-23 DIAGNOSIS — Z952 Presence of prosthetic heart valve: Secondary | ICD-10-CM | POA: Diagnosis not present

## 2020-09-23 DIAGNOSIS — I4811 Longstanding persistent atrial fibrillation: Secondary | ICD-10-CM | POA: Diagnosis not present

## 2020-09-23 DIAGNOSIS — I2510 Atherosclerotic heart disease of native coronary artery without angina pectoris: Secondary | ICD-10-CM | POA: Diagnosis not present

## 2020-09-23 DIAGNOSIS — I5032 Chronic diastolic (congestive) heart failure: Secondary | ICD-10-CM | POA: Diagnosis not present

## 2020-09-23 DIAGNOSIS — F0390 Unspecified dementia without behavioral disturbance: Secondary | ICD-10-CM | POA: Diagnosis not present

## 2020-09-23 DIAGNOSIS — I10 Essential (primary) hypertension: Secondary | ICD-10-CM | POA: Diagnosis not present

## 2020-09-23 DIAGNOSIS — E782 Mixed hyperlipidemia: Secondary | ICD-10-CM | POA: Diagnosis not present

## 2020-09-23 DIAGNOSIS — D692 Other nonthrombocytopenic purpura: Secondary | ICD-10-CM | POA: Diagnosis not present

## 2020-09-24 ENCOUNTER — Other Ambulatory Visit: Payer: Self-pay | Admitting: Cardiology

## 2021-01-14 DIAGNOSIS — G309 Alzheimer's disease, unspecified: Secondary | ICD-10-CM | POA: Diagnosis not present

## 2021-01-14 DIAGNOSIS — I5032 Chronic diastolic (congestive) heart failure: Secondary | ICD-10-CM | POA: Diagnosis not present

## 2021-01-14 DIAGNOSIS — F0280 Dementia in other diseases classified elsewhere without behavioral disturbance: Secondary | ICD-10-CM | POA: Diagnosis not present

## 2021-01-14 DIAGNOSIS — I110 Hypertensive heart disease with heart failure: Secondary | ICD-10-CM | POA: Diagnosis not present

## 2021-01-27 DIAGNOSIS — E782 Mixed hyperlipidemia: Secondary | ICD-10-CM | POA: Diagnosis not present

## 2021-01-27 DIAGNOSIS — Z0000 Encounter for general adult medical examination without abnormal findings: Secondary | ICD-10-CM | POA: Diagnosis not present

## 2021-01-27 DIAGNOSIS — I10 Essential (primary) hypertension: Secondary | ICD-10-CM | POA: Diagnosis not present

## 2021-01-27 DIAGNOSIS — I5032 Chronic diastolic (congestive) heart failure: Secondary | ICD-10-CM | POA: Diagnosis not present

## 2021-01-31 DIAGNOSIS — Z961 Presence of intraocular lens: Secondary | ICD-10-CM | POA: Diagnosis not present

## 2021-01-31 DIAGNOSIS — H31091 Other chorioretinal scars, right eye: Secondary | ICD-10-CM | POA: Diagnosis not present

## 2021-01-31 DIAGNOSIS — H0589 Other disorders of orbit: Secondary | ICD-10-CM | POA: Diagnosis not present

## 2021-01-31 DIAGNOSIS — H04123 Dry eye syndrome of bilateral lacrimal glands: Secondary | ICD-10-CM | POA: Diagnosis not present

## 2021-01-31 DIAGNOSIS — H2512 Age-related nuclear cataract, left eye: Secondary | ICD-10-CM | POA: Diagnosis not present

## 2021-01-31 DIAGNOSIS — H0520 Unspecified exophthalmos: Secondary | ICD-10-CM | POA: Diagnosis not present

## 2021-02-03 DIAGNOSIS — I4811 Longstanding persistent atrial fibrillation: Secondary | ICD-10-CM | POA: Diagnosis not present

## 2021-02-03 DIAGNOSIS — D692 Other nonthrombocytopenic purpura: Secondary | ICD-10-CM | POA: Diagnosis not present

## 2021-02-03 DIAGNOSIS — Z0000 Encounter for general adult medical examination without abnormal findings: Secondary | ICD-10-CM | POA: Diagnosis not present

## 2021-02-03 DIAGNOSIS — Z23 Encounter for immunization: Secondary | ICD-10-CM | POA: Diagnosis not present

## 2021-02-03 DIAGNOSIS — I130 Hypertensive heart and chronic kidney disease with heart failure and stage 1 through stage 4 chronic kidney disease, or unspecified chronic kidney disease: Secondary | ICD-10-CM | POA: Diagnosis not present

## 2021-02-03 DIAGNOSIS — D6869 Other thrombophilia: Secondary | ICD-10-CM | POA: Diagnosis not present

## 2021-02-03 DIAGNOSIS — N182 Chronic kidney disease, stage 2 (mild): Secondary | ICD-10-CM | POA: Diagnosis not present

## 2021-02-03 DIAGNOSIS — G309 Alzheimer's disease, unspecified: Secondary | ICD-10-CM | POA: Diagnosis not present

## 2021-02-03 DIAGNOSIS — I5032 Chronic diastolic (congestive) heart failure: Secondary | ICD-10-CM | POA: Diagnosis not present

## 2021-02-14 DIAGNOSIS — I5032 Chronic diastolic (congestive) heart failure: Secondary | ICD-10-CM | POA: Diagnosis not present

## 2021-02-14 DIAGNOSIS — I110 Hypertensive heart disease with heart failure: Secondary | ICD-10-CM | POA: Diagnosis not present

## 2021-02-14 DIAGNOSIS — F0280 Dementia in other diseases classified elsewhere without behavioral disturbance: Secondary | ICD-10-CM | POA: Diagnosis not present

## 2021-02-14 DIAGNOSIS — G309 Alzheimer's disease, unspecified: Secondary | ICD-10-CM | POA: Diagnosis not present

## 2021-02-15 DIAGNOSIS — H0589 Other disorders of orbit: Secondary | ICD-10-CM | POA: Diagnosis not present

## 2021-02-15 DIAGNOSIS — H0520 Unspecified exophthalmos: Secondary | ICD-10-CM | POA: Diagnosis not present

## 2021-02-23 ENCOUNTER — Other Ambulatory Visit: Payer: Self-pay | Admitting: Cardiology

## 2021-03-01 DIAGNOSIS — H0589 Other disorders of orbit: Secondary | ICD-10-CM | POA: Diagnosis not present

## 2021-03-17 DIAGNOSIS — H0520 Unspecified exophthalmos: Secondary | ICD-10-CM | POA: Diagnosis not present

## 2021-03-17 DIAGNOSIS — H0589 Other disorders of orbit: Secondary | ICD-10-CM | POA: Diagnosis not present

## 2021-03-17 DIAGNOSIS — D4989 Neoplasm of unspecified behavior of other specified sites: Secondary | ICD-10-CM | POA: Diagnosis not present

## 2021-04-01 ENCOUNTER — Telehealth: Payer: Self-pay | Admitting: *Deleted

## 2021-04-01 MED ORDER — RIVAROXABAN 20 MG PO TABS: 20.0000 mg | ORAL_TABLET | Freq: Every day | ORAL | 0 refills | Status: DC

## 2021-04-01 NOTE — Telephone Encounter (Signed)
Late entry patient -  Patient came to office earlier today . Patient state he is out of his Pradaxa. Patient states he has been receiving medication from patient assistance from company  He states he received some information from the company that the medication would not be available anymore.  He states he went to pharmacy to pick up medication but pharmacist informed him he would need a prescription.  Patient came to office he did not know what to do.  He wanted to know what he should do if he can to get medication.  RN informed patient will discuss with Dr Ellyn Hack on Monday what the next step.  Also, an appointment was made for patient  for Jan 20223. Las visit was 2021. RN discussed with D.O.D Dr Sallyanne Kuster for substitute. Per Dr Sallyanne Kuster check labs, okay to give Xarelto 20 mg  one tablet a day with heaviest meal ( 6 pm)  Direction given to patient , patient verbalized understanding

## 2021-04-04 NOTE — Telephone Encounter (Signed)
Would need to see which one - Eliquis vs. Xarelto is less expensive.  Glenetta Hew, MD

## 2021-04-05 ENCOUNTER — Other Ambulatory Visit: Payer: Self-pay | Admitting: Cardiology

## 2021-04-06 MED ORDER — RIVAROXABAN 20 MG PO TABS: 20.0000 mg | ORAL_TABLET | Freq: Every day | ORAL | 6 refills | Status: DC

## 2021-04-06 MED ORDER — RIVAROXABAN 20 MG PO TABS: 20.0000 mg | ORAL_TABLET | Freq: Every day | ORAL | 0 refills | Status: DC

## 2021-04-06 NOTE — Telephone Encounter (Signed)
Spoke to patient . Patient states he did not contact  insurance to find out which medication was on his medicare hmo plan.  Patient wanted to know if office could supply him with  samples until he was able to find out.  RN informed patient will able to supply 3 weeks worth but a prescription will be sent into pharmacy - ( cost to patient -if need to switch or not)  Patient verbalized understanding and will contact insurance ( Samples given ) as a prescription sent to pharmacy

## 2021-05-11 ENCOUNTER — Ambulatory Visit: Payer: Medicare HMO | Admitting: Cardiology

## 2021-05-16 ENCOUNTER — Telehealth: Payer: Self-pay | Admitting: *Deleted

## 2021-05-16 MED ORDER — DABIGATRAN ETEXILATE MESYLATE 150 MG PO CAPS: 150.0000 mg | ORAL_CAPSULE | Freq: Two times a day (BID) | ORAL | 3 refills | Status: DC

## 2021-05-16 NOTE — Telephone Encounter (Signed)
Patient came into the office. Patient states he has been out of medication for one week.    Patient states he will be unable to get Pradaxa  anymore from patient assistance   He would like to know his options.    Previously discussed patient can stay with  Pradaxa or the generic  150 mg twice a day or can be switch to  Eliquis or Xarelto, per Dr Ellyn Hack. Patient was suppose to find out which medication was more cost effective for him contact the office.  RN called pharmacy  medication cost $95 dollars a month

## 2021-05-16 NOTE — Telephone Encounter (Signed)
CALLED SPOKE TO PATIENT .  Patient aware he can pick medication  generic Pradaxa from [facility] at  [place]  and [address] . Patient is aware of the price.

## 2021-06-20 ENCOUNTER — Other Ambulatory Visit: Payer: Self-pay

## 2021-06-20 MED ORDER — DABIGATRAN ETEXILATE MESYLATE 150 MG PO CAPS: 150.0000 mg | ORAL_CAPSULE | Freq: Two times a day (BID) | ORAL | 3 refills | Status: DC

## 2021-06-20 NOTE — Telephone Encounter (Signed)
Pt walks into office this morning to ask for refill and samples if available. Pt made aware that we do not have sample of pradaxa. Refill sent to pt's pharmacy of choice.  ?

## 2021-08-02 ENCOUNTER — Ambulatory Visit: Payer: Medicare HMO | Admitting: Cardiology

## 2021-08-10 ENCOUNTER — Other Ambulatory Visit: Payer: Self-pay | Admitting: Cardiology

## 2021-09-19 ENCOUNTER — Ambulatory Visit: Payer: Medicare HMO | Admitting: Nurse Practitioner

## 2021-09-21 ENCOUNTER — Other Ambulatory Visit: Payer: Self-pay | Admitting: Cardiology

## 2021-09-27 ENCOUNTER — Other Ambulatory Visit: Payer: Self-pay | Admitting: Cardiology

## 2021-10-06 ENCOUNTER — Ambulatory Visit: Payer: Medicare HMO | Admitting: Nurse Practitioner

## 2021-10-17 ENCOUNTER — Ambulatory Visit: Payer: Medicare HMO | Admitting: Cardiology

## 2021-10-17 NOTE — Progress Notes (Deleted)
Primary Care Provider: Merrilee Seashore, MD Cardiologist: Glenetta Hew, MD Electrophysiologist: None  Clinic Note: No chief complaint on file.   ===================================  ASSESSMENT/PLAN   Problem List Items Addressed This Visit       Cardiology Problems   Multivessel Native CAD - s/p CABGx4: LIMA to LAD, SVG-Diag, seqSVG-OM1-OM2 (Chronic)   Severe aortic stenosis - Primary (Chronic)   Atrial fibrillation, permanent ([place]); CHA2DS2-VASc Score 4 - on Pradaxa. BB & CCB for RATE control (Chronic)   Dyslipidemia, goal LDL below 70 (Chronic)   Essential hypertension (Chronic)   Chronic diastolic heart failure (HCC) (Chronic)     Other   Current use of long term anticoagulation (Chronic)    ===================================  HPI:    Richard Simon is a 88 y.o. male with a PMH below who presents today for ***. Richard Simon is a 88 y.o. male who is being seen today for the evaluation of *** at the request of Merrilee Seashore, MD.  Richard Simon was last seen on ***  Recent Hospitalizations: ***  Reviewed  CV studies:    The following studies were reviewed today: (if available, images/films reviewed: From Epic Chart or Care Everywhere) ***:   Interval History:   Richard Simon   CV Review of Symptoms (Summary) Cardiovascular ROS: {roscv:310661}  REVIEWED OF SYSTEMS   ROS  I have reviewed and (if needed) personally updated the patient's problem list, medications, allergies, past medical and surgical history, social and family history.   PAST MEDICAL HISTORY   Past Medical History:  Diagnosis Date   Atrial fibrillation, permanent ([place])    Rate controlled. Anticoagulation with Pradaxa.   CAD, multiple vessel 2004   S/P 4-vessel CABG in 2004: Pre-TAVR CATH: Occluded OM1 and distal circumflex PRIOR TO GRAFT; 70&80% mid LAD stenosis prior to LIMA graft, 95% D1 stenosis prior to graft. 4/4 Grafts patent (LIMA-LAD, SVG-DIAG, SVG-OM-OM).     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 Burt Knack: Oletta Lamas Sapient 3 29 mm biprosthetic valve   Severe aortic stenosis by prior echocardiogram - s/pTAVR 01/2013; 06/2014   05/2016: Presented with progressively worsening aortic stenosis and referred for TAVR. - Underwent TAVR March 2018    PAST SURGICAL HISTORY   Past Surgical History:  Procedure Laterality Date   CARDIAC CATHETERIZATION  12/11/2002   Recommendation-CABG; 80% mid LAD, 70% distal LAD. Mid circumflex 95%, OM1 95%. AV groove circumflex 95%. Proximal RCA 100%   CORONARY ARTERY BYPASS GRAFT  12/15/2002   LIMA-LAD, SVG-diagonal, SVG-OM1-OM2 sequential   LEXISCAN MYOVIEW  December 2012   Fixed basal inferior diaphragmatic attenuation versusinfarct, no ischemia   MULTIPLE EXTRACTIONS WITH ALVEOLOPLASTY N/A 06/20/2016   Procedure: EXTRACTION of #7, 12, 13, and 15 WITH ALVEOLOPLASTY and gross debridement of teeth;  Surgeon: Lenn Cal, DDS;  Location: [facility];  Service: Oral Surgery;  Laterality: N/A;   RIGHT HEART CATH AND CORONARY/GRAFT ANGIOGRAPHY N/A 06/13/2016   Procedure: Right Heart Cath and Coronary/Graft Angiography;  Surgeon: Leonie Man, MD;  Location: [facility];  Service: Cardiovascular: Occluded OM1 and distal circumflex PRIOR TO GRAFT; 70&80% mid LAD stenosis prior to LIMA graft, 95% D1 stenosis prior to graft. 4/4 Grafts patent (LIMA-LAD, SVG-DIAG, SVG-OM-OM).   Mild 2 pulmonary hypertension: PCWP 25 mmHg. PA peak/mean 48/23/33 mmH   TEE WITHOUT  CARDIOVERSION N/A 06/22/2016   Procedure: TRANSESOPHAGEAL ECHOCARDIOGRAM (TEE);  Surgeon: Sherren Mocha, MD;  Location: [facility];  Service: Open Heart Surgery;  Laterality: N/A;   TRANSCATHETER AORTIC VALVE REPLACEMENT, TRANSFEMORAL N/A 06/22/2016   Procedure: TRANSCATHETER AORTIC VALVE REPLACEMENT, TRANSFEMORAL;  Surgeon: Sherren Mocha, MD;   Location: [facility];  Service: Open Heart Surgery: Oletta Lamas Sapient 3 29 mm biprosthetic walve.   TRANSTHORACIC ECHOCARDIOGRAM  06/2016   Post TAVR:  EF 40-45% with diffuse hypokinesis. High LV filling pressures. Well-seated Edwards's Sapien 3 TAVR bioprosthesis.  - AVA ~1.5. PA pressures now estimated 43 mmHg.   TRANSTHORACIC ECHOCARDIOGRAM  04/2019   EF 60-65.  Normal wall motion with exception of abnormal postoperative septal motion. -- 29 mm Edwards Sapien 3 TAVR. 06/22/2016 w/o paravalvular leak or significant aortic stenosis--normal functioning valve.  Gradients are stable compared to prior echo (mean gradient 8 mmHg).  Mild RV enlargement.  Mild to moderate RA and mild LA enlargement.     TRANSTHORACIC ECHOCARDIOGRAM  06/2014   EF 60-65%. Mod-Severe AS, AVA~0.69, Mn-Pk gradient 32 mmHg -- 55 mmHg, severe LA dilation, moderate-severe RA dilation: Progression of disease    Immunization History  Administered Date(s) Administered   Influenza,inj,Quad PF,6+ Mos 06/09/2016   Pneumococcal Polysaccharide-23 06/09/2016    MEDICATIONS/ALLERGIES   No outpatient medications have been marked as taking for the 10/17/21 encounter (Appointment) with Leonie Man, MD.    Allergies  Allergen Reactions   No Known Allergies     SOCIAL HISTORY/FAMILY HISTORY   Reviewed in Epic:  Pertinent findings:  Social History   Tobacco Use   Smoking status: Former    Types: Cigarettes    Quit date: 02/12/1975    Years since quitting: 46.7   Smokeless tobacco: Never  Vaping Use   Vaping Use: Never used  Substance Use Topics   Alcohol use: Yes    Alcohol/week: 14.0 standard drinks of alcohol    Types: 14 Glasses of wine per week   Drug use: No   Social History   Social History Narrative   Married. Father of 26, grandfather 106, great-grandfather 3.   Very active, but without routine exercise. Does yard work and intermittent walking, but nothing routine.   Does not smoke. Occasional alcohol.     OBJCTIVE -PE, EKG, labs   Wt Readings from Last 3 Encounters:  05/07/19 185 lb 9.6 oz (84.2 kg)  02/06/19 184 lb 8 oz (83.7 kg)  06/26/17 196 lb 9.6 oz (89.2 kg)    Physical Exam: There were no vitals taken for this visit. Physical Exam   Adult ECG Report  Rate: *** ;  Rhythm: {rhythm:17366};   Narrative Interpretation: ***  Recent Labs:  ***  Lab Results  Component Value Date   CHOL 150 12/28/2010   HDL 56 12/28/2010   LDLCALC 85 12/28/2010   TRIG 45 12/28/2010   CHOLHDL 2.7 12/28/2010   Lab Results  Component Value Date   CREATININE 0.8 07/03/2016   BUN 7 07/03/2016   NA 136 (A) 07/03/2016   K 4.5 07/03/2016   CL 99 (L) 06/25/2016   CO2 28 06/25/2016      Latest Ref Rng & Units 07/03/2016   12:00 AM 06/27/2016    2:12 AM 06/26/2016    2:20 AM  CBC  WBC 10^3/mL 6.7     8.0  7.5   Hemoglobin 13.5 - 17.5 g/dL 12.7     10.2  9.9   Hematocrit 41 - 53 % 38  30.6  28.7   Platelets 150 - 399 K/L 263     201  199      This result is from an external source.    Lab Results  Component Value Date   HGBA1C 6.1 (H) 06/22/2016   Lab Results  Component Value Date   TSH 1.319 06/09/2016    ==================================================  COVID-19 Education: The signs and symptoms of COVID-19 were discussed with the patient and how to seek care for testing (follow up with PCP or arrange E-visit).    I spent a total of ***minutes with the patient spent in direct patient consultation.  Additional time spent with chart review  / charting (studies, outside notes, etc): *** min Total Time: *** min  Current medicines are reviewed at length with the patient today.  (+/- concerns) ***  This visit occurred during the SARS-CoV-2 public health emergency.  Safety protocols were in place, including screening questions prior to the visit, additional usage of staff PPE, and extensive cleaning of exam room while observing appropriate contact time as indicated for  disinfecting solutions.  Notice: This dictation was prepared with Dragon dictation along with smart phrase technology. Any transcriptional errors that result from this process are unintentional and may not be corrected upon review.  Studies Ordered:   No orders of the defined types were placed in this encounter.  No orders of the defined types were placed in this encounter.   Patient Instructions / Medication Changes & Studies & Tests Ordered   There are no Patient Instructions on file for this visit.     Glenetta Hew, M.D., M.S. Interventional Cardiologist   Pager # [phone] Phone # [phone] [address] [place], [place] [address]   Thank you for choosing [facility] at [facility]!!

## 2021-10-24 ENCOUNTER — Encounter: Payer: Self-pay | Admitting: Cardiology

## 2021-10-25 ENCOUNTER — Other Ambulatory Visit: Payer: Self-pay | Admitting: Cardiology

## 2021-11-08 ENCOUNTER — Other Ambulatory Visit: Payer: Self-pay | Admitting: Cardiology

## 2021-11-10 ENCOUNTER — Telehealth: Payer: Self-pay | Admitting: Cardiology

## 2021-11-10 NOTE — Telephone Encounter (Signed)
*  STAT* If patient is at the pharmacy, call can be transferred to refill team.   1. Which medications need to be refilled? (please list name of each medication and dose if known)   diltiazem (CARDIZEM CD) 240 MG 24 hr capsule  2. Which pharmacy/location (including street and city if local pharmacy) is medication to be sent to?  [place], [place]   3. Do they need a 30 day or 90 day supply?   90 day

## 2022-01-13 ENCOUNTER — Other Ambulatory Visit: Payer: Self-pay | Admitting: Cardiology

## 2022-01-24 ENCOUNTER — Other Ambulatory Visit: Payer: Self-pay | Admitting: Cardiology

## 2022-01-24 NOTE — Telephone Encounter (Signed)
Prescription refill request for Pradaxa received.  Indication:Afib Last office visit:upcoming Weight:84.2 Age:88 OKP:WXGKM labs CrCl:needs labs  Prescription refilled

## 2022-01-26 ENCOUNTER — Telehealth: Payer: Self-pay | Admitting: *Deleted

## 2022-01-26 MED ORDER — RIVAROXABAN 20 MG PO TABS: 20.0000 mg | ORAL_TABLET | Freq: Every day | ORAL | 0 refills | Status: DC

## 2022-01-26 NOTE — Telephone Encounter (Signed)
Patient walked into the office.patient looked disheveled.  Patient stating he has been out of dabigtran 150 mg twice a day and unable to get the medication from pharmacy.  RN  called [facility]  at [address]  [facility] [address] on 01/24/22   RN  verified with another Therapist, sports . Epic showed  medication was sent to the [facility]  pharmacy on 01/24/22.    Pharmacist stated she can order medication and have it to the pharmacy tomorrow afternoon for patient to pick up a quantity of 60 .  RN  informed patient .  Patient asked wha to do until he gets medication   RN reviewed with [employer]. In the past when patient had issue with obtaining medication , Xarelto  20 mg was used as a substitute .  Okayed to give sample bottle of 7 pilss for patient to take 1 tablet tonight until he receives his Dabigtran 150 mg.  RN  informed patient about substitution  of medication and sample bottle given.patient was able to verbally  give direction on how to take medication.   RN  instructed patient to have his son come to appointment and bring all his bottles of medication he is taking to his next appointment 02/27/22. Patient verbalized understanding.

## 2022-02-27 ENCOUNTER — Ambulatory Visit: Payer: Medicare HMO | Attending: Cardiology | Admitting: Cardiology

## 2022-02-27 ENCOUNTER — Encounter: Payer: Self-pay | Admitting: Cardiology

## 2022-02-27 VITALS — BP 122/78 | HR 110 | Ht 70.0 in | Wt 188.8 lb

## 2022-02-27 DIAGNOSIS — I2510 Atherosclerotic heart disease of native coronary artery without angina pectoris: Secondary | ICD-10-CM

## 2022-02-27 DIAGNOSIS — Z7901 Long term (current) use of anticoagulants: Secondary | ICD-10-CM

## 2022-02-27 DIAGNOSIS — I5032 Chronic diastolic (congestive) heart failure: Secondary | ICD-10-CM

## 2022-02-27 DIAGNOSIS — I4821 Permanent atrial fibrillation: Secondary | ICD-10-CM | POA: Diagnosis not present

## 2022-02-27 DIAGNOSIS — Z953 Presence of xenogenic heart valve: Secondary | ICD-10-CM | POA: Diagnosis not present

## 2022-02-27 DIAGNOSIS — I10 Essential (primary) hypertension: Secondary | ICD-10-CM | POA: Diagnosis not present

## 2022-02-27 NOTE — Progress Notes (Signed)
Primary Care Provider: Merrilee Seashore, [place] Cardiologist: Glenetta Hew, MD Electrophysiologist: None  Clinic Note: Chief Complaint  Patient presents with   Follow-up    22 months since last visit.  Has not had any major complaints.   Coronary Artery Disease    No angina symptoms   Atrial Fibrillation    He does note his heart rate goes up fast when walking, but goes back down appropriately.    ===================================  ASSESSMENT/PLAN   Problem List Items Addressed This Visit       Cardiology Problems   Multivessel Native CAD - s/p CABGx4: LIMA to LAD, SVG-Diag, seqSVG-OM1-OM2 (Chronic)    No active angina symptoms.  But heart rate 112 beats a minute he did not have any chest pain or pressure which essentially has a negative stress test for 40 year old.  He had a cath pre-TAVR which showed stable patent grafts with significant native disease.  The severity of native disease would help ensure that his grafts would probably last longer.  Plan: Continue low-dose Toprol along with diltiazem which is acceptable with preserved EF. No longer taking statin.  He is okay Q10 and fish oil.  We will recheck his lipids to make sure that there not getting significantly worse.  Based on previous discussions, no plans for surveillance stress testing in the absence of symptoms.  With no active symptoms, should have no reason not to have his eye surgery done.      Relevant Orders   EKG 12-Lead (Completed)   ECHOCARDIOGRAM COMPLETE   CBC   Basic metabolic panel   Atrial fibrillation, permanent (HCC); CHA2DS2-VASc Score 4 - on Pradaxa. BB & CCB for RATE control - Primary (Chronic)    Usually pretty well rate controlled.  When I rechecked his pulse his heart rate was more in the high 80s and 90s.  I suspect the EKG was done sure that he walked back.  He denies any real tachycardia symptoms.  Would be reluctant to push his beta-blocker or diltiazem  further.  For now continue 2040 mg diltiazem with low dose-25 mg Toprol.  Remains on Pradaxa which is mostly a financial issue.  Would potentially consider switching to Eliquis or Xarelto at next follow-up pending results of lab work.      Relevant Orders   EKG 12-Lead (Completed)   ECHOCARDIOGRAM COMPLETE   CBC   Basic metabolic panel   Essential hypertension (Chronic)    Blood pressure well controlled following combination of Toprol and diltiazem.      Chronic diastolic heart failure (HCC) (Chronic)    Relatively anemic.  Mild edema but no PND orthopnea.  Would prefer to elevate his legs and wears support socks as opposed to have used Lasix.  He actually has not used it in the last several months.  Not on afterload reduction but he is on combination of diltiazem and low-dose Toprol      Relevant Orders   EKG 12-Lead (Completed)   ECHOCARDIOGRAM COMPLETE     Other   S/p TAVR (transcatheter aortic valve replacement), bioprosthetic: Dr, Burt Knack (Chronic)    NYHA Class I heart failure symptoms.  Besides mild edema no PND or orthopnea noted.  No real exertional dyspnea.  Murmur stable.  He is due for follow-up echo.  Also would need SBE prophylaxis for dental or GI procedures.      Relevant Orders   EKG 12-Lead (Completed)   ECHOCARDIOGRAM COMPLETE   CBC   Basic metabolic panel  Current use of long term anticoagulation (Chronic)    We had tried to convert him to Xarelto last visit he wanted to go back to Pradaxa because of cost issues.  Now that he is finding it difficult to get Pradaxa, we may need to switch back to a newer generation DOAC.      Other Visit Diagnoses     Anticoagulant long-term use       Relevant Orders   CBC   Basic metabolic panel       ===================================  HPI:    Richard Simon is a 88 y.o. male with a PMH below who presents today for almost 2-year follow-up. He returns today at the request of Merrilee Seashore,  MD.  Pertinent PMH:  2004: Ischemic CAD s/p CABG (LIMA to LAD, SVG-Diag, SeqSVG-OM1-OM2)            Longstanding, permanent atrial fibrillation -->  Pradaxa., rate controlled.  Severe/critical AS s/p TAVR in February-March 2018 (Drs. Cooper & Holbrook)  HTN, HLD, chronic diastolic CHF  Richard Simon was last seen on May 07, 2019 for routine follow-up.  Echo showed stable gradients (mean AVG 8 mmHg).  No major cardiac issues.  Limited fatigue and lack of get up and go-feeling like he is getting older with some memory loss.  Also some positional dizziness.  No sense of being in A-fib.  No chest pain or pressure.  No heart failure symptoms. On combination of diltiazem and metoprolol for rate control.  Remains on Pradaxa had been tolerating well. We stopped his statin and reduce his beta-blocker dose due to fatigue issues. Reduce furosemide to 20 mg and then if tolerated once daily, would reduce to first 3 days a week and then PRN  Recent Hospitalizations: None  Reviewed  CV studies:    The following studies were reviewed today: (if available, images/films reviewed: From Epic Chart or Care Everywhere) Has not had an echo since January:  Interval History:   Richard Simon returns here today along with his son.  Clearly, since his wife passed, and he is responsive for doing house chores, he has become less dapper and well dressed.  He is now supporting a beard with long white hair.  Somewhat more disheveled appearing.  However he really is asymptomatic from a cardiac standpoint based basically indicated that he has mild leg swelling but is not really using his Lasix.  His biggest issue is that he is about ready to run out of his Pradaxa.  From a noncardiac standpoint he has left eye exophthalmos with significant swelling and mild redness.  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  This is why he fell he believes he needs to be seen by cardiology.   It does not hurt as much as it makes it somewhat difficult to have depth perception because his eyes are off balance.  It was heart rates 110 beats minute, he does not feel it he says probably goes fast when he gets up walking around doing things but does not really notice it when he is sitting down.  He denies any PND, orthopnea or significant resting or exertional dyspnea to go along with the leg swelling.  No chest pain or pressure with rest or exertion.  No syncope/near syncope or TIA/amaurosis fugax.  No claudication.  REVIEWED OF SYSTEMS   Review of Systems  Constitutional:  Positive for malaise/fatigue (He is a little less  active than he used to be.  Probably has some mild depression since his wife passed.). Negative for weight loss.  HENT:  Negative for congestion.   Eyes:  Positive for double vision (Because of the left eye issue).       Swelling in his left eye pushing his eye out.  Respiratory:  Negative for cough and shortness of breath.   Cardiovascular:  Positive for leg swelling.       Per HPI  Gastrointestinal:  Negative for blood in stool, constipation and melena.  Genitourinary:  Negative for hematuria.  Musculoskeletal:  Negative for joint pain and myalgias.  Neurological:  Positive for dizziness. Negative for focal weakness and headaches.  Psychiatric/Behavioral:  Positive for depression (Anhedonia, change in diet and sleep.) and memory loss. The patient is not nervous/anxious and does not have insomnia.    I have reviewed and (if needed) personally updated the patient's problem list, medications, allergies, past medical and surgical history, social and family history.   PAST MEDICAL HISTORY   Past Medical History:  Diagnosis Date   Atrial fibrillation, permanent ([place])    Rate controlled. Anticoagulation with Pradaxa.   CAD, multiple vessel 2004   S/P 4-vessel CABG in 2004: Pre-TAVR CATH: Occluded OM1 and distal circumflex PRIOR TO GRAFT; 70&80% mid LAD stenosis prior  to LIMA graft, 95% D1 stenosis prior to graft. 4/4 Grafts patent (LIMA-LAD, SVG-DIAG, SVG-OM-OM).     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 Burt Knack: Oletta Lamas Sapient 3 29 mm biprosthetic valve   Severe aortic stenosis by prior echocardiogram - s/pTAVR 01/2013; 06/2014   05/2016: Presented with progressively worsening aortic stenosis and referred for TAVR. - Underwent TAVR March 2018    PAST SURGICAL HISTORY   Past Surgical History:  Procedure Laterality Date   CARDIAC CATHETERIZATION  12/11/2002   Recommendation-CABG; 80% mid LAD, 70% distal LAD. Mid circumflex 95%, OM1 95%. AV groove circumflex 95%. Proximal RCA 100%   CORONARY ARTERY BYPASS GRAFT  12/15/2002   LIMA-LAD, SVG-diagonal, SVG-OM1-OM2 sequential   LEXISCAN MYOVIEW  December 2012   Fixed basal inferior diaphragmatic attenuation versusinfarct, no ischemia   MULTIPLE EXTRACTIONS WITH ALVEOLOPLASTY N/A 06/20/2016   Procedure: EXTRACTION of #7, 12, 13, and 15 WITH ALVEOLOPLASTY and gross debridement of teeth;  Surgeon: Lenn Cal, DDS;  Location: [facility];  Service: Oral Surgery;  Laterality: N/A;   RIGHT HEART CATH AND CORONARY/GRAFT ANGIOGRAPHY N/A 06/13/2016   Procedure: Right Heart Cath and Coronary/Graft Angiography;  Surgeon: Leonie Man, MD;  Location: [facility];  Service: Cardiovascular: Occluded OM1 and distal circumflex PRIOR TO GRAFT; 70&80% mid LAD stenosis prior to LIMA graft, 95% D1 stenosis prior to graft. 4/4 Grafts patent (LIMA-LAD, SVG-DIAG, SVG-OM-OM).   Mild 2 pulmonary hypertension: PCWP 25 mmHg. PA peak/mean 48/23/33 mmH   TEE WITHOUT CARDIOVERSION N/A 06/22/2016   Procedure: TRANSESOPHAGEAL ECHOCARDIOGRAM (TEE);  Surgeon: Sherren Mocha, MD;  Location: [facility];  Service: Open Heart Surgery;  Laterality: N/A;   TRANSCATHETER AORTIC VALVE REPLACEMENT,  TRANSFEMORAL N/A 06/22/2016   Procedure: TRANSCATHETER AORTIC VALVE REPLACEMENT, TRANSFEMORAL;  Surgeon: Sherren Mocha, MD;  Location: [facility];  Service: Open Heart Surgery: Oletta Lamas Sapient 3 29 mm biprosthetic walve.   TRANSTHORACIC ECHOCARDIOGRAM  06/2016   Post TAVR:  EF 40-45% with diffuse hypokinesis. High LV  filling pressures. Well-seated Edwards's Sapien 3 TAVR bioprosthesis.  - AVA ~1.5. PA pressures now estimated 43 mmHg.   TRANSTHORACIC ECHOCARDIOGRAM  04/2019   EF 60-65.  Normal wall motion with exception of abnormal postoperative septal motion. -- 29 mm Edwards Sapien 3 TAVR. 06/22/2016 w/o paravalvular leak or significant aortic stenosis--normal functioning valve.  Gradients are stable compared to prior echo (mean gradient 8 mmHg).  Mild RV enlargement.  Mild to moderate RA and mild LA enlargement.     TRANSTHORACIC ECHOCARDIOGRAM  06/2014   EF 60-65%. Mod-Severe AS, AVA~0.69, Mn-Pk gradient 32 mmHg -- 55 mmHg, severe LA dilation, moderate-severe RA dilation: Progression of disease   Diagnostic Diagram 06/13/2016 - pre-TAVR: 4/4 widely patent grafts with severe native CAD (CTO of mid Cx and OM1, 70 and 80% stenosis of the mid LAD and 85% stenosis of D2. PCWP 25 mmHg & PAP 48/23/33 mmHg    Immunization History  Administered Date(s) Administered   Influenza,inj,Quad PF,6+ Mos 06/09/2016   Pneumococcal Polysaccharide-23 06/09/2016    MEDICATIONS/ALLERGIES   Current Meds  Medication Sig   co-enzyme Q-10 30 MG capsule Take 30 mg by mouth daily.   dabigatran (PRADAXA) 150 MG CAPS capsule TAKE 1 CAPSULE(150 MG) BY MOUTH TWICE DAILY   diltiazem (CARDIZEM CD) 240 MG 24 hr capsule TAKE 1 CAPSULE EVERY DAY (NEED MD APPOINTMENT FOR REFILLS LAST ATTEMPT)   furosemide (LASIX) 40 MG tablet TAKE 1/2 TABLET (20 MG) DAILY FOR ONE MONTH, THEN DECREASE TO 1/2 TABLET (20 MG) THREE TIMES A WEEK. NEED MD APPOINTMENT   memantine (NAMENDA) 10 MG tablet TAKE 1 TABLET EVERY DAY   metoprolol succinate  (TOPROL-XL) 25 MG 24 hr tablet TAKE 1 TABLET EVERY DAY (NEED MD APPOINTMENT FOR REFILLS LAST ATTEMPT)   Omega-3 Fatty Acids (FISH OIL) 1200 MG CAPS Take 1,200 mg by mouth 2 (two) times daily.   Potassium Chloride ER 20 MEQ TBCR TAKE 1 TABLET EVERY DAY   sennosides-docusate sodium (SENOKOT-S) 8.6-50 MG tablet Take 2 tablets by mouth at bedtime.  There is a PRN prescription for amoxicillin 500 mg (4 tabs preprocedure)-this is for SBE prophylaxis.  Allergies  Allergen Reactions   No Known Allergies     SOCIAL HISTORY/FAMILY HISTORY   Reviewed in Epic:  Pertinent findings:  Social History   Tobacco Use   Smoking status: Former    Types: Cigarettes    Quit date: 02/12/1975    Years since quitting: 47.0   Smokeless tobacco: Never  Vaping Use   Vaping Use: Never used  Substance Use Topics   Alcohol use: Yes    Alcohol/week: 14.0 standard drinks of alcohol    Types: 14 Glasses of wine per week   Drug use: No   Social History   Social History Narrative   Married. Father of 76, grandfather 89, great-grandfather 3.   Very active, but without routine exercise. Does yard work and intermittent walking, but nothing routine.   Does not smoke. Occasional alcohol.    OBJCTIVE -PE, EKG, labs   Wt Readings from Last 3 Encounters:  02/27/22 188 lb 12.8 oz (85.6 kg)  05/07/19 185 lb 9.6 oz (84.2 kg)  02/06/19 184 lb 8 oz (83.7 kg)    Physical Exam: BP 122/78 (BP Location: Left Arm, Patient Position: Sitting, Cuff Size: Normal)   Pulse (!) 110   Ht '5\' 10"'$  (1.778 m)   Wt 188 lb 12.8 oz (85.6 kg)   SpO2 99%   BMI 27.09 kg/m  Physical Exam Vitals reviewed.  Constitutional:      General: He is not in acute distress.    Appearance: He is normal weight. He is not ill-appearing or toxic-appearing.     Comments: Somewhat disheveled supporting long white hair and beard.  (Usually has been very well groomed and well dressed).  Now wearing an old dirty shirt.  HENT:     Head: Atraumatic.   Eyes:     Comments: Left eye exophthalmos with mild erythema in the conjunctive a.  No discharge.  Does not seem to be tender.  Cardiovascular:     Rate and Rhythm: Tachycardia present. Rhythm irregular.     Chest Wall: PMI is not displaced.     Pulses: Decreased pulses (Mildly diminished pulses but palpable.  Warm feet).     Heart sounds: S1 normal and S2 normal. Heart sounds are distant. Murmur (1/6 SEM at RUSB) heard.     Comments: Tachycardic, but not under control.  Close to high 90s. Pulmonary:     Effort: Pulmonary effort is normal. No respiratory distress.     Breath sounds: No wheezing, rhonchi or rales.  Chest:     Chest wall: No tenderness.  Abdominal:     General: Abdomen is flat. Bowel sounds are normal. There is no distension.     Palpations: Abdomen is soft. There is no mass.     Tenderness: There is no abdominal tenderness.  Musculoskeletal:        General: Swelling (1+ bilateral) present. Normal range of motion.  Neurological:     General: No focal deficit present.     Mental Status: He is alert and oriented to person, place, and time.  Psychiatric:        Mood and Affect: Mood normal.        Judgment: Judgment normal.     Comments: Somewhat tangential speech with poor memory but seems to be easily redirected by his son.     Adult ECG Report  Rate: 112;  Rhythm: atrial fibrillation and RVR. ;  At least 1 fusion beat.  Low voltage in precordial leads.  Otherwise normal axis, intervals and durations.  Narrative Interpretation: Tachycardic but otherwise stable.  Recent Labs: Reviewed 01/27/2021: TC 238, TG 105, HDL 60, LDL 169.  (No longer on statin) Lab Results  Component Value Date   CHOL 150 12/28/2010   HDL 56 12/28/2010   LDLCALC 85 12/28/2010   TRIG 45 12/28/2010   CHOLHDL 2.7 12/28/2010   Lab Results  Component Value Date   CREATININE 0.8 07/03/2016   BUN 7 07/03/2016   NA 136 (A) 07/03/2016   K 4.5 07/03/2016   CL 99 (L) 06/25/2016   CO2 28  06/25/2016      Latest Ref Rng & Units 07/03/2016   12:00 AM 06/27/2016    2:12 AM 06/26/2016    2:20 AM  CBC  WBC 10^3/mL 6.7     8.0  7.5   Hemoglobin 13.5 - 17.5 g/dL 12.7     10.2  9.9   Hematocrit 41 - 53 % 38     30.6  28.7   Platelets 150 - 399 K/L 263     201  199      This result is from an external source.    Lab Results  Component Value Date   HGBA1C 6.1 (H) 06/22/2016   Lab Results  Component Value Date   TSH 1.319 06/09/2016    ================================================== I spent a total of 23 minutes with  the patient spent in direct patient consultation.  Additional time spent with chart review  / charting (studies, outside notes, etc): 20 min Total Time: 43 min  Current medicines are reviewed at length with the patient today.  (+/- concerns) okay to hold Pradaxa 2 days preop for eye surgery.  Notice: This dictation was prepared with Dragon dictation along with smart phrase technology. Any transcriptional errors that result from this process are unintentional and may not be corrected upon review.  Studies Ordered:   Orders Placed This Encounter  Procedures   CBC   Basic metabolic panel   EKG 55-VZSM   ECHOCARDIOGRAM COMPLETE   No orders of the defined types were placed in this encounter.   Patient Instructions / Medication Changes & Studies & Tests Ordered   Patient Instructions  Medication Instructions:  No changes    *If you need a refill on your cardiac medications before your next appointment, please call your pharmacy*   Lab Work: Not needed    Testing/Procedures: Will be schedule at [facility] has requested that you have an echocardiogram. Echocardiography is a painless test that uses sound waves to create images of your heart. It provides your doctor with information about the size and shape of your heart and how well your heart's chambers and valves are working. This procedure takes  approximately one hour. There are no restrictions for this procedure. Please do NOT wear cologne, perfume, aftershave, or lotions (deodorant is allowed). Please arrive 15 minutes prior to your appointment time.    Follow-Up: At [facility], you and your health needs are our priority.  As part of our continuing mission to provide you with exceptional heart care, we have created designated Provider Care Teams.  These Care Teams include your primary Cardiologist (physician) and Advanced Practice Providers (APPs -  Physician Assistants and Nurse Practitioners) who all work together to provide you with the care you need, when you need it.  We recommend signing up for the patient portal called "MyChart".  Sign up information is provided on this After Visit Summary.  MyChart is used to connect with patients for Virtual Visits (Telemedicine).  Patients are able to view lab/test results, encounter notes, upcoming appointments, etc.  Non-urgent messages can be sent to your provider as well.   To learn more about what you can do with MyChart, go to [email or web address].    Your next appointment:   5 month(s)  The format for your next appointment:   In Person  Provider:   Coletta Memos, FNP    Then, Glenetta Hew, MD will plan to see you again in 12 month(s).   Other Instructions  You can  Hold  Pradaxa  ( dabigatran ) -  3 days  before  and 3 days after procedure  for your eye     Leonie Man, MD, MS Glenetta Hew, M.D., M.S. Interventional Cardiologist  [place]  Pager # [phone] Phone # [phone] [address] [place], [place] [address]   Thank you for choosing [facility]!!

## 2022-02-27 NOTE — Patient Instructions (Addendum)
Medication Instructions:  No changes    *If you need a refill on your cardiac medications before your next appointment, please call your pharmacy*   Lab Work: Not needed    Testing/Procedures: Will be schedule at [place] [facility] has requested that you have an echocardiogram. Echocardiography is a painless test that uses sound waves to create images of your heart. It provides your doctor with information about the size and shape of your heart and how well your heart's chambers and valves are working. This procedure takes approximately one hour. There are no restrictions for this procedure. Please do NOT wear cologne, perfume, aftershave, or lotions (deodorant is allowed). Please arrive 15 minutes prior to your appointment time.    Follow-Up: At [facility], you and your health needs are our priority.  As part of our continuing mission to provide you with exceptional heart care, we have created designated Provider Care Teams.  These Care Teams include your primary Cardiologist (physician) and Advanced Practice Providers (APPs -  Physician Assistants and Nurse Practitioners) who all work together to provide you with the care you need, when you need it.  We recommend signing up for the patient portal called "MyChart".  Sign up information is provided on this After Visit Summary.  MyChart is used to connect with patients for Virtual Visits (Telemedicine).  Patients are able to view lab/test results, encounter notes, upcoming appointments, etc.  Non-urgent messages can be sent to your provider as well.   To learn more about what you can do with MyChart, go to [email or web address].    Your next appointment:   5 month(s)  The format for your next appointment:   In Person  Provider:   Coletta Memos, FNP    Then, Glenetta Hew, MD will plan to see you again in 12 month(s).   Other Instructions  You can  Hold  Pradaxa  ( dabigatran ) -  3 days  before   and 3 days after procedure  for your eye

## 2022-03-04 ENCOUNTER — Encounter: Payer: Self-pay | Admitting: Cardiology

## 2022-03-04 NOTE — Assessment & Plan Note (Signed)
Relatively anemic.  Mild edema but no PND orthopnea.  Would prefer to elevate his legs and wears support socks as opposed to have used Lasix.  He actually has not used it in the last several months.  Not on afterload reduction but he is on combination of diltiazem and low-dose Toprol

## 2022-03-04 NOTE — Assessment & Plan Note (Signed)
We had tried to convert him to Xarelto last visit he wanted to go back to Pradaxa because of cost issues.  Now that he is finding it difficult to get Pradaxa, we may need to switch back to a newer generation DOAC.

## 2022-03-04 NOTE — Assessment & Plan Note (Addendum)
No active angina symptoms.  But heart rate 112 beats a minute he did not have any chest pain or pressure which essentially has a negative stress test for 88 year old.  He had a cath pre-TAVR which showed stable patent grafts with significant native disease.  The severity of native disease would help ensure that his grafts would probably last longer.  Plan: Continue low-dose Toprol along with diltiazem which is acceptable with preserved EF. No longer taking statin.  He is okay Q10 and fish oil.  We will recheck his lipids to make sure that there not getting significantly worse.  Based on previous discussions, no plans for surveillance stress testing in the absence of symptoms.  With no active symptoms, should have no reason not to have his eye surgery done.

## 2022-03-04 NOTE — Assessment & Plan Note (Signed)
NYHA Class I heart failure symptoms.  Besides mild edema no PND or orthopnea noted.  No real exertional dyspnea.  Murmur stable.  He is due for follow-up echo.  Also would need SBE prophylaxis for dental or GI procedures.

## 2022-03-04 NOTE — Assessment & Plan Note (Signed)
Usually pretty well rate controlled.  When I rechecked his pulse his heart rate was more in the high 80s and 90s.  I suspect the EKG was done sure that he walked back.  He denies any real tachycardia symptoms.  Would be reluctant to push his beta-blocker or diltiazem further.  For now continue 2040 mg diltiazem with low dose-25 mg Toprol.  Remains on Pradaxa which is mostly a financial issue.  Would potentially consider switching to Eliquis or Xarelto at next follow-up pending results of lab work.

## 2022-03-04 NOTE — Assessment & Plan Note (Signed)
Blood pressure well controlled following combination of Toprol and diltiazem.

## 2022-03-15 DIAGNOSIS — R5383 Other fatigue: Secondary | ICD-10-CM | POA: Diagnosis not present

## 2022-03-15 DIAGNOSIS — I10 Essential (primary) hypertension: Secondary | ICD-10-CM | POA: Diagnosis not present

## 2022-03-15 DIAGNOSIS — R7303 Prediabetes: Secondary | ICD-10-CM | POA: Diagnosis not present

## 2022-03-15 DIAGNOSIS — E782 Mixed hyperlipidemia: Secondary | ICD-10-CM | POA: Diagnosis not present

## 2022-03-22 DIAGNOSIS — D6869 Other thrombophilia: Secondary | ICD-10-CM | POA: Diagnosis not present

## 2022-03-22 DIAGNOSIS — Z0000 Encounter for general adult medical examination without abnormal findings: Secondary | ICD-10-CM | POA: Diagnosis not present

## 2022-03-22 DIAGNOSIS — I130 Hypertensive heart and chronic kidney disease with heart failure and stage 1 through stage 4 chronic kidney disease, or unspecified chronic kidney disease: Secondary | ICD-10-CM | POA: Diagnosis not present

## 2022-03-22 DIAGNOSIS — I5032 Chronic diastolic (congestive) heart failure: Secondary | ICD-10-CM | POA: Diagnosis not present

## 2022-03-22 DIAGNOSIS — F02B0 Dementia in other diseases classified elsewhere, moderate, without behavioral disturbance, psychotic disturbance, mood disturbance, and anxiety: Secondary | ICD-10-CM | POA: Diagnosis not present

## 2022-03-22 DIAGNOSIS — I4819 Other persistent atrial fibrillation: Secondary | ICD-10-CM | POA: Diagnosis not present

## 2022-03-22 DIAGNOSIS — N182 Chronic kidney disease, stage 2 (mild): Secondary | ICD-10-CM | POA: Diagnosis not present

## 2022-03-22 DIAGNOSIS — Z23 Encounter for immunization: Secondary | ICD-10-CM | POA: Diagnosis not present

## 2022-03-22 DIAGNOSIS — D692 Other nonthrombocytopenic purpura: Secondary | ICD-10-CM | POA: Diagnosis not present

## 2022-03-23 ENCOUNTER — Ambulatory Visit (HOSPITAL_COMMUNITY): Payer: Medicare HMO | Attending: Cardiology

## 2022-03-23 DIAGNOSIS — Z953 Presence of xenogenic heart valve: Secondary | ICD-10-CM | POA: Diagnosis not present

## 2022-03-23 DIAGNOSIS — I2510 Atherosclerotic heart disease of native coronary artery without angina pectoris: Secondary | ICD-10-CM | POA: Diagnosis not present

## 2022-03-23 DIAGNOSIS — I5032 Chronic diastolic (congestive) heart failure: Secondary | ICD-10-CM

## 2022-03-23 DIAGNOSIS — I4821 Permanent atrial fibrillation: Secondary | ICD-10-CM | POA: Diagnosis not present

## 2022-03-23 LAB — ECHOCARDIOGRAM COMPLETE
AR max vel: 2.05 cm2
AV Area VTI: 1.86 cm2
AV Area mean vel: 2.03 cm2
AV Mean grad: 7 mmHg
AV Peak grad: 11.3 mmHg
Ao pk vel: 1.68 m/s
Area-P 1/2: 4.32 cm2
MV M vel: 4.7 m/s
MV Peak grad: 88.4 mmHg
S' Lateral: 3.5 cm

## 2022-04-21 ENCOUNTER — Other Ambulatory Visit: Payer: Self-pay | Admitting: Cardiology

## 2022-06-07 ENCOUNTER — Other Ambulatory Visit: Payer: Self-pay | Admitting: Cardiology

## 2022-06-15 DIAGNOSIS — G309 Alzheimer's disease, unspecified: Secondary | ICD-10-CM | POA: Diagnosis not present

## 2022-06-15 DIAGNOSIS — F0280 Dementia in other diseases classified elsewhere without behavioral disturbance: Secondary | ICD-10-CM | POA: Diagnosis not present

## 2022-06-15 DIAGNOSIS — I110 Hypertensive heart disease with heart failure: Secondary | ICD-10-CM | POA: Diagnosis not present

## 2022-06-15 DIAGNOSIS — I5032 Chronic diastolic (congestive) heart failure: Secondary | ICD-10-CM | POA: Diagnosis not present

## 2022-07-28 NOTE — Progress Notes (Deleted)
Cardiology Clinic Note   Patient Name: Richard Simon Date of Encounter: 07/28/2022  Primary Care Provider:  Georgianne Fick, MD Primary Cardiologist:  Bryan Lemma, MD  Patient Profile    Richard Simon 89 year old male presents the clinic today for follow-up evaluation of his coronary artery disease status post CABG x 4, HTN, HLD, chronic diastolic CHF.  Past Medical History    Past Medical History:  Diagnosis Date   Atrial fibrillation, permanent (HCC)    Rate controlled. Anticoagulation with Pradaxa.   CAD, multiple vessel 2004   S/P 4-vessel CABG in 2004: Pre-TAVR CATH: Occluded OM1 and distal circumflex PRIOR TO GRAFT; 70&80% mid LAD stenosis prior to LIMA graft, 95% D1 stenosis prior to graft. 4/4 Grafts patent (LIMA-LAD, SVG-DIAG, SVG-OM-OM).     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 Excell Seltzer: [name] 3 29 mm biprosthetic valve   Severe aortic stenosis by prior echocardiogram - s/pTAVR 01/2013; 06/2014   05/2016: Presented with progressively worsening aortic stenosis and referred for TAVR. - Underwent TAVR March 2018   Past Surgical History:  Procedure Laterality Date   CARDIAC CATHETERIZATION  12/11/2002   Recommendation-CABG; 80% mid LAD, 70% distal LAD. Mid circumflex 95%, OM1 95%. AV groove circumflex 95%. Proximal RCA 100%   CORONARY ARTERY BYPASS GRAFT  12/15/2002   LIMA-LAD, SVG-diagonal, SVG-OM1-OM2 sequential   LEXISCAN MYOVIEW  December 2012   Fixed basal inferior diaphragmatic attenuation versusinfarct, no ischemia   MULTIPLE EXTRACTIONS WITH ALVEOLOPLASTY N/A 06/20/2016   Procedure: EXTRACTION of #7, 12, 13, and 15 WITH ALVEOLOPLASTY and gross debridement of teeth;  Surgeon: Charlynne Pander, DDS;  Location: [facility] OR;  Service: Oral Surgery;  Laterality: N/A;   RIGHT HEART CATH AND CORONARY/GRAFT ANGIOGRAPHY  N/A 06/13/2016   Procedure: Right Heart Cath and Coronary/Graft Angiography;  Surgeon: Marykay Lex, MD;  Location: [facility] INVASIVE CV LAB;  Service: Cardiovascular: Occluded OM1 and distal circumflex PRIOR TO GRAFT; 70&80% mid LAD stenosis prior to LIMA graft, 95% D1 stenosis prior to graft. 4/4 Grafts patent (LIMA-LAD, SVG-DIAG, SVG-OM-OM).   Mild 2 pulmonary hypertension: PCWP 25 mmHg. PA peak/mean 48/23/33 mmH   TEE WITHOUT CARDIOVERSION N/A 06/22/2016   Procedure: TRANSESOPHAGEAL ECHOCARDIOGRAM (TEE);  Surgeon: Tonny Bollman, MD;  Location: [facility] OR;  Service: Open Heart Surgery;  Laterality: N/A;   TRANSCATHETER AORTIC VALVE REPLACEMENT, TRANSFEMORAL N/A 06/22/2016   Procedure: TRANSCATHETER AORTIC VALVE REPLACEMENT, TRANSFEMORAL;  Surgeon: Tonny Bollman, MD;  Location: [facility] OR;  Service: Open Heart Surgery: [name] 3 29 mm biprosthetic walve.   TRANSTHORACIC ECHOCARDIOGRAM  06/2016   Post TAVR:  EF 40-45% with diffuse hypokinesis. High LV filling pressures. Well-seated Edwards's Sapien 3 TAVR bioprosthesis.  - AVA ~1.5. PA pressures now estimated 43 mmHg.   TRANSTHORACIC ECHOCARDIOGRAM  04/2019   EF 60-65.  Normal wall motion with exception of abnormal postoperative septal motion. -- 29 mm Edwards Sapien 3 TAVR. 06/22/2016 w/o paravalvular leak or significant aortic stenosis--normal functioning valve.  Gradients are stable compared to prior echo (mean gradient 8 mmHg).  Mild RV enlargement.  Mild to moderate RA and mild LA enlargement.     TRANSTHORACIC ECHOCARDIOGRAM  06/2014   EF 60-65%. Mod-Severe AS, AVA~0.69, Mn-Pk gradient 32 mmHg -- 55 mmHg, severe LA dilation, moderate-severe RA dilation: Progression of disease    Allergies  Allergies  Allergen Reactions   No Known Allergies     History of Present Illness    Richard Simon has a PMH of essential hypertension, atrial fibrillation CHA2DS2-VASc score 4 on Pradaxa, chronic diastolic CHF, acute encephalopathy, dyslipidemia, is status post  TAVR, and has coronary artery disease status post CABG x 4 (LIMA-LAD, SVG-diagonal, SVG-OM1-OM 2).  He was seen in follow-up by Dr. Herbie Baltimore on 02/27/2022.  He presented with his son.  His wife passed away.  He was less dapper and well-dressed.  He presented with a beard and long white hair.  He remains stable from a cardiac standpoint and remained asymptomatic.  He was noted to have mild lower extremity swelling.  He was not really using his furosemide.  His biggest concern was his Pradaxa getting close to running out.  He was noted to have a left eye exophthalmos with swelling and mild redness.  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  He reported double vision.  He felt like that is why he needs to be seen by cardiology.  He denied pain.  He did note that his depth perception was off.  His heart rate was noted to be 110s beats per minute.  It was felt his heart rate was elevated due to increased physical activity.  He did not notice his heart rate being fast when he was sitting down.  He denied PND, orthopnea, exertional dyspnea and chest pain.  He denied claudication.  Follow-up echocardiogram 03/23/2022 showed normal LVEF and intermediate diastolic parameters with moderately dilated left atria, mildly dilated right atria, trivial mitral valve regurgitation, and well-functioning TAVR valve.  He presents to the clinic today for follow-up evaluation and states***.  *** denies chest pain, shortness of breath, lower extremity edema, fatigue, palpitations, melena, hematuria, hemoptysis, diaphoresis, weakness, presyncope, syncope, orthopnea, and PND.  Atrial fibrillation-heart rate today***.  CHA2DS2-VASc score 4.  Compliant with Pradaxa.  Denies bleeding issues.  Denies recent falls. Continue Pradaxa, beta-blocker therapy Avoid triggers caffeine, chocolate, EtOH, dehydration etc.  Status post TAVR-physical activity stable, no increased shortness of  breath..  Recent echocardiogram showed normal EF, intermediate diastolic parameters, trivial mitral valve regurgitation and well-functioning TAVR. Follows with structural heart team  Essential hypertension-BP today***. Continue current medical therapy Heart healthy low-sodium diet  Chronic diastolic CHF-euvolemic.  NYHA class II-3.  Recent echocardiogram reassuring. Lower extremity support stockings Heart healthy low-sodium diet Continue metoprolol, diltiazem, furosemide, potassium Order BMP  Coronary artery disease-status post CABG x 4.  Denies recent episodes of arm neck back or chest discomfort. Continue metoprolol, omega-3 fatty acids Increase physical activity as tolerated  Disposition: Follow-up with Dr. Herbie Baltimore or me in 6 months. Home Medications    Prior to Admission medications   Medication Sig Start Date End Date Taking? Authorizing Provider  Acetaminophen 500 MG coapsule Take 1 tablet by mouth every 6 (six) hours as needed for pain.  Patient not taking: Reported on 02/27/2022 05/18/14   [provider]  amoxicillin (AMOXIL) 500 MG tablet Take 4 tablets by mouth one hour prior to dental appointment Patient not taking: Reported on 02/27/2022 08/14/18   Marykay Lex, MD  co-enzyme Q-10 30 MG capsule Take 30 mg by mouth daily.    [provider]  dabigatran (PRADAXA) 150 MG CAPS capsule TAKE 1 CAPSULE(150 MG) BY MOUTH TWICE DAILY 04/24/22   Marykay Lex, MD  diltiazem (CARDIZEM CD) 240 MG 24 hr capsule TAKE  1 CAPSULE EVERY DAY (NEED MD APPOINTMENT FOR REFILLS LAST ATTEMPT) 01/13/22   Marykay Lex, MD  donepezil (ARICEPT) 10 MG tablet Take 1 tablet by mouth at bedtime. Patient not taking: Reported on 02/27/2022 01/20/19   [provider]  furosemide (LASIX) 40 MG tablet TAKE 1/2 TABLET (20 MG) DAILY FOR ONE MONTH, THEN DECREASE TO 1/2 TABLET (20 MG) THREE TIMES A WEEK. NEED MD APPOINTMENT 11/08/21   Marykay Lex, MD  memantine [facility]) 10 MG  tablet TAKE 1 TABLET EVERY DAY 01/13/22   Marykay Lex, MD  metoprolol succinate (TOPROL-XL) 25 MG 24 hr tablet Take 1 tablet (25 mg total) by mouth daily. 06/07/22   Marykay Lex, MD  Omega-3 Fatty Acids (FISH OIL) 1200 MG CAPS Take 1,200 mg by mouth 2 (two) times daily.    [provider]  Potassium Chloride ER 20 MEQ TBCR TAKE 1 TABLET EVERY DAY 09/07/16   Marykay Lex, MD  potassium chloride SA (KLOR-CON M) 20 MEQ tablet Take 1 tablet (20 mEq total) by mouth daily for 180 doses. 06/07/22 12/04/22  Marykay Lex, MD  sennosides-docusate sodium (SENOKOT-S) 8.6-50 MG tablet Take 2 tablets by mouth at bedtime.    [provider]    Family History    Family History  Problem Relation Age of Onset   Hip fracture Mother    He indicated that his mother is deceased. He indicated that his father is deceased. He indicated that his maternal grandmother is deceased. He indicated that his maternal grandfather is deceased. He indicated that his paternal grandmother is deceased. He indicated that his paternal grandfather is deceased.  Social History    Social History   Socioeconomic History   Marital status: Married    Spouse name: Not on file   Number of children: Not on file   Years of education: Not on file   Highest education level: Not on file  Occupational History   Not on file  Tobacco Use   Smoking status: Former    Types: Cigarettes    Quit date: 02/12/1975    Years since quitting: 47.4   Smokeless tobacco: Never  Vaping Use   Vaping Use: Never used  Substance and Sexual Activity   Alcohol use: Yes    Alcohol/week: 14.0 standard drinks of alcohol    Types: 14 Glasses of wine per week   Drug use: No   Sexual activity: Not on file  Other Topics Concern   Not on file  Social History Narrative   Married. Father of 5, grandfather 66, great-grandfather 3.   Very active, but without routine exercise. Does yard work and intermittent walking, but nothing  routine.   Does not smoke. Occasional alcohol.   Social Determinants of Health   Financial Resource Strain: Not on file  Food Insecurity: Not on file  Transportation Needs: Not on file  Physical Activity: Not on file  Stress: Not on file  Social Connections: Not on file  Intimate Partner Violence: Not on file     Review of Systems    General:  No chills, fever, night sweats or weight changes.  Cardiovascular:  No chest pain, dyspnea on exertion, edema, orthopnea, palpitations, paroxysmal nocturnal dyspnea. Dermatological: No rash, lesions/masses Respiratory: No cough, dyspnea Urologic: No hematuria, dysuria Abdominal:   No nausea, vomiting, diarrhea, bright red blood per rectum, melena, or hematemesis Neurologic:  No visual changes, wkns, changes in mental status. All other systems reviewed and are otherwise negative  except as noted above.  Physical Exam    VS:  There were no vitals taken for this visit. , BMI There is no height or weight on file to calculate BMI. GEN: Well nourished, well developed, in no acute distress. HEENT: normal. Neck: Supple, no JVD, carotid bruits, or masses. Cardiac: RRR, no murmurs, rubs, or gallops. No clubbing, cyanosis, edema.  Radials/DP/PT 2+ and equal bilaterally.  Respiratory:  Respirations regular and unlabored, clear to auscultation bilaterally. GI: Soft, nontender, nondistended, BS + x 4. MS: no deformity or atrophy. Skin: warm and dry, no rash. Neuro:  Strength and sensation are intact. Psych: Normal affect.  Accessory Clinical Findings    Recent Labs: No results found for requested labs within last 365 days.   Recent Lipid Panel    Component Value Date/Time   CHOL 150 12/28/2010 0645   TRIG 45 12/28/2010 0645   HDL 56 12/28/2010 0645   CHOLHDL 2.7 12/28/2010 0645   VLDL 9 12/28/2010 0645   LDLCALC 85 12/28/2010 0645    No BP recorded.  {Refresh Note OR Click here to enter BP  :1}***    ECG personally reviewed by me  today- *** - No acute changes  Assessment & Plan   1.  ***   Thomasene Ripple. Vidal Lampkins NP-C     07/28/2022, 7:29 AM [facility] [address] Office [phone] Fax [phone]    I spent***minutes examining this patient, reviewing medications, and using patient centered shared decision making involving her cardiac care.  Prior to her visit I spent greater than 20 minutes reviewing her past medical history,  medications, and prior cardiac tests.

## 2022-08-01 ENCOUNTER — Ambulatory Visit: Payer: Medicare HMO | Attending: General Practice | Admitting: General Practice

## 2022-08-06 ENCOUNTER — Other Ambulatory Visit: Payer: Self-pay | Admitting: Cardiology

## 2022-08-19 ENCOUNTER — Other Ambulatory Visit: Payer: Self-pay | Admitting: Cardiology

## 2022-09-03 ENCOUNTER — Other Ambulatory Visit: Payer: Self-pay | Admitting: Cardiology

## 2022-10-10 ENCOUNTER — Other Ambulatory Visit: Payer: Self-pay | Admitting: Cardiology

## 2022-11-02 ENCOUNTER — Other Ambulatory Visit: Payer: Self-pay | Admitting: Cardiology

## 2022-11-16 ENCOUNTER — Other Ambulatory Visit: Payer: Self-pay | Admitting: Cardiology

## 2022-11-23 ENCOUNTER — Other Ambulatory Visit: Payer: Self-pay | Admitting: Cardiology

## 2023-03-19 ENCOUNTER — Ambulatory Visit: Payer: Medicare HMO | Admitting: Cardiology

## 2023-03-29 ENCOUNTER — Ambulatory Visit: Payer: Medicare HMO | Attending: Nurse Practitioner | Admitting: Nurse Practitioner

## 2023-03-29 ENCOUNTER — Encounter: Payer: Self-pay | Admitting: Nurse Practitioner

## 2023-03-29 VITALS — BP 130/72 | HR 131 | Ht 69.0 in | Wt 196.4 lb

## 2023-03-29 DIAGNOSIS — Z01810 Encounter for preprocedural cardiovascular examination: Secondary | ICD-10-CM | POA: Diagnosis not present

## 2023-03-29 DIAGNOSIS — I4821 Permanent atrial fibrillation: Secondary | ICD-10-CM | POA: Diagnosis not present

## 2023-03-29 DIAGNOSIS — I5032 Chronic diastolic (congestive) heart failure: Secondary | ICD-10-CM | POA: Diagnosis not present

## 2023-03-29 DIAGNOSIS — I2510 Atherosclerotic heart disease of native coronary artery without angina pectoris: Secondary | ICD-10-CM

## 2023-03-29 DIAGNOSIS — I10 Essential (primary) hypertension: Secondary | ICD-10-CM | POA: Diagnosis not present

## 2023-03-29 DIAGNOSIS — E785 Hyperlipidemia, unspecified: Secondary | ICD-10-CM

## 2023-03-29 MED ORDER — FUROSEMIDE 20 MG PO TABS: 20.0000 mg | ORAL_TABLET | Freq: Every day | ORAL | 3 refills | Status: AC

## 2023-03-29 MED ORDER — POTASSIUM CHLORIDE CRYS ER 20 MEQ PO TBCR: 20.0000 meq | EXTENDED_RELEASE_TABLET | Freq: Every day | ORAL | 3 refills | Status: AC

## 2023-03-29 MED ORDER — METOPROLOL SUCCINATE ER 50 MG PO TB24: 50.0000 mg | ORAL_TABLET | Freq: Every day | ORAL | 3 refills | Status: AC

## 2023-03-29 MED ORDER — DILTIAZEM HCL ER COATED BEADS 240 MG PO CP24: 240.0000 mg | ORAL_CAPSULE | Freq: Every day | ORAL | 3 refills | Status: AC

## 2023-03-29 MED ORDER — DABIGATRAN ETEXILATE MESYLATE 150 MG PO CAPS: 150.0000 mg | ORAL_CAPSULE | Freq: Two times a day (BID) | ORAL | 5 refills | Status: AC

## 2023-03-29 NOTE — Patient Instructions (Addendum)
Medication Instructions:  Lasix (Furosemide) 20 mg daily Increase Metoprolol 50 mg daily  *If you need a refill on your cardiac medications before your next appointment, please call your pharmacy*   Lab Work: BMET today  Testing/Procedures: NONE ordered at this time of appointment     Follow-Up: At [facility], you and your health needs are our priority.  As part of our continuing mission to provide you with exceptional heart care, we have created designated Provider Care Teams.  These Care Teams include your primary Cardiologist (physician) and Advanced Practice Providers (APPs -  Physician Assistants and Nurse Practitioners) who all work together to provide you with the care you need, when you need it.  We recommend signing up for the patient portal called "MyChart".  Sign up information is provided on this After Visit Summary.  MyChart is used to connect with patients for Virtual Visits (Telemedicine).  Patients are able to view lab/test results, encounter notes, upcoming appointments, etc.  Non-urgent messages can be sent to your provider as well.   To learn more about what you can do with MyChart, go to [email or web address].    Your next appointment:   2 week(s)  Provider:   Any APP    Other Instructions Please have your eye doctor send over surgical clearance letter.

## 2023-03-29 NOTE — Progress Notes (Signed)
Office Visit    Patient Name: Richard Simon Date of Encounter: 03/29/2023  Primary Care Provider:  Georgianne Fick, MD Primary Cardiologist:  Bryan Lemma, MD  Chief Complaint    89 year old male with a history of CAD s/p CABG x 4 (LIMA-LAD, SVG-diagonal, sequential SVG-OM1/OM 2) in 2004, permanent atrial fibrillation, severe aortic stenosis s/p TAVR in 2018, chronic diastolic heart failure, hypertension, and hyperlipidemia who presents for follow-up and for preoperative cardiac evaluation.  Past Medical History    Past Medical History:  Diagnosis Date   Atrial fibrillation, permanent (HCC)    Rate controlled. Anticoagulation with Pradaxa.   CAD, multiple vessel 2004   S/P 4-vessel CABG in 2004: Pre-TAVR CATH: Occluded OM1 and distal circumflex PRIOR TO GRAFT; 70&80% mid LAD stenosis prior to LIMA graft, 95% D1 stenosis prior to graft. 4/4 Grafts patent (LIMA-LAD, SVG-DIAG, SVG-OM-OM).     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 Excell Seltzer: [name] 3 29 mm biprosthetic valve   Severe aortic stenosis by prior echocardiogram - s/pTAVR 01/2013; 06/2014   05/2016: Presented with progressively worsening aortic stenosis and referred for TAVR. - Underwent TAVR March 2018   Past Surgical History:  Procedure Laterality Date   CARDIAC CATHETERIZATION  12/11/2002   Recommendation-CABG; 80% mid LAD, 70% distal LAD. Mid circumflex 95%, OM1 95%. AV groove circumflex 95%. Proximal RCA 100%   CORONARY ARTERY BYPASS GRAFT  12/15/2002   LIMA-LAD, SVG-diagonal, SVG-OM1-OM2 sequential   LEXISCAN MYOVIEW  December 2012   Fixed basal inferior diaphragmatic attenuation versusinfarct, no ischemia   MULTIPLE EXTRACTIONS WITH ALVEOLOPLASTY N/A 06/20/2016   Procedure: EXTRACTION of #7, 12, 13, and 15 WITH ALVEOLOPLASTY and gross debridement of teeth;   Surgeon: Charlynne Pander, DDS;  Location: [facility] OR;  Service: Oral Surgery;  Laterality: N/A;   RIGHT HEART CATH AND CORONARY/GRAFT ANGIOGRAPHY N/A 06/13/2016   Procedure: Right Heart Cath and Coronary/Graft Angiography;  Surgeon: Marykay Lex, MD;  Location: [facility] INVASIVE CV LAB;  Service: Cardiovascular: Occluded OM1 and distal circumflex PRIOR TO GRAFT; 70&80% mid LAD stenosis prior to LIMA graft, 95% D1 stenosis prior to graft. 4/4 Grafts patent (LIMA-LAD, SVG-DIAG, SVG-OM-OM).   Mild 2 pulmonary hypertension: PCWP 25 mmHg. PA peak/mean 48/23/33 mmH   TEE WITHOUT CARDIOVERSION N/A 06/22/2016   Procedure: TRANSESOPHAGEAL ECHOCARDIOGRAM (TEE);  Surgeon: Tonny Bollman, MD;  Location[facility] OR;  Service: Open Heart Surgery;  Laterality: N/A;   TRANSCATHETER AORTIC VALVE REPLACEMENT, TRANSFEMORAL N/A 06/22/2016   Procedure: TRANSCATHETER AORTIC VALVE REPLACEMENT, TRANSFEMORAL;  Surgeon: Tonny Bollman, MD;  Location: [facility] OR;  Service: Open Heart Surgery: [name] 3 29 mm biprosthetic walve.   TRANSTHORACIC ECHOCARDIOGRAM  06/2016   Post TAVR:  EF 40-45% with diffuse hypokinesis. High LV filling pressures. Well-seated Edwards's Sapien 3 TAVR bioprosthesis.  - AVA ~1.5. PA pressures now estimated 43 mmHg.   TRANSTHORACIC ECHOCARDIOGRAM  04/2019   EF 60-65.  Normal wall motion with exception of abnormal postoperative septal motion. -- 29 mm Edwards Sapien 3 TAVR. 06/22/2016 w/o paravalvular leak or significant aortic stenosis--normal functioning valve.  Gradients are stable compared to prior echo (mean gradient 8 mmHg).  Mild RV enlargement.  Mild to moderate RA and mild LA enlargement.     TRANSTHORACIC ECHOCARDIOGRAM  06/2014   EF 60-65%. Mod-Severe AS, AVA~0.69, Mn-Pk gradient 32  mmHg -- 55 mmHg, severe LA dilation, moderate-severe RA dilation: Progression of disease    Allergies  Allergies  Allergen Reactions   No Known Allergies      Labs/Other Studies Reviewed    The following studies were  reviewed today:  Cardiac Studies & Procedures   CARDIAC CATHETERIZATION  CARDIAC CATHETERIZATION 06/13/2016  Narrative Images from the original result were not included.   Echocardiographic evidence of Severe/Critical Aortic Stenosis - now symptomatic  Hemodynamic findings consistent with mild secondary pulmonary hypertension.  Mid LAD lesion, 70 %stenosed. Dist LAD lesion, 80 %stenosed.  LIMA-dLAD is normal in caliber and anatomically normal.  Ost 2nd Diag to 2nd Diag lesion, 95 %stenosed.  SVG-Diag2 is normal in caliber and anatomically normal.  Ost 1st Mrg to 1st Mrg lesion, 100 %stenosed.  Mid Cx lesion, 100 %stenosed.  Seq SVG- OM1-LPL(dCx) and is large and anatomically normal.  Small nondominant RCA patent  4/4 widely patent grafts with severe native coronary disease  Mildly elevated PA pressures with elevated PCWP.  Plan:  Return to nursing unit for ongoing care and management per primary service as well as cardiology consultation team.  TR band removal per protocol  CT surgery has been consulted for evaluation for possible aortic valve replacement (likley TAVR)    Bryan Lemma, M.D., M.S. Interventional Cardiologist  Pager # [phone] Phone # [phone] [address] [place], [place] [address]  Findings Coronary Findings Diagnostic  Dominance: Left  Left Main Vessel is large.  Left Anterior Descending  The lesion is distal to major branch and segmental. Competitive flow from LIMA seen  First Diagonal Branch Vessel is small in size.  First Septal Branch Vessel is moderate in size.  Second Diagonal Branch Vessel is moderate in size. The lesion is located at the major branch. Chronic subtotal occlusion  Lateral Second Diagonal Branch Vessel is small in size.  Second Septal Branch Vessel is small in size.  Third Diagonal Branch Vessel is small in size.  Third Septal Branch Vessel is small in size.  Left  Circumflex The lesion is chronically occluded. Chronic  First Obtuse Marginal Branch Vessel is moderate in size. The lesion is chronically occluded. Chronic  Fourth Obtuse Marginal Branch Vessel is small in size.  Left Posterior Descending Artery Vessel is large in size. Collaterals LPDA filled by collaterals from Dist LAD.  First Left Posterolateral Branch  Second Left Posterolateral Branch Vessel is small in size.  Right Coronary Artery Vessel is small. There is moderate the vessel.  Saphenous Graft To 2nd Diag SVG and is normal in caliber and anatomically normal.  Sequential Jump Graft Graft To 1st Mrg, Ost LPDA Seq SVG- OM1-LPL(dCx) and is large and anatomically normal.  LIMA LIMA Graft To Dist LAD LIMA and is normal in caliber and anatomically normal. There is competitive flow.  Intervention  No interventions have been documented.   STRESS TESTS  NM MYOCAR MULTI W/SPECT W 04/05/2011  ECHOCARDIOGRAM  ECHOCARDIOGRAM COMPLETE 03/23/2022  Narrative ECHOCARDIOGRAM REPORT    Patient Name:   Richard Simon    Date of Exam: 03/23/2022 Medical Rec #:  [id number]     Height:       70.0 in Accession #:    [id number]    Weight:       188.8 lb Date of Birth:  [birth year]     BSA:          2.037 m Patient Age:    89 years      BP:  149/92 mmHg Patient Gender: M             HR:           85 bpm. Exam Location:  [address]  Procedure: 2D Echo, Cardiac Doppler and Color Doppler  Indications:    Z95.2 s/p AVR  History:        Patient has prior history of Echocardiogram examinations. CAD, Prior CABG, Arrythmias:Atrial Fibrillation; Risk Factors:Hypertension and HLD.  Sonographer:    Clearence Ped RCS Referring Phys: [id number] DAVID W HARDING  IMPRESSIONS   1. Left ventricular ejection fraction, by estimation, is 50 to 55%. The left ventricle has low normal function. The left ventricle has no regional wall motion abnormalities. There is mild left ventricular  hypertrophy. Left ventricular diastolic parameters are indeterminate. 2. Right ventricular systolic function is mildly reduced. The right ventricular size is mildly enlarged. There is normal pulmonary artery systolic pressure. The estimated right ventricular systolic pressure is 29.2 mmHg. 3. Left atrial size was moderately dilated. 4. Right atrial size was mildly dilated. 5. The mitral valve is degenerative. Trivial mitral valve regurgitation. 6. The aortic valve has been repaired/replaced. Aortic valve regurgitation is not visualized. Echo findings are consistent with normal structure and function of the aortic valve prosthesis. Aortic valve mean gradient measures 7.0 mmHg. 7. The inferior vena cava is normal in size with greater than 50% respiratory variability, suggesting right atrial pressure of 3 mmHg.  FINDINGS Left Ventricle: Left ventricular ejection fraction, by estimation, is 50 to 55%. The left ventricle has low normal function. The left ventricle has no regional wall motion abnormalities. The left ventricular internal cavity size was normal in size. There is mild left ventricular hypertrophy. Left ventricular diastolic parameters are indeterminate.  Right Ventricle: The right ventricular size is mildly enlarged. No increase in right ventricular wall thickness. Right ventricular systolic function is mildly reduced. There is normal pulmonary artery systolic pressure. The tricuspid regurgitant velocity is 2.56 m/s, and with an assumed right atrial pressure of 3 mmHg, the estimated right ventricular systolic pressure is 29.2 mmHg.  Left Atrium: Left atrial size was moderately dilated.  Right Atrium: Right atrial size was mildly dilated.  Pericardium: Trivial pericardial effusion is present.  Mitral Valve: The mitral valve is degenerative in appearance. Trivial mitral valve regurgitation.  Tricuspid Valve: The tricuspid valve is normal in structure. Tricuspid valve regurgitation is  trivial.  Aortic Valve: The aortic valve has been repaired/replaced. Aortic valve regurgitation is not visualized. Aortic valve mean gradient measures 7.0 mmHg. Aortic valve peak gradient measures 11.3 mmHg. Aortic valve area, by VTI measures 1.86 cm. There is a 29 mm Edwards Sapien prosthetic, stented (TAVR) valve present in the aortic position. Echo findings are consistent with normal structure and function of the aortic valve prosthesis.  Pulmonic Valve: The pulmonic valve was not well visualized. Pulmonic valve regurgitation is not visualized.  Aorta: The aortic root and ascending aorta are structurally normal, with no evidence of dilitation.  Venous: The inferior vena cava is normal in size with greater than 50% respiratory variability, suggesting right atrial pressure of 3 mmHg.  IAS/Shunts: The interatrial septum was not well visualized.   LEFT VENTRICLE PLAX 2D LVIDd:         4.70 cm   Diastology LVIDs:         3.50 cm   LV e' medial:    7.62 cm/s LV PW:         1.10 cm   LV E/e' medial:  16.8 LV IVS:        1.00 cm   LV e' lateral:   5.44 cm/s LVOT diam:     2.10 cm   LV E/e' lateral: 23.5 LV SV:         72 LV SV Index:   35 LVOT Area:     3.46 cm   RIGHT VENTRICLE RV Basal diam:  4.20 cm RV Mid diam:    2.40 cm RV S prime:     9.25 cm/s TAPSE (M-mode): 1.4 cm RVSP:           29.2 mmHg  LEFT ATRIUM              Index        RIGHT ATRIUM           Index LA diam:        5.00 cm  2.45 cm/m   RA Pressure: 3.00 mmHg LA Vol (A2C):   114.0 ml 55.97 ml/m  RA Area:     22.60 cm LA Vol (A4C):   85.2 ml  41.83 ml/m  RA Volume:   68.70 ml  33.73 ml/m LA Biplane Vol: 99.0 ml  48.61 ml/m AORTIC VALVE AV Area (Vmax):    2.05 cm AV Area (Vmean):   2.03 cm AV Area (VTI):     1.86 cm AV Vmax:           168.00 cm/s AV Vmean:          126.000 cm/s AV VTI:            0.385 m AV Peak Grad:      11.3 mmHg AV Mean Grad:      7.0 mmHg LVOT Vmax:         99.30 cm/s LVOT  Vmean:        73.800 cm/s LVOT VTI:          0.207 m LVOT/AV VTI ratio: 0.54  AORTA Ao Root diam: 3.30 cm Ao Asc diam:  2.70 cm  MITRAL VALVE                TRICUSPID VALVE MV Area (PHT):              TR Peak grad:   26.2 mmHg MV Decel Time:              TR Vmax:        256.00 cm/s MR Peak grad: 88.4 mmHg     Estimated RAP:  3.00 mmHg MR Mean grad: 62.0 mmHg     RVSP:           29.2 mmHg MR Vmax:      470.00 cm/s MR Vmean:     375.0 cm/s    SHUNTS MV E velocity: 128.00 cm/s  Systemic VTI:  0.21 m Systemic Diam: 2.10 cm  Epifanio Lesches MD Electronically signed by Epifanio Lesches MD Signature Date/Time: 03/23/2022/6:11:32 PM    Final  TEE  ECHO TEE 06/22/2016  Narrative *[place]* *[facility]* [address] [place], [place] [address] [phone]  ------------------------------------------------------------------- Transesophageal Echocardiography  Patient:    Talbert, Berriman MR #:       [id number] Study Date: 06/22/2016 Gender:     M Age:        86 Height:     175.3 cm Weight:     76.6 kg BSA:        1.94 m^2 Pt. Status: Room:  1H08M  PERFORMING   Charlton Haws, M.D. ORDERING     Evelene Croon, MD REFERRING    Evelene Croon, MD ATTENDING    Tonny Bollman, MD SONOGRAPHER  Leta Jungling, RDCS ADMITTING    Bobette Mo  cc:  -------------------------------------------------------------------  ------------------------------------------------------------------- Indications:      Aortic stenosis 424.1.  ------------------------------------------------------------------- History:   PMH:  Shortness of Breath.  Atrial fibrillation. Coronary artery disease.  Congestive heart failure.  ------------------------------------------------------------------- Study Conclusions  - Aortic valve: Valve area (VTI): 2.42 cm^2. Valve area (Vmax): 2.58 cm^2. Valve area (Vmean): 2.72 cm^2. - Impressions: Pre TAVR: EF 40-45% diffuse  hypokinesis Biatrial enlargement Normal RV. No pericardial effusion Normal aortic root. Mild MR Severely calcified trileaflet aortic valve. Moderate AR Severe AS Peak velocity 3.8 /.sec, mean gradient 40 mmHg peak gradient 60 mmHg AVA 1.4 cm2. LAA with severe dense spontaneous contrast  Post TAVR: EF improved to 50-55%. Less spontaneous contrast in atria. No aortic root trauma. No pericardial effusion MR remains mild Well placed 29 mm Sapien 3 valve. No significant peri valvular regurgitation Good leaflet motion. Peak velocity 102m/sec, peak gradient 3 mmHg, mean gradient 2 mmHg AVA 3.3 cm2.  Impressions:  - Pre TAVR: EF 40-45% diffuse hypokinesis Biatrial enlargement Normal RV. No pericardial effusion Normal aortic root. Mild MR Severely calcified trileaflet aortic valve. Moderate AR Severe AS Peak velocity 3.8 /.sec, mean gradient 40 mmHg peak gradient 60 mmHg AVA 1.4 cm2. LAA with severe dense spontaneous contrast  Post TAVR: EF improved to 50-55%. Less spontaneous contrast in atria. No aortic root trauma. No pericardial effusion MR remains mild Well placed 29 mm Sapien 3 valve. No significant peri valvular regurgitation Good leaflet motion. Peak velocity 30m/sec, peak gradient 3 mmHg, mean gradient 2 mmHg AVA 3.3 cm2.  ------------------------------------------------------------------- Labs, prior tests, procedures, and surgery: Coronary artery bypass grafting.  ------------------------------------------------------------------- Study data:   Study status:  Routine.  Consent:  The risks, benefits, and alternatives to the procedure were explained to the patient and informed consent was obtained.  Procedure:  The patient reported no pain pre or post test. Initial setup. The patient was brought to the laboratory. Surface ECG leads were monitored. Sedation. Deep sedation was administered by anesthesiology staff. Transesophageal echocardiography. Topical anesthesia was  obtained using viscous lidocaine. A transesophageal probe was inserted by the anesthesiologist. Image quality was adequate.  Study completion:  The patient tolerated the procedure well. There were no complications.          Diagnostic transesophageal echocardiography.  2D and color Doppler.  Birthdate:  Patient birthdate: May 18, 1929.  Age:  Patient is 89 yr old.  Sex:  Gender: male.    BMI: 24.9 kg/m^2.  Blood pressure:     132/89  Patient status:  Inpatient.  Study date:  Study date: 06/22/2016. Study time: 01:16 PM.  Location:  Operating room.  -------------------------------------------------------------------  ------------------------------------------------------------------- Aortic valve:   Doppler:     VTI ratio of LVOT to aortic valve: 0.64. Valve area (VTI): 2.42 cm^2. Indexed valve area (VTI): 1.25 cm^2/m^2. Peak velocity ratio of LVOT to aortic valve: 0.68. Valve area (Vmax): 2.58 cm^2. Indexed valve area (Vmax): 1.33 cm^2/m^2. Mean velocity ratio of LVOT to aortic valve: 0.72. Valve area (Vmean): 2.72 cm^2. Indexed valve area (Vmean): 1.4 cm^2/m^2. Mean gradient (S): 3 mm Hg. Peak gradient (S): 6 mm Hg.  ------------------------------------------------------------------- Measurements  Left ventricle  Value Stroke volume, 2D                        59    ml Stroke volume/bsa, 2D                    30    ml/m^2  LVOT                                     Value LVOT ID, S                               22    mm LVOT area                                3.8   cm^2 LVOT peak velocity, S                    82.8  cm/s LVOT mean velocity, S                    62.8  cm/s LVOT VTI, S                              15.5  cm  Aortic valve                             Value Aortic valve peak velocity, S            122   cm/s Aortic valve mean velocity, S            87.8  cm/s Aortic valve VTI, S                      24.3  cm Aortic mean gradient, S                   3     mm Hg Aortic peak gradient, S                  6     mm Hg VTI ratio, LVOT/AV                       0.64 Aortic valve area, VTI                   2.42  cm^2 Aortic valve area/bsa, VTI               1.25  cm^2/m^2 Velocity ratio, peak, LVOT/AV            0.68 Aortic valve area, peak velocity         2.58  cm^2 Aortic valve area/bsa, peak velocity     1.33  cm^2/m^2 Velocity ratio, mean, LVOT/AV            0.72 Aortic valve area, mean velocity         2.72  cm^2 Aortic valve area/bsa, mean velocity     1.4   cm^2/m^2  Legend: (L)  and  (H)  mark values outside specified reference range.  ------------------------------------------------------------------- Prepared and Electronically Authenticated by  Theron Arista  Eden Emms, M.D. 2018-03-08T22:20:38           Recent Labs: No results found for requested labs within last 365 days.  Recent Lipid Panel    Component Value Date/Time   CHOL 150 12/28/2010 0645   TRIG 45 12/28/2010 0645   HDL 56 12/28/2010 0645   CHOLHDL 2.7 12/28/2010 0645   VLDL 9 12/28/2010 0645   LDLCALC 85 12/28/2010 0645    History of Present Illness    89 year old male with the above past medical history including CAD s/p CABG x 4 (LIMA-LAD, SVG-diagonal, sequential SVG-OM1/OM 2) in 2004, permanent atrial fibrillation, severe aortic stenosis s/p TAVR in 2018, chronic diastolic heart failure, hypertension, and hyperlipidemia,  Cardiac catheterization pre-TAVR showed stable patent grafts with significant native disease.  Most recent echocardiogram in 03/2022 showed EF 50 to 55%, low normal LV function, no RWMA, mild LVH, indeterminate diastolic parameters, moderate LAE, mild RAE, mildly reduced RV systolic function, normally functioning aortic valve prosthesis, mean gradient 7 mmHg.  He was last seen in office on 02/27/2022 and was stable from a cardiac standpoint.  He was cleared to proceed with eye surgery at the time.  He presents today for follow-up accompanied  by his son and for preoperative cardiac evaluation.  Since his last visit he has been stable from a cardiac standpoint.  There is some confusion regarding his current medications.  His heart rate is elevated in office today, he is asymptomatic.  He does have some nonpitting bilateral lower extremity edema, he denies dyspnea, PND, orthopnea, weight gain, denies symptoms concerning for angina.  He is eager to proceed with eye surgery, though we do not have a formal clearance request at this point in time.  Overall, he reports feeling well.  Home Medications    Current Outpatient Medications  Medication Sig Dispense Refill   Acetaminophen 500 MG coapsule Take 1 tablet by mouth every 6 (six) hours as needed for pain.     amoxicillin (AMOXIL) 500 MG tablet Take 4 tablets by mouth one hour prior to dental appointment 4 tablet 4   co-enzyme Q-10 30 MG capsule Take 30 mg by mouth daily.     dabigatran (PRADAXA) 150 MG CAPS capsule TAKE 1 CAPSULE(150 MG) BY MOUTH TWICE DAILY 60 capsule 1   diltiazem (CARDIZEM CD) 240 MG 24 hr capsule TAKE 1 CAPSULE EVERY DAY (NEED MD APPOINTMENT FOR REFILLS LAST ATTEMPT) 90 capsule 3   donepezil (ARICEPT) 10 MG tablet Take 1 tablet by mouth at bedtime.     furosemide (LASIX) 40 MG tablet TAKE 1/2 TABLET (20 MG) DAILY FOR ONE MONTH, THEN DECREASE TO 1/2 TABLET (20 MG) THREE TIMES A WEEK. NEED MD APPOINTMENT 20 tablet 3   memantine (NAMENDA) 10 MG tablet TAKE 1 TABLET EVERY DAY (NEED MD APPOINTMENT FOR REFILLS) 90 tablet 3   metoprolol succinate (TOPROL-XL) 25 MG 24 hr tablet TAKE 1 TABLET EVERY DAY (APPOINTMENT IS NEEDED FOR REFILLS) 15 tablet 11   Omega-3 Fatty Acids (FISH OIL) 1200 MG CAPS Take 1,200 mg by mouth 2 (two) times daily.     Potassium Chloride ER 20 MEQ TBCR TAKE 1 TABLET EVERY DAY 90 tablet 3   potassium chloride SA (KLOR-CON M) 20 MEQ tablet TAKE 1 TABLET EVERY DAY (APPOINTMENT IS NEEDED FOR REFILLS) 15 tablet 0   sennosides-docusate sodium (SENOKOT-S) 8.6-50  MG tablet Take 2 tablets by mouth at bedtime.     No current facility-administered medications for this visit.     Review of  Systems    He denies chest pain, palpitations, dyspnea, pnd, orthopnea, n, v, dizziness, syncope, edema, weight gain, or early satiety. All other systems reviewed and are otherwise negative except as noted above.    Physical Exam    VS:  BP 130/72   Pulse (!) 131   Ht 5\' 9"  (1.753 m)   Wt 196 lb 6.4 oz (89.1 kg)   BMI 29.00 kg/m   GEN: Well nourished, well developed, in no acute distress. HEENT: normal. Neck: Supple, no JVD, carotid bruits, or masses. Cardiac: IRIR, no murmurs, rubs, or gallops. No clubbing, cyanosis, nonpitting bilateral lower extremity edema.  Radials/DP/PT 2+ and equal bilaterally.  Respiratory:  Respirations regular and unlabored, clear to auscultation bilaterally. GI: Soft, nontender, nondistended, BS + x 4. MS: no deformity or atrophy. Skin: warm and dry, no rash. Neuro:  Strength and sensation are intact. Psych: Normal affect.  Accessory Clinical Findings    ECG personally reviewed by me today - EKG Interpretation Date/Time:  Thursday March 29 2023 08:54:16 EST Ventricular Rate:  131 PR Interval:    QRS Duration:  92 QT Interval:  304 QTC Calculation: 448 R Axis:   81  Text Interpretation: Atrial fibrillation with rapid ventricular response Nonspecific ST abnormality When compared with ECG of 23-Jun-2016 06:55, Nonspecific T wave abnormality now evident in Inferior leads Confirmed by Bernadene Person ([id number]) on 03/29/2023 9:00:38 AM  - no acute changes.   Lab Results  Component Value Date   WBC 6.7 07/03/2016   HGB 12.7 (A) 07/03/2016   HCT 38 (A) 07/03/2016   MCV 93.9 06/27/2016   PLT 263 07/03/2016   Lab Results  Component Value Date   CREATININE 0.8 07/03/2016   BUN 7 07/03/2016   NA 136 (A) 07/03/2016   K 4.5 07/03/2016   CL 99 (L) 06/25/2016   CO2 28 06/25/2016   Lab Results  Component Value Date   ALT 15  12/27/2010   AST 19 12/27/2010   ALKPHOS 65 12/27/2010   BILITOT 0.5 12/27/2010   Lab Results  Component Value Date   CHOL 150 12/28/2010   HDL 56 12/28/2010   LDLCALC 85 12/28/2010   TRIG 45 12/28/2010   CHOLHDL 2.7 12/28/2010    Lab Results  Component Value Date   HGBA1C 6.1 (H) 06/22/2016    Assessment & Plan    1. CAD: S/p CABG x 4 (LIMA-LAD, SVG-diagonal, sequential SVG-OM1/OM 2) in 2004. Stable with no anginal symptoms. No indication for ischemic evaluation.  No longer on statin therapy.  No ASA in the setting of chronic anticoagulation.  Continue metoprolol as below.  2. Permanent atrial fibrillation: Historically rate controlled, however, EKG today shows atrial fibrillation with RVR.  He does have nonpitting bilateral lower extremity edema, otherwise asymptomatic.  Will increase metoprolol to 50 mg daily.  Continue to monitor BP/HR.  Will increase Lasix to 20 mg daily.  Will check BMET today and plan for repeat BMET in 2 weeks.  Continue diltiazem, Pradaxa.  3. Severe aortic stenosis: S/p TAVR in 2018.  Most recent echo in 03/2022 showed normally functioning aortic valve prosthesis, mean gradient 7 mmHg.  He has nonpitting bilateral lower extremity edema, will increase Lasix as above, otherwise, euvolemic and well compensated.  Continue SBE prophylaxis.  4. Chronic diastolic heart failure: Echocardiogram in 03/2022 showed EF 50 to 55%, low normal LV function, no RWMA, mild LVH, indeterminate diastolic parameters, moderate LAE, mild RAE, mildly reduced RV systolic function, normally functioning aortic valve prosthesis,  mean gradient 7 mmHg. Nonpitting bilateral lower extremity edema, generally euvolemic on exam.  Will increase Lasix as above.  He will need repeat BMET at follow-up.  5. Hypertension: BP well controlled. Continue current antihypertensive regimen.   6. Hyperlipidemia: No recent LDL on file.  He is no longer on statin therapy.  No indication for repeat labs at this  time.  7. Preoperative cardiac exam: According to the Revised Cardiac Risk Index (RCRI), his Perioperative Risk of Major Cardiac Event is (%): 6.6. His Functional Capacity in METs is: 4.64 according to the Duke Activity Status Index (DASI).  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  He has an appointment with Dr. Dione Booze to discuss this. Our office has not received a surgical clearance request.  He was previously cleared by Dr. Herbie Baltimore for surgery.  However, EKG today shows atrial fibrillation with RVR.  Will increase metoprolol as above.  Will have him follow-up in 2 weeks.  If HR stabilized, he will be at acceptable (moderate) risk for surgery.  Will reach out to pharmacy for recommendations for holding Pradaxa once official clearance request procedural details is received.  8. Disposition: Follow-up in 2 weeks.      Joylene Grapes, NP 03/29/2023, 9:01 AM

## 2023-03-30 LAB — BASIC METABOLIC PANEL
BUN/Creatinine Ratio: 15 (ref 10–24)
BUN: 14 mg/dL (ref 10–36)
CO2: 24 mmol/L (ref 20–29)
Calcium: 9.6 mg/dL (ref 8.6–10.2)
Chloride: 101 mmol/L (ref 96–106)
Creatinine, Ser: 0.95 mg/dL (ref 0.76–1.27)
Glucose: 141 mg/dL — ABNORMAL HIGH (ref 70–99)
Potassium: 4.9 mmol/L (ref 3.5–5.2)
Sodium: 137 mmol/L (ref 134–144)
eGFR: 76 mL/min/{1.73_m2} (ref >59)

## 2023-04-03 DIAGNOSIS — Z961 Presence of intraocular lens: Secondary | ICD-10-CM | POA: Diagnosis not present

## 2023-04-03 DIAGNOSIS — H31091 Other chorioretinal scars, right eye: Secondary | ICD-10-CM | POA: Diagnosis not present

## 2023-04-03 DIAGNOSIS — H0589 Other disorders of orbit: Secondary | ICD-10-CM | POA: Diagnosis not present

## 2023-04-03 DIAGNOSIS — H2512 Age-related nuclear cataract, left eye: Secondary | ICD-10-CM | POA: Diagnosis not present

## 2023-04-03 DIAGNOSIS — H0520 Unspecified exophthalmos: Secondary | ICD-10-CM | POA: Diagnosis not present

## 2023-04-03 DIAGNOSIS — H04123 Dry eye syndrome of bilateral lacrimal glands: Secondary | ICD-10-CM | POA: Diagnosis not present

## 2023-04-03 DIAGNOSIS — D4989 Neoplasm of unspecified behavior of other specified sites: Secondary | ICD-10-CM | POA: Diagnosis not present

## 2023-04-04 ENCOUNTER — Telehealth: Payer: Self-pay

## 2023-04-04 NOTE — Telephone Encounter (Signed)
Pts son returned call and was notified of lab results. Pt will continue current medication and f/u as planned.

## 2023-04-04 NOTE — Telephone Encounter (Signed)
Lmom to discuss lab results. Waiting on a return call.  

## 2023-04-10 NOTE — Progress Notes (Deleted)
Cardiology Office Note:    Date:  04/10/2023   ID:  Richard Simon, DOB [id number], MRN [id number]  PCP:  Richard Fick, MD   Richard Simon Providers Cardiologist:  Richard Lemma, MD { Click to update primary MD,subspecialty MD or APP then REFRESH:1}    Referring MD: Richard Fick, MD   No chief complaint on file. ***  History of Present Illness:    Richard Simon is a 89 y.o. male with a hx of CAD s/p CABG x 4 (LIMA-LAD, SVG-diagonal, sequential SVG-OM1-OM2) in 2004, permanent atrial fibrillation, severe aortic stenosis s/p TAVR in 2018, chronic diastolic heart failure, hypertension, and hyperlipidemia.  Cardiac catheterization pre-TAVR showed stable patent grafts with significant native disease.  Echocardiogram 03/2022 with an LVEF 50-55% with low normal LV function, no R WMA, mild LVH, moderate LAE, mild RAE, mildly reduced RV systolic function, normally functioning aortic valve prosthesis with a mean gradient 7 mmHg.   He was seen in clinic 03/29/2023 and was confused about current medications.  He had lower extremity swelling but denied dyspnea.  His A-fib was also not rate controlled at that visit.  He was seeking preoperative risk evaluation prior to left eye surgery to remove tumor.  Richard Simon opted to increase his beta-blocker for better rate control with close follow-up.  He presents today for 2-week follow-up.     CAD s/p CABG x 4 LIMA-LAD, SVG-diagonal, sequential SVG-OM1-OM2, 2004 -Not on aspirin in the setting of anticoagulation -Continue beta-blocker   Permanent atrial fibrillation Chronic anticoagulation - RVR at last clinic visit - Increase metoprolol to 50 mg   HFpEF Echocardiogram 03/2022 with an LVEF 50-55%, low normal LVEF -- Lower extremity swelling --> Lasix increased   Hypertension -- BP controlled   Hyperlipidemia with LDL less than 70 -- needs lipid panel      Past Medical History:  Diagnosis Date   Atrial fibrillation,  permanent (HCC)    Rate controlled. Anticoagulation with Pradaxa.   CAD, multiple vessel 2004   S/P 4-vessel CABG in 2004: Pre-TAVR CATH: Occluded OM1 and distal circumflex PRIOR TO GRAFT; 70&80% mid LAD stenosis prior to LIMA graft, 95% D1 stenosis prior to graft. 4/4 Grafts patent (LIMA-LAD, SVG-DIAG, SVG-OM-OM).     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 Excell Simon: Richard Simon 3 29 mm biprosthetic valve   Severe aortic stenosis by prior echocardiogram - s/pTAVR 01/2013; 06/2014   05/2016: Presented with progressively worsening aortic stenosis and referred for TAVR. - Underwent TAVR March 2018    Past Surgical History:  Procedure Laterality Date   CARDIAC CATHETERIZATION  12/11/2002   Recommendation-CABG; 80% mid LAD, 70% distal LAD. Mid circumflex 95%, OM1 95%. AV groove circumflex 95%. Proximal RCA 100%   CORONARY ARTERY BYPASS GRAFT  12/15/2002   LIMA-LAD, SVG-diagonal, SVG-OM1-OM2 sequential   LEXISCAN MYOVIEW  December 2012   Fixed basal inferior diaphragmatic attenuation versusinfarct, no ischemia   MULTIPLE EXTRACTIONS WITH ALVEOLOPLASTY N/A 06/20/2016   Procedure: EXTRACTION of #7, 12, 13, and 15 WITH ALVEOLOPLASTY and gross debridement of teeth;  Surgeon: Charlynne Pander, DDS;  Location: [facility] OR;  Service: Oral Surgery;  Laterality: N/A;   RIGHT HEART CATH AND CORONARY/GRAFT ANGIOGRAPHY N/A 06/13/2016   Procedure: Right Heart Cath and Coronary/Graft Angiography;  Surgeon: Marykay Lex, MD;  Location: [facility] INVASIVE CV  LAB;  Service: Cardiovascular: Occluded OM1 and distal circumflex PRIOR TO GRAFT; 70&80% mid LAD stenosis prior to LIMA graft, 95% D1 stenosis prior to graft. 4/4 Grafts patent (LIMA-LAD, SVG-DIAG, SVG-OM-OM).   Mild 2 pulmonary hypertension: PCWP 25 mmHg. PA peak/mean 48/23/33 mmH   TEE WITHOUT CARDIOVERSION N/A 06/22/2016    Procedure: TRANSESOPHAGEAL ECHOCARDIOGRAM (TEE);  Surgeon: Tonny Bollman, MD;  Location: [facility];  Service: Open Heart Surgery;  Laterality: N/A;   TRANSCATHETER AORTIC VALVE REPLACEMENT, TRANSFEMORAL N/A 06/22/2016   Procedure: TRANSCATHETER AORTIC VALVE REPLACEMENT, TRANSFEMORAL;  Surgeon: Tonny Bollman, MD;  Location: [facility] OR;  Service: Open Heart Surgery: Richard Simon 3 29 mm biprosthetic walve.   TRANSTHORACIC ECHOCARDIOGRAM  06/2016   Post TAVR:  EF 40-45% with diffuse hypokinesis. High LV filling pressures. Well-seated Edwards's Sapien 3 TAVR bioprosthesis.  - AVA ~1.5. PA pressures now estimated 43 mmHg.   TRANSTHORACIC ECHOCARDIOGRAM  04/2019   EF 60-65.  Normal wall motion with exception of abnormal postoperative septal motion. -- 29 mm Edwards Sapien 3 TAVR. 06/22/2016 w/o paravalvular leak or significant aortic stenosis--normal functioning valve.  Gradients are stable compared to prior echo (mean gradient 8 mmHg).  Mild RV enlargement.  Mild to moderate RA and mild LA enlargement.     TRANSTHORACIC ECHOCARDIOGRAM  06/2014   EF 60-65%. Mod-Severe AS, AVA~0.69, Mn-Pk gradient 32 mmHg -- 55 mmHg, severe LA dilation, moderate-severe RA dilation: Progression of disease    Current Medications: No outpatient medications have been marked as taking for the 04/17/23 encounter (Appointment) with Richard Duster, PA.     Allergies:   No known allergies   Social History   Socioeconomic History   Marital status: Married    Spouse name: Not on file   Number of children: Not on file   Years of education: Not on file   Highest education level: Not on file  Occupational History   Not on file  Tobacco Use   Smoking status: Former    Current packs/day: 0.00    Types: Cigarettes    Quit date: 02/12/1975    Years since quitting: 48.1   Smokeless tobacco: Never  Vaping Use   Vaping status: Never Used  Substance and Sexual Activity   Alcohol use: Yes    Alcohol/week: 14.0 standard  drinks of alcohol    Types: 14 Glasses of wine per week   Drug use: No   Sexual activity: Not on file  Other Topics Concern   Not on file  Social History Narrative   Married. Father of 5, grandfather 74, great-grandfather 3.   Very active, but without routine exercise. Does yard work and intermittent walking, but nothing routine.   Does not smoke. Occasional alcohol.   Social Drivers of Corporate investment banker Strain: Not on file  Food Insecurity: Not on file  Transportation Needs: Not on file  Physical Activity: Not on file  Stress: Not on file  Social Connections: Unknown (08/30/2021)   Received from [facility]   Social Network    Social Network: Not on file     Family History: The patient's ***family history includes Hip fracture in his mother.  ROS:   Please see the history of present illness.    *** All other systems reviewed and are negative.  EKGs/Labs/Other Studies Reviewed:    The following studies were reviewed today: ***      Recent Labs: 03/29/2023: BUN 14; Creatinine, Ser 0.95; Potassium 4.9; Sodium 137  Recent Lipid Panel  Component Value Date/Time   CHOL 150 12/28/2010 0645   TRIG 45 12/28/2010 0645   HDL 56 12/28/2010 0645   CHOLHDL 2.7 12/28/2010 0645   VLDL 9 12/28/2010 0645   LDLCALC 85 12/28/2010 0645     Risk Assessment/Calculations:   {Does this patient have ATRIAL FIBRILLATION?:[phone]}  No BP recorded.  {Refresh Note OR Click here to enter BP  :1}***         Physical Exam:    VS:  There were no vitals taken for this visit.    Wt Readings from Last 3 Encounters:  03/29/23 196 lb 6.4 oz (89.1 kg)  02/27/22 188 lb 12.8 oz (85.6 kg)  05/07/19 185 lb 9.6 oz (84.2 kg)     GEN: *** Well nourished, well developed in no acute distress HEENT: Normal NECK: No JVD; No carotid bruits LYMPHATICS: No lymphadenopathy CARDIAC: ***RRR, no murmurs, rubs, gallops RESPIRATORY:  Clear to auscultation without rales,  wheezing or rhonchi  ABDOMEN: Soft, non-tender, non-distended MUSCULOSKELETAL:  No edema; No deformity  SKIN: Warm and dry NEUROLOGIC:  Alert and oriented x 3 PSYCHIATRIC:  Normal affect   ASSESSMENT:    No diagnosis found. PLAN:    In order of problems listed above:  ***      {Are you ordering a CV Procedure (e.g. stress test, cath, DCCV, TEE, etc)?   Press F2        :[id number]}    Medication Adjustments/Labs and Tests Ordered: Current medicines are reviewed at length with the patient today.  Concerns regarding medicines are outlined above.  No orders of the defined types were placed in this encounter.  No orders of the defined types were placed in this encounter.   There are no Patient Instructions on file for this visit.   Signed, Richard Simon, [place]  04/10/2023 2:52 PM    Cecil Simon

## 2023-04-17 ENCOUNTER — Ambulatory Visit: Payer: Medicare HMO | Admitting: Physician Assistant

## 2023-05-18 ENCOUNTER — Ambulatory Visit: Payer: Medicare HMO | Admitting: Cardiology

## 2023-08-22 DIAGNOSIS — I499 Cardiac arrhythmia, unspecified: Secondary | ICD-10-CM | POA: Diagnosis not present

## 2023-08-22 DIAGNOSIS — R000 Tachycardia, unspecified: Secondary | ICD-10-CM | POA: Diagnosis not present

## 2023-08-22 DIAGNOSIS — R001 Bradycardia, unspecified: Secondary | ICD-10-CM | POA: Diagnosis not present

## 2023-08-22 DIAGNOSIS — I469 Cardiac arrest, cause unspecified: Secondary | ICD-10-CM | POA: Diagnosis not present

## 2023-09-05 ENCOUNTER — Other Ambulatory Visit: Payer: Self-pay | Admitting: Cardiology
